# Patient Record
Sex: Male | Born: 1945
Health system: Southern US, Community
[De-identification: ages and names within clinical notes are randomized; demographics above are authoritative.]

## PROBLEM LIST (undated history)

## (undated) DIAGNOSIS — H35039 Hypertensive retinopathy, unspecified eye: Secondary | ICD-10-CM

## (undated) DIAGNOSIS — I739 Peripheral vascular disease, unspecified: Secondary | ICD-10-CM

## (undated) DIAGNOSIS — K449 Diaphragmatic hernia without obstruction or gangrene: Secondary | ICD-10-CM

## (undated) DIAGNOSIS — N183 Chronic kidney disease, stage 3 unspecified: Secondary | ICD-10-CM

## (undated) DIAGNOSIS — E782 Mixed hyperlipidemia: Secondary | ICD-10-CM

## (undated) DIAGNOSIS — K08109 Complete loss of teeth, unspecified cause, unspecified class: Secondary | ICD-10-CM

## (undated) DIAGNOSIS — H269 Unspecified cataract: Secondary | ICD-10-CM

## (undated) DIAGNOSIS — C801 Malignant (primary) neoplasm, unspecified: Secondary | ICD-10-CM

## (undated) DIAGNOSIS — R351 Nocturia: Secondary | ICD-10-CM

## (undated) DIAGNOSIS — I10 Essential (primary) hypertension: Secondary | ICD-10-CM

## (undated) DIAGNOSIS — C61 Malignant neoplasm of prostate: Secondary | ICD-10-CM

## (undated) DIAGNOSIS — I2510 Atherosclerotic heart disease of native coronary artery without angina pectoris: Secondary | ICD-10-CM

## (undated) DIAGNOSIS — E119 Type 2 diabetes mellitus without complications: Secondary | ICD-10-CM

## (undated) DIAGNOSIS — E785 Hyperlipidemia, unspecified: Secondary | ICD-10-CM

## (undated) HISTORY — DX: Hypertensive retinopathy, unspecified eye: H35.039

## (undated) HISTORY — DX: Atherosclerotic heart disease of native coronary artery without angina pectoris: I25.10

## (undated) HISTORY — PX: EYE SURGERY: SHX253

## (undated) HISTORY — DX: Essential (primary) hypertension: I10

## (undated) HISTORY — PX: CATARACT EXTRACTION: SUR2

## (undated) HISTORY — PX: CORONARY ARTERY BYPASS GRAFT: SHX141

## (undated) HISTORY — DX: Unspecified cataract: H26.9

## (undated) HISTORY — PX: APPENDECTOMY: SHX54

## (undated) HISTORY — PX: CARDIAC CATHETERIZATION: SHX172

## (undated) HISTORY — PX: VENTRAL HERNIA REPAIR: SHX424

## (undated) HISTORY — PX: CORONARY ANGIOPLASTY WITH STENT PLACEMENT: SHX49

## (undated) HISTORY — DX: Hyperlipidemia, unspecified: E78.5

---
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
## 1972-01-14 HISTORY — PX: INGUINAL HERNIA REPAIR: SUR1180

## 1972-01-14 HISTORY — PX: HERNIA REPAIR: SHX51

## 1997-08-13 DIAGNOSIS — Z955 Presence of coronary angioplasty implant and graft: Secondary | ICD-10-CM

## 1997-08-13 HISTORY — DX: Presence of coronary angioplasty implant and graft: Z95.5

## 1997-09-11 ENCOUNTER — Inpatient Hospital Stay (HOSPITAL_COMMUNITY): Admission: AD | Admit: 1997-09-11 | Discharge: 1997-09-13 | Payer: Self-pay | Admitting: Cardiology

## 1998-01-13 HISTORY — PX: CORONARY ARTERY BYPASS GRAFT: SHX141

## 1998-02-05 ENCOUNTER — Encounter: Payer: Self-pay | Admitting: Emergency Medicine

## 1998-02-05 ENCOUNTER — Inpatient Hospital Stay (HOSPITAL_COMMUNITY): Admission: EM | Admit: 1998-02-05 | Discharge: 1998-02-07 | Payer: Self-pay | Admitting: Emergency Medicine

## 1998-02-06 ENCOUNTER — Encounter: Payer: Self-pay | Admitting: Cardiology

## 1998-05-14 DIAGNOSIS — I252 Old myocardial infarction: Secondary | ICD-10-CM

## 1998-05-14 HISTORY — DX: Old myocardial infarction: I25.2

## 1998-06-02 ENCOUNTER — Encounter: Payer: Self-pay | Admitting: Emergency Medicine

## 1998-06-02 ENCOUNTER — Inpatient Hospital Stay (HOSPITAL_COMMUNITY): Admission: EM | Admit: 1998-06-02 | Discharge: 1998-06-10 | Payer: Self-pay | Admitting: Emergency Medicine

## 1998-06-05 ENCOUNTER — Encounter: Payer: Self-pay | Admitting: *Deleted

## 1998-06-06 ENCOUNTER — Encounter: Payer: Self-pay | Admitting: *Deleted

## 1998-06-06 DIAGNOSIS — Z951 Presence of aortocoronary bypass graft: Secondary | ICD-10-CM

## 1998-06-06 HISTORY — DX: Presence of aortocoronary bypass graft: Z95.1

## 1998-06-07 ENCOUNTER — Encounter: Payer: Self-pay | Admitting: Cardiothoracic Surgery

## 1998-06-08 ENCOUNTER — Encounter: Payer: Self-pay | Admitting: Cardiothoracic Surgery

## 1998-09-18 ENCOUNTER — Encounter: Payer: Self-pay | Admitting: Cardiothoracic Surgery

## 1998-09-18 ENCOUNTER — Inpatient Hospital Stay (HOSPITAL_COMMUNITY): Admission: RE | Admit: 1998-09-18 | Discharge: 1998-09-19 | Payer: Self-pay | Admitting: Cardiothoracic Surgery

## 1998-10-23 ENCOUNTER — Encounter: Payer: Self-pay | Admitting: Emergency Medicine

## 1998-10-23 ENCOUNTER — Inpatient Hospital Stay (HOSPITAL_COMMUNITY): Admission: EM | Admit: 1998-10-23 | Discharge: 1998-10-26 | Payer: Self-pay | Admitting: Emergency Medicine

## 1998-10-25 ENCOUNTER — Encounter: Payer: Self-pay | Admitting: *Deleted

## 1999-05-31 ENCOUNTER — Emergency Department (HOSPITAL_COMMUNITY): Admission: EM | Admit: 1999-05-31 | Discharge: 1999-05-31 | Payer: Self-pay

## 1999-05-31 ENCOUNTER — Encounter: Payer: Self-pay | Admitting: Emergency Medicine

## 1999-07-02 ENCOUNTER — Ambulatory Visit (HOSPITAL_COMMUNITY): Admission: RE | Admit: 1999-07-02 | Discharge: 1999-07-03 | Payer: Self-pay | Admitting: Cardiothoracic Surgery

## 1999-09-20 ENCOUNTER — Emergency Department (HOSPITAL_COMMUNITY): Admission: EM | Admit: 1999-09-20 | Discharge: 1999-09-20 | Payer: Self-pay | Admitting: Emergency Medicine

## 2000-08-30 ENCOUNTER — Inpatient Hospital Stay (HOSPITAL_COMMUNITY): Admission: EM | Admit: 2000-08-30 | Discharge: 2000-09-02 | Payer: Self-pay

## 2003-01-14 HISTORY — PX: CARDIAC CATHETERIZATION: SHX172

## 2003-04-15 ENCOUNTER — Inpatient Hospital Stay (HOSPITAL_COMMUNITY): Admission: EM | Admit: 2003-04-15 | Discharge: 2003-04-16 | Payer: Self-pay | Admitting: Emergency Medicine

## 2003-04-21 ENCOUNTER — Ambulatory Visit (HOSPITAL_COMMUNITY): Admission: RE | Admit: 2003-04-21 | Discharge: 2003-04-21 | Payer: Self-pay | Admitting: *Deleted

## 2003-11-16 ENCOUNTER — Ambulatory Visit: Payer: Self-pay | Admitting: Internal Medicine

## 2004-05-16 ENCOUNTER — Emergency Department (HOSPITAL_COMMUNITY): Admission: EM | Admit: 2004-05-16 | Discharge: 2004-05-16 | Payer: Self-pay | Admitting: Emergency Medicine

## 2004-10-02 ENCOUNTER — Ambulatory Visit: Payer: Self-pay | Admitting: Cardiology

## 2005-09-26 ENCOUNTER — Ambulatory Visit: Payer: Self-pay | Admitting: Cardiology

## 2006-05-19 ENCOUNTER — Observation Stay (HOSPITAL_COMMUNITY): Admission: EM | Admit: 2006-05-19 | Discharge: 2006-05-20 | Payer: Self-pay | Admitting: Emergency Medicine

## 2006-10-08 ENCOUNTER — Ambulatory Visit: Payer: Self-pay | Admitting: Cardiology

## 2007-09-13 ENCOUNTER — Ambulatory Visit: Payer: Self-pay | Admitting: Cardiology

## 2007-09-13 ENCOUNTER — Ambulatory Visit: Payer: Self-pay

## 2008-10-26 ENCOUNTER — Ambulatory Visit: Payer: Self-pay | Admitting: Cardiology

## 2008-10-26 DIAGNOSIS — I10 Essential (primary) hypertension: Secondary | ICD-10-CM | POA: Insufficient documentation

## 2008-10-26 DIAGNOSIS — I25810 Atherosclerosis of coronary artery bypass graft(s) without angina pectoris: Secondary | ICD-10-CM | POA: Insufficient documentation

## 2008-10-30 ENCOUNTER — Telehealth: Payer: Self-pay | Admitting: Cardiology

## 2008-10-31 ENCOUNTER — Telehealth: Payer: Self-pay | Admitting: Cardiology

## 2009-01-18 ENCOUNTER — Emergency Department (HOSPITAL_COMMUNITY): Admission: EM | Admit: 2009-01-18 | Discharge: 2009-01-18 | Payer: Self-pay | Admitting: Emergency Medicine

## 2009-01-18 IMAGING — CR DG CHEST 2V
2 series · 2 of 2 positions shown · non-contrast
Comparison: [DATE]

CLINICAL DATA: Chest pain.  Arrhythmia.

CHEST - 2 VIEW

[w chest pa]
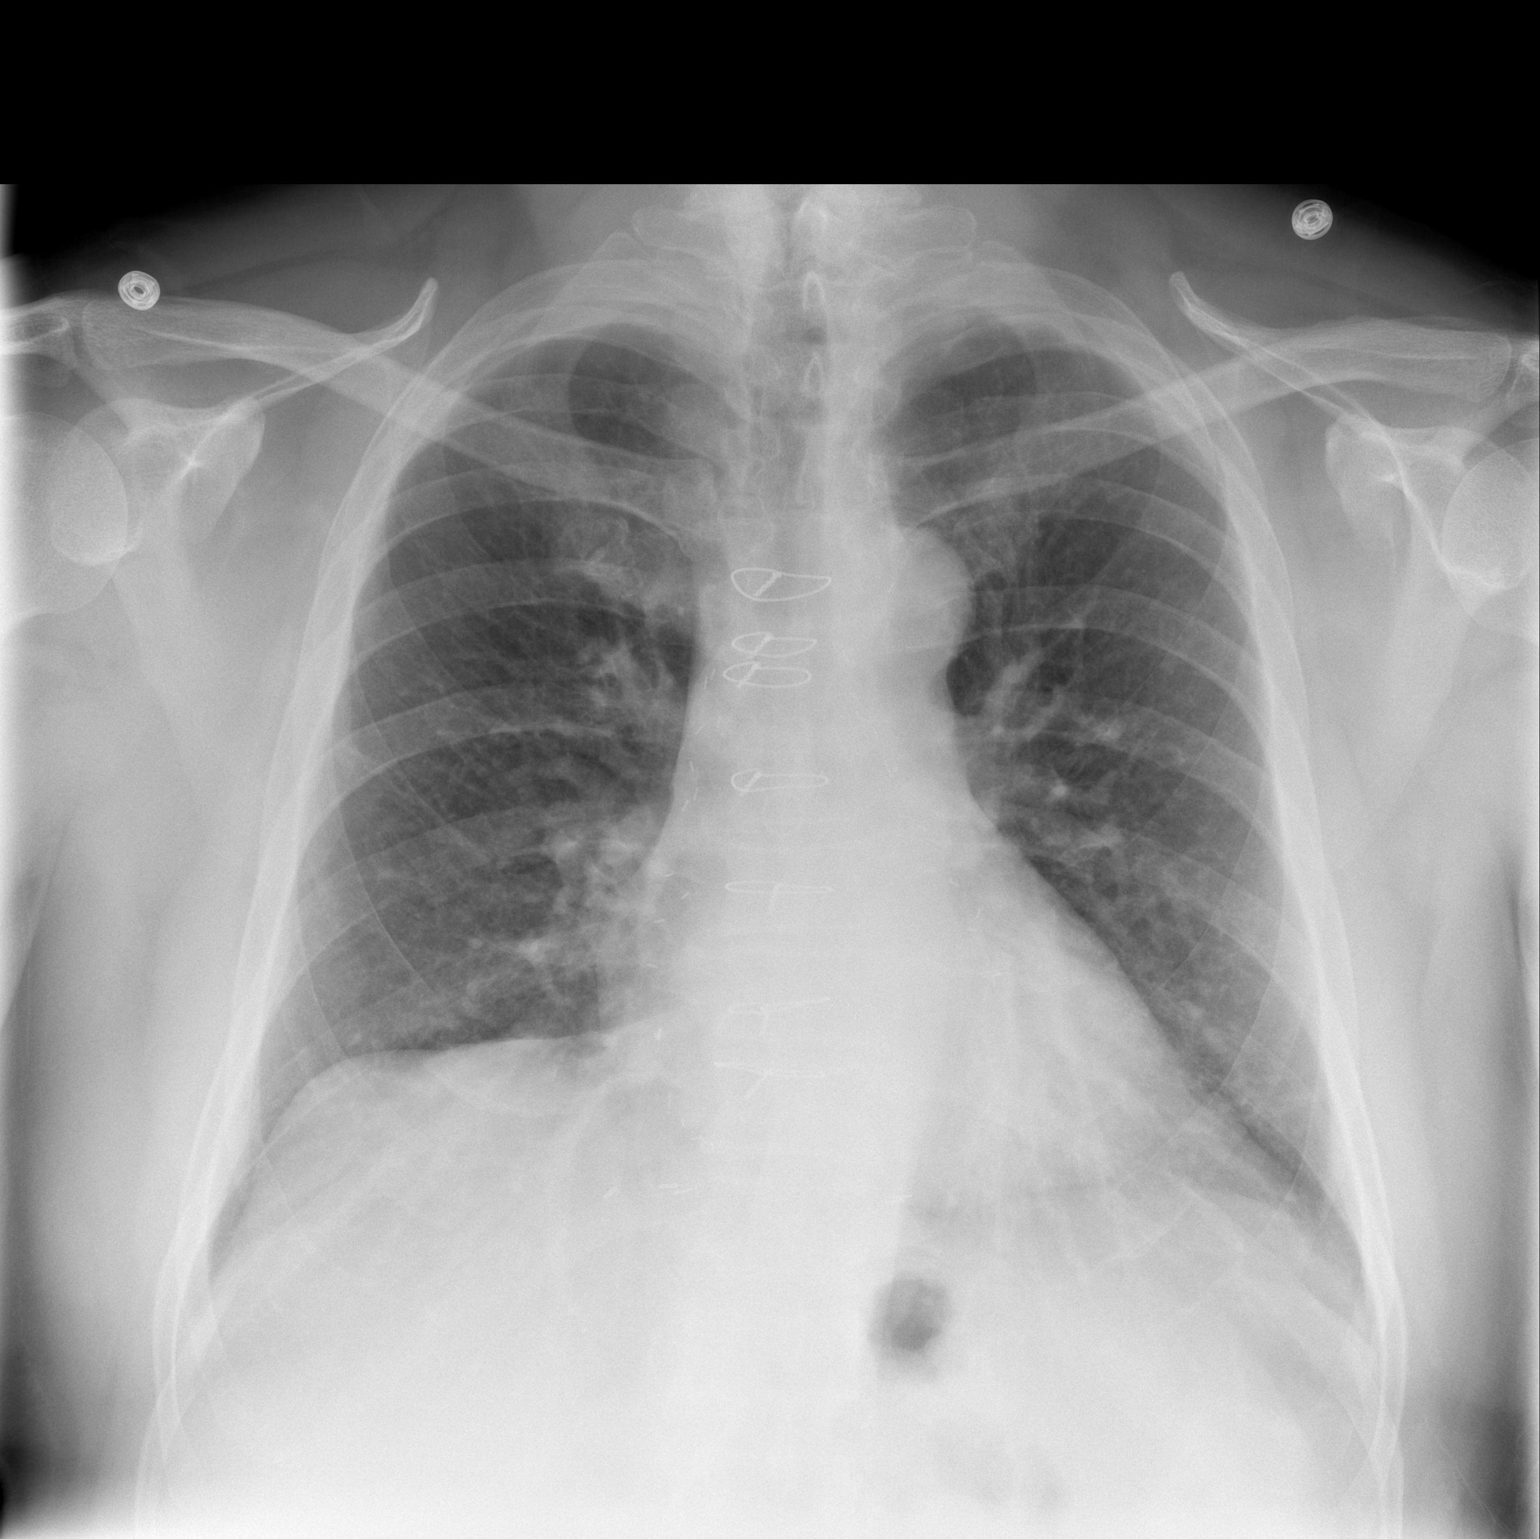

[w chest lat]
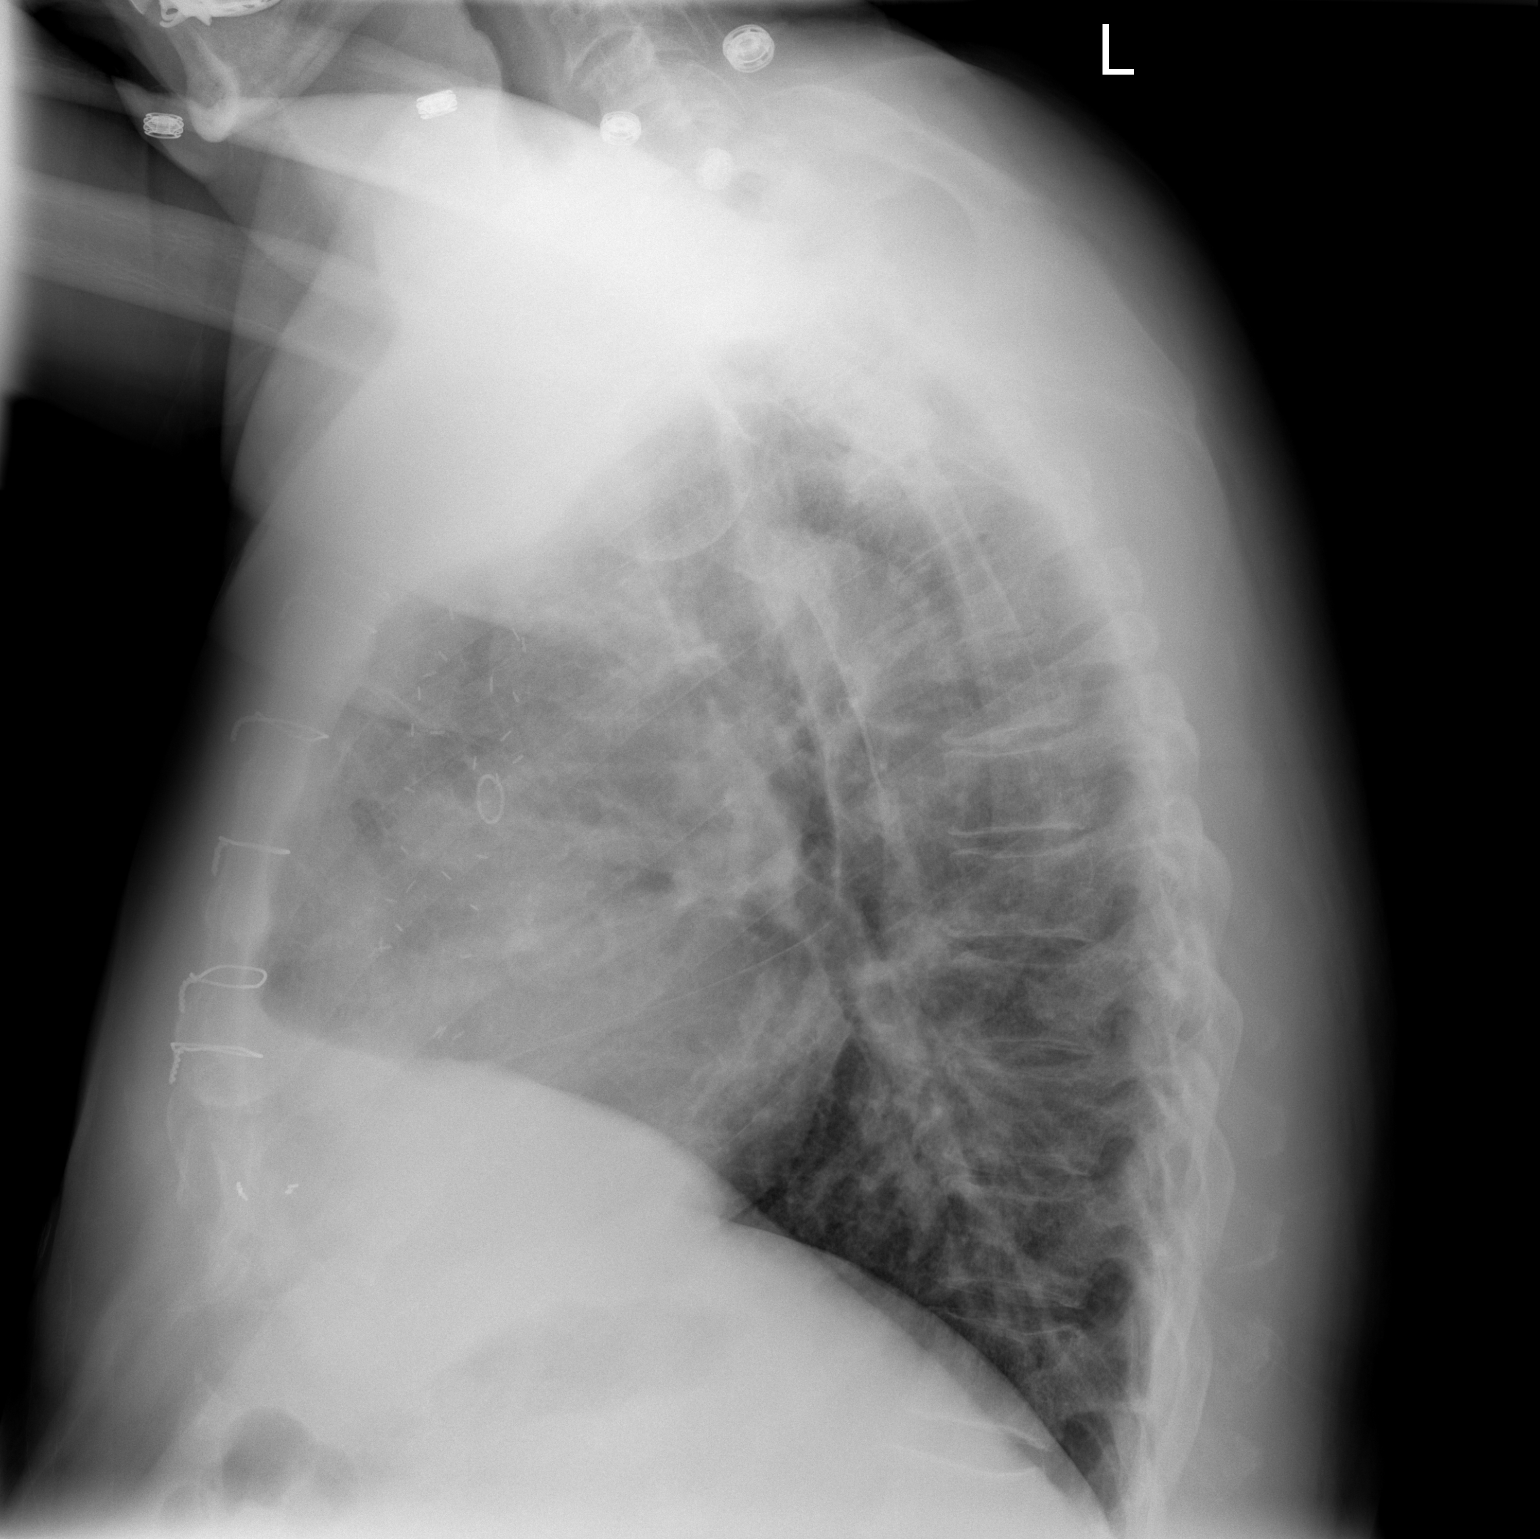

[2 of 2 positions shown; findings below may reference images not displayed]

FINDINGS: There is been previous median sternotomy and CABG.  Heart
size is at the upper limits of normal.  No sign of heart failure.
No infiltrate, mass, effusion or collapse.  No significant bony
finding.
IMPRESSION: Previous CABG.  No active disease.

## 2009-01-19 ENCOUNTER — Telehealth: Payer: Self-pay | Admitting: Cardiology

## 2009-01-22 ENCOUNTER — Encounter (INDEPENDENT_AMBULATORY_CARE_PROVIDER_SITE_OTHER): Payer: Self-pay | Admitting: *Deleted

## 2009-04-23 ENCOUNTER — Encounter: Payer: Self-pay | Admitting: Cardiology

## 2009-04-27 ENCOUNTER — Telehealth: Payer: Self-pay | Admitting: Cardiology

## 2009-11-05 ENCOUNTER — Ambulatory Visit: Payer: Self-pay | Admitting: Cardiology

## 2009-11-19 ENCOUNTER — Telehealth: Payer: Self-pay | Admitting: Cardiology

## 2010-02-12 NOTE — Progress Notes (Signed)
Summary: pt wants a sooner appt at gastro   Phone Note Call from Patient Call back at Home Phone [phone]   Caller: Patient Reason for Call: Talk to Nurse, Talk to Doctor Summary of Call: pt was seen yesterday at ER for chest pain but they told him he didn't have a heart attack but he needs to see a Laurette Schimke doc but the appt is not until Feb and he wants something sooner is there any way our office can help Initial call taken by: Omer Jack,  January 19, 2009 12:04 PM  Follow-up for Phone Call        Line busy.  Minerva Areola, RN, BSN  January 19, 2009 2:48 PM  Follow-up by: Minerva Areola, RN, BSN,  January 19, 2009 2:48 PM  Additional Follow-up for Phone Call Additional follow up Details #1::        Tried this number twice.  Both times a message came on saying this number [phone]) [phone] was no longer in service.   Minerva Areola, RN, BSN  January 19, 2009 2:59 PM  Additional Follow-up by: Minerva Areola, RN, BSN,  January 19, 2009 2:59 PM

## 2010-02-12 NOTE — Progress Notes (Signed)
Summary: Questions about labs and medications    Phone Note Call from Patient Call back at Home Phone [phone]   Caller: Patient Summary of Call: Pt calling regarding his  labs and medication from Dr.Moore office Initial call taken by: Judie Grieve,  April 27, 2009 4:49 PM  Follow-up for Phone Call        [facility]. Sherri Rad, RN, BSN  April 30, 2009 9:51 AM  I spoke with the pt. He states Dr. Kathi Der office had contacted him about his lipid panel. He was told his HDL was low and that he needed to start on something for this. I explained to the pt that we had received labs from 4/11 from Dr. Kathi Der office, but we did not have a lipid panel. I explained I would call [facility] for these results. I have called their office and left a message for medical records to call me and let me know when they are sending these results.  Follow-up by: Sherri Rad, RN, BSN,  April 30, 2009 4:06 PM  Additional Follow-up for Phone Call Additional follow up Details #1::        I received the pt's lipids from Dr. Kathi Der office. His HDL is 29. I have discussed this with Dr. Juanda Chance. He states that he supports the pt starting on something to help treat his HDL. We will leave that to Dr. Christell Constant. The pt is aware.  Additional Follow-up by: Sherri Rad, RN, BSN,  April 30, 2009 6:44 PM

## 2010-02-12 NOTE — Letter (Signed)
Summary: New Patient letter  [facility]  [address] [place], [place] [address]   Phone: [phone]  Fax: [phone]       01/22/2009 MRN: [id number]  Jacob Rios [address] [place], [place]  [address]  Dear Jacob Rios,  Welcome to the Gastroenterology Division at [facility].    You are scheduled to see Dr. Christella Hartigan on 02-06-09 at 9:00a.m. on the 3rd floor at [facility], [address]  We ask that you try to arrive at our office 15 minutes prior to your appointment time to allow for check-in.  We would like you to complete the enclosed self-administered evaluation form prior to your visit and bring it with you on the day of your appointment.  We will review it with you.  Also, please bring a complete list of all your medications or, if you prefer, bring the medication bottles and we will list them.  Please bring your insurance card so that we may make a copy of it.  If your insurance requires a referral to see a specialist, please bring your referral form from your primary care physician.  Co-payments are due at the time of your visit and may be paid by cash, check or credit card.     Your office visit will consist of a consult with your physician (includes a physical exam), any laboratory testing he/she may order, scheduling of any necessary diagnostic testing (e.g. x-ray, ultrasound, CT-scan), and scheduling of a procedure (e.g. Endoscopy, Colonoscopy) if required.  Please allow enough time on your schedule to allow for any/all of these possibilities.    If you cannot keep your appointment, please call [phone] to cancel or reschedule prior to your appointment date.  This allows [place] the opportunity to schedule an appointment for another patient in need of care.  If you do not cancel or reschedule by 5 p.m. the business day prior to your appointment date, you will be charged a $50.00 late cancellation/no-show fee.    Thank you for choosing [facility] for your medical needs.  We appreciate the opportunity to care for you.  Please visit [place] at our website  to learn more about our practice.                     Sincerely,                                                             The Gastroenterology Division

## 2010-02-12 NOTE — Assessment & Plan Note (Signed)
Summary: f1y  Medications Added ASPIRIN EC 325 MG TBEC (ASPIRIN) Take one tablet by mouth daily METOPROLOL SUCCINATE 100 MG XR24H-TAB (METOPROLOL SUCCINATE) Take one half tablet by mouth daily JANUVIA 50 MG TABS (SITAGLIPTIN PHOSPHATE) please take one tablet by mouth three times a week. VITAMIN C 500 MG TABS (ASCORBIC ACID) please take one tablet by mouth daily        Primary Provider:  Dr Christell Constant   History of Present Illness: Jacob Rios is 65 years old and return for management of CAD.  in 2000 had a microinfarction with subsequent bypass surgery. He's done quite well since that time has had no recent chest pain shortness of breath or palpitations.  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 He discontinued cigarettes in 2000.      Current Medications (verified): 1)  Lipitor 40 Mg Tabs (Atorvastatin Calcium) .... Take One Tablet By Mouth Daily. 2)  Fish Oil 1,000mg  .... 1 Tab Once Daily 3)  Aspirin Ec 325 Mg Tbec (Aspirin) .... Take One Tablet By Mouth Daily 4)  Metoprolol Succinate 100 Mg Xr24h-Tab (Metoprolol Succinate) .... Take One Half Tablet By Mouth Daily 5)  Glipizide 10 Mg Tabs (Glipizide) .Marland Kitchen.. 1 Tab Once Daily 6)  Januvia 50 Mg Tabs (Sitagliptin Phosphate) .... Please Take One Tablet By Mouth Three Times A Week. 7)  Vitamin C 500 Mg Tabs (Ascorbic Acid) .... Please Take One Tablet By Mouth Daily  Allergies: 1)  ! Morphine  Past History:  Past Medical History: Reviewed history from 10/26/2008 and no changes required.  PAST MEDICAL HISTORY:  1. Hypertension.  2. Hyperlipidemia  3. Diabetes. 4.  S/P CABG 2000  Review of Systems       ROS is negative except as outlined in HPI.   Vital Signs:  Patient profile:   65 year old male Height:      71 inches Weight:      260.50 pounds BP sitting:   132 / 72  Physical Exam  Additional Exam:  Gen.  Well-nourished, in no distress   Neck: No JVD, thyroid not enlarged, no carotid bruits Lungs: No tachypnea, clear without rales, rhonchi or wheezes Cardiovascular: Rhythm regular, PMI not displaced,  heart sounds  normal, no murmurs or gallops, no peripheral edema, pulses normal in all 4 extremities. Abdomen: BS normal, abdomen soft and non-tender without masses or organomegaly, no hepatosplenomegaly. MS: No deformities, no cyanosis or clubbing   Neuro:  No focal sns   Skin:  no lesions    Impression & Recommendations:  Problem # 1:  CAD, AUTOLOGOUS BYPASS GRAFT (ICD-414.02) He had bypass surgery in 2000. He's had no recent chest pain and this problem appears stable. His updated medication list for this problem includes:    Aspirin Ec 325 Mg Tbec (Aspirin) .Marland Kitchen... Take one tablet by mouth daily    Metoprolol Succinate 100 Mg Xr24h-tab (Metoprolol succinate) .Marland Kitchen... Take one half tablet by mouth daily  Orders: EKG w/ Interpretation (93000)  Problem # 2:  HYPERTENSION, BENIGN (ICD-401.1) This is well controlled on current medications. His updated medication list for this problem includes:    Aspirin Ec 325 Mg Tbec (Aspirin) .Marland Kitchen... Take one tablet by mouth daily    Metoprolol Succinate 100 Mg Xr24h-tab (Metoprolol succinate) .Marland Kitchen... Take one half tablet by mouth daily  Problem # 3:  HYPERLIPIDEMIA-MIXED (ICD-272.4) He had a recent lipid profile in Dr. Kathi Der office. His  total cholesterol and LDL were good but his HDL was 29. His weight is 260 pounds which is down somewhat from last year but still high. He is fairly active. We encouraged continued weight reduction. His updated medication list for this problem includes:    Lipitor 40 Mg Tabs (Atorvastatin calcium) .Marland Kitchen... Take one tablet by mouth daily.  Patient Instructions: 1)  Your physician recommends that you continue on your current medications as directed. Please refer to the Current Medication list given to you today. 2)  Your physician  wants you to follow-up in: 1 year with Dr. Clifton James.  You will receive a reminder letter in the mail two months in advance. If you don't receive a letter, please call our office to schedule the follow-up appointment. Prescriptions: JANUVIA 50 MG TABS (SITAGLIPTIN PHOSPHATE) please take one tablet by mouth three times a week.  #30 x 3   Entered by:   Sherri Rad, RN, BSN   Authorized by:   Lenoria Farrier, MD, [facility]   Signed by:   Sherri Rad, RN, BSN on 11/05/2009   Method used:   [facility] to        [facility] [phone]* (retail)       [address] [place].       [place], [place]  [address]       Ph: [phone] or [phone]       Fax: [phone]   RxID:   [id number] GLIPIZIDE 10 MG TABS (GLIPIZIDE) 1 tab once daily  #30 x 3   Entered by:   Sherri Rad, RN, BSN   Authorized by:   Lenoria Farrier, MD, [facility]   Signed by:   Sherri Rad, RN, BSN on 11/05/2009   Method used:   Electronically to        [facility] [phone]* (retail)       [address] [place].       [place], [place]  [address]       Ph: [phone] or [phone]       Fax: [phone]   RxID:   [id number] METOPROLOL SUCCINATE 100 MG XR24H-TAB (METOPROLOL SUCCINATE) Take one half tablet by mouth daily  #30 x 11   Entered by:   Sherri Rad, RN, BSN   Authorized by:   Lenoria Farrier, MD, [facility]   Signed by:   Sherri Rad, RN, BSN on 11/05/2009   Method used:   Electronically to        [facility]. [phone]* (retail)       [address] [place].       [place], [place]  [address]       Ph: [phone] or [phone]       Fax: [phone]   RxID:   [id number] LIPITOR 40 MG TABS (ATORVASTATIN CALCIUM) Take one tablet by mouth daily.  #30 x 11   Entered by:   Sherri Rad, RN, BSN   Authorized by:   Lenoria Farrier, MD, [facility]   Signed by:   Sherri Rad, RN, BSN on 11/05/2009   Method used:   Electronically to        [facility]. [phone]* (retail)       [address] [place].       [place],  [place]  [address]       Ph: [phone] or [phone]       Fax: [phone]   RxID:   [id number]

## 2010-02-12 NOTE — Progress Notes (Signed)
Summary: QUESTION ABOUT MEDICATION  Medications Added GLIPIZIDE XL 10 MG XR24H-TAB (GLIPIZIDE) take one tablet by mouth once daily       Phone Note From Pharmacy Call back at [phone]   [facility]: [facility] [phone]* Summary of Call: PHARMACY HAVE QUESTIONS ABOUT MEDICATION GLIPIZIDE Initial call taken by: Judie Grieve,  November 19, 2009 2:09 PM  Follow-up for Phone Call        I called and spoke with the pharmacy. They stated he had been getting the Glipizide XL and we sent in the short acting. I explained that we normally don't fill this for him. I gave the ok to fill what he normally has been getting. I will correct this in his chart. Follow-up by: Sherri Rad, RN, BSN,  November 19, 2009 2:25 PM    New/Updated Medications: GLIPIZIDE XL 10 MG XR24H-TAB (GLIPIZIDE) take one tablet by mouth once daily

## 2010-03-30 LAB — POCT I-STAT, CHEM 8
BUN: 19 mg/dL (ref 6–23)
Creatinine, Ser: 1.1 mg/dL (ref 0.4–1.5)
Hemoglobin: 15.6 g/dL (ref 13.0–17.0)
Potassium: 4.8 mEq/L (ref 3.5–5.1)
Sodium: 134 mEq/L — ABNORMAL LOW (ref 135–145)
TCO2: 28 mmol/L (ref 0–100)

## 2010-03-30 LAB — POCT CARDIAC MARKERS: Troponin i, poc: <0.05 ng/mL (ref 0.00–0.09)

## 2010-03-30 LAB — GLUCOSE, CAPILLARY: Glucose-Capillary: 225 mg/dL — ABNORMAL HIGH (ref 70–99)

## 2010-05-28 NOTE — Assessment & Plan Note (Signed)
[facility]                            CARDIOLOGY OFFICE NOTE   Jacob Rios, Jacob Rios                       MRN:          [id number]  DATE:09/13/2007                            DOB:          1945/07/17    PRIMARY CARE PHYSICIAN:  Jacob Penna, MD in [place].   CLINICAL HISTORY:  [name] is a 65 years old and returned for followup  management of coronary heart disease.  He had prior percutaneous  coronary interventions and had bypass surgery in 2000.  He has done very  well since that time and said he has had no recent chest pain, shortness  breath or palpitations.  He is still working part time as a Ecologist.   His past medical history is significant for diabetes, hypertension,  hyperlipidemia, and excess weight, although he has lost weight over the  last couple of years from 274 down to 264.   His current medications include Lipitor 40 mg, Nexium, aspirin,  glipizide, and Toprol 100 one half tablet daily.   PHYSICAL EXAMINATION:  VITAL SIGNS:  Blood pressure 139/81, the pulse 71  and regular.  NECK:  There was no venous distention.  The carotid pulses were full  without bruits.  CHEST:  Clear.  CARDIAC:  Rhythm was regular.  No murmurs or gallops.  ABDOMEN:  Soft.  No organomegaly.  EXTREMITIES:  Peripheral pulses were full.  There is no peripheral  edema.   Jacob Rios had an adenosine Myoview scan today and his scan now showed no  evidence of ischemia.   IMPRESSION:  1. Coronary artery disease, status post coronary bypass graft surgery      in 2000, stable.  2. Good left ventricular function.  3. Hypertension, treated.  4. Hyperlipidemia with low HDL.  5. Non-insulin dependant diabetes.  6. Excess weight.   RECOMMENDATIONS:  I think, Jacob Rios is doing well from a cardiac  standpoint.  He is making some progress on his weight.  His HDL was low  on his last reading and I talked to him about low carbs and weight  reduction to  help with this as well as exercise.  His LDLs target on  Lipitor and we will continue that.  I will plan to see him back in  followup in a year.     Bruce Elvera Lennox Juanda Chance, MD, [facility]  Electronically Signed   BRB/MedQ  DD: 09/13/2007  DT: 09/14/2007  Job #: [id number]

## 2010-05-28 NOTE — Assessment & Plan Note (Signed)
[facility]                            CARDIOLOGY OFFICE NOTE   [name], Rios                         MRN:          [id number]  DATE:10/08/2006                            DOB:          12/06/1945    PRIMARY CARE PHYSICIAN:  Jacob Rios, M.D.   CLINICAL HISTORY:  [name] is 65 years old and has had prior  percutaneous coronary interventions and had a bypass surgery in 2000.  He has done quite well since that time.  He has had no recent chest  pain, shortness of breath, or palpitations.  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  He has had no palpitations and no  shortness of breath.   PAST MEDICAL HISTORY:  1. Diabetes.  2. Hypertension.  3. Hyperlipidemia.  4. Excess weight gain, most of which occurred right after his surgery      when he stopped smoking.   PHYSICAL EXAMINATION:  NECK:  There was no vein distension.  The carotid  pulses were full without bruits.  CHEST:  Clear.  CARDIAC:  Rhythm was regular.  I could hear no murmurs or gallops.  ABDOMEN:  Soft with normal bowel sounds.  There was no  hepatosplenomegaly.  EXTREMITIES:  Peripheral pulses were full with no peripheral edema.   Electrocardiogram showed small Q wave in III and was otherwise normal,  except for 1 PVC.   IMPRESSION:  1. Coronary artery disease status post prior percutaneous coronary      intervention and status post coronary artery bypass graft surgery      in 2000, now stable.  2. Hyperlipidemia with low high-density lipoprotein.  3. Hypertension, treated.  4. Non-insulin-dependent diabetes.  5. Excess weight.   RECOMMENDATIONS:  I think [name] is doing well from a cardiac  standpoint.  His biggest issue is his excess weight up to 270 pounds.  I  talked to him about a low carb diet and  encouraged him strongly to lose  weight.  I told him to set an initial goal to get down to 250 over the  next 3-6 months and then he can work from there.  It has been 8 years  since bypass  surgery, and we will plan to have an adenosine rest/stress Myoview scan  prior to his next visit.  I will plan to see him back in a year.     Bruce Elvera Lennox Juanda Chance, MD, [facility]  Electronically Signed    BRB/MedQ  DD: 10/08/2006  DT: 10/09/2006  Job #: [id number]

## 2010-05-31 NOTE — Discharge Summary (Signed)
NAME:  Jacob Rios, Jacob Rios                          ACCOUNT NO.:  [id number]   MEDICAL RECORD NO.:  [id number]                   PATIENT TYPE:  INP   LOCATION:  2036                                 FACILITY:  [facility]   PHYSICIAN:  Theodore Demark, P.A. [facility]            DATE OF BIRTH:  [birth year]   DATE OF ADMISSION:  DATE OF DISCHARGE:  04/16/2003                                 DISCHARGE SUMMARY   PROCEDURES:  None.   HOSPITAL COURSE:  Jacob Rios is a 65 year old male with a history of  aortocoronary bypass surgery in 2000.  His last catheterization was in 2002.  At that time, the LIMA was atretic with 50% stenosis in the LAD, and the  RAMI had a 40% stenosis in the distal portion, but was patent to the RCA,  and the SVG to diagonal was patent as well.  Medical therapy was  recommended.  Jacob Rios began having chest pain described as an indigestion  and burning similar to his reflux but also similar to his anginal symptoms.  He took sublingual nitroglycerin and Nexium and got relief of his symptoms.  He was admitted to rule out MI and for further evaluation.   His enzymes were negative for myocardial infarction, and he was scheduled  for an in-hospital Cardiolite on April 16, 2003.  However, he was mistakenly  injected with a mislabeled technetium MAA instead of technetium sestamibi.  Therefore, we were unable to perform the Cardiolite in the hospital.   Dr. Myrtis Ser evaluated Jacob Rios and felt that with resolution of his symptoms,  he could be discharged and have a followup Cardiolite as an outpatient.  Additionally, he had bradycardia with a heart rate that dropped into the  40's occasionally, but this was not sustained.  His heart rate was generally  in the 50's when he was awake, and he was asymptomatic with this, and no  medicine changes were made.  Jacob Rios was considered stable for discharge  on April 16, 2003.   LABORATORY DATA:  Hemoglobin 15.1, hematocrit 44.3, WBC's 5.3, platelets  182, Sodium 138, potassium 4.0, chloride 106.  CO2 25.  BUN 15, creatinine  1.4, glucose 116.   DISCHARGE CONDITION:  Stable.   DISCHARGE DIAGNOSES:  1. Chest pain, enzymes negative for myocardial infarction and Cardiolite as     an outpatient to evaluate for ischemia.  2. Status post myocardial infarction in 1999 with percutaneous intervention.  3. Status post bypass surgery in 2000 with LIMA to LAD and RAMI to RCA and     SVG to diagonal.  4. Status post catheterization in 2002 with RAMI and SVG patent, and the     LIMA was open but atretic.  5. Hyperlipidemia.  6. Gastroesophageal reflux disease.  7. Status post incisional hernia and appendectomy.  8. History of allergies to contrast and morphine.   DISCHARGE INSTRUCTIONS:  1. His activity level is to be  as tolerated.  2. He is to stick to a low-fat diet.  3. He is to get a stress test.  4. Follow up with Dr. Gerri Spore.  The office will call.  5. He is to follow up with Dr. Christell Constant as needed or as scheduled.   DISCHARGE MEDICATIONS:  1. Toprol XL 100 mg daily.  2. Aspirin 325 mg daily.  3. Fish oil daily.  4. Lipitor 40 mg daily.  5. Nitroglycerin p.r.n.                                                Theodore Demark, P.A. [facility]    RB/MEDQ  D:  04/16/2003  T:  04/17/2003  Job:  [id number]   cc:   Ernestina Penna, M.D.  [address] [place]  [place] [address]  Fax: [phone]   Carole Binning, M.D. [facility]

## 2010-05-31 NOTE — Op Note (Signed)
[place]  Patient:    Jacob Rios, Jacob Rios                       MRN: [id number] Proc. Date: 07/02/99 Adm. Date:  [id number] Disc. Date: [id number] Attending:  Waldo Laine CC:         Gwenith Daily. Tyrone Sage, M.D.                           Operative Report  PREOPERATIVE DIAGNOSIS:  Recurrent ventral hernia repair, status post coronary artery bypass grafting.  POSTOPERATIVE DIAGNOSIS:  Recurrent ventral hernia repair, status post coronary artery bypass grafting.  SURGICAL REPAIR:  Repair of ventral hernia with polypropylene mesh.  SURGEON:  Gwenith Daily. Tyrone Sage, M.D.  BRIEF HISTORY:  The patient is a 65 year old male who approximately one year ago underwent coronary artery bypass grafting with bilateral mammaries.  He has done well from a cardiac standpoint recently without recurrent angina.  In the fall of 2000, he developed a ventral hernia at the base of his sternal incision.  A repair at that time was done primarily.  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  There had been a question of slight recurrence of the hernia prior to this, but after the lawn mower accident, this became a much more prominent bulge at the lower end of his sternal incision with increasing discomfort and tenderness.  On physical examination, an easily palpable defect was appreciated, and repair was recommended.  DESCRIPTION OF PROCEDURE:  The patient was underwent general endotracheal anesthesia without incident.  The skin of the abdomen and lower chest was prepped with Betadine and draped in the usual sterile manner.  The previous incision over the lower sternum and upper abdomen was opened, and dissection was carried down to the fascial layers.  There was an easily identifiable inch in size defect just slightly to the right of the midline and below the xiphoid.  This area was freed up to identify fascial layers.  A  polypropylene mesh was selected in a double layer, was secured to the fascia to close the defect with interrupted Prolene sutures.  Vicryl 0 sutures were then used to reapproximate the subcutaneous tissue and a 4-0 subcuticular stitch in the skin, and dry dressings were applied.  The patient was extubated in the operating room and transferred to the recovery room, having tolerated the procedure without obvious complications.  Blood loss was minimal. DD:  07/02/99 TD:  07/04/99 Job: [id number]

## 2010-05-31 NOTE — H&P (Signed)
[facility]  Patient:    Jacob Rios, Jacob Rios                       MRN: [id number] Adm. Date:  [id number] Disc. Date: [id number] Attending:  Trauma, Md Dictator:   Marlowe Kays, P.A.                         History and Physical  CHIEF COMPLAINT:  Femoral/popliteal occlusive disease with claudication symptoms.  HISTORY OF PRESENT ILLNESS:  This 65 year old white male referred by Dr. Titus Dubin. Hopper for evaluation of bilateral lower extremity claudication.  The symptoms appeared three months ago.  These symptoms are now increasingly worse, more pronounced on the left than on the right.  Accompanying symptoms are night pain, numbness, and tingling on the feet, more pronounced on the left than on the right. Severe claudication symptoms appear after he walks about 100 yards.  No rest pain or nonhealing ulcers.  Dopplers at the office revealed severe occlusive disease in both lower extremities, with calcified vessels.  Also, he has evidence of left superficial femoral, versus popliteal occlusive disease, as well as bilateral tibial disease.  The patient will be admitted on June 29, 1999, to [facility] for heparinization, for the patient is off of Coumadin since  June 27, 1999, and he has a history of valve replacement.  He will undergo an arteriogram on July 01, 1999, and possibly a left femoral/popliteal bypass graft on July 02, 1999.  PAST MEDICAL HISTORY: 1. Coronary artery disease, status post CABG, valve replacement. 2. PVOD, claudication. 3. Chronic Coumadin therapy, secondary to valve replacement. 4. Hypertension.  PAST SURGICAL HISTORY: 1. Status post CABG x 3 and aortic valve replacement in 1987, Dr. Barton Fanny,    no records. 2. Status post laminectomy on March 19, 1993. 3. Status post cataract surgery on April 07, 1993.  CURRENT MEDICATIONS: 1. Coumadin 5 mg p.o. q.d. (off since June 27, 1999). 2. Cardizem 240 mg p.o.  q.d. 3. Zestoretic 20/12.5 mg p.o. q.d. 4. Mentax 1% cream. 5. Zinc, B6 two p.o. q.d. 6. Extra strength Tylenol p.r.n. pain.  ALLERGIES:  ASPIRIN AND CODEINE.  REVIEW OF SYSTEMS:  See the HPI and past medical history for significant positives. The patient denies any diabetes mellitus, kidney disease, TIA, syncope, or presyncope.  He did have one episode about two to three weeks ago of blurred vision for about 15-20 seconds, without recurrence.  No angina, no myocardial infarction. No congestive heart failure.  FAMILY HISTORY:  Noncontributory.  SOCIAL HISTORY:  He is married for 47 years, with two children.  Retired.  He quit in 1986, at a daily job.  He leads an active life and walks daily.  He denies any alcohol intake.  PHYSICAL EXAMINATION:  GENERAL:  A well-developed, well-nourished 65 year old white male, in no acute distress, alert and oriented x 3.  VITAL SIGNS:  Blood pressure 150/80, pulse 64, respirations 18.  HEENT:  Head normocephalic, atraumatic.  PERRLA.  EOMI.  Normal funduscopic examination.  NECK:  Supple, no jugular venous distention, bruits, thyromegaly, or lymphadenopathy.  CHEST:  Symmetrical on inspiration.  LUNGS:  Clear to auscultation.  There is a trace of rhonchi bilaterally.  No rales.  NODES:  No axillary or supraclavicular lymphadenopathy.  LUNGS:  Respirations unlabored.  CARDIOVASCULAR:  A regular rate and rhythm, with a 1/6 systolic murmur, and increased valve sounds.  No rubs or  gallops.  ABDOMEN:  Soft, nontender.  Bowel sounds x 4.  No hepatosplenomegaly, masses palpable, or abdominal bruits.  GENITOURINARY:  Deferred.  RECTAL:  Deferred.  EXTREMITIES:  No clubbing.  There is left dependent rubor and no edema.  SKIN:  No ulcerations.  Normal hair pattern and warm temperature.  PERIPHERAL PULSES:  Carotids 2+ bilaterally without bruits.  Femoral 2+ bilaterally without bruits.  Popliteal, dorsalis pedis, and posterior  tibialis absent on the left, 2+ on the right.  NEUROLOGIC:  Grossly normal.  The patient leans to the left.  Deep tendon reflexes 2+ bilaterally.  Muscle strength 5/5.  ASSESSMENT/PLAN:  A 65 year old white male with a significant history, who will  undergo an arteriogram in the cardiac catheterization laboratory on July 01, 1999, and the day of surgery will take place on July 02, 1999, where a left femoral/popliteal bypass graft will take place.  Dr. Di Kindle. Edilia Bo has seen and evaluated this patient prior to the admission, and has explained the risks and benefits involving the procedure, and the patient has agreed to continue.DD:  06/28/99 TD:  06/28/99 Job: [id number]

## 2010-05-31 NOTE — H&P (Signed)
NAME:  JANET, DECESARE                          ACCOUNT NO.:  [id number]   MEDICAL RECORD NO.:  [id number]                   PATIENT TYPE:  INP   LOCATION:  2036                                 FACILITY:  [facility]   PHYSICIAN:  Marrian Salvage. Freida Busman, M.D. [facility]            DATE OF BIRTH:  1945-12-24   DATE OF ADMISSION:  04/15/2003  DATE OF DISCHARGE:  04/16/2003                                HISTORY & PHYSICAL   ADMITTING CARDIOLOGIST:  Dr. Gerri Spore   CHIEF COMPLAINT:  Chest pain.   HISTORY OF PRESENT ILLNESS:  The patient is a 65 year old man with a history  of coronary artery disease, status post myocardial infarction in 1999 with  percutaneous coronary intervention then.  He subsequently underwent a  coronary artery bypass grafting in 2000 with a LIMA to LAD, RIMA to RCA, and  SVG to diag for 2-vessel disease.  He has also had some severe GERD in the  past which has been relatively well controlled with a proton pump inhibitor.   The patient was in his usual state of health until today.  He ate a big  breakfast of greasy tenderloin, which he does not typically do.  A few hours  later at 10 a.m., he had the onset of indigestion and burning in his  epigastrium, similar to his GERD but more severe than usual, and also  similar to the anginal symptoms he had in 1999 with his myocardial  infarction.  He had no associated shortness of breath, no radiation, no  nausea or vomiting.  The severity was 6 out of 10.  He took nitroglycerin  sublingually x 2 as well as an extra Nexium.  His chest pain improved and  had totally resolved by about 2 p.m.  He was concerned given its severity  and consequently presented to [facility] Emergency Department.  In the  emergency department, vital signs were stable, cardiac markers were  negative, EKG was negative, and the patient was pain-free.  He was admitted  for further evaluation and rule-out of cardiac ischemia.   PAST MEDICAL HISTORY:  1. Coronary artery  disease.  Status post MI in September of 1999 with     percutaneous coronary intervention to unknown vessel at that time.     Followed in May of 2000 with coronary artery bypass grafting x 3 with     LIMA to LAD, RIMA to RCA, SVG to diag.  His last catheterization was on     September 01, 2000, showing 50% LAD lesion, 100% D-1 lesion, relatively     normal left circ, and 100% occluded mid occlusion in the RCA.  His LIMA     to LAD was open but atretic.  RIMA to RCA was patent with a 40% distal in-     graft stenosis.  His SVG to the diagonal was patent.  2. Hyperlipidemia.  3. GERD.  4. Incisional hernia.  5. Status post appendectomy.  6. Status post inguinal hernia repair.   ALLERGIES:  1. Possible IV contrast allergy.  2. Morphine causes swelling.   MEDICATIONS:  1. Toprol XL 100 mg daily.  2. Aspirin 325 mg daily.  3. Nexium 40 mg daily.  4. Fish oil 1 capful daily.  5. Lipitor 40 mg daily.  6. Nitroglycerin sublingually p.r.n.   SOCIAL HISTORY:  Patient lives in [place] with his wife.  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  He smoked 4  packs a day for 45 years but quit in 1999.  He has no significant alcohol or  drug use history.  He remains quite active.   FAMILY HISTORY:  His mother died of lung cancer at age 19, and father died  of lymphoma at age 22.  No early CAD in his family.   REVIEW OF SYSTEMS:  Revealed in detail.  Positive for chest pain, GERD  symptoms, and abdominal pain.  Otherwise negative in detail, including  shortness of breath, palpitations, edema, syncope, claudication, bright red  blood per rectum.   ADVANCE DIRECTIVE:  Patient is full code.   PHYSICAL EXAMINATION:  VITAL SIGNS:  Temperature 98.0, pulse 65,  respirations 20, blood pressure 146/77.  Saturation 97% on room air.  GENERAL:  Patient was in no apparent distress, conversant and pleasant.  HEENT:  His dentition was normal with normal oral mucosa.   NECK:  No thyromegaly.  No carotid bruits.  No jugular venous distention.  No lymphadenopathy.  HEART:  Regular rate and rhythm with normal S1 and S2, and no appreciable  murmur or gallop.  LUNGS:  Clear to auscultation bilaterally.  SKIN:  Without rash.  ABDOMEN:  Soft and nontender.  EXTREMITIES:  Showed 2+ femoral pulse with 2+ PT pulses.  No significant  edema.  NEUROLOGIC:  He was alert and oriented with grossly intact neurologic exam.   Chest x-ray normal per report.  EKG:  Sinus brady at rate of 58 with normal  axis, normal intervals, no Q waves, and isolated T wave inversion in V1,  which was all unchanged from prior EKG, August 30, 2000.  Hematocrit 46,  sodium 139, potassium 4.0, BUN 21, creatinine 1.7, glucose 109, CK 161, MB  2.2, troponin I is less than 0.05.   A 65 year old man with history of coronary artery bypass graft, negative  catheterization three years with patent stents, 2-vessel disease, who also  has known gastroesophageal reflux disease.  He presents with his anginal  equivalent but given the history, this would most likely be his  gastroesophageal reflux disease.  However, with his coronary anatomy and  three years since his last test, we should definitely consider ischemia and  rule it out.   1. Chest pain.  Admit to a telemetry bed.  Serial cardiac markers and EKG.     Continue aspirin, beta blocker and statin.  If his blood pressure is     persistently greater than 130/75, we will add an ACE inhibitor.  Given     the relatively low suspicion for ACS, we will not given heparin at this     time.  We will plan for exercise Cardiolite in the a.m.  2. Hypertension.  Elevated on single measurement.  Beta blocker is limited     by heart rate.  If persistently up, we will add hydrochlorothiazide or an     ACE inhibitor.   DISPOSITION:  Discharge  to home if Cardiolite is negative and remaining workup and evaluation remain benign.                                                 Marrian Salvage Freida Busman, M.D. [facility]    LAA/MEDQ  D:  04/16/2003  T:  04/17/2003  Job:  [id number]

## 2010-05-31 NOTE — H&P (Signed)
Jacob Rios, LAURENT NO.:  [id number]   MEDICAL RECORD NO.:  [id number]          PATIENT TYPE:  INP   LOCATION:  4729                         FACILITY:  [facility]   PHYSICIAN:  Altha Harm, MDDATE OF BIRTH:  May 09, 1945   DATE OF ADMISSION:  05/19/2006  DATE OF DISCHARGE:                              HISTORY & PHYSICAL   CHIEF COMPLAINT:  Bright red blood per rectum.   HISTORY OF PRESENT ILLNESS:  This is a 65 year old Caucasian gentleman  who states that while going to the bathroom today he noticed that he had  some blood mixed in with his stool.  The patient describes the blood as  bright red, coloring the toilet water pink, and he noticed some drops of  blood splattered against the back of the toilet bowl.  The patient  states that his stool was a normal color, and he denies any blackish-  colored or melanotic-colored stool.  The patient denies any dizziness.  He denies any loss of consciousness,  seizure disorder.  He denies any  chest pain, palpitations, nausea or vomiting, or abdominal pain.  The  patient does admit to having hemorrhoids.  He states that he usually has  a lot of pain with them.  The patient states that he has never had  rectal bleeding before.   PAST MEDICAL HISTORY:  Significant for coronary artery disease.  The  patient has had a CABG x3 in the past.  He has had diabetes type 2,  hyperlipidemia, and gastroesophageal reflux disease.   FAMILY HISTORY:  Significant for hypertension and diabetes type 2.   SOCIAL HISTORY:  The patient works as a Radio producer.  He resides  with his wife.  He denies any alcohol, tobacco or drug use.   ALLERGIES:  MORPHINE SULFATE.   CURRENT MEDICATIONS:  Include:  1. Toprol-XL 50 mg p.o. daily.  2. Nexium 40 mg p.o. daily.  3. Aspirin 325 mg p.o. daily.  4. Glipizide 10 mg p.o. daily.  5. Lipitor 40 mg p.o. nightly.   PRIMARY CARE PHYSICIAN:  Dr. Rudi Heap in [facility] [place].   REVIEW OF SYSTEMS:  A 14-systems review shows all systems were negative,  except as noted in the HPI.   EMERGENCY ROOM COURSE:  Patient had several studies done.  However, on  his presentation he had a blood pressure of 125/71.  Heart rate of 61.  Respiratory rate of 18.  Sats 93% on room air.  White blood cell count  5.9, hemoglobin 15.1, hematocrit 44, platelet count 197.  Sodium 137,  potassium 4.5, chloride of 106, bicarb 24, BUN 17, creatinine 1.28.   PHYSICAL EXAMINATION:  GENERAL:  On my examination the patient is  actually up, walking around the room.  He had just gone to the bathroom.  He said he has had no further blood in his stool.  VITAL SIGNS:  Blood pressure is 130/62.  Heart rate 58.  Respiratory  rate 18.  Sats are 94% on room air.  HEENT EXAMINATION:  Patient is normocephalic and  atraumatic.  Pupils are  equal, round, and reactive to light and accommodation.  Extraocular  movements are intact.  Tympanic membranes are translucent, with good  landmarks.  There is no conjunctival pallor noted.  There is no icterus  noted.  No nasal polyps noted.  Oral mucosa is moist.  No exudate,  erythema, or lesions are noted.  NECK EXAMINATION:  Trachea is midline.  No masses.  No thyromegaly.  No  JVD.  No carotid bruit.  RESPIRATORY EXAMINATION:  Patient has normal respiratory effort.  Good  excursion bilaterally.  Clear to auscultation.  CARDIOVASCULAR EXAMINATION:  Patient has a healed midline scar status  post CABG.  He has a normal S1 and S2.  No murmurs, rubs or gallops are  noted.  PMI is nondisplaced.  No heaves or thrills on palpation.  ABDOMINAL EXAMINATION:  Abdomen is soft, nontender, nondistended.  No  masses.  No hepatosplenomegaly.  Bowel sounds are normoactive.  LYMPH NODE:  There is no cervical, axillary, or inguinal lymphadenopathy  noted.  MUSCULOSKELETAL:  Patient has no warmth, swelling, or erythema around  the joints.   NEUROLOGICAL:  Cranial nerves II-XII are grossly intact, with no focal  neurological deficits.  The DTRs are bilaterally, upper and lower  extremities.  Sensation is intact to light touch, pinprick, and  proprioception.  Strength is 5/5 bilaterally, upper and lower  extremities.  PSYCHIATRIC:  Patient is alert and oriented x3.  He has normal cognition  and insight, and he has good recent and remote recall.   ASSESSMENT AND PLAN:  This is a patient who presents with rectal  bleeding.  It is unclear as to whether or not this is a significant GI  bleed, or if this is just from hemorrhoids.  However, at this point the  patient's hemoglobin is stable.  We will observe the patient over the  next 24 hours.  If the hemoglobin remains stable, the patient can  certainly be worked up as an outpatient.  However, he has a decrease in  his hemoglobin, given his cardiac risk profile, I would recommend that  he have colonoscopic evaluation.  The patient gives no history of dark,  black, or tarry stool which would suggest an upper GI bleed.  The  patient has no associated symptoms.  Thus I believe that his risks  before the GI bleed remains very low at this time.  The patient, if he  is discharged tomorrow, can follow up with his primary care physician's  office for followup with outpatient GI examination.  It should be noted  that the patient has not had a colonoscopy, even for screening, and thus  will need to be referred for one, in any event.  I have cautioned the  patient against any use of Goody's Headache Powder, and that he should  use Tylenol instead of aspirin, in addition to the aspirin he is already  using for treatment of pain.  For his other medical problems, we will  restart the patient's medications except for his aspirin, which I will  place on hold at this time, in light of the fact that the patient may be  experiencing a GI bleed.     Altha Harm, MD  Electronically  Signed     MAM/MEDQ  D:  05/19/2006  T:  05/20/2006  Job:  [id number]   cc:   Ernestina Penna, M.D.

## 2010-05-31 NOTE — Discharge Summary (Signed)
[place]  Patient:    Jacob Rios, Jacob Rios Visit Number: [id number] MRN: [id number]          Service Type: MED Location: [facility]UB [id number] Attending Physician:  Jonelle Sidle Dictated by:   Hillery Aldo, M.D. Adm. Date:  08/29/2000 Disc. Date: 09/02/2000   CC:         [facility], Attn. Dr. Christell Constant   Discharge Summary  ATTENDING NOT GIVEN  DISCHARGE DIAGNOSES: 1. Noncardiogenic chest pain. 2. Diabetes mellitus. 3. Allergy.  DISCHARGE MEDICATIONS: 1. Aspirin 325 mg p.o. q.d. 2. Toprol XL 100 mg p.o. q.d. 3. Nexium 40 mg p.o. q.d. 4. Zocor 40 mg p.o. q.d. 5. Benadryl 50 mg p.o. q.6h. p.r.n. itching.  FOLLOW-UP:  The patient is to follow up with [facility] for further treatment of noncardiogenic chest pain; additionally, the patient was instructed to call his primary care physician to get authorized to use the [facility] lipid clinic, given his decreased HDL and increased triglyceride levels.  PROCEDURES AND DIAGNOSTIC STUDIES: 1. Chest x-ray done on August 30, 2000.  Findings included pulmonary venous    hypertension without overt edema. 2. Cardiac catheterization done on September 01, 2000.  Findings were as follows:    a. Normal left ventricular ejection fraction.    b. Native two-vessel coronary artery disease.    c. Patent grafts, RCA, and diagonals.  Atretic LIMA but nonobstructive       disease in the LAD, stable.  Questionable noncardiac origin of chest       pain.  HISTORY OF PRESENT ILLNESS:  The patient is a 65 year old white male with a past medical history of hypertension, dyslipidemia, coronary artery disease status post MI and CABG in May 2000 (LIMA to LAD, RIMA to RCA, SVG to diagonal) who presented on August 30, 2000 with onset of left-sided chest pain/indigestion that began following activity (loading a truck) earlier in the afternoon of August 29, 2000 with a recurrence of symptoms that evening, prompting him to come to the emergency department.  Reportedly, the chest pain was nitrate responsive.  The patient was concerned because these symptoms were similar to the symptoms he had during a prior MI.  The patient was admitted to rule out MI.  His initial cardiac enzymes revealed a CK of 439 and a CK-MB of 4.3 with a relative index of 1.0.  His troponins were negative at less than 0.01.  These enzymes trended down over the course of his hospitalization and the last time they were checked his CK was 236, his CK-MB was 1.8, and his relative index was 0.7.  The troponins remained negative.  The patient also received a fasting lipid profile the next morning, which showed a cholesterol of 140, triglycerides of 265, HDL of 28, cholesterol/HDL ratio of 5.0, and LDL of 59.  His initial CBC showed a WBC of 6.6, hemoglobin of 14.8, hematocrit of 42.4, and a platelet count of 186.  His initial CMP showed a sodium of 137, potassium 4.0, chloride 105, carbon dioxide 25, glucose 143, BUN 14, creatinine 2.0, calcium 9.3, total protein 6.3, albumin 3.5, SGOT 25, SGPT 24, alkaline phosphatase 82, and total bilirubin of 0.7.  His PT was 12.9 seconds. His INR was 1.0.  His PTT was 28 seconds.  The patient was admitted and scheduled to receive a cardiac catheterization.  Cardiac catheterization was delayed secondary to a history of DYE allergy.  The patient was also  pretreated with Mucomyst, given his creatinine of 2.0.  The patient received premedication prior to this study with prednisone, Benadryl, and Pepcid.  He tolerated the procedure well.  Findings were as previously discussed.  His EKG initially showed no major changes from his prior EKG and there were no abnormalities identified.  The patient underwent his catheterization without incident and his catheter groin insertion site was stable without any evidence of hematomas, bleeding, or bruits.  The  patient is therefore being discharged home today.  DISCHARGE INSTRUCTIONS: 1. The patient will gradually increase his activity as tolerated.  The patient    was advised not to do any heavy lifting for at least one week. 2. Diet:  Low-cholesterol, diabetic diet. 3. Wound care:  The patient was instructed to call M.D. for bleeding,    extensive bruising or swelling, or signs of infection at the catheter site    in his right groin. 4. Follow up with [facility] for noncardiogenic chest    pain, and to be referred to the [facility] lipid    clinic. Dictated by:   Hillery Aldo, M.D. Attending Physician:  Jonelle Sidle DD:  09/02/00 TD:  09/02/00 Job: [id number]

## 2010-05-31 NOTE — Discharge Summary (Signed)
[place]  Patient:    Jacob Rios, Jacob Rios                       MRN: [id number] Adm. Date:  [id number] Disc. Date: 07/03/99 Attending:  Waldo Laine Dictator:   Loura Pardon, P.A. CC:         Gwenith Daily. Tyrone Sage, M.D.             Daisey Must, M.D. [facility]             Monica Becton, M.D.                           Discharge Summary  DATE OF BIRTH:  12-10-1945  ATTENDING SURGEON:  Dr. Sheliah Plane.  CARDIOLOGIST:  Dr. Loraine Leriche Pulsipher.  PRIMARY CAREGIVER:  Dr. Rudi Heap with [facility].  FINAL DIAGNOSES:  A 3.0 x 2.5 cm ventral incisional hernia (xiphoid area).  SECONDARY DIAGNOSES: 1. History of atherosclerotic coronary artery disease. 2. Status post coronary artery bypass graft surgery x 3, Jun 06, 1998. 3. Status post repair of ventral hernia at the chest tube site September 18, 1998. 4. History of myocardial infarction. 5. Hypertension. 6. Hypercholesterolemia. 7. Gastroesophageal reflux disease/hiatal hernia. 8. Chronic obstructive pulmonary disease. 9. History of extensive tobacco habituation, with a history of    four packs per day x 45 years, having quit in 1999.  PROCEDURES:  July 02, 1999, ventral herniorrhaphy with mesh, Dr. Sheliah Plane, surgeon.  The patient tolerated the procedure well and was transferred stable to the recovery room.  He did have some exquisite postoperative pain which was controlled with IV Dilaudid as well as oral analgesia with Tylox.  DISPOSITION:  [name] is judged a suitable candidate for discharge on postoperative day #1.  He has been afebrile in the postoperative period.  His incision is healing nicely.  There is no evidence of swelling, erythema, or drainage.  His pain is now controlled with oral analgesia, although at first after surgery he did require some IV narcotic relief.  His postoperative laboratories laboratories are within normal  limits.  He is ambulating independently.  His appetite is good.  He is taking oral nourishment well.  He has complete GI tract function.  He has experienced no cardiac dysrhythmias, no respiratory compromise in this postoperative period.  His mental status has been clear postoperatively.  DISCHARGE MEDICATIONS: 1. Tylox 1-2 p.o. q.4-6h. p.r.n. pain. 2. Prevacid 30 mg p.o. b.i.d. 3. Toprol XL 100 mg in the evening. 4. Altace 5 mg every morning. 5. Zocor 40 mg at bedtime. 6. Vitamin E 400 IU daily. 7. Multivitamin daily. 8. Enteric-coated aspirin 325 mg daily.  ACTIVITY:  Ambulation as tolerated.  He is asked not to drive while taking Tylox.  He is asked not to lift any heavy weights for six weeks.  WOUND CARE:  He may bathe daily beginning Friday, July 05, 1999.  He is to keep the incisions clean and dry.  He is to sponge bathe prior to that.  FOLLOW-UP:  He has an office visit scheduled with Dr. Tyrone Sage Thursday, July 18, 1999, at 12 noon.  HISTORY OF PRESENT ILLNESS:  [name] is a 65 year old who is status post coronary artery bypass graft surgery in May 2000.  The patient did well postoperatively from a cardiac standpoint.  In September 2000, he presented to  the office of [facility] with a small epigastric hernia at the chest tube site.  At that time, he underwent repair of this ventral hernia on September 18, 1999, without complications.  However, in April 2001, the patient was once again seen in follow-up.  He had a small ventral hernia below the xiphoid area.  Dr. Tyrone Sage recommended the patient should return for evaluation and for a ventral herniorrhaphy if the hernia become bothersome.  The patient returns on July 02, 1999, with increased pain on movement at the hernia site.  The hernia now measures 3.0 x 2.5 cm.  It is retractable when the patient is lying supine.  It is tender to palpation.  He is scheduled for hernia repair on July 02, 1999.  HOSPITAL COURSE:  Is as dictated in the discharge disposition.  [name] presented to [facility] July 02, 1999.  He underwent ventral herniorrhaphy with placement of mesh reinforcement by Dr. Tyrone Sage. He did require IV narcotic agents as well as Tylox in the postoperative period.  He was afebrile.  He did not require oxygen support.  His cardiac rhythm was stable.  He was ambulating.  He was taking oral nourishment.  He is a candidate for discharge on postoperative day #1.  He will have follow up with Dr. Tyrone Sage July 18, 1999, at noon.DD:  07/02/99 TD:  07/03/99 Job: [id number]

## 2010-05-31 NOTE — Cardiovascular Report (Signed)
[place]. [facility]  Patient:    ED, MANDICH Visit Number: [id number] MRN: [id number]          Service Type: MED Location: [facility] [id number] Attending Physician:  Jonelle Sidle Proc. Date: 09/01/00 Adm. Date:  08/29/2000 Disc. Date: 09/02/2000   CC:         Monica Becton, M.D.  Cardiac Catheterization Laboratory   Cardiac Catheterization  PROCEDURES PERFORMED:  Left heart catheterization with coronary angiography, left ventriculography, and bypass graft angiography.  INDICATIONS:  The patient is a 65 year old male, who is status post three-vessel coronary artery bypass surgery.  He was admitted to the hospital with two episodes of prolonged chest pain and ruled out for myocardial infarction.  He states his pain was similar to that that he had experienced with his previous myocardial infarction.  It was therefore opted to proceed with cardiac catheterization.  Of note, on admission to the hospital his creatinine was elevated at 2.0.  With administration of intravenous fluids and pretreatment with Mucomyst, his creatinine came down to 1.5.  He was also premedication for a possible CONTRAST allergy.  DESCRIPTION OF PROCEDURE:  A 6 French sheath was placed in the right femoral artery.  Catheters utilized included a 6 [place] JL4, JR4, internal mammary catheter, and angled pigtail.  Contrast was Omnipaque.  There were no complications.  RESULTS:  HEMODYNAMICS:  Left ventricular pressure 124/16, aortic pressure 124/82. There was no aortic valve gradient.  LEFT VENTRICULOGRAM:  Wall motion is normal.  Ejection fraction is calculated at 66%.  There is no mitral regurgitation.  CORONARY ARTERIOGRAPHY:  (Right dominant).  Left main:  Normal.  Left anterior descending:  The left anterior descending artery has a 50% stenosis in the proximal vessel.  There is a normal sized first diagonal which is 100% occluded at its origin.  There is a  small second diagonal.  The first diagonal fills via saphenous vein graft.  Left circumflex:  The left circumflex has a 20% stenosis in the mid vessel. It gives rise to a small OM-1, large branching OM-2 and a small OM-3.  OM-2 has a 20% stenosis in the lateral branch.  Right coronary artery:  The right coronary artery has a diffuse 80% stenosis in the proximal to mid vessel in a previously stented segment of vessel. There is 100% occlusion of the mid vessel.  The distal right coronary artery fills via right internal mammary artery graft.  Left internal mammary artery to the distal LAD is patent but very slender and atretic distally.  There is significant competitive flow through the native LAD with only moderate disease in the proximal vessel.  Thus, the LIMA is likely atretic secondary to competitive flow.  Saphenous vein graft to the first diagonal branch is patent throughout its course.  This reveals a normal sized first diagonal branch.  Right internal mammary artery to the distal right coronary artery is patent throughout its course with the exception of a 40% stenosis at the distal anastomosis.  This fills the right coronary artery including a normal sized posterior descending artery and a normal sized posterolateral branch.  IMPRESSIONS: 1. Normal left ventricular systolic function. 2. Native two-vessel coronary artery disease. 3. Status post coronary artery bypass surgery.  There are patent grafts to the distal right coronary artery and to the diagonal branch.  The left internal mammary artery is atretic, however, there is moderate and likely nonobstructive disease in the proximal LAD.  In summary, the patients  anatomy is stable compared to previous catheterizations.  He appears to be well revascularized.  Suspect his chest pain may have been noncardiac. Attending Physician:  Jonelle Sidle DD:  09/01/00 TD:  09/02/00 Job: [id number]

## 2010-05-31 NOTE — Cardiovascular Report (Signed)
NAME:  Jacob Rios, Jacob Rios                          ACCOUNT NO.:  [id number]   MEDICAL RECORD NO.:  [id number]                   PATIENT TYPE:  OIB   LOCATION:  2899                                 FACILITY:  [facility]   PHYSICIAN:  Carole Binning, M.D. [facility]         DATE OF BIRTH:  [birth year]   DATE OF PROCEDURE:  04/21/2003  DATE OF DISCHARGE:  04/21/2003                              CARDIAC CATHETERIZATION   PROCEDURES PERFORMED:  1. Left heart catheterization with,  2. Coronary angiography,  3. Bypass graft angiography; and,  4. Left ventriculography.   CARDIOLOGIST:  Carole Binning, M.D.   INDICATIONS:  Jacob Rios is a 65 year old male with history of coronary  artery disease and prior coronary artery bypass surgery.  The presented with  symptoms of recurrent chest pain.  A stress Cardiolite scan done in office  showed what appeared to be progression of inferior ischemia.  We therefore  opted to proceed with cardiac catheterization.   PROCEDURAL NOTE:  A 6 French sheath was placed in the right femoral artery.  Native coronary arteries were imaged with 6 [place] JL-4 and JR-4 catheters.  The saphenous vein graft to the diagonal branch was imaged with a JR-4  catheter.  The internal mammary arteries were imaged with an internal  mammary catheter.   CONTRAST MATERIAL:  Omnipaque.   COMPLICATIONS:  There were no complications.   RESULTS:   HEMODYNAMIC DATA:  Left ventricular pressure 150/22.  Aortic pressure  150/88.  There is no aortic valve gradient.   VENTRICULOGRAPHIC DATA:  Left Ventriculogram:  Wall motion is normal.  Ejection fraction estimated at 55%.  There is no mitral regurgitation.   ANGIOGRAPHIC DATA:  Coronary Angiography (Right Dominant)  Left Main:  The left main is normal.   Left Anterior Descending Artery:  The left anterior descending artery has a  40% stenosis in the midvessel and diffuse luminal irregularities in the  distal vessel.  The LAD gives rise  to a small first diagonal branch, normal-  sized second diagonal branch and a small diagonal branch.  The second  diagonal branch is 100% occluded and fills via saphenous vein graft.   Left Circumflex Artery:  The left circumflex has minor luminal  irregularities in the midvessel.  The circumflex gives rise to a small first  obtuse marginal, a very large branching second obtuse marginal and a third  obtuse marginal branch.   Right Coronary Artery:  The right coronary artery has a diffuse 80% stenosis  in the proximal and midvessel within previously placed stents.  The right  coronary artery is then 100% in the midvessel just beyond the right  ventricular branch.  The distal right coronary artery fills via a right  internal mammary graft.   Grafts:  Saphenous vein graft to the second diagonal branch is patent  throughout its course.  There was a normal size second diagonal branch.   Left internal mammary  artery to the distal LAD is 100% occluded proximally;  however, there is nonobstructive disease in the native left anterior  descending artery.   Right internal mammary artery to the distal right coronary artery is patent.  There is a 40% stenosis at the distal anastomosis, but the graft is  otherwise patent, filling a normal-sized distal right coronary artery  including a posterior descending artery and a posterolateral branch.  This  is unchanged from previous catheterization.   IMPRESSION:  1. Normal left ventricular systolic function.  2. Native two-vessel coronary artery disease.  3. Status post coronary artery bypass surgery.  The left internal mammary     artery to the left anterior descending is 100% occluded; however, there     is nonobstructive disease in the native left anterior descending.  There     is a patent saphenous vein graft to the second diagonal branch.  The     right internal mammary artery to the distal right coronary artery is     patent with, but  nonobstructive disease of the distal anastomosis.   SUMMARY:  In conclusion, there does not appear to be any significant changes  compared to previous catheterization.   PLAN:  The patient will be managed medically.                                               Carole Binning, M.D. [facility]    MWP/MEDQ  D:  04/21/2003  T:  04/23/2003  Job:  [id number]   cc:   Ernestina Penna, M.D.  [address] [place]  [place] [address]  Fax: [phone]   Cardiac Catheterization Laboratory

## 2010-05-31 NOTE — Assessment & Plan Note (Signed)
[facility]                              CARDIOLOGY OFFICE NOTE   Jacob, Rios                         MRN:          [id number]  DATE:09/26/2005                            DOB:          08/26/1945    PRIMARY CARE PHYSICIAN:  Dr. Vernon Prey.   CLINICAL HISTORY:  Jacob Rios returned for followup management of his  coronary heart disease.  He is 65 years old and had an inferior wall  infarction in 2000 followed by coronary bypass surgery.  His last  catheterization in 2005 showed occlusion of the intramammary artery to LAD  but the native LAD had only nonobstructive disease.  He had a patent vein to  the diagonal branch of LAD, a patent RIMA to the right coronary artery and  good LV function.   We have been doing well from the standpoint of his heart.  He has had no  recent chest pain, shortness of breath or palpitations.  His sugars have  been elevated and Dr. Christell Constant had increased his glipizide.   PAST MEDICAL HISTORY:  1. Hypertension.  2. Hyperlipidemia  3. Diabetes.   CURRENT MEDICATIONS:  Aspirin, Lipitor, Nexium, Toprol, fish oil and  glipizide.   EXAMINATION:  VITAL SIGNS:  Blood pressure 129/78, pulse 55 and regular.  NECK:  There was no venous distension.  The carotid pulses were full without  bruits.  CHEST:  Clear.  HEART:  Rhythm was regular.  Had no murmurs or gallops.  ABDOMEN:  Soft with normal bowel sounds.  There was no hepatosplenomegaly.  EXTREMITIES:  The peripheral pulses were full with no peripheral edema.   Electrocardiogram was otherwise normal.   IMPRESSION:  1. Coronary artery disease status post documented wall infarction in 2000      followed by coronary bypass surgery with coronary anatomy as described      above.  2. Good left ventricular function.  3. Hypertension.  4. Hyperlipidemia.  5. Type 2 diabetes.   RECOMMENDATIONS:  I think Jacob Rios is doing well.  I encouraged him to  continue his  exercise and lose weight to get better control of his diabetes  and hypertension.  We will not make any change in medication.  We plan to  see him back in followup in 1 year.                               Jacob Elvera Lennox Juanda Chance, MD, [facility]    BRB/MedQ  DD:  09/26/2005  DT:  09/27/2005  Job #:  [id number]

## 2010-05-31 NOTE — H&P (Signed)
[place]. [facility]  Patient:    Jacob, Rios                       MRN: [id number] Adm. Date:  [id number] Disc. Date: [id number] Attending:  Trauma, Md Dictator:   Marlowe Kays, P.A.                         History and Physical  DATE OF BIRTH:  11-23-1945  CHIEF COMPLAINT:  Ventral hernia.  HISTORY OF PRESENT ILLNESS:  Jacob Rios is a pleasant 65 year old white male, status post CABG x 3 on Jun 06, 1998.  The patient did well postoperatively, from the cardiac standpoint.  In September 2000 the patient presented to the office with small epigastric hernia at the chest tube site, undergoing a repair on September 18, 1998, without complications.  However, on April 18, 1999, the patient was seen again for followup.  He had a very small ventral hernia below the xiphoid area.  Dr. Tyrone Sage recommended that the patient should return for evaluation, if the ventral hernia became bothersome to him. He now returns today complaining of pain with movement at the hernia site, and stating that this area has increased in size.  It now measures 3 x 2.5 cm, is retractable, especially when the patient is lying supine.  This area is tender to palpation.  He denies any other type of abdominal pain.  No nausea, vomiting or diarrhea.  No constipation.  No melena, hematochezia, or hemoptysis.  The patient denies any back pain.  He is scheduled for the procedure on July 02, 1999.  PAST MEDICAL HISTORY: 1. CAD, status post CABG. 2. Ventral hernia. 3. History of MI. 4. Hypertension. 5. Hypercholesterolemia. 6. GERD--hiatal hernia. 7. COPD.  PAST SURGICAL HISTORY: 1. Status post ventral hernia repair after the patients bypass surgery    on September 18, 1998. 2. Status post CABG x 3 on Jun 06, 1998, by Dr. Tyrone Sage with LIMA to the    LAD, RIMA to the RCA and SVG to the diagonal. 3. Status post cardiac catheterization with PTCA and stent after MI on    April 1999, Dr.  Gerri Spore. 4. Status post cardiac catheterization, October 26, 1998, Dr. Gerri Spore. 5. Status post hernia repair, 1974. 6. Status post appendectomy, remote.  MEDICATIONS: 1. Prevacid 30 mg p.o. b.i.d. 2. Toprol XL 100 mg p.o. q.d. 3. Altace 5 mg one p.o. q.d. 4. Zocor 40 mg one p.o. q.d. 5. Vitamin E 400 units q.d. 6. Multivitamins q.d.  ALLERGIES:  MORPHINE.  REVIEW OF SYSTEMS:  See HPI and past medical history for significant positives.  The patient denies any diabetes, kidney disease, TIA, CVA, a amaurosis fugax, syncope, presyncope, angina, no new episodes of MI, PE, DVT. No dysuria or hematuria.  He occasionally experiences some palpitations which quickly are resolved.  FAMILY HISTORY:  Mother died of lung cancer at 7, father died at 35 of lymphoma.  The rest of the family history is noncontributory.  SOCIAL HISTORY:  The patient is married for 35 years.  He has three children. He is a Chartered loss adjuster, retired.  He quit in 1999.  A four pack a day of cigarettes for about 45 years.  He denies any alcohol intake.  PHYSICAL EXAMINATION:  GENERAL:  Well-developed, well-nourished, 65 year old white man in no acute distress.  Alert and oriented x 3.  VITAL SIGNS:  Blood pressure 140/80, pulse 56,  respirations 16.  HEENT:  Normocephalic, atraumatic.  PERRLA.  EOMI.  Funduscopic examination within normal limits.  NECK:  Supple.  No JVD, bruits, thyromegaly, or lymphadenopathy.  CHEST:  Symmetrical on inspiration.  LUNGS:  Clear to auscultation bilaterally.  CARDIOVASCULAR:  Regular rate and rhythm, 1/6 holosystolic murmur, occasional ectopic beat.  No rubs or gallops.  ABDOMEN:  Soft.  There is a tender area at the hernia, as mentioned in HPI. Bowel sounds x 4.  No hepatosplenomegaly.  GU/RECTAL:  Deferred, not clinically indicated.  EXTREMITIES:  No clubbing or cyanosis.  There is trace of edema bilaterally.  SKIN:  Reveals no ulcerations.  Normal hair pattern.   Warm temperature.  PULSES:  Carotid 2+ bilaterally without bruits.  Femoral 2+ bilaterally without bruits.  Popliteal, dorsalis pedis and posterior tibialis 2+ bilaterally.  NEUROLOGICAL:  Grossly normal, normal gait.  DTRs 2+ bilaterally.  Muscle strength 5/5.  ASSESSMENT AND PLAN:  A 65 year old white male, status post coronary artery bypass graft, as well as status post ventral hernia repair at the chest tube site, who will undergo repair of a new ventral hernia below the xiphoid area, on July 02, 1999, by Dr. Tyrone Sage.  Dr. Tyrone Sage had seen and evaluated this patient on the admission, and has explained the risks and benefits involving the procedure and the patient has agreed to continue.  DD:  06/28/99 TD:  06/28/99 Job: [id number]

## 2010-05-31 NOTE — Discharge Summary (Signed)
NAME:  Jacob Rios, Jacob Rios                          ACCOUNT NO.:  [id number]   MEDICAL RECORD NO.:  [id number]                   PATIENT TYPE:  OIB   LOCATION:  2899                                 FACILITY:  [facility]   PHYSICIAN:  Carole Binning, M.D. [facility]         DATE OF BIRTH:  11-Jun-1945   DATE OF ADMISSION:  04/21/2003  DATE OF DISCHARGE:  04/21/2003                                 DISCHARGE SUMMARY   DISCHARGE DIAGNOSES:  1. Admitted to [facility] April 15, 2003 with prolonged chest pain     episodes x 3, ruled out for myocardial infarction.  Outpatient adenosine     Cardiolite study done April 18, 2003 showed no stress-induced EKG     abnormalities.  Left ventricular dilatation. The scintigraphic imaging     showed predominant infarction inferiorly and inferolateral with some     small degree of periinfarction ischemia compared to his previous study     performed October 1999.  Perfusion deficit inferior wall more prominent.     There is also a minor degree of reversibility where none was previously     noted.  The patient will be admitted April 21, 2003 for a left heart     catheterization.  2. Left heart catheterization April 21, 2003.  Study showed normal left     ventricular ejection fraction, native two-vessel coronary artery disease,     patent stents to the diagonal with a saphenous vein graft, patent right     internal mammary artery to the distal right coronary artery, occluded     left internal mammary artery but nonobstructive disease in the left     anterior descending, mid point 40% stenosis.  There is a 40% in the     distal right coronary artery just distal to the anastomosis from the     right internal mammary artery.   PLAN:  Medical treatment.   SECONDARY DIAGNOSES:  1. Myocardial infarction in 1999 with percutaneous intervention, right     coronary artery.  2. Status post coronary artery bypass graft surgery in 2000 with left     internal mammary artery  to the left anterior descending, right internal     mammary artery to the right coronary artery and reverse saphenous vein     graft to the diagonal.  3. Status post catheterization in 2002 with right internal mammary artery     and saphenous vein graft patent with left internal mammary artery open     but atretic.  4. Hyperlipidemia.  5. Gastroesophageal reflux disease.  6. Status post incisional hernia and appendectomy.  7. History of allergies to contrast and morphine.   DISCHARGE DISPOSITION:  Jacob Rios is awaiting discharge after undergoing  left heart catheterization April 8.  The right lower extremity has palpable  pedal pulses.  The right groin is benign without swelling, drainage,  erythema or bruit.   He goes home  with his preoperative medications which include:  1. Toprol XL 100 mg daily.  2. Aspirin 325 mg daily.  3. Fish oil daily.  4. Lipitor 40 mg daily.  5. Nitroglycerin as needed.   DISCHARGE DIET:  Low sodium, low cholesterol diet.   ACTIVITY:  He is to avoid heavy lifting for the next two weeks.  He is not  to drive for the next two days.  He has a followup office visit with Dr.  Loraine Leriche Pulsipher May 08, 2003 at 10:30 in the morning.   BRIEF HISTORY:  Jacob Rios is a 65 year old male with a history of  aortocoronary bypass surgery in 2000.  His last catheterization was in 2002.  At that time, the LIMA was atretic with a 50% stenosis in the mid LAD.  The  RIMA had a 40% stenosis at the distal portion, but was patent to the RCA.  The SVG to the diagonal was patent as well.  Medical therapy recommended.  Jacob Rios began having chest pain described as indigestion and burning  similar to his reflux on April 2 but also similar to his anginal symptoms.  He took sublingual nitroglycerin and Nexium and got relief.  He was admitted  to [facility] to rule out myocardial infarction and subsequent  enzymes were negative.  He was scheduled for an in hospital  Cardiolite April  3.  However, he was mistakenly injected with technetium MAA instead of  technetium sestamibi.  Cardiolite was unable to be performed in the  hospital.  The patient had resolution of symptoms and after evaluation by  Dr. __________, was cleared for discharge with follow up of Cardiolite at  [facility].  That study has been dictated above, and  slightly abnormal, causing the patient to undergo repeat left heart  catheterization on April 21, 2003.  The plan is for the patient to reappear  April 21, 2003 for left heart catheterization.      Maple Mirza, P.A.                    Carole Binning, M.D. [facility]    GM/MEDQ  D:  04/21/2003  T:  04/23/2003  Job:  [id number]   cc:   Carole Binning, M.D. [facility]   Ernestina Penna, M.D.  [address] [place]  [place] [address]  Fax: [phone]

## 2010-07-10 ENCOUNTER — Telehealth: Payer: Self-pay | Admitting: Cardiovascular Disease

## 2010-07-10 NOTE — Telephone Encounter (Signed)
Glucotrol xl 10 mg [facility] [phone]

## 2010-07-15 ENCOUNTER — Telehealth: Payer: Self-pay | Admitting: Cardiovascular Disease

## 2010-07-15 NOTE — Telephone Encounter (Signed)
PT AWARE TO HAVE PMD  FILL  SCRIPT./CY

## 2010-07-15 NOTE — Telephone Encounter (Signed)
PT AWARE TO HAVE PMD  TO FILL  GLUCOTROL .Zack Seal

## 2010-07-15 NOTE — Telephone Encounter (Signed)
Pt need refill on blood sugar medicine glipizide 10 mg call into [facility]. Pt RX is currently prescribed by Dr. Juanda Chance, pt needs RX under pt new physician Dr. Clifton James

## 2010-07-15 NOTE — Telephone Encounter (Signed)
Due to Dr. Juanda Chance retirement there are some medications that he was filling that were not cardac was seeing if Dr.  Clifton James  was ok filling Glipizide for this pt will route to  [facility] nurse to verify if he will start filling this med for him

## 2010-07-15 NOTE — Telephone Encounter (Signed)
Pt returning call, pt was unsure as to what call was regarding.

## 2010-07-16 ENCOUNTER — Telehealth: Payer: Self-pay | Admitting: Cardiovascular Disease

## 2010-07-16 NOTE — Telephone Encounter (Signed)
Refill glucatrol xl 10 uses [facility] [phone]

## 2010-07-17 ENCOUNTER — Other Ambulatory Visit: Payer: Self-pay | Admitting: Cardiology

## 2010-07-18 ENCOUNTER — Telehealth: Payer: Self-pay | Admitting: Cardiovascular Disease

## 2010-07-18 NOTE — Telephone Encounter (Signed)
OK 

## 2010-07-18 NOTE — Telephone Encounter (Signed)
Will review with Dr Hochrein and call pt back 

## 2010-07-18 NOTE — Telephone Encounter (Signed)
Per Dr Antoine Poche - OK to follow with him in the [place] office.  Will forward to schedulers to schedule the appt.

## 2010-07-18 NOTE — Telephone Encounter (Signed)
Pt former pt of [facility] and was referred to [facility], pt's wife decided she wants him to see dr hochrein in [place], will he agree to take this pt?

## 2010-10-14 ENCOUNTER — Ambulatory Visit: Payer: Self-pay | Admitting: Cardiovascular Disease

## 2010-10-18 ENCOUNTER — Encounter: Payer: Self-pay | Admitting: Cardiovascular Disease

## 2010-10-21 ENCOUNTER — Encounter: Payer: Self-pay | Admitting: Cardiovascular Disease

## 2010-10-21 ENCOUNTER — Ambulatory Visit (INDEPENDENT_AMBULATORY_CARE_PROVIDER_SITE_OTHER): Payer: BC Managed Care – PPO | Admitting: Cardiovascular Disease

## 2010-10-21 VITALS — BP 152/80 | HR 66 | Resp 18 | Ht 71.0 in | Wt 255.8 lb

## 2010-10-21 DIAGNOSIS — E119 Type 2 diabetes mellitus without complications: Secondary | ICD-10-CM

## 2010-10-21 DIAGNOSIS — I2510 Atherosclerotic heart disease of native coronary artery without angina pectoris: Secondary | ICD-10-CM

## 2010-10-21 MED ORDER — METFORMIN HCL 500 MG PO TABS: 500.0000 mg | ORAL_TABLET | Freq: Every day | ORAL | Status: DC

## 2010-10-21 MED ORDER — GLIPIZIDE ER 10 MG PO TB24: 10.0000 mg | ORAL_TABLET | Freq: Every day | ORAL | Status: DC

## 2010-10-21 MED ORDER — ATORVASTATIN CALCIUM 40 MG PO TABS: 40.0000 mg | ORAL_TABLET | Freq: Every day | ORAL | Status: DC

## 2010-10-21 MED ORDER — METOPROLOL SUCCINATE ER 100 MG PO TB24: 50.0000 mg | ORAL_TABLET | Freq: Every day | ORAL | Status: DC

## 2010-10-21 NOTE — Assessment & Plan Note (Signed)
Stable. Continue ASA, statin, beta blocker. His lipids are followed in primary care.

## 2010-10-21 NOTE — Telephone Encounter (Signed)
Pt saw Dr. Clifton James in [place] office today and would like to follow up with him

## 2010-10-21 NOTE — Patient Instructions (Signed)
Your physician wants you to follow-up in:  12 months.  You will receive a reminder letter in the mail two months in advance. If you don't receive a letter, please call our office to schedule the follow-up appointment.   

## 2010-10-21 NOTE — Progress Notes (Signed)
History of Present Illness:65 yo male with history of CAD s/p CABG 2000, HTN, HLD, DM and former tobacco abuse here today for cardiac follow up. He has been followed in the past by Dr. Juanda Chance. In 2000 he had an MI  with subsequent bypass surgery (last cath 2005 with occluded LIMA to LAD-native LAD open, patent SVG to diagonal and patent RIMA to RCA). He quit smoking in 1999. 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    He has had no recent chest pain,  shortness of breath or palpitations. His BP has been well controlled. He has been active around the house and the yard.   His primary care is Dr. Vernon Prey, [facility]). His lipids are followed in primary care.   Past Medical History  Diagnosis Date  . Hypertension   . Hyperlipidemia   . Diabetes mellitus   . Coronary artery disease     3V CABG 2000. Last heart cath 2005, with 2 patent grafts    Past Surgical History  Procedure Date  . Coronary artery bypass graft     3V in 2000  . Hernia repair   . Appendectomy     Current Outpatient Prescriptions  Medication Sig Dispense Refill  . aspirin 325 MG tablet Take 325 mg by mouth daily.        Marland Kitchen atorvastatin (LIPITOR) 40 MG tablet Take 40 mg by mouth daily.        Marland Kitchen glipiZIDE (GLUCOTROL) 10 MG 24 hr tablet TAKE 1 TABLET BY MOUTH DAILY  30 tablet  3  . metFORMIN (GLUCOPHAGE) 500 MG tablet Take 500 mg by mouth daily with breakfast.        . metoprolol (TOPROL-XL) 100 MG 24 hr tablet Take 50 mg by mouth daily.        . Omega-3 Fatty Acids (FISH OIL) 1000 MG CAPS Take by mouth.        Marland Kitchen omeprazole (PRILOSEC) 20 MG capsule Take 20 mg by mouth daily.        . vitamin C (ASCORBIC ACID) 500 MG tablet Take 500 mg by mouth daily.          Allergies  Allergen Reactions  . Morphine     History   Social History  . Marital Status: Married    Spouse Name: N/A    Number of  Children: N/A  . Years of Education: N/A   Occupational History  . part-time trucker    Social History Main Topics  . Smoking status: Unknown If Ever Smoked  . Smokeless tobacco: Not on file  . Alcohol Use: No  . Drug Use: No  . Sexually Active: Not on file   Other Topics Concern  . Not on file   Social History Narrative  . No narrative on file    Family History  Problem Relation Age of Onset  . Lung cancer Mother   . Dementia Father     Review of Systems:  As stated in the HPI and otherwise negative.   BP 152/80  Pulse 66  Resp 18  Ht 5\' 11"  (1.803 m)  Wt 255 lb 12.8 oz (116.03 kg)  BMI 35.68 kg/m2  Physical Examination: General: Well developed, well nourished, NAD HEENT: OP clear, mucus membranes moist SKIN: warm, dry. No rashes. Neuro: No focal deficits Musculoskeletal: Muscle strength 5/5 all ext Psychiatric: Mood and affect  normal Neck: No JVD, no carotid bruits, no thyromegaly, no lymphadenopathy. Lungs:Clear bilaterally, no wheezes, rhonci, crackles Cardiovascular: Regular rate and rhythm. No murmurs, gallops or rubs. Abdomen:Soft. Bowel sounds present. Non-tender.  Extremities: No lower extremity edema. Pulses are 2 + in the bilateral DP/PT.  JXB:JYNWGN sinus rhythm, rate 66 bpm.

## 2011-08-15 ENCOUNTER — Encounter: Payer: Self-pay | Admitting: Cardiovascular Disease

## 2011-10-22 ENCOUNTER — Other Ambulatory Visit: Payer: Self-pay | Admitting: *Deleted

## 2011-10-22 ENCOUNTER — Other Ambulatory Visit: Payer: Self-pay | Admitting: Cardiovascular Disease

## 2011-10-22 MED ORDER — GLIPIZIDE ER 10 MG PO TB24: 10.0000 mg | ORAL_TABLET | Freq: Every day | ORAL | Status: DC

## 2011-10-22 NOTE — Telephone Encounter (Signed)
Refilled Glipizide.

## 2011-10-23 ENCOUNTER — Ambulatory Visit (INDEPENDENT_AMBULATORY_CARE_PROVIDER_SITE_OTHER): Payer: Medicare Other | Admitting: Cardiovascular Disease

## 2011-10-23 ENCOUNTER — Encounter: Payer: Self-pay | Admitting: Cardiovascular Disease

## 2011-10-23 VITALS — BP 148/84 | HR 57 | Ht 71.0 in | Wt 251.4 lb

## 2011-10-23 DIAGNOSIS — I2510 Atherosclerotic heart disease of native coronary artery without angina pectoris: Secondary | ICD-10-CM

## 2011-10-23 MED ORDER — METOPROLOL SUCCINATE ER 100 MG PO TB24: 50.0000 mg | ORAL_TABLET | Freq: Every day | ORAL | Status: DC

## 2011-10-23 MED ORDER — ATORVASTATIN CALCIUM 40 MG PO TABS: 40.0000 mg | ORAL_TABLET | Freq: Every day | ORAL | Status: DC

## 2011-10-23 NOTE — Progress Notes (Signed)
History of Present Illness: 66 yo male with history of CAD s/p CABG 2000, HTN, HLD, DM and former tobacco abuse here today for cardiac follow up. He has been followed in the past by Dr. Juanda Chance. In 2000 he had an MI with subsequent bypass surgery (last cath 2005 with occluded LIMA to LAD-native LAD open, patent SVG to diagonal and patent RIMA to RCA). He quit smoking in 1999. He has now fully retired. He was a Chartered loss adjuster for 28 years.   He has had no recent chest pain, shortness of breath or palpitations. His BP has been well controlled at home. He has been active around the house and the yard.   Primary Care Physician: Dr. Christell Constant  Last Lipid Profile: Followed in primary care. Total chol: 154  LDL: 84 HDL 13 September 2011.   Past Medical History  Diagnosis Date  . Hypertension   . Hyperlipidemia   . Diabetes mellitus   . Coronary artery disease     3V CABG 2000. Last heart cath 2005, with 2 patent grafts    Past Surgical History  Procedure Date  . Coronary artery bypass graft     3V in 2000  . Hernia repair   . Appendectomy     Current Outpatient Prescriptions  Medication Sig Dispense Refill  . aspirin 325 MG tablet Take 325 mg by mouth daily.        Marland Kitchen atorvastatin (LIPITOR) 40 MG tablet Take 1 tablet (40 mg total) by mouth daily.  30 tablet  11  . glipiZIDE (GLUCOTROL XL) 10 MG 24 hr tablet Take 1 tablet (10 mg total) by mouth daily.  30 tablet  5  . metFORMIN (GLUCOPHAGE) 500 MG tablet Take 1 tablet (500 mg total) by mouth daily with breakfast.  30 tablet  11  . metoprolol (TOPROL-XL) 100 MG 24 hr tablet Take 0.5 tablets (50 mg total) by mouth daily.  15 tablet  11  . Omega-3 Fatty Acids (FISH OIL) 1000 MG CAPS Take by mouth.        Marland Kitchen omeprazole (PRILOSEC) 20 MG capsule Take 20 mg by mouth daily.        . vitamin C (ASCORBIC ACID) 500 MG tablet Take 500 mg by mouth daily.          Allergies  Allergen Reactions  . Morphine     History   Social History  . Marital  Status: Married    Spouse Name: N/A    Number of Children: N/A  . Years of Education: N/A   Occupational History  . part-time trucker    Social History Main Topics  . Smoking status: Former Smoker    Quit date: 01/13/1997  . Smokeless tobacco: Not on file  . Alcohol Use: No  . Drug Use: No  . Sexually Active: Not on file   Other Topics Concern  . Not on file   Social History Narrative  . No narrative on file    Family History  Problem Relation Age of Onset  . Lung cancer Mother   . Dementia Father     Review of Systems:  As stated in the HPI and otherwise negative.   BP 148/84  Pulse 57  Ht 5\' 11"  (1.803 m)  Wt 251 lb 6.4 oz (114.034 kg)  BMI 35.06 kg/m2  Physical Examination: General: Well developed, well nourished, NAD HEENT: OP clear, mucus membranes moist SKIN: warm, dry. No rashes. Neuro: No focal deficits Musculoskeletal: Muscle strength 5/5  all ext Psychiatric: Mood and affect normal Neck: No JVD, no carotid bruits, no thyromegaly, no lymphadenopathy. Lungs:Clear bilaterally, no wheezes, rhonci, crackles Cardiovascular: Regular rate and rhythm. No murmurs, gallops or rubs. Abdomen:Soft. Bowel sounds present. Non-tender.  Extremities: No lower extremity edema. Pulses are 2 + in the bilateral DP/PT.  EKG: Sinus brady, rate 57 bpm.   Assessment and Plan:   1. CAD: Stable. Continue ASA, statin, beta blocker.  BP well controlled at home. His lipids are followed in primary care.

## 2011-10-23 NOTE — Patient Instructions (Addendum)
Your physician wants you to follow-up in:  12 months.  You will receive a reminder letter in the mail two months in advance. If you don't receive a letter, please call our office to schedule the follow-up appointment.   

## 2012-04-23 ENCOUNTER — Other Ambulatory Visit: Payer: Self-pay | Admitting: Cardiovascular Disease

## 2012-06-25 ENCOUNTER — Ambulatory Visit: Payer: Self-pay | Admitting: Nurse Practitioner

## 2012-07-30 ENCOUNTER — Encounter: Payer: Self-pay | Admitting: Nurse Practitioner

## 2012-07-30 ENCOUNTER — Other Ambulatory Visit: Payer: Self-pay

## 2012-07-30 ENCOUNTER — Ambulatory Visit (INDEPENDENT_AMBULATORY_CARE_PROVIDER_SITE_OTHER): Payer: Medicare Other | Admitting: Nurse Practitioner

## 2012-07-30 VITALS — BP 145/85 | HR 69 | Temp 97.4°F | Ht 71.0 in | Wt 247.0 lb

## 2012-07-30 DIAGNOSIS — I10 Essential (primary) hypertension: Secondary | ICD-10-CM

## 2012-07-30 DIAGNOSIS — K219 Gastro-esophageal reflux disease without esophagitis: Secondary | ICD-10-CM

## 2012-07-30 DIAGNOSIS — E785 Hyperlipidemia, unspecified: Secondary | ICD-10-CM

## 2012-07-30 DIAGNOSIS — I2510 Atherosclerotic heart disease of native coronary artery without angina pectoris: Secondary | ICD-10-CM

## 2012-07-30 DIAGNOSIS — Z23 Encounter for immunization: Secondary | ICD-10-CM

## 2012-07-30 DIAGNOSIS — E119 Type 2 diabetes mellitus without complications: Secondary | ICD-10-CM

## 2012-07-30 LAB — COMPLETE METABOLIC PANEL WITH GFR
Alkaline Phosphatase: 80 U/L (ref 39–117)
CO2: 28 mEq/L (ref 19–32)
Chloride: 104 mEq/L (ref 96–112)
Creat: 1.34 mg/dL (ref 0.50–1.35)
GFR, Est Non African American: 55 mL/min — ABNORMAL LOW
Total Bilirubin: 0.6 mg/dL (ref 0.3–1.2)
Total Protein: 6.9 g/dL (ref 6.0–8.3)

## 2012-07-30 LAB — POCT GLYCOSYLATED HEMOGLOBIN (HGB A1C): Hemoglobin A1C: 7.7

## 2012-07-30 MED ORDER — OMEPRAZOLE 20 MG PO CPDR: 20.0000 mg | DELAYED_RELEASE_CAPSULE | Freq: Every day | ORAL | Status: DC

## 2012-07-30 MED ORDER — METFORMIN HCL 1000 MG PO TABS: 1000.0000 mg | ORAL_TABLET | Freq: Two times a day (BID) | ORAL | Status: DC

## 2012-07-30 MED ORDER — GLIPIZIDE ER 10 MG PO TB24
10.0000 mg | ORAL_TABLET | Freq: Every day | ORAL | Status: DC
Start: 2012-07-30 — End: 2013-08-01

## 2012-07-30 MED ORDER — METOPROLOL SUCCINATE ER 100 MG PO TB24: 50.0000 mg | ORAL_TABLET | Freq: Every day | ORAL | Status: DC

## 2012-07-30 MED ORDER — ATORVASTATIN CALCIUM 40 MG PO TABS: 40.0000 mg | ORAL_TABLET | Freq: Every day | ORAL | Status: DC

## 2012-07-30 NOTE — Progress Notes (Signed)
Subjective:    Patient ID: Jacob Rios, male    DOB: 06-28-1945, 67 y.o.   MRN: [id number]  Hypertension This is a chronic problem. The current episode started more than 1 year ago. The problem has been waxing and waning (home bp 123/72 this AM) since onset. The problem is controlled. Pertinent negatives include no anxiety, blurred vision, chest pain, palpitations or shortness of breath. Risk factors for coronary artery disease include diabetes mellitus, dyslipidemia, obesity and family history. Past treatments include beta blockers. The current treatment provides mild improvement. There is no history of a thyroid problem.  Hyperlipidemia This is a chronic problem. The current episode started more than 1 year ago. The problem is controlled. Recent lipid tests were reviewed and are normal. Exacerbating diseases include diabetes. Pertinent negatives include no chest pain, leg pain, myalgias or shortness of breath. Current antihyperlipidemic treatment includes statins. The current treatment provides significant improvement of lipids. Risk factors for coronary artery disease include dyslipidemia, diabetes mellitus, family history, hypertension and male sex.  Diabetes He presents for his follow-up diabetic visit. He has type 2 diabetes mellitus. His disease course has been stable. There are no hypoglycemic associated symptoms. Pertinent negatives for diabetes include no blurred vision, no chest pain, no foot ulcerations and no visual change. There are no hypoglycemic complications. Symptoms are stable. There are no diabetic complications. Risk factors for coronary artery disease include diabetes mellitus, dyslipidemia, family history, hypertension and male sex. Current diabetic treatment includes oral agent (dual therapy). He is compliant with treatment all of the time. His weight is stable. He is following a diabetic diet. He has not had a previous visit with a dietician. He participates in exercise every  other day. His breakfast blood glucose range is generally 110-130 mg/dl. His lunch blood glucose is taken between 10-11 am. His lunch blood glucose range is generally 90-110 mg/dl. His highest blood glucose is 130-140 mg/dl. An ACE inhibitor/angiotensin II receptor blocker is not being taken. He does not see a podiatrist.Eye exam is current (Oct 2013).      Review of Systems  Eyes: Negative for blurred vision.  Respiratory: Negative for shortness of breath.   Cardiovascular: Negative for chest pain and palpitations.  Musculoskeletal: Negative for myalgias.  All other systems reviewed and are negative.       Objective:   Physical Exam  Constitutional: He is oriented to person, place, and time. He appears well-developed and well-nourished.  HENT:  Head: Normocephalic.  Right Ear: External ear normal.  Left Ear: External ear normal.  Mouth/Throat: Oropharynx is clear and moist.  Eyes: Pupils are equal, round, and reactive to light.  Neck: Normal range of motion. Neck supple. No thyromegaly present.  Cardiovascular: Normal rate, regular rhythm, normal heart sounds and intact distal pulses.   Pulmonary/Chest: Effort normal and breath sounds normal.  Abdominal: Soft. Bowel sounds are normal. He exhibits no distension. There is no tenderness.  Musculoskeletal: Normal range of motion. He exhibits no edema.  Neurological: He is alert and oriented to person, place, and time.  Skin: Skin is warm and dry.  Psychiatric: He has a normal mood and affect. His behavior is normal. Judgment and thought content normal.   See Diabetic foot note  BP 145/85  Pulse 69  Temp(Src) 97.4 F (36.3 C) (Oral)  Ht 5\' 11"  (1.803 m)  Wt 247 lb (112.038 kg)  BMI 34.46 kg/m2 Results for orders placed in visit on 07/30/12  POCT GLYCOSYLATED HEMOGLOBIN (HGB A1C)  Result Value Range   Hemoglobin A1C 7.7%          Assessment & Plan:  1. HYPERLIPIDEMIA-MIXED Low fat diet and exercise - NMR  Lipoprofile with Lipids - atorvastatin (LIPITOR) 40 MG tablet; Take 1 tablet (40 mg total) by mouth daily.  Dispense: 30 tablet; Refill: 11  2. CAD (coronary artery disease)   3. Diabetes mellitus Carb counting Increased metformin to 1000mg  BID - POCT glycosylated hemoglobin (Hb A1C) - glipiZIDE (GLUCOTROL XL) 10 MG 24 hr tablet; Take 1 tablet (10 mg total) by mouth daily.  Dispense: 30 tablet; Refill: 11 - metFORMIN (GLUCOPHAGE) 1000 MG tablet; Take 1 tablet (1,000 mg total) by mouth 2 (two) times daily with a meal.  Dispense: 30 tablet; Refill: 11  4. HYPERTENSION, BENIGN Low NA+ diet - COMPLETE METABOLIC PANEL WITH GFR  5. Coronary atherosclerosis of native coronary artery  - metoprolol succinate (TOPROL-XL) 100 MG 24 hr tablet; Take 1 tablet (100 mg total) by mouth daily.  Dispense: 15 tablet; Refill: 11  6. GERD (gastroesophageal reflux disease) Watch spicy foods Do not eat 2 hours prior to bedtime - omeprazole (PRILOSEC) 20 MG capsule; Take 1 capsule (20 mg total) by mouth daily.  Dispense: 30 capsule; Refill: 11  Tdap and pneumonia vaccine given today  [name] Daphine Deutscher, FNP

## 2012-07-30 NOTE — Patient Instructions (Addendum)
Hypertension As your heart beats, it forces blood through your arteries. This force is your blood pressure. If the pressure is too high, it is called hypertension (HTN) or high blood pressure. HTN is dangerous because you may have it and not know it. High blood pressure may mean that your heart has to work harder to pump blood. Your arteries may be narrow or stiff. The extra work puts you at risk for heart disease, stroke, and other problems.  Blood pressure consists of two numbers, a higher number over a lower, 110/72, for example. It is stated as "110 over 72." The ideal is below 120 for the top number (systolic) and under 80 for the bottom (diastolic). Write down your blood pressure today. You should pay close attention to your blood pressure if you have certain conditions such as:  Heart failure.  Prior heart attack.  Diabetes  Chronic kidney disease.  Prior stroke.  Multiple risk factors for heart disease. To see if you have HTN, your blood pressure should be measured while you are seated with your arm held at the level of the heart. It should be measured at least twice. A one-time elevated blood pressure reading (especially in the Emergency Department) does not mean that you need treatment. There may be conditions in which the blood pressure is different between your right and left arms. It is important to see your caregiver soon for a recheck. Most people have essential hypertension which means that there is not a specific cause. This type of high blood pressure may be lowered by changing lifestyle factors such as:  Stress.  Smoking.  Lack of exercise.  Excessive weight.  Drug/tobacco/alcohol use.  Eating less salt. Most people do not have symptoms from high blood pressure until it has caused damage to the body. Effective treatment can often prevent, delay or reduce that damage. TREATMENT  When a cause has been identified, treatment for high blood pressure is directed at the  cause. There are a large number of medications to treat HTN. These fall into several categories, and your caregiver will help you select the medicines that are best for you. Medications may have side effects. You should review side effects with your caregiver. If your blood pressure stays high after you have made lifestyle changes or started on medicines,   Your medication(s) may need to be changed.  Other problems may need to be addressed.  Be certain you understand your prescriptions, and know how and when to take your medicine.  Be sure to follow up with your caregiver within the time frame advised (usually within two weeks) to have your blood pressure rechecked and to review your medications.  If you are taking more than one medicine to lower your blood pressure, make sure you know how and at what times they should be taken. Taking two medicines at the same time can result in blood pressure that is too low. SEEK IMMEDIATE MEDICAL CARE IF:  You develop a severe headache, blurred or changing vision, or confusion.  You have unusual weakness or numbness, or a faint feeling.  You have severe chest or abdominal pain, vomiting, or breathing problems. MAKE SURE YOU:   Understand these instructions.  Will watch your condition.  Will get help right away if you are not doing well or get worse. Document Released: 12/30/2004 Document Revised: 03/24/2011 Document Reviewed: 08/20/2007 [facility] Patient Information 2014 [place], [place]. Tetanus, Diphtheria, Pertussis (Tdap) Vaccine What You Need to Know WHY GET VACCINATED? Tetanus, diphtheria and  pertussis can be very serious diseases, even for adolescents and adults. Tdap vaccine can protect [place] from these diseases. TETANUS (Lockjaw) causes painful muscle tightening and stiffness, usually all over the body.  It can lead to tightening of muscles in the head and neck so you can't open your mouth, swallow, or sometimes even breathe. Tetanus kills  about 1 out of 5 people who are infected. DIPHTHERIA can cause a thick coating to form in the back of the throat.  It can lead to breathing problems, paralysis, heart failure, and death. PERTUSSIS (Whooping Cough) causes severe coughing spells, which can cause difficulty breathing, vomiting and disturbed sleep.  It can also lead to weight loss, incontinence, and rib fractures. Up to 2 in 100 adolescents and 5 in 100 adults with pertussis are hospitalized or have complications, which could include pneumonia and death. These diseases are caused by bacteria. Diphtheria and pertussis are spread from person to person through coughing or sneezing. Tetanus enters the body through cuts, scratches, or wounds. Before vaccines, the [place] saw as many as 200,000 cases a year of diphtheria and pertussis, and hundreds of cases of tetanus. Since vaccination began, tetanus and diphtheria have dropped by about 99% and pertussis by about 80%. TDAP VACCINE Tdap vaccine can protect adolescents and adults from tetanus, diphtheria, and pertussis. One dose of Tdap is routinely given at age 87 or 64. People who did not get Tdap at that age should get it as soon as possible. Tdap is especially important for health care professionals and anyone having close contact with a baby younger than 12 months. Pregnant women should get a dose of Tdap during every pregnancy, to protect the newborn from pertussis. Infants are most at risk for severe, life-threatening complications from pertussis. A similar vaccine, called Td, protects from tetanus and diphtheria, but not pertussis. A Td booster should be given every 10 years. Tdap may be given as one of these boosters if you have not already gotten a dose. Tdap may also be given after a severe cut or burn to prevent tetanus infection. Your doctor can give you more information. Tdap may safely be given at the same time as other vaccines. SOME PEOPLE SHOULD NOT GET THIS  VACCINE  If you ever had a life-threatening allergic reaction after a dose of any tetanus, diphtheria, or pertussis containing vaccine, OR if you have a severe allergy to any part of this vaccine, you should not get Tdap. Tell your doctor if you have any severe allergies.  If you had a coma, or long or multiple seizures within 7 days after a childhood dose of DTP or DTaP, you should not get Tdap, unless a cause other than the vaccine was found. You can still get Td.  Talk to your doctor if you:  have epilepsy or another nervous system problem,  had severe pain or swelling after any vaccine containing diphtheria, tetanus or pertussis,  ever had Guillain-Barr Syndrome (GBS),  aren't feeling well on the day the shot is scheduled. RISKS OF A VACCINE REACTION With any medicine, including vaccines, there is a chance of side effects. These are usually mild and go away on their own, but serious reactions are also possible. Brief fainting spells can follow a vaccination, leading to injuries from falling. Sitting or lying down for about 15 minutes can help prevent these. Tell your doctor if you feel dizzy or light-headed, or have vision changes or ringing in the ears. Mild problems following Tdap (Did not interfere  with activities)  Pain where the shot was given (about 3 in 4 adolescents or 2 in 3 adults)  Redness or swelling where the shot was given (about 1 person in 5)  Mild fever of at least 100.34F (up to about 1 in 25 adolescents or 1 in 100 adults)  Headache (about 3 or 4 people in 10)  Tiredness (about 1 person in 3 or 4)  Nausea, vomiting, diarrhea, stomach ache (up to 1 in 4 adolescents or 1 in 10 adults)  Chills, body aches, sore joints, rash, swollen glands (uncommon) Moderate problems following Tdap (Interfered with activities, but did not require medical attention)  Pain where the shot was given (about 1 in 5 adolescents or 1 in 100 adults)  Redness or swelling where the  shot was given (up to about 1 in 16 adolescents or 1 in 25 adults)  Fever over 102F (about 1 in 100 adolescents or 1 in 250 adults)  Headache (about 3 in 20 adolescents or 1 in 10 adults)  Nausea, vomiting, diarrhea, stomach ache (up to 1 or 3 people in 100)  Swelling of the entire arm where the shot was given (up to about 3 in 100). Severe problems following Tdap (Unable to perform usual activities, required medical attention)  Swelling, severe pain, bleeding and redness in the arm where the shot was given (rare). A severe allergic reaction could occur after any vaccine (estimated less than 1 in a million doses). WHAT IF THERE IS A SERIOUS REACTION? What should I look for?  Look for anything that concerns you, such as signs of a severe allergic reaction, very high fever, or behavior changes. Signs of a severe allergic reaction can include hives, swelling of the face and throat, difficulty breathing, a fast heartbeat, dizziness, and weakness. These would start a few minutes to a few hours after the vaccination. What should I do?  If you think it is a severe allergic reaction or other emergency that can't wait, call 9-1-1 or get the person to the nearest hospital. Otherwise, call your doctor.  Afterward, the reaction should be reported to the "Vaccine Adverse Event Reporting System" (VAERS). Your doctor might file this report, or you can do it yourself through the VAERS web site at www.vaers.LAgents.no, or by calling 1-(804)796-3628. VAERS is only for reporting reactions. They do not give medical advice.  THE NATIONAL VACCINE INJURY COMPENSATION PROGRAM The National Vaccine Injury Compensation Program (VICP) is a federal program that was created to compensate people who may have been injured by certain vaccines. Persons who believe they may have been injured by a vaccine can learn about the program and about filing a claim by calling 1-272 101 3435 or visiting the VICP website at  SpiritualWord.at. HOW CAN I LEARN MORE?  Ask your doctor.  Call your local or state health department.  Contact the Centers for Disease Control and Prevention (CDC):  Call 818-023-1634 or visit CDC's website at PicCapture.uy. CDC Tdap Vaccine VIS (05/22/11) Document Released: 07/01/2011 Document Revised: 09/24/2011 Document Reviewed: 07/01/2011 [employer] Patient Information 2014 [employer], [place]. Pneumococcal Polysaccharide Vaccine What You Need to Know PNEUMOCOCCAL DISEASE Pneumococcal disease is caused by Streptococcus pneumoniae bacteria. It is a leading cause of vaccine-preventable illness and death in the [place]. Anyone can get pneumococcal disease, but some people are at greater risk than others:  People 17 and older.  The very young.  People with certain health problems.  People with a weakened immune system.  Smokers. Pneumococcal disease can lead to serious  infections of the:  Lungs (pneumonia).  Blood (bacteremia).  Covering of the brain (meningitis). Pneumococcal pneumonia kills about 1 out of 20 people who get it. Bacteremia kills about 1 person in 5, and meningitis about 3 people in 10.  PNEUMOCOCCAL POLYSACCHARIDE VACCINE (PPSV) Treatment of pneumococcal infections with penicillin and other drugs used to be more effective. However, some strains of the disease have become resistant to these drugs. This makes prevention of the disease, through vaccination, even more important. Pneumococcal polysaccharide vaccine (PPSV) protects against 23 types of pneumococcal bacteria, including those most likely to cause serious disease. Most healthy adults who get the vaccine develop protection to most or all of these types within 2 to 3 weeks of getting the shot. Very old people, children under 20 years of age, and people with some long-term illnesses might not respond as well, or at all. Another type of pneumococcal vaccine (pneumococcal conjugate  vaccine, or PCV) is routinely recommended for children younger than 64 years of age.  WHO SHOULD GET PPSV?  All adults 46 years of age or older.  Anyone 2 through 66 years of age who has a long-term health problem, such as:  Heart disease.  Lung disease.  Sickle cell disease.  Diabetes.  Alcoholism.  Cirrhosis.  Leaks of cerebrospinal fluid.  Cochlear implant.  Anyone 2 through 66 years of age who has a disease or condition that lowers the body's resistance to infection, such as:  Hodgkin's disease.  Lymphoma or leukemia.  Kidney failure.  Multiple myeloma.  Nephrotic syndrome.  HIV infection or AIDS.  Damaged spleen or no spleen.  Organ transplant.  Anyone 2 through 66 years of age who is taking a drug or treatment that lowers the body's resistance to infection, such as:  Long-term steroids.  Certain cancer drugs.  Radiation therapy.  Any adult 71 through 66 years of age who:  Is a smoker.  Has asthma. PPSV may be less effective for some people, especially those with lower resistance to infection. However, these people should still be vaccinated because they are more likely to have serious complications if they get pneumococcal disease. Children who often get ear infections, sinus infections, or other upper respiratory diseases, but who are otherwise healthy, do not need to get PPSV because it is not effective against those conditions. HOW MANY DOSES OF PPSV ARE NEEDED, AND WHEN? Usually only 1 dose of PPSV is needed, but under some circumstances a second dose may be given.  A second dose is recommended for people age 57 and older who got their first dose when they were younger than 32 if 5 or more years have passed since the first dose.  A second dose is recommended for people 2 through 66 years of age who:  Have a damaged spleen or no spleen.  Have sickle cell disease.  Have HIV infection or AIDS.  Have cancer, leukemia, lymphoma, or multiple  myeloma.  Have nephrotic syndrome.  Have had an organ or bone marrow transplant.  Are taking medicine that lowers immunity (such as chemotherapy or long-term steroids). When a second dose is given, it should be given 5 years after the first dose. SOME PEOPLE SHOULD NOT GET PPSV OR SHOULD WAIT  Anyone who has had a life-threatening allergic reaction to PPSV should not get another dose.  Anyone who has a severe allergy to any component of a vaccine should not get that vaccine. Tell your caregiver if you have any severe allergies.  Anyone who is  moderately or severely ill when the shot is scheduled may be asked to wait until they recover before getting the vaccine. Someone with a mild illness can usually be vaccinated.  While there is no evidence that PPSV is harmful to either a pregnant woman or to her fetus, as a precaution, women with conditions that put them at risk for pneumococcal disease should be vaccinated before becoming pregnant, if possible. WHAT ARE THE RISKS FROM PPSV?  About half of the people who get PPSV have mild side effects, such as redness or pain where the shot is given.  Less than 1% develop a fever, muscle aches, or more severe local reactions.  A vaccine, like any medicine, can cause a serious reaction. However, the risk of a vaccine causing serious harm, or death, is extremely small. WHAT IF THERE IS A SEVERE REACTION? What should I look for? Any unusual condition, such as a high fever or behavior changes. Signs of a severe allergic reaction can include difficulty breathing, hoarseness or wheezing, hives, paleness, weakness, a fast heartbeat, or dizziness. What should I do?  Call a caregiver, or get the person to a caregiver right away.  Tell your caregiver what happened, the date and time it happened, and when the vaccination was given.  Ask your caregiver to report the reaction by filing a Vaccine Adverse Event Reporting System (VAERS) form. Or, you can file  this report through the VAERS website at www.vaers.LAgents.no or by calling 1-(954)738-4643. VAERS does not provide medical advice. HOW CAN I LEARN MORE?  Ask your caregiver. He or she can give you the vaccine package insert or suggest other sources of information.  Call your local or state health department.  Contact the Centers for Disease Control and Prevention (CDC):  Call 269-804-5735 (1-800-CDC-INFO).  Visit the CDC website at PicCapture.uy CDC Pneumococcal Polysaccharide Vaccine VIS (10/19/07) Document Released: 10/27/2005 Document Revised: 03/24/2011 Document Reviewed: 10/19/2007 [employer] Patient Information 2014 [place], [place].

## 2012-08-03 LAB — NMR LIPOPROFILE WITH LIPIDS
Cholesterol, Total: 107 mg/dL (ref <200)
HDL Size: 8.1 nm — ABNORMAL LOW (ref >=9.2)
LDL (calc): 64 mg/dL (ref <100)
LDL Particle Number: 1024 nmol/L — ABNORMAL HIGH (ref <1000)
LP-IR Score: 62 — ABNORMAL HIGH (ref <=45)
Small LDL Particle Number: 687 nmol/L — ABNORMAL HIGH (ref <=527)
VLDL Size: 48 nm — ABNORMAL HIGH (ref <=46.6)

## 2012-08-11 ENCOUNTER — Telehealth: Payer: Self-pay | Admitting: Nurse Practitioner

## 2012-08-11 NOTE — Telephone Encounter (Signed)
Pt aware of labs  

## 2012-08-16 ENCOUNTER — Telehealth: Payer: Self-pay | Admitting: Nurse Practitioner

## 2012-08-16 NOTE — Telephone Encounter (Signed)
Cant find in epic- do i just need to hand write rx

## 2012-08-17 ENCOUNTER — Telehealth: Payer: Self-pay | Admitting: Nurse Practitioner

## 2012-08-17 NOTE — Telephone Encounter (Signed)
rx sent to pharmacy

## 2012-08-17 NOTE — Telephone Encounter (Signed)
Patient aware rx sent to pharmacy.  

## 2012-08-17 NOTE — Telephone Encounter (Signed)
Yes

## 2012-08-17 NOTE — Telephone Encounter (Signed)
Hand written rx ready for pick up

## 2012-08-18 ENCOUNTER — Other Ambulatory Visit: Payer: Self-pay | Admitting: Nurse Practitioner

## 2012-08-18 ENCOUNTER — Telehealth: Payer: Self-pay | Admitting: Nurse Practitioner

## 2012-08-18 DIAGNOSIS — K219 Gastro-esophageal reflux disease without esophagitis: Secondary | ICD-10-CM

## 2012-08-18 MED ORDER — OMEPRAZOLE 20 MG PO CPDR: 20.0000 mg | DELAYED_RELEASE_CAPSULE | Freq: Every day | ORAL | Status: DC

## 2012-08-18 NOTE — Telephone Encounter (Signed)
done

## 2012-08-18 NOTE — Telephone Encounter (Signed)
Refill request

## 2012-08-19 ENCOUNTER — Telehealth: Payer: Self-pay | Admitting: Nurse Practitioner

## 2012-08-19 MED ORDER — OMEPRAZOLE 40 MG PO CPDR: 40.0000 mg | DELAYED_RELEASE_CAPSULE | Freq: Every day | ORAL | Status: DC

## 2012-08-19 NOTE — Telephone Encounter (Signed)
rx increased to 40 mg

## 2012-09-01 ENCOUNTER — Other Ambulatory Visit: Payer: Self-pay | Admitting: Nurse Practitioner

## 2012-09-01 MED ORDER — ONETOUCH ULTRASOFT LANCETS MISC: Status: AC

## 2012-09-01 MED ORDER — OMEPRAZOLE 40 MG PO CPDR: 40.0000 mg | DELAYED_RELEASE_CAPSULE | Freq: Every day | ORAL | Status: DC

## 2012-09-01 MED ORDER — GLUCOSE BLOOD VI STRP: ORAL_STRIP | Status: DC

## 2012-11-03 ENCOUNTER — Ambulatory Visit (INDEPENDENT_AMBULATORY_CARE_PROVIDER_SITE_OTHER): Payer: Medicare Other

## 2012-11-03 DIAGNOSIS — Z23 Encounter for immunization: Secondary | ICD-10-CM

## 2012-11-12 ENCOUNTER — Ambulatory Visit (INDEPENDENT_AMBULATORY_CARE_PROVIDER_SITE_OTHER): Payer: Medicare Other | Admitting: Cardiovascular Disease

## 2012-11-12 ENCOUNTER — Encounter: Payer: Self-pay | Admitting: Cardiovascular Disease

## 2012-11-12 VITALS — BP 148/82 | HR 61 | Ht 71.0 in | Wt 246.0 lb

## 2012-11-12 DIAGNOSIS — I2510 Atherosclerotic heart disease of native coronary artery without angina pectoris: Secondary | ICD-10-CM

## 2012-11-12 DIAGNOSIS — E785 Hyperlipidemia, unspecified: Secondary | ICD-10-CM

## 2012-11-12 MED ORDER — METOPROLOL SUCCINATE ER 50 MG PO TB24: 50.0000 mg | ORAL_TABLET | Freq: Every day | ORAL | Status: DC

## 2012-11-12 MED ORDER — ATORVASTATIN CALCIUM 40 MG PO TABS: 40.0000 mg | ORAL_TABLET | Freq: Every day | ORAL | Status: DC

## 2012-11-12 NOTE — Progress Notes (Signed)
History of Present Illness: 67 yo male with history of CAD s/p CABG 2000, HTN, HLD, DM and former tobacco abuse here today for cardiac follow up. He has been followed in the past by Dr. Juanda Chance. In 2000 he had an MI with subsequent bypass surgery (last cath 2005 with occluded LIMA to LAD-native LAD open, patent SVG to diagonal and patent RIMA to RCA). He quit smoking in 1999. He has now fully retired. He was a Chartered loss adjuster for 28 years.   He has had no recent chest pain, shortness of breath or palpitations. His BP has been well controlled at home. He has been active around the house and the yard. His BP has been well controlled at home.   Primary Care Physician: Dr. Christell Constant  Last Lipid Profile: Followed in primary care. Total chol: 154  LDL: 84 HDL 13 September 2011.   Past Medical History  Diagnosis Date  . Hypertension   . Hyperlipidemia   . Diabetes mellitus   . Coronary artery disease     3V CABG 2000. Last heart cath 2005, with 2 patent grafts    Past Surgical History  Procedure Laterality Date  . Coronary artery bypass graft      3V in 2000  . Hernia repair    . Appendectomy      Current Outpatient Prescriptions  Medication Sig Dispense Refill  . aspirin 325 MG tablet Take 81 mg by mouth daily.       Marland Kitchen atorvastatin (LIPITOR) 40 MG tablet Take 1 tablet (40 mg total) by mouth daily.  30 tablet  11  . glipiZIDE (GLUCOTROL XL) 10 MG 24 hr tablet Take 1 tablet (10 mg total) by mouth daily.  30 tablet  11  . glucose blood (ONE TOUCH ULTRA TEST) test strip Patient test 1 X per day and prn-- Dx 250.02  100 each  12  . Lancets (ONETOUCH ULTRASOFT) lancets Patient test 1X per day and prn  Dx 250.02  100 each  12  . metFORMIN (GLUCOPHAGE) 1000 MG tablet Take 1 tablet (1,000 mg total) by mouth 2 (two) times daily with a meal.  30 tablet  11  . metoprolol succinate (TOPROL-XL) 100 MG 24 hr tablet Take 1 tablet (100 mg total) by mouth daily.  15 tablet  11  . Omega-3 Fatty Acids (FISH  OIL) 1000 MG CAPS Take by mouth.        Marland Kitchen omeprazole (PRILOSEC) 40 MG capsule Take 1 capsule (40 mg total) by mouth daily.  30 capsule  5  . vitamin C (ASCORBIC ACID) 500 MG tablet Take 500 mg by mouth daily.         No current facility-administered medications for this visit.    Allergies  Allergen Reactions  . Morphine     History   Social History  . Marital Status: Married    Spouse Name: N/A    Number of Children: N/A  . Years of Education: N/A   Occupational History  . part-time trucker    Social History Main Topics  . Smoking status: Former Smoker    Quit date: 01/13/1997  . Smokeless tobacco: Not on file  . Alcohol Use: No  . Drug Use: No  . Sexual Activity: Not on file   Other Topics Concern  . Not on file   Social History Narrative  . No narrative on file    Family History  Problem Relation Age of Onset  . Lung cancer Mother   .  Dementia Father     Review of Systems:  As stated in the HPI and otherwise negative.   BP 148/82  Pulse 61  Ht 5\' 11"  (1.803 m)  Wt 246 lb (111.585 kg)  BMI 34.33 kg/m2  Physical Examination: General: Well developed, well nourished, NAD HEENT: OP clear, mucus membranes moist SKIN: warm, dry. No rashes. Neuro: No focal deficits Musculoskeletal: Muscle strength 5/5 all ext Psychiatric: Mood and affect normal Neck: No JVD, no carotid bruits, no thyromegaly, no lymphadenopathy. Lungs:Clear bilaterally, no wheezes, rhonci, crackles Cardiovascular: Regular, brady. No murmurs, gallops or rubs. Abdomen:Soft. Bowel sounds present. Non-tender.  Extremities: No lower extremity edema. Pulses are 2 + in the bilateral DP/PT.  EKG: Sinus brady, rate 61 bpm. Non-specific ST abnormality.   Assessment and Plan:   1. CAD: Stable. Continue ASA, statin, beta blocker.  BP well controlled at home. His lipids are followed in primary care. Will arrange exercise myoview since he has had no recent ischemic testing.

## 2012-11-12 NOTE — Patient Instructions (Signed)
Your physician wants you to follow-up in:  12 months.  You will receive a reminder letter in the mail two months in advance. If you don't receive a letter, please call our office to schedule the follow-up appointment.  Your physician has requested that you have an exercise stress myoview. For further information please visit [email or web address]. Please follow instruction sheet, as given.  

## 2012-11-16 ENCOUNTER — Telehealth: Payer: Self-pay | Admitting: Nurse Practitioner

## 2012-11-19 NOTE — Telephone Encounter (Signed)
done

## 2012-12-01 ENCOUNTER — Encounter: Payer: Self-pay | Admitting: Cardiology

## 2012-12-01 ENCOUNTER — Ambulatory Visit (HOSPITAL_COMMUNITY): Payer: Medicare Other | Attending: Cardiovascular Disease | Admitting: Radiology

## 2012-12-01 ENCOUNTER — Encounter: Payer: Self-pay | Admitting: Cardiovascular Disease

## 2012-12-01 VITALS — BP 154/77 | Ht 71.0 in | Wt 244.0 lb

## 2012-12-01 DIAGNOSIS — E785 Hyperlipidemia, unspecified: Secondary | ICD-10-CM | POA: Insufficient documentation

## 2012-12-01 DIAGNOSIS — Z9861 Coronary angioplasty status: Secondary | ICD-10-CM | POA: Insufficient documentation

## 2012-12-01 DIAGNOSIS — I10 Essential (primary) hypertension: Secondary | ICD-10-CM | POA: Insufficient documentation

## 2012-12-01 DIAGNOSIS — Z951 Presence of aortocoronary bypass graft: Secondary | ICD-10-CM | POA: Insufficient documentation

## 2012-12-01 DIAGNOSIS — I2510 Atherosclerotic heart disease of native coronary artery without angina pectoris: Secondary | ICD-10-CM

## 2012-12-01 DIAGNOSIS — Z87891 Personal history of nicotine dependence: Secondary | ICD-10-CM | POA: Insufficient documentation

## 2012-12-01 DIAGNOSIS — E119 Type 2 diabetes mellitus without complications: Secondary | ICD-10-CM | POA: Insufficient documentation

## 2012-12-01 MED ORDER — TECHNETIUM TC 99M SESTAMIBI GENERIC - CARDIOLITE
10.0000 | Freq: Once | INTRAVENOUS | Status: AC | PRN
  Administered 2012-12-01: 10 via INTRAVENOUS

## 2012-12-01 MED ORDER — TECHNETIUM TC 99M SESTAMIBI GENERIC - CARDIOLITE
30.0000 | Freq: Once | INTRAVENOUS | Status: AC | PRN
  Administered 2012-12-01: 30 via INTRAVENOUS

## 2012-12-01 NOTE — Progress Notes (Signed)
[facility] SITE 3 NUCLEAR MED [address] [place], [place] [address] [phone]    Cardiology Nuclear Med Study  Jacob Rios is a 67 y.o. male     MRN : [id number]     DOB: Jan 01, 1946  Procedure Date: 12/01/2012  Nuclear Med Background Indication for Stress Test:  Evaluation for Ischemia, Graft Patency and Stent Patency History:  MPI,MI-Stent-PTCA-CABG-04/01/12 EF: 35% Cardiac Risk Factors: History of Smoking, Hypertension, Lipids and NIDDM  Symptoms:  n/a   Nuclear Pre-Procedure Caffeine/Decaff Intake:  None > 12 hrs NPO After: 5:30pm   Lungs:  clear O2 Sat: 97% on room air. IV 0.9% NS with Angio Cath:  20g  IV Site: R Antecubital x 1, tolerated well IV Started by:  Irean Hong, RN  Chest Size (in):  46 Cup Size: n/a  Height: 5\' 11"  (1.803 m)  Weight:  244 lb (110.678 kg)  BMI:  Body mass index is 34.05 kg/(m^2). Tech Comments:  Held Toprol x 48 hrs    Nuclear Med Study 1 or 2 day study: 1 day  Stress Test Type:  Stress  Reading MD: Kristeen Miss, MD  Order Authorizing Provider:  Verne Carrow, MD  Resting Radionuclide: Technetium 69m Sestamibi  Resting Radionuclide Dose: 11.0 mCi   Stress Radionuclide:  Technetium 81m Sestamibi  Stress Radionuclide Dose: 33.0 mCi           Stress Protocol Rest HR: 86 Stress HR: 134  Rest BP: 154/77 Stress BP: 173/71  Exercise Time (min): 7:00 METS: 8.50   Predicted Max HR: 154 bpm % Max HR: 87.01 bpm Rate Pressure Product: 21308   Dose of Adenosine (mg):  n/a Dose of Lexiscan: n/a mg  Dose of Atropine (mg): n/a Dose of Dobutamine: n/a mcg/kg/min (at max HR)  Stress Test Technologist: Milana Na, EMT-P  Nuclear Technologist:  Dario Guardian, CNMT     Rest Procedure:  Myocardial perfusion imaging was performed at rest 45 minutes following the intravenous administration of Technetium 75m Sestamibi. Rest ECG: NSR - Normal EKG  Stress Procedure:  The patient exercised on the treadmill utilizing the  Bruce Protocol for 7:00 minutes. The patient stopped due to fatigue and denied any chest pain.  Technetium 29m Sestamibi was injected at peak exercise and myocardial perfusion imaging was performed after a brief delay. Stress ECG: No significant change from baseline ECG  QPS Raw Data Images:  Normal; no motion artifact; normal heart/lung ratio. Stress Images:  Normal homogeneous uptake in all areas of the myocardium. Rest Images:  Normal homogeneous uptake in all areas of the myocardium. Subtraction (SDS):  No evidence of ischemia. Transient Ischemic Dilatation (Normal <1.22):  0.94 Lung/Heart Ratio (Normal <0.45):  0.44  Quantitative Gated Spect Images QGS EDV:  137 ml QGS ESV:  70 ml  Impression Exercise Capacity:  Good exercise capacity. BP Response:  Normal blood pressure response. Clinical Symptoms:  No significant symptoms noted. ECG Impression:  No significant ST segment change suggestive of ischemia. Comparison with Prior Nuclear Study: No images to compare  Overall Impression:  Normal stress nuclear study.  LV Ejection Fraction: 49%.  LV Wall Motion:  NL LV Function; NL Wall Motion.   Vesta Mixer, Montez Hageman., MD, [facility] 12/01/2012, 6:51 PM Office - [phone] Pager [phone]

## 2013-05-20 ENCOUNTER — Encounter: Payer: Self-pay | Admitting: Family Medicine

## 2013-05-20 ENCOUNTER — Ambulatory Visit (INDEPENDENT_AMBULATORY_CARE_PROVIDER_SITE_OTHER): Payer: 59 | Admitting: Family Medicine

## 2013-05-20 ENCOUNTER — Encounter (INDEPENDENT_AMBULATORY_CARE_PROVIDER_SITE_OTHER): Payer: Self-pay

## 2013-05-20 VITALS — BP 134/75 | HR 72 | Temp 97.5°F | Ht 71.0 in | Wt 247.0 lb

## 2013-05-20 DIAGNOSIS — E785 Hyperlipidemia, unspecified: Secondary | ICD-10-CM

## 2013-05-20 DIAGNOSIS — R5381 Other malaise: Secondary | ICD-10-CM

## 2013-05-20 DIAGNOSIS — Z125 Encounter for screening for malignant neoplasm of prostate: Secondary | ICD-10-CM

## 2013-05-20 DIAGNOSIS — R5383 Other fatigue: Secondary | ICD-10-CM

## 2013-05-20 DIAGNOSIS — I10 Essential (primary) hypertension: Secondary | ICD-10-CM

## 2013-05-20 DIAGNOSIS — E119 Type 2 diabetes mellitus without complications: Secondary | ICD-10-CM

## 2013-05-20 LAB — POCT CBC
Granulocyte percent: 72.5 %G (ref 37–80)
HCT, POC: 45.8 % (ref 43.5–53.7)
Hemoglobin: 15 g/dL (ref 14.1–18.1)
Lymph, poc: 1.6 (ref 0.6–3.4)
MCH, POC: 28.2 pg (ref 27–31.2)
MCHC: 32.8 g/dL (ref 31.8–35.4)
MCV: 86 fL (ref 80–97)
MPV: 7.3 fL (ref 0–99.8)
POC Granulocyte: 4.8 (ref 2–6.9)
POC LYMPH PERCENT: 24.2 %L (ref 10–50)
Platelet Count, POC: 166 10*3/uL (ref 142–424)
RBC: 5.3 M/uL (ref 4.69–6.13)
RDW, POC: 14.4 %
WBC: 6.6 10*3/uL (ref 4.6–10.2)

## 2013-05-20 LAB — POCT GLYCOSYLATED HEMOGLOBIN (HGB A1C): Hemoglobin A1C: 7.4

## 2013-05-20 LAB — POCT UA - MICROALBUMIN: Microalbumin Ur, POC: NEGATIVE mg/L

## 2013-05-20 MED ORDER — GLUCOSE BLOOD VI STRP
ORAL_STRIP | Status: DC
Start: 2013-05-20 — End: 2013-07-01

## 2013-05-20 NOTE — Progress Notes (Signed)
   Subjective:    Patient ID: Jacob Rios, male    DOB: 09-22-1945, 68 y.o.   MRN: [id number]  HPI This 68 y.o. male presents for evaluation of diabetes, hyperlipidemia, and hypertension.  He has hx Of DM.  He quit taking [place].  He states he saw TV advertisements against [place] and he stopped. He has hx of CAD and sees cardiology regularly.  He states his fasting glucose runs around 110.  He states his fsbs drops to 80 mid morning.   Review of Systems No chest pain, SOB, HA, dizziness, vision change, N/V, diarrhea, constipation, dysuria, urinary urgency or frequency, myalgias, arthralgias or rash.     Objective:   Physical Exam  Vital signs noted  Well developed well nourished male.  HEENT - Head atraumatic Normocephalic                Eyes - PERRLA, Conjuctiva - clear Sclera- Clear EOMI                Ears - EAC's Wnl TM's Wnl Gross Hearing WNL                Nose - Nares patent                 Throat - oropharanx wnl Respiratory - Lungs CTA bilateral Cardiac - RRR S1 and S2 without murmur GI - Abdomen soft Nontender and bowel sounds active x 4 Extremities - No edema. Neuro - Grossly intact.  Results for orders placed in visit on 05/20/13  POCT GLYCOSYLATED HEMOGLOBIN (HGB A1C)      Result Value Ref Range   Hemoglobin A1C 7.4%    POCT CBC      Result Value Ref Range   WBC 6.6  4.6 - 10.2 K/uL   Lymph, poc 1.6  0.6 - 3.4   POC LYMPH PERCENT 24.2  10 - 50 %L   MID (cbc)    0 - 0.9   POC MID %    0 - 12 %M   POC Granulocyte 4.8  2 - 6.9   Granulocyte percent 72.5  37 - 80 %G   RBC 5.3  4.69 - 6.13 M/uL   Hemoglobin 15.0  14.1 - 18.1 g/dL   HCT, POC 45.8  43.5 - 53.7 %   MCV 86.0  80 - 97 fL   MCH, POC 28.2  27 - 31.2 pg   MCHC 32.8  31.8 - 35.4 g/dL   RDW, POC 14.4     Platelet Count, POC 166.0  142 - 424 K/uL   MPV 7.3  0 - 99.8 fL  POCT UA - MICROALBUMIN      Result Value Ref Range   Microalbumin Ur, POC neg        Assessment & Plan:  Diabetes  mellitus, type 2 - Plan: POCT glycosylated hemoglobin (Hb A1C), CMP14+EGFR, Lipid panel, POCT UA - Microalbumin.  Instructed patient to decrease carbs in diet and exercise.  Hypertension - Plan: CMP14+EGFR  Hyperlipemia - Plan: CMP14+EGFR, Lipid panel  Fatigue - Plan: POCT CBC, CMP14+EGFR, Thyroid Panel With TSH, Vit D  25 hydroxy (rtn osteoporosis monitoring)  Screening for prostate cancer - Plan: PSA, total and free  Follow up in 3 months  Lysbeth Penner FNP

## 2013-05-20 NOTE — Addendum Note (Signed)
Addended by: Marin Olp on: 05/20/2013 02:28 PM   Modules accepted: Orders

## 2013-05-21 LAB — CMP14+EGFR
ALT: 23 IU/L (ref 0–44)
AST: 21 IU/L (ref 0–40)
Albumin/Globulin Ratio: 1.7 (ref 1.1–2.5)
Albumin: 4.5 g/dL (ref 3.6–4.8)
Alkaline Phosphatase: 82 IU/L (ref 39–117)
BUN/Creatinine Ratio: 13 (ref 10–22)
BUN: 18 mg/dL (ref 8–27)
CO2: 23 mmol/L (ref 18–29)
Calcium: 9.3 mg/dL (ref 8.6–10.2)
Chloride: 102 mmol/L (ref 97–108)
Creatinine, Ser: 1.37 mg/dL — ABNORMAL HIGH (ref 0.76–1.27)
GFR calc Af Amer: 62 mL/min/{1.73_m2} (ref >59)
GFR calc non Af Amer: 53 mL/min/{1.73_m2} — ABNORMAL LOW (ref >59)
Globulin, Total: 2.6 g/dL (ref 1.5–4.5)
Glucose: 132 mg/dL — ABNORMAL HIGH (ref 65–99)
Potassium: 5.4 mmol/L — ABNORMAL HIGH (ref 3.5–5.2)
Sodium: 139 mmol/L (ref 134–144)
Total Bilirubin: 0.9 mg/dL (ref 0.0–1.2)
Total Protein: 7.1 g/dL (ref 6.0–8.5)

## 2013-05-21 LAB — LIPID PANEL
Chol/HDL Ratio: 4.3 ratio units (ref 0.0–5.0)
Cholesterol, Total: 111 mg/dL (ref 100–199)
HDL: 26 mg/dL — ABNORMAL LOW (ref >39)
LDL Calculated: 62 mg/dL (ref 0–99)
Triglycerides: 116 mg/dL (ref 0–149)
VLDL Cholesterol Cal: 23 mg/dL (ref 5–40)

## 2013-05-21 LAB — THYROID PANEL WITH TSH
Free Thyroxine Index: 2.4 (ref 1.2–4.9)
T3 Uptake Ratio: 30 % (ref 24–39)
T4, Total: 8 ug/dL (ref 4.5–12.0)
TSH: 1.06 u[IU]/mL (ref 0.450–4.500)

## 2013-05-21 LAB — PSA, TOTAL AND FREE
PSA, Free Pct: 20.8 %
PSA, Free: 1.1 ng/mL
PSA: 5.3 ng/mL — ABNORMAL HIGH (ref 0.0–4.0)

## 2013-05-21 LAB — VITAMIN D 25 HYDROXY (VIT D DEFICIENCY, FRACTURES): Vit D, 25-Hydroxy: 29.3 ng/mL — ABNORMAL LOW (ref 30.0–100.0)

## 2013-05-25 ENCOUNTER — Telehealth: Payer: Self-pay | Admitting: Family Medicine

## 2013-05-25 NOTE — Telephone Encounter (Signed)
Message copied by Waverly Ferrari on Wed May 25, 2013  9:36 AM ------      Message from: Lysbeth Penner      Created: Tue May 24, 2013  4:38 PM       Aic is elevated and need to eat low carb diet and can follow up with Tammy Eckerd, vit d low and take otc, k is elevated and need to repeat k, ------

## 2013-05-31 ENCOUNTER — Other Ambulatory Visit: Payer: Self-pay | Admitting: Family Medicine

## 2013-05-31 ENCOUNTER — Ambulatory Visit: Payer: 59

## 2013-05-31 ENCOUNTER — Ambulatory Visit (INDEPENDENT_AMBULATORY_CARE_PROVIDER_SITE_OTHER): Payer: 59 | Admitting: Pharmacist Clinician (PhC)/ Clinical Pharmacy Specialist

## 2013-05-31 DIAGNOSIS — IMO0001 Reserved for inherently not codable concepts without codable children: Secondary | ICD-10-CM

## 2013-05-31 DIAGNOSIS — E1165 Type 2 diabetes mellitus with hyperglycemia: Principal | ICD-10-CM

## 2013-05-31 DIAGNOSIS — R972 Elevated prostate specific antigen [PSA]: Secondary | ICD-10-CM

## 2013-05-31 NOTE — Progress Notes (Signed)
   Subjective:    Patient ID: Jacob Rios, male    DOB: September 28, 1945, 68 y.o.   MRN: [id number]  HPI    Review of Systems     Objective:   Physical Exam        Assessment & Plan:   Diabetes Follow-Up Visit Chief Complaint:  No chief complaint on file.    Exam Regularity:  RRR Edema:  neg  Polyuria:  neg  Polydipsia:  neg Polyphagia:  neg  BMI:  There is no weight on file to calculate BMI.   Weight changes:  stable General Appearance:  alert, oriented, no acute distress and obese Mood/Affect:  normal  HPI:  Type 2 diabetes with past treatment with oral medications.  Quite taking Januvia based on tv add with warnings.  Low fat/carbohydrate diet?  No Nicotine Abuse?  No Medication Compliance?  Yes Exercise?  No Alcohol Abuse?  No  Home BG Monitoring:  Checking 1 times a day. Average:  140   High: 170  Low:  110   Lab Results  Component Value Date   HGBA1C 7.4% 05/20/2013    No results found for this basename: MICROALBUR, [id number]    Lab Results  Component Value Date   HDL 26* 05/20/2013   LDLCALC 62 05/20/2013   TRIG 116 05/20/2013   CHOLHDL 4.3 05/20/2013      Assessment: 1.  Diabetes.  Good control, would like to achieve A1C less than 7% 2.  Blood Pressure.  Checked at home with goal readings 3.  Lipids.  At goal on statin therapy 4.  Foot Care.  Reviewed home care 5.  Dental Care.  annual 6.  Eye Care/Exam.  annual  Recommendations: 1.  Patient is counseled on appropriate foot care. 2.  BP goal < 130/80. 3.  LDL goal of < 100, HDL > 40 and TG < 150. 4.  Eye Exam yearly and Dental Exam every 6 months. 5.  Dietary recommendations:  Counseled patient extensively on CHO meal planning.  Current diet is too high in CHO and fats. 6.  Physical Activity recommendations:  30 minutes a day 7.  Medication recommendations at this time are as follows:  Continue same for now 8.  Return to clinic in 4-6 wks 9.  Dietrich Pates spoke with patient about seeing a urologist  for his elevated PSA level.   Time spent counseling patient:  40 min  Physician time spent with patient:   5 minutes Referring provider:  Sallyanne Havers   PharmD:  [facility] Pharmacist

## 2013-06-02 ENCOUNTER — Telehealth: Payer: Self-pay | Admitting: Family Medicine

## 2013-06-09 ENCOUNTER — Encounter: Payer: Self-pay | Admitting: *Deleted

## 2013-06-13 ENCOUNTER — Telehealth: Payer: Self-pay | Admitting: Family Medicine

## 2013-06-13 ENCOUNTER — Other Ambulatory Visit: Payer: Self-pay | Admitting: Family Medicine

## 2013-06-13 ENCOUNTER — Telehealth: Payer: Self-pay

## 2013-06-13 DIAGNOSIS — R972 Elevated prostate specific antigen [PSA]: Secondary | ICD-10-CM

## 2013-06-13 NOTE — Telephone Encounter (Signed)
This was regarding referrals. And they spoke with patient

## 2013-06-13 NOTE — Telephone Encounter (Signed)
Patient wants a referral to urologist in [place]

## 2013-06-13 NOTE — Telephone Encounter (Signed)
Referral for urologist in [place] put in

## 2013-06-30 ENCOUNTER — Other Ambulatory Visit: Payer: Self-pay | Admitting: Nurse Practitioner

## 2013-06-30 MED ORDER — GLUCOSE BLOOD VI STRP: ORAL_STRIP | Status: DC

## 2013-07-01 ENCOUNTER — Other Ambulatory Visit: Payer: Self-pay | Admitting: Nurse Practitioner

## 2013-07-01 ENCOUNTER — Telehealth: Payer: Self-pay | Admitting: Nurse Practitioner

## 2013-07-01 MED ORDER — GLUCOSE BLOOD VI STRP: ORAL_STRIP | Status: AC

## 2013-07-08 ENCOUNTER — Other Ambulatory Visit: Payer: Self-pay | Admitting: Urology

## 2013-07-08 ENCOUNTER — Ambulatory Visit (INDEPENDENT_AMBULATORY_CARE_PROVIDER_SITE_OTHER): Payer: Medicare Other | Admitting: Urology

## 2013-07-08 DIAGNOSIS — R972 Elevated prostate specific antigen [PSA]: Secondary | ICD-10-CM

## 2013-07-08 DIAGNOSIS — N402 Nodular prostate without lower urinary tract symptoms: Secondary | ICD-10-CM

## 2013-07-28 ENCOUNTER — Other Ambulatory Visit: Payer: Self-pay | Admitting: Urology

## 2013-07-28 DIAGNOSIS — R972 Elevated prostate specific antigen [PSA]: Secondary | ICD-10-CM

## 2013-07-29 ENCOUNTER — Ambulatory Visit (HOSPITAL_COMMUNITY)
Admission: RE | Admit: 2013-07-29 | Discharge: 2013-07-29 | Disposition: A | Discharge status: Home / Self Care | Payer: Medicare Other | Source: Ambulatory Visit | Attending: Urology | Admitting: Urology

## 2013-07-29 DIAGNOSIS — R972 Elevated prostate specific antigen [PSA]: Secondary | ICD-10-CM

## 2013-07-29 MED ORDER — LIDOCAINE HCL (PF) 2 % IJ SOLN
10.0000 mL | Freq: Once | INTRAMUSCULAR | Status: AC
  Administered 2013-07-29: 10 mL

## 2013-07-29 MED ORDER — CEFTRIAXONE SODIUM 1 G IJ SOLR
INTRAMUSCULAR | Status: AC
  Administered 2013-07-29: 1 g via INTRAMUSCULAR
  Filled 2013-07-29: qty 10

## 2013-07-29 MED ORDER — LIDOCAINE HCL (PF) 1 % IJ SOLN
INTRAMUSCULAR | Status: AC
  Administered 2013-07-29: 5 mL
  Filled 2013-07-29: qty 5

## 2013-07-29 MED ORDER — CEFTRIAXONE SODIUM 1 G IJ SOLR
1.0000 g | Freq: Once | INTRAMUSCULAR | Status: AC
  Administered 2013-07-29: 1 g via INTRAMUSCULAR
  Filled 2013-07-29: qty 10

## 2013-07-29 MED ORDER — LIDOCAINE HCL (PF) 2 % IJ SOLN
INTRAMUSCULAR | Status: AC
  Administered 2013-07-29: 10 mL
  Filled 2013-07-29: qty 10

## 2013-07-29 NOTE — Discharge Instructions (Signed)
Transrectal Ultrasound-Guided Biopsy °A transrectal ultrasound-guided biopsy is a procedure to take samples of tissue from your prostate. Ultrasound images are used to guide the procedure. It is usually done to check the prostate gland for cancer. °BEFORE THE PROCEDURE °· Do not eat or drink after midnight on the night before your procedure. °· Take medicines as your doctor tells you. °· Your doctor may have you stop taking some medicines 5-7 days before the procedure. °· You will be given an enema before your procedure. During an enema, a liquid is put into your butt (rectum) to clear out waste. °· You may have lab tests the day of your procedure. °· Make plans to have someone drive you home. °PROCEDURE °· You will be given medicine to help you relax before the procedure. An IV tube will be put into one of your veins. It will be used to give fluids and medicine. °· You will be given medicine to reduce the risk of infection (antibiotic). °· You will be placed on your side. °· A probe with gel will be put in your butt. This is used to take pictures of your prostate and the area around it. °· A medicine to numb the area is put into your prostate. °· A biopsy needle is then inserted and guided to your prostate. °· Samples of prostate tissue are taken. The needle is removed. °· The samples are sent to a lab to be checked. Results are usually back in 2-3 days. °AFTER THE PROCEDURE °· You will be taken to a room where you will be watched until you are doing okay. °· You may have some pain in the area around your butt. You will be given medicines for this. °· You may be able to go home the same day. Sometimes, an overnight stay in the hospital is needed. °Document Released: 12/18/2008 Document Revised: 01/04/2013 Document Reviewed: 08/18/2012 °ExitCare® Patient Information ©2015 [employer]. This information is not intended to replace advice given to you by your health care provider. Make sure you discuss any questions  you have with your health care provider. ° °

## 2013-08-01 ENCOUNTER — Telehealth: Payer: Self-pay | Admitting: *Deleted

## 2013-08-01 MED ORDER — GLIPIZIDE ER 10 MG PO TB24: 10.0000 mg | ORAL_TABLET | Freq: Every day | ORAL | Status: DC

## 2013-08-01 NOTE — Telephone Encounter (Signed)
Refilled meds to pharmacy

## 2013-08-23 ENCOUNTER — Other Ambulatory Visit: Payer: Self-pay | Admitting: *Deleted

## 2013-08-23 MED ORDER — OMEPRAZOLE 40 MG PO CPDR: 40.0000 mg | DELAYED_RELEASE_CAPSULE | Freq: Every day | ORAL | Status: DC

## 2013-10-24 ENCOUNTER — Ambulatory Visit (INDEPENDENT_AMBULATORY_CARE_PROVIDER_SITE_OTHER): Payer: 59

## 2013-10-24 DIAGNOSIS — Z23 Encounter for immunization: Secondary | ICD-10-CM

## 2013-11-06 ENCOUNTER — Other Ambulatory Visit: Payer: Self-pay | Admitting: Nurse Practitioner

## 2013-11-08 ENCOUNTER — Telehealth: Payer: Self-pay | Admitting: Nurse Practitioner

## 2013-11-08 NOTE — Telephone Encounter (Signed)
done

## 2013-11-18 ENCOUNTER — Other Ambulatory Visit: Payer: Self-pay | Admitting: Nurse Practitioner

## 2013-11-18 ENCOUNTER — Ambulatory Visit (INDEPENDENT_AMBULATORY_CARE_PROVIDER_SITE_OTHER): Payer: 59 | Admitting: Family Medicine

## 2013-11-18 ENCOUNTER — Other Ambulatory Visit: Payer: Self-pay | Admitting: Cardiovascular Disease

## 2013-11-18 ENCOUNTER — Encounter: Payer: Self-pay | Admitting: Family Medicine

## 2013-11-18 VITALS — BP 138/80 | HR 67 | Temp 97.4°F | Ht 71.0 in | Wt 249.2 lb

## 2013-11-18 DIAGNOSIS — R972 Elevated prostate specific antigen [PSA]: Secondary | ICD-10-CM

## 2013-11-18 DIAGNOSIS — I2510 Atherosclerotic heart disease of native coronary artery without angina pectoris: Secondary | ICD-10-CM

## 2013-11-18 DIAGNOSIS — K219 Gastro-esophageal reflux disease without esophagitis: Secondary | ICD-10-CM

## 2013-11-18 DIAGNOSIS — I10 Essential (primary) hypertension: Secondary | ICD-10-CM

## 2013-11-18 DIAGNOSIS — E785 Hyperlipidemia, unspecified: Secondary | ICD-10-CM

## 2013-11-18 DIAGNOSIS — E119 Type 2 diabetes mellitus without complications: Secondary | ICD-10-CM

## 2013-11-18 DIAGNOSIS — Z024 Encounter for examination for driving license: Secondary | ICD-10-CM

## 2013-11-18 LAB — POCT URINALYSIS DIPSTICK
Bilirubin, UA: NEGATIVE
Bilirubin, UA: NEGATIVE
Blood, UA: NEGATIVE
Blood, UA: NEGATIVE
Glucose, UA: NEGATIVE
Glucose, UA: NEGATIVE
Ketones, UA: NEGATIVE
Ketones, UA: NEGATIVE
Leukocytes, UA: NEGATIVE
Leukocytes, UA: NEGATIVE
Nitrite, UA: NEGATIVE
Nitrite, UA: NEGATIVE
Protein, UA: NEGATIVE
Protein, UA: NEGATIVE
Spec Grav, UA: 1.01
Spec Grav, UA: 1.01
Urobilinogen, UA: NEGATIVE
Urobilinogen, UA: NEGATIVE
pH, UA: 6.5
pH, UA: 6.5

## 2013-11-18 LAB — POCT GLYCOSYLATED HEMOGLOBIN (HGB A1C): Hemoglobin A1C: 7.2

## 2013-11-18 LAB — POCT UA - MICROALBUMIN: Microalbumin Ur, POC: 20 mg/L

## 2013-11-18 MED ORDER — METOPROLOL SUCCINATE ER 50 MG PO TB24: 50.0000 mg | ORAL_TABLET | Freq: Every day | ORAL | Status: DC

## 2013-11-18 MED ORDER — ATORVASTATIN CALCIUM 40 MG PO TABS: 40.0000 mg | ORAL_TABLET | Freq: Every day | ORAL | Status: DC

## 2013-11-18 MED ORDER — OMEPRAZOLE 40 MG PO CPDR: 40.0000 mg | DELAYED_RELEASE_CAPSULE | Freq: Every day | ORAL | Status: DC

## 2013-11-18 MED ORDER — GLIPIZIDE ER 10 MG PO TB24: 10.0000 mg | ORAL_TABLET | Freq: Every day | ORAL | Status: DC

## 2013-11-18 MED ORDER — METFORMIN HCL 1000 MG PO TABS: 1000.0000 mg | ORAL_TABLET | Freq: Two times a day (BID) | ORAL | Status: DC

## 2013-11-18 MED ORDER — METFORMIN HCL 500 MG PO TABS: ORAL_TABLET | ORAL | Status: DC

## 2013-11-18 NOTE — Progress Notes (Signed)
Subjective:    Patient ID: Jacob Rios, male    DOB: [birth year], 68 y.o.   MRN: [id number]  HPI Patient is here for 3 month follow up  Review of Systems  Constitutional: Negative for fever.  HENT: Negative for ear pain.   Eyes: Negative for discharge.  Respiratory: Negative for cough.   Cardiovascular: Negative for chest pain.  Gastrointestinal: Negative for abdominal distention.  Endocrine: Negative for polyuria.  Genitourinary: Negative for difficulty urinating.  Musculoskeletal: Negative for gait problem and neck pain.  Skin: Negative for color change and rash.  Neurological: Negative for speech difficulty and headaches.  Psychiatric/Behavioral: Negative for agitation.       Objective:    BP 164/83 mmHg  Pulse 67  Temp(Src) 97.4 F (36.3 C) (Oral)  Ht 5' 11" (1.803 m)  Wt 249 lb 3.2 oz (113.036 kg)  BMI 34.77 kg/m2 Physical Exam  Constitutional: He is oriented to person, place, and time. He appears well-developed and well-nourished.  HENT:  Head: Normocephalic and atraumatic.  Mouth/Throat: Oropharynx is clear and moist.  Eyes: Pupils are equal, round, and reactive to light.  Neck: Normal range of motion. Neck supple.  Cardiovascular: Normal rate and regular rhythm.   No murmur heard. Pulmonary/Chest: Effort normal and breath sounds normal.  Abdominal: Soft. Bowel sounds are normal. There is no tenderness.  Neurological: He is alert and oriented to person, place, and time.  Skin: Skin is warm and dry.  Psychiatric: He has a normal mood and affect.         Assessment & Plan:     ICD-9-CM ICD-10-CM   1. Hyperlipemia 272.4 E78.5 CMP14+EGFR     Lipid panel  2. Diabetes mellitus type 2, controlled, without complications 086.76 P95.0 POCT glycosylated hemoglobin (Hb A1C)     POCT UA - Microalbumin     CMP14+EGFR  3. Essential hypertension, benign 401.1 I10 POCT CBC     CMP14+EGFR  4. Elevated PSA 790.93 R97.2 PSA, total and free     No Follow-up on  file.  Lysbeth Penner FNP

## 2013-11-19 ENCOUNTER — Other Ambulatory Visit: Payer: Self-pay | Admitting: Family Medicine

## 2013-11-19 LAB — CBC WITH DIFFERENTIAL
Basophils Absolute: 0.1 10*3/uL (ref 0.0–0.2)
Basos: 1 %
Eos: 6 %
Eosinophils Absolute: 0.3 10*3/uL (ref 0.0–0.4)
HCT: 45.3 % (ref 37.5–51.0)
Hemoglobin: 15.4 g/dL (ref 12.6–17.7)
Immature Grans (Abs): 0 10*3/uL (ref 0.0–0.1)
Immature Granulocytes: 0 %
Lymphocytes Absolute: 1.4 10*3/uL (ref 0.7–3.1)
Lymphs: 23 %
MCH: 28.6 pg (ref 26.6–33.0)
MCHC: 34 g/dL (ref 31.5–35.7)
MCV: 84 fL (ref 79–97)
Monocytes Absolute: 0.4 10*3/uL (ref 0.1–0.9)
Monocytes: 7 %
Neutrophils Absolute: 3.9 10*3/uL (ref 1.4–7.0)
Neutrophils Relative %: 63 %
Platelets: 155 10*3/uL (ref 150–379)
RBC: 5.39 x10E6/uL (ref 4.14–5.80)
RDW: 14.1 % (ref 12.3–15.4)
WBC: 6.1 10*3/uL (ref 3.4–10.8)

## 2013-11-19 LAB — CMP14+EGFR
ALT: 21 IU/L (ref 0–44)
AST: 14 IU/L (ref 0–40)
Albumin/Globulin Ratio: 1.6 (ref 1.1–2.5)
Albumin: 4.4 g/dL (ref 3.6–4.8)
Alkaline Phosphatase: 76 IU/L (ref 39–117)
BUN/Creatinine Ratio: 10 (ref 10–22)
BUN: 14 mg/dL (ref 8–27)
CO2: 26 mmol/L (ref 18–29)
Calcium: 9.5 mg/dL (ref 8.6–10.2)
Chloride: 98 mmol/L (ref 97–108)
Creatinine, Ser: 1.45 mg/dL — ABNORMAL HIGH (ref 0.76–1.27)
GFR calc Af Amer: 57 mL/min/{1.73_m2} — ABNORMAL LOW (ref >59)
GFR calc non Af Amer: 49 mL/min/{1.73_m2} — ABNORMAL LOW (ref >59)
Globulin, Total: 2.8 g/dL (ref 1.5–4.5)
Glucose: 145 mg/dL — ABNORMAL HIGH (ref 65–99)
Potassium: 5 mmol/L (ref 3.5–5.2)
Sodium: 139 mmol/L (ref 134–144)
Total Bilirubin: 0.7 mg/dL (ref 0.0–1.2)
Total Protein: 7.2 g/dL (ref 6.0–8.5)

## 2013-11-19 LAB — MICROALBUMIN, URINE: Microalbumin, Urine: 42 ug/mL — ABNORMAL HIGH (ref 0.0–17.0)

## 2013-11-19 LAB — LIPID PANEL
Chol/HDL Ratio: 4.5 ratio units (ref 0.0–5.0)
Cholesterol, Total: 134 mg/dL (ref 100–199)
HDL: 30 mg/dL — ABNORMAL LOW (ref >39)
LDL Calculated: 69 mg/dL (ref 0–99)
Triglycerides: 174 mg/dL — ABNORMAL HIGH (ref 0–149)
VLDL Cholesterol Cal: 35 mg/dL (ref 5–40)

## 2013-11-19 LAB — PSA, TOTAL AND FREE
PSA, Free Pct: 20 %
PSA, Free: 1 ng/mL
PSA: 5 ng/mL — ABNORMAL HIGH (ref 0.0–4.0)

## 2013-11-21 ENCOUNTER — Other Ambulatory Visit: Payer: Self-pay | Admitting: Family Medicine

## 2013-11-21 MED ORDER — LISINOPRIL 2.5 MG PO TABS: 2.5000 mg | ORAL_TABLET | Freq: Every day | ORAL | Status: DC

## 2013-12-02 ENCOUNTER — Ambulatory Visit (INDEPENDENT_AMBULATORY_CARE_PROVIDER_SITE_OTHER): Payer: Medicare Other | Admitting: Urology

## 2013-12-02 DIAGNOSIS — C61 Malignant neoplasm of prostate: Secondary | ICD-10-CM

## 2013-12-16 ENCOUNTER — Ambulatory Visit (INDEPENDENT_AMBULATORY_CARE_PROVIDER_SITE_OTHER): Payer: Medicare Other | Admitting: Cardiovascular Disease

## 2013-12-16 ENCOUNTER — Encounter: Payer: Self-pay | Admitting: Cardiovascular Disease

## 2013-12-16 VITALS — BP 141/81 | HR 59 | Ht 71.0 in | Wt 252.0 lb

## 2013-12-16 DIAGNOSIS — I10 Essential (primary) hypertension: Secondary | ICD-10-CM

## 2013-12-16 DIAGNOSIS — I2510 Atherosclerotic heart disease of native coronary artery without angina pectoris: Secondary | ICD-10-CM

## 2013-12-16 DIAGNOSIS — E785 Hyperlipidemia, unspecified: Secondary | ICD-10-CM

## 2013-12-16 MED ORDER — ATORVASTATIN CALCIUM 40 MG PO TABS: 40.0000 mg | ORAL_TABLET | Freq: Every day | ORAL | Status: DC

## 2013-12-16 MED ORDER — METOPROLOL SUCCINATE ER 50 MG PO TB24: 50.0000 mg | ORAL_TABLET | Freq: Every day | ORAL | Status: DC

## 2013-12-16 NOTE — Patient Instructions (Signed)
Your physician wants you to follow-up in:  12 months.  You will receive a reminder letter in the mail two months in advance. If you don't receive a letter, please call our office to schedule the follow-up appointment.   

## 2013-12-16 NOTE — Progress Notes (Addendum)
History of Present Illness: 68 yo male with history of CAD s/p CABG 2000, HTN, HLD, DM and former tobacco abuse here today for cardiac follow up. He has been followed in the past by Dr. Olevia Perches. In 2000 he had an MI with subsequent bypass surgery (last cath 2005 with occluded LIMA to LAD-native LAD open, patent SVG to diagonal and patent RIMA to RCA). He quit smoking in 1999. He has now fully retired. He was a Emergency planning/management officer for 28 years. Stress myoview 12/01/12 with no ischemia.   He has had no recent chest pain, shortness of breath or palpitations.   Primary Care Physician: Dr. Laurance Flatten  Last Lipid Profile:  Lipid Panel     Component Value Date/Time   TRIG 174* 11/18/2013 0834   TRIG 96 07/30/2012 1158   HDL 30* 11/18/2013 0834   CHOLHDL 4.5 11/18/2013 0834   LDLCALC 69 11/18/2013 0834   LDLCALC 64 07/30/2012 1158    Past Medical History  Diagnosis Date  . Hypertension   . Hyperlipidemia   . Diabetes mellitus   . Coronary artery disease     3V CABG 2000. Last heart cath 2005, with 2 patent grafts    Past Surgical History  Procedure Laterality Date  . Coronary artery bypass graft      3V in 2000  . Hernia repair    . Appendectomy      Current Outpatient Prescriptions  Medication Sig Dispense Refill  . aspirin 325 MG tablet Take 81 mg by mouth daily.     Marland Kitchen atorvastatin (LIPITOR) 40 MG tablet Take 1 tablet (40 mg total) by mouth daily. 30 tablet 11  . glipiZIDE (GLUCOTROL XL) 10 MG 24 hr tablet Take 1 tablet (10 mg total) by mouth daily. 30 tablet 11  . glucose blood (ONETOUCH VERIO) test strip Test 1X per day and as needed  Dx 250.02 100 each 12  . glucose blood test strip Test 1X a day and PRN--dx 250.02 100 each 12  . Lancets (ONETOUCH ULTRASOFT) lancets Patient test 1X per day and prn  Dx 250.02 100 each 12  . lisinopril (PRINIVIL,ZESTRIL) 2.5 MG tablet Take 1 tablet (2.5 mg total) by mouth daily. 30 tablet 11  . metFORMIN (GLUCOPHAGE) 1000 MG tablet Take 1 tablet  (1,000 mg total) by mouth 2 (two) times daily with a meal. (Patient taking differently: Take 500 mg by mouth 2 (two) times daily with a meal. ) 30 tablet 11  . metoprolol succinate (TOPROL-XL) 50 MG 24 hr tablet Take 1 tablet (50 mg total) by mouth daily. 30 tablet 11  . Omega-3 Fatty Acids (FISH OIL) 1000 MG CAPS Take 1 capsule by mouth daily.     Marland Kitchen omeprazole (PRILOSEC) 40 MG capsule Take 1 capsule (40 mg total) by mouth daily. 30 capsule 3  . vitamin C (ASCORBIC ACID) 500 MG tablet Take 500 mg by mouth daily.       No current facility-administered medications for this visit.    Allergies  Allergen Reactions  . Morphine     History   Social History  . Marital Status: Married    Spouse Name: N/A    Number of Children: N/A  . Years of Education: N/A   Occupational History  . part-time trucker    Social History Main Topics  . Smoking status: Former Smoker    Quit date: 01/13/1997  . Smokeless tobacco: Not on file  . Alcohol Use: No  . Drug Use: No  .  Sexual Activity: Not on file   Other Topics Concern  . Not on file   Social History Narrative    Family History  Problem Relation Age of Onset  . Lung cancer Mother   . Dementia Father   . Heart attack Neg Hx   . Stroke Neg Hx   . Hyperlipidemia Father   . Cancer Mother     Review of Systems:  As stated in the HPI and otherwise negative.   BP 141/81 mmHg  Pulse 59  Ht 5\' 11"  (1.803 m)  Wt 252 lb (114.306 kg)  BMI 35.16 kg/m2  Physical Examination: General: Well developed, well nourished, NAD HEENT: OP clear, mucus membranes moist SKIN: warm, dry. No rashes. Neuro: No focal deficits Musculoskeletal: Muscle strength 5/5 all ext Psychiatric: Mood and affect normal Neck: No JVD, no carotid bruits, no thyromegaly, no lymphadenopathy. Lungs:Clear bilaterally, no wheezes, rhonci, crackles Cardiovascular: Regular, brady. No murmurs, gallops or rubs. Abdomen:Soft. Bowel sounds present. Non-tender.  Extremities:  No lower extremity edema. Pulses are 2 + in the bilateral DP/PT.  EKG:   Stress myoview 12/01/12: Stress Procedure: The patient exercised on the treadmill utilizing the Bruce Protocol for 7:00 minutes. The patient stopped due to fatigue and denied any chest pain. Technetium 45m Sestamibi was injected at peak exercise and myocardial perfusion imaging was performed after a brief delay. Stress ECG: No significant change from baseline ECG  QPS Raw Data Images: Normal; no motion artifact; normal heart/lung ratio. Stress Images: Normal homogeneous uptake in all areas of the myocardium. Rest Images: Normal homogeneous uptake in all areas of the myocardium. Subtraction (SDS): No evidence of ischemia. Transient Ischemic Dilatation (Normal <1.22): 0.94 Lung/Heart Ratio (Normal <0.45): 0.44  Quantitative Gated Spect Images QGS EDV: 137 ml QGS ESV: 70 ml  Impression Exercise Capacity: Good exercise capacity. BP Response: Normal blood pressure response. Clinical Symptoms: No significant symptoms noted. ECG Impression: No significant ST segment change suggestive of ischemia. Comparison with Prior Nuclear Study: No images to compare  Overall Impression: Normal stress nuclear study.  LV Ejection Fraction: 49%. LV Wall Motion: NL LV Function; NL Wall Motion.  EKG: sinus brady, rate 59 bpm.   Assessment and Plan:   1. CAD: Stable. Continue ASA, statin, beta blocker.  Stress myoview 12/01/13 with no ischemia.  2. HTN: BP controlled. Continue current meds.   3. Hyperlipidemia:  Lipids well controlled. Continue statin

## 2014-03-08 ENCOUNTER — Other Ambulatory Visit: Payer: Self-pay | Admitting: Family Medicine

## 2014-03-09 NOTE — Telephone Encounter (Signed)
Left patient a voicemail to call back and schedule a follow up appointment.

## 2014-03-17 ENCOUNTER — Ambulatory Visit (INDEPENDENT_AMBULATORY_CARE_PROVIDER_SITE_OTHER): Payer: Medicare Other | Admitting: Urology

## 2014-03-17 DIAGNOSIS — N402 Nodular prostate without lower urinary tract symptoms: Secondary | ICD-10-CM

## 2014-03-17 DIAGNOSIS — R972 Elevated prostate specific antigen [PSA]: Secondary | ICD-10-CM | POA: Diagnosis not present

## 2014-03-17 DIAGNOSIS — C61 Malignant neoplasm of prostate: Secondary | ICD-10-CM

## 2014-04-05 ENCOUNTER — Ambulatory Visit (INDEPENDENT_AMBULATORY_CARE_PROVIDER_SITE_OTHER): Payer: Medicare Other | Admitting: Family Medicine

## 2014-04-05 ENCOUNTER — Encounter: Payer: Self-pay | Admitting: Family Medicine

## 2014-04-05 VITALS — BP 149/89 | HR 78 | Temp 97.4°F | Ht 71.0 in | Wt 253.0 lb

## 2014-04-05 DIAGNOSIS — I10 Essential (primary) hypertension: Secondary | ICD-10-CM | POA: Diagnosis not present

## 2014-04-05 DIAGNOSIS — E785 Hyperlipidemia, unspecified: Secondary | ICD-10-CM | POA: Diagnosis not present

## 2014-04-05 DIAGNOSIS — E119 Type 2 diabetes mellitus without complications: Secondary | ICD-10-CM

## 2014-04-05 DIAGNOSIS — I25119 Atherosclerotic heart disease of native coronary artery with unspecified angina pectoris: Secondary | ICD-10-CM | POA: Diagnosis not present

## 2014-04-05 LAB — POCT CBC
Granulocyte percent: 72 %G (ref 37–80)
HEMATOCRIT: 45.2 % (ref 43.5–53.7)
HEMOGLOBIN: 14.2 g/dL (ref 14.1–18.1)
Lymph, poc: 1.4 (ref 0.6–3.4)
MCH: 27.2 pg (ref 27–31.2)
MCHC: 31.5 g/dL — AB (ref 31.8–35.4)
MCV: 86.4 fL (ref 80–97)
MPV: 7.6 fL (ref 0–99.8)
POC GRANULOCYTE: 4.3 (ref 2–6.9)
POC LYMPH PERCENT: 22.5 %L (ref 10–50)
Platelet Count, POC: 157 10*3/uL (ref 142–424)
RBC: 5.23 M/uL (ref 4.69–6.13)
RDW, POC: 14.2 %
WBC: 6 10*3/uL (ref 4.6–10.2)

## 2014-04-05 LAB — POCT GLYCOSYLATED HEMOGLOBIN (HGB A1C): HEMOGLOBIN A1C: 7.3

## 2014-04-05 MED ORDER — SITAGLIP PHOS-METFORMIN HCL ER 100-1000 MG PO TB24: 30.0000 mg | ORAL_TABLET | Freq: Every day | ORAL | Status: DC

## 2014-04-05 MED ORDER — OMEPRAZOLE 40 MG PO CPDR: 40.0000 mg | DELAYED_RELEASE_CAPSULE | Freq: Every day | ORAL | Status: DC

## 2014-04-05 NOTE — Progress Notes (Signed)
Subjective:  Patient ID: Jacob Rios, male    DOB: 1945/01/26  Age: 69 y.o. MRN: [id number]  CC: Diabetes; Hyperlipidemia; and Hypertension   HPI Jacob Rios presents for *Follow-up of diabetes. Patient does not check blood sugar at home Patient denies symptoms such as polyuria, polydipsia, excessive hunger, nausea No significant hypoglycemic spells noted. Medications as noted below. Taking them regularly without complication/adverse reaction being reported today.    follow-up of hypertension. Patient has no history of headache chest pain or shortness of breath or recent cough. Patient also denies symptoms of TIA such as numbness weakness lateralizing. Patient checks  blood pressure at home and has not had any elevated readings recently. Patient denies side effects from his medication. States taking it regularly.   Patient in for follow-up of elevated cholesterol. Doing well without complaints on current medication. Denies side effects of statin including myalgia and arthralgia and nausea. Also in today for liver function testing. Currently no chest pain, shortness of breath or other cardiovascular related symptoms noted.  The patient has a history of coronary artery disease and has had some vague intermittent chest pain followed by cardiology. There has been no radiation diaphoresis or nausea associated. There has been no shortness of breath. And there has been no acceleration of symptoms frequency or duration or intensity. History Jacob Rios has a past medical history of Hypertension; Hyperlipidemia; Diabetes mellitus; and Coronary artery disease.   He has past surgical history that includes Coronary artery bypass graft; Hernia repair; and Appendectomy.   His family history includes Cancer in his mother; Dementia in his father; Hyperlipidemia in his father; Lung cancer in his mother. There is no history of Heart attack or Stroke.He reports that he quit smoking about 17 years ago. He does  not have any smokeless tobacco history on file. He reports that he does not drink alcohol or use illicit drugs.  Current Outpatient Prescriptions on File Prior to Visit  Medication Sig Dispense Refill  . aspirin 325 MG tablet Take 81 mg by mouth daily.     Marland Kitchen atorvastatin (LIPITOR) 40 MG tablet Take 1 tablet (40 mg total) by mouth daily. 30 tablet 11  . glucose blood (ONETOUCH VERIO) test strip Test 1X per day and as needed  Dx 250.02 100 each 12  . glucose blood test strip Test 1X a day and PRN--dx 250.02 100 each 12  . Lancets (ONETOUCH ULTRASOFT) lancets Patient test 1X per day and prn  Dx 250.02 100 each 12  . lisinopril (PRINIVIL,ZESTRIL) 2.5 MG tablet Take 1 tablet (2.5 mg total) by mouth daily. 30 tablet 11  . metoprolol succinate (TOPROL-XL) 50 MG 24 hr tablet Take 1 tablet (50 mg total) by mouth daily. 30 tablet 11  . Omega-3 Fatty Acids (FISH OIL) 1000 MG CAPS Take 1 capsule by mouth daily.     . vitamin C (ASCORBIC ACID) 500 MG tablet Take 500 mg by mouth daily.       No current facility-administered medications on file prior to visit.    ROS Review of Systems  Constitutional: Negative for fever, chills, diaphoresis and unexpected weight change.  HENT: Negative for congestion, hearing loss, rhinorrhea, sore throat and trouble swallowing.   Respiratory: Negative for cough, chest tightness, shortness of breath and wheezing.   Gastrointestinal: Negative for nausea, vomiting, abdominal pain, diarrhea, constipation and abdominal distention.  Endocrine: Negative for cold intolerance and heat intolerance.  Genitourinary: Negative for dysuria, hematuria and flank pain.  Musculoskeletal: Negative for joint  swelling and arthralgias.  Skin: Negative for rash.  Neurological: Negative for dizziness and headaches.  Psychiatric/Behavioral: Negative for dysphoric mood, decreased concentration and agitation. The patient is not nervous/anxious.     Objective:  BP 149/89 mmHg  Pulse 78   Temp(Src) 97.4 F (36.3 C) (Oral)  Ht '5\' 11"'  (1.803 m)  Wt 253 lb (114.76 kg)  BMI 35.30 kg/m2  Physical Exam  Constitutional: He is oriented to person, place, and time. He appears well-developed and well-nourished. No distress.  HENT:  Head: Normocephalic and atraumatic.  Right Ear: External ear normal.  Left Ear: External ear normal.  Nose: Nose normal.  Mouth/Throat: Oropharynx is clear and moist.  Eyes: Conjunctivae and EOM are normal. Pupils are equal, round, and reactive to light.  Neck: Normal range of motion. Neck supple. No thyromegaly present.  Cardiovascular: Normal rate, regular rhythm and normal heart sounds.   No murmur heard. Pulmonary/Chest: Effort normal and breath sounds normal. No respiratory distress. He has no wheezes. He has no rales.  Abdominal: Soft. Bowel sounds are normal. He exhibits no distension. There is no tenderness.  Lymphadenopathy:    He has no cervical adenopathy.  Neurological: He is alert and oriented to person, place, and time. He has normal reflexes.  Skin: Skin is warm and dry.  Psychiatric: He has a normal mood and affect. His behavior is normal. Judgment and thought content normal.    Assessment & Plan:   Jacob Rios was seen today for diabetes, hyperlipidemia and hypertension.  Diagnoses and all orders for this visit:  Diabetes mellitus without complication Orders: -     POCT glycosylated hemoglobin (Hb A1C)  Coronary artery disease with unspecified angina pectoris  Essential hypertension Orders: -     POCT CBC -     CMP14+EGFR  Hyperlipemia Orders: -     Lipid panel  Other orders -     omeprazole (PRILOSEC) 40 MG capsule; Take 1 capsule (40 mg total) by mouth daily. -     Discontinue: SitaGLIPtin-MetFORMIN HCl (JANUMET XR) [phone] MG TB24; Take 30 mg by mouth daily. For diabetes (Patient taking differently: Take 100 mg by mouth daily. For diabetes)   I have changed Jacob Rios omeprazole. I am also having him maintain his  Fish Oil, vitamin C, aspirin, onetouch ultrasoft, glucose blood, glucose blood, lisinopril, metoprolol succinate, and atorvastatin.  Meds ordered this encounter  Medications  . omeprazole (PRILOSEC) 40 MG capsule    Sig: Take 1 capsule (40 mg total) by mouth daily.    Dispense:  90 capsule    Refill:  4  . DISCONTD: SitaGLIPtin-MetFORMIN HCl (JANUMET XR) [phone] MG TB24    Sig: Take 30 mg by mouth daily. For diabetes    Dispense:  30 tablet    Refill:  5     Follow-up: Return in about 3 months (around 07/06/2014).  Claretta Fraise, M.D.

## 2014-04-06 ENCOUNTER — Telehealth: Payer: Self-pay | Admitting: *Deleted

## 2014-04-06 LAB — LIPID PANEL
CHOL/HDL RATIO: 4.4 ratio (ref 0.0–5.0)
Cholesterol, Total: 131 mg/dL (ref 100–199)
HDL: 30 mg/dL — ABNORMAL LOW (ref >39)
LDL CALC: 75 mg/dL (ref 0–99)
TRIGLYCERIDES: 129 mg/dL (ref 0–149)
VLDL Cholesterol Cal: 26 mg/dL (ref 5–40)

## 2014-04-06 LAB — CMP14+EGFR
ALK PHOS: 80 IU/L (ref 39–117)
ALT: 21 IU/L (ref 0–44)
AST: 19 IU/L (ref 0–40)
Albumin/Globulin Ratio: 1.7 (ref 1.1–2.5)
Albumin: 4.2 g/dL (ref 3.6–4.8)
BILIRUBIN TOTAL: 0.5 mg/dL (ref 0.0–1.2)
BUN / CREAT RATIO: 10 (ref 10–22)
BUN: 14 mg/dL (ref 8–27)
CO2: 25 mmol/L (ref 18–29)
CREATININE: 1.37 mg/dL — AB (ref 0.76–1.27)
Calcium: 9.3 mg/dL (ref 8.6–10.2)
Chloride: 99 mmol/L (ref 97–108)
GFR calc Af Amer: 61 mL/min/{1.73_m2} (ref >59)
GFR, EST NON AFRICAN AMERICAN: 53 mL/min/{1.73_m2} — AB (ref >59)
GLOBULIN, TOTAL: 2.5 g/dL (ref 1.5–4.5)
Glucose: 119 mg/dL — ABNORMAL HIGH (ref 65–99)
Potassium: 4.6 mmol/L (ref 3.5–5.2)
Sodium: 139 mmol/L (ref 134–144)
Total Protein: 6.7 g/dL (ref 6.0–8.5)

## 2014-04-06 MED ORDER — SITAGLIP PHOS-METFORMIN HCL ER 100-1000 MG PO TB24: 1.0000 | ORAL_TABLET | Freq: Every day | ORAL | Status: DC

## 2014-04-06 NOTE — Progress Notes (Signed)
Patient aware.

## 2014-04-06 NOTE — Telephone Encounter (Signed)
Received fax from [facility] that Janumet XR [phone] was sent to pharmacy yesterday and they are questioning the dose and directions. Please review and if incorrect please send corrected rx to pharmacy. Thank you

## 2014-04-10 ENCOUNTER — Telehealth: Payer: Self-pay | Admitting: Family Medicine

## 2014-04-10 ENCOUNTER — Other Ambulatory Visit: Payer: Self-pay | Admitting: *Deleted

## 2014-04-10 DIAGNOSIS — E119 Type 2 diabetes mellitus without complications: Secondary | ICD-10-CM

## 2014-04-10 MED ORDER — METFORMIN HCL 1000 MG PO TABS: 1000.0000 mg | ORAL_TABLET | Freq: Two times a day (BID) | ORAL | Status: DC

## 2014-04-10 MED ORDER — GLIPIZIDE ER 10 MG PO TB24
10.0000 mg | ORAL_TABLET | Freq: Every day | ORAL | Status: DC
Start: 2014-04-10 — End: 2015-04-12

## 2014-04-10 NOTE — Telephone Encounter (Signed)
Please make the change he requested

## 2014-05-22 ENCOUNTER — Ambulatory Visit (INDEPENDENT_AMBULATORY_CARE_PROVIDER_SITE_OTHER): Payer: Medicare Other | Admitting: Family

## 2014-05-22 ENCOUNTER — Encounter: Payer: Self-pay | Admitting: Family

## 2014-05-22 ENCOUNTER — Telehealth: Payer: Self-pay | Admitting: Family

## 2014-05-22 VITALS — BP 144/82 | HR 82 | Temp 97.9°F | Ht 71.0 in | Wt 252.0 lb

## 2014-05-22 DIAGNOSIS — L0100 Impetigo, unspecified: Secondary | ICD-10-CM

## 2014-05-22 MED ORDER — MUPIROCIN 2 % EX OINT: 1.0000 "application " | TOPICAL_OINTMENT | Freq: Two times a day (BID) | CUTANEOUS | Status: DC

## 2014-05-22 NOTE — Telephone Encounter (Signed)
Patients wife aware and verbalizes understanding.

## 2014-05-22 NOTE — Progress Notes (Signed)
   Subjective:    Patient ID: Jacob Rios, male    DOB: 1945/11/05, 69 y.o.   MRN: [id number]  HPI Pt presents to the office today for a lesion on his right outer ear. Pt states it has been there for about a month. Pt has cleaned it was peroxide, iodine, and "medicated soap" and it "still has not gone away". Pt states it has not worsen, but it has not gotten better. Pt denies any pain unless it is "pushed on".    Review of Systems  Constitutional: Negative.   HENT: Negative.   Respiratory: Negative.   Cardiovascular: Negative.   Gastrointestinal: Negative.   Endocrine: Negative.   Genitourinary: Negative.   Musculoskeletal: Negative.   Neurological: Negative.   Hematological: Negative.   Psychiatric/Behavioral: Negative.   All other systems reviewed and are negative.      Objective:   Physical Exam  Constitutional: He is oriented to person, place, and time. He appears well-developed and well-nourished. No distress.  HENT:  Head: Normocephalic.  Right Ear: External ear normal.  Left Ear: External ear normal.  Mouth/Throat: Oropharynx is clear and moist.  Eyes: Pupils are equal, round, and reactive to light. Right eye exhibits no discharge. Left eye exhibits no discharge.  Neck: Normal range of motion. Neck supple. No thyromegaly present.  Cardiovascular: Normal rate, regular rhythm, normal heart sounds and intact distal pulses.   No murmur heard. Pulmonary/Chest: Effort normal and breath sounds normal. No respiratory distress. He has no wheezes.  Abdominal: Soft. Bowel sounds are normal. He exhibits no distension. There is no tenderness.  Musculoskeletal: Normal range of motion. He exhibits no edema or tenderness.  Neurological: He is alert and oriented to person, place, and time. He has normal reflexes. No cranial nerve deficit.  Skin: Skin is warm and dry. Lesion (yellow crusted lesion on right ear) noted. No rash noted. No erythema.  Psychiatric: He has a normal mood and  affect. His behavior is normal. Judgment and thought content normal.  Vitals reviewed.   BP 144/82 mmHg  Pulse 82  Temp(Src) 97.9 F (36.6 C) (Oral)  Ht 5\' 11"  (1.803 m)  Wt 252 lb (114.306 kg)  BMI 35.16 kg/m2       Assessment & Plan:  1. Impetigo -Good hand hygiene -Keep clean and dry -RTO prn - mupirocin ointment (BACTROBAN) 2 %; Place 1 application into the nose 2 (two) times daily.  Dispense: 22 g; Refill: 0   Evelina Dun, FNP

## 2014-05-22 NOTE — Telephone Encounter (Signed)
Sent RX with wrong instructions. Pt to put ointment directly on lesion.

## 2014-05-22 NOTE — Patient Instructions (Signed)
Bacitracin skin ointment What is this medicine? BACITRACIN (bass i TRAY sin) is a polypeptide antibiotic. It is used to treat bacterial skin infections or to prevent infection of minor burns, cuts, or scrapes. This medicine may be used for other purposes; ask your health care provider or pharmacist if you have questions. What should I tell my health care provider before I take this medicine? They need to know if you have any of these conditions: -animal bites -large areas of damaged skin -puncture wound -severe burns -an unusual or allergic reaction to bacitracin, neomycin, other antibiotics, other medicines, foods, dyes, or preservatives -pregnant or trying to get pregnant -breast-feeding How should I use this medicine? This medicine is only for external use on the skin. Follow the directions on the prescription label. Wash hands before and after use. Apply a thin layer to cover the affected area. You can cover the treated area with a sterile gauze dressing (bandage). Use this medicine at regular intervals. Do not use more often than directed. Finish the full course prescribed by your doctor or health care professional even if you think you are better. Do not stop using except on your doctor's advice. Talk to your pediatrician regarding the use of this medicine in children. Special care may be needed. Overdosage: If you think you have taken too much of this medicine contact a poison control center or emergency room at once. NOTE: This medicine is only for you. Do not share this medicine with others. What if I miss a dose? If you miss a dose, use it as soon as you can. If it is almost time for your next dose, use only that dose. Do not use double or extra doses. What may interact with this medicine? Interactions are not expected. Do not use other skin care products unless your doctor or health care professional tells you to. This list may not describe all possible interactions. Give your health  care provider a list of all the medicines, herbs, non-prescription drugs, or dietary supplements you use. Also tell them if you smoke, drink alcohol, or use illegal drugs. Some items may interact with your medicine. What should I watch for while using this medicine? Tell your doctor or health care professional if the infection does not get better within 1 week or if they get worse. Do not get this medicine in your eyes. If you do, rinse out with plenty of cool tap water. What side effects may I notice from receiving this medicine? Side effects that you should report to your doctor or health care professional as soon as possible: -allergic reactions like skin rash, itching or hives, swelling of the face, lips, or tongue -breathing problems -chest tightness -lower back pain -pain, difficulty passing urine Side effects that usually do not require medical attention (report to your doctor or health care professional if they continue or are bothersome): -skin irritation This list may not describe all possible side effects. Call your doctor for medical advice about side effects. You may report side effects to FDA at 1-800-FDA-1088. Where should I keep my medicine? Keep out of the reach of children. Store at room temperature between 15 and 30 degrees C (59 and 86 degrees F). Protect from light. Throw away any unused medicine after the expiration date. NOTE: This sheet is a summary. It may not cover all possible information. If you have questions about this medicine, talk to your doctor, pharmacist, or health care provider.  2015, Elsevier/Gold Standard. (2007-03-31 14:59:46)

## 2014-06-16 ENCOUNTER — Other Ambulatory Visit: Payer: Self-pay | Admitting: Urology

## 2014-06-16 ENCOUNTER — Ambulatory Visit (INDEPENDENT_AMBULATORY_CARE_PROVIDER_SITE_OTHER): Payer: Medicare Other | Admitting: Urology

## 2014-06-16 DIAGNOSIS — R972 Elevated prostate specific antigen [PSA]: Secondary | ICD-10-CM | POA: Diagnosis not present

## 2014-06-16 DIAGNOSIS — C61 Malignant neoplasm of prostate: Secondary | ICD-10-CM

## 2014-06-16 DIAGNOSIS — N402 Nodular prostate without lower urinary tract symptoms: Secondary | ICD-10-CM | POA: Diagnosis not present

## 2014-06-19 ENCOUNTER — Encounter: Payer: Self-pay | Admitting: Physician Assistant

## 2014-06-19 ENCOUNTER — Ambulatory Visit (INDEPENDENT_AMBULATORY_CARE_PROVIDER_SITE_OTHER): Payer: Medicare Other | Admitting: Physician Assistant

## 2014-06-19 VITALS — BP 137/78 | HR 70 | Temp 98.1°F | Ht 71.0 in | Wt 254.0 lb

## 2014-06-19 DIAGNOSIS — H6191 Disorder of right external ear, unspecified: Secondary | ICD-10-CM

## 2014-06-19 DIAGNOSIS — D485 Neoplasm of uncertain behavior of skin: Secondary | ICD-10-CM

## 2014-06-19 NOTE — Progress Notes (Signed)
   Subjective:    Patient ID: Jacob Rios, male    DOB: 1946-01-03, 69 y.o.   MRN: [id number]  HPI 69 y/o nmale presents with lesion on right ear. He was seen 3 weeks ago and wasgiven mupirocin , tid for impetigo. He has had no resolution in lesion on right ear.     Review of Systems  Skin: Positive for wound (enlarging lesion on right ear, bleeds when he picks at it. negative for pain ).       Objective:   Physical Exam  Skin:  Hyperkeratotic nodule on external right auricle          Assessment & Plan:  1. Lesion of right earlobe - Shave to r/o BCC/SCC/CNH - Pathology   Continue to apply mupirocin BID TID until follow up/healed.   Miquel Stacks A. Benjamin Stain PA-C

## 2014-06-22 LAB — PATHOLOGY

## 2014-06-23 ENCOUNTER — Telehealth: Payer: Self-pay | Admitting: Physician Assistant

## 2014-06-23 NOTE — Telephone Encounter (Signed)
Tiffany, i see results - but i'm not sure what to tell him

## 2014-06-23 NOTE — Telephone Encounter (Signed)
Stp and he is aware of results and has appt scheduled with Tiffany 6/13 at 11:40.

## 2014-06-23 NOTE — Telephone Encounter (Signed)
Florentina Jenny will call. Shatara Stanek A. Benjamin Stain PA-C

## 2014-06-26 ENCOUNTER — Ambulatory Visit (INDEPENDENT_AMBULATORY_CARE_PROVIDER_SITE_OTHER): Payer: Medicare Other | Admitting: Physician Assistant

## 2014-06-26 ENCOUNTER — Encounter: Payer: Self-pay | Admitting: Physician Assistant

## 2014-06-26 VITALS — BP 143/81 | HR 72 | Temp 96.9°F | Ht 71.0 in | Wt 254.0 lb

## 2014-06-26 DIAGNOSIS — C44222 Squamous cell carcinoma of skin of right ear and external auricular canal: Secondary | ICD-10-CM

## 2014-06-26 DIAGNOSIS — L570 Actinic keratosis: Secondary | ICD-10-CM

## 2014-06-26 MED ORDER — SULFAMETHOXAZOLE-TRIMETHOPRIM 400-80 MG PO TABS: 1.0000 | ORAL_TABLET | Freq: Two times a day (BID) | ORAL | Status: DC

## 2014-06-26 NOTE — Progress Notes (Signed)
69 y/o male presents for treatment of diagnosed via biopsy Squamous cell carcinoma on right earlobe, approximately 1 cm in size.   A/P: SCC was treated via dessication and curettage. Treated size approximately 2cm in diameter on right auricle. Monsels applied post procedure. Dressing applied. Advised patient to apply Mupirocin BID in addition to vaseline. Wash with gentle soap and water ( Dove) Will rx Bactrim SS BID x 10 days. F/U if any redness, pain or edema occurs.   Areas of acitnic changes, precancerous, were also treated with cryosurgery on right side of face and left ear x 5 . Suggest fsc yearly   James Senn A. Benjamin Stain PA-C

## 2014-07-19 ENCOUNTER — Ambulatory Visit: Payer: Medicare Other | Admitting: Family Medicine

## 2014-07-20 ENCOUNTER — Encounter: Payer: Self-pay | Admitting: Nurse Practitioner

## 2014-07-20 ENCOUNTER — Ambulatory Visit (INDEPENDENT_AMBULATORY_CARE_PROVIDER_SITE_OTHER): Payer: Medicare Other | Admitting: Nurse Practitioner

## 2014-07-20 VITALS — BP 146/87 | HR 90 | Temp 97.6°F | Ht 71.0 in | Wt 252.0 lb

## 2014-07-20 DIAGNOSIS — E785 Hyperlipidemia, unspecified: Secondary | ICD-10-CM | POA: Diagnosis not present

## 2014-07-20 DIAGNOSIS — Z6835 Body mass index (BMI) 35.0-35.9, adult: Secondary | ICD-10-CM | POA: Diagnosis not present

## 2014-07-20 DIAGNOSIS — E1169 Type 2 diabetes mellitus with other specified complication: Secondary | ICD-10-CM | POA: Insufficient documentation

## 2014-07-20 DIAGNOSIS — I25810 Atherosclerosis of coronary artery bypass graft(s) without angina pectoris: Secondary | ICD-10-CM

## 2014-07-20 DIAGNOSIS — I10 Essential (primary) hypertension: Secondary | ICD-10-CM

## 2014-07-20 DIAGNOSIS — Z1212 Encounter for screening for malignant neoplasm of rectum: Secondary | ICD-10-CM | POA: Diagnosis not present

## 2014-07-20 DIAGNOSIS — Z23 Encounter for immunization: Secondary | ICD-10-CM | POA: Diagnosis not present

## 2014-07-20 DIAGNOSIS — E119 Type 2 diabetes mellitus without complications: Secondary | ICD-10-CM | POA: Diagnosis not present

## 2014-07-20 LAB — POCT GLYCOSYLATED HEMOGLOBIN (HGB A1C): Hemoglobin A1C: 7.1

## 2014-07-20 NOTE — Progress Notes (Signed)
Subjective:    Patient ID: Jacob Rios, male    DOB: [birth year], 69 y.o.   MRN: [id number]  Hypertension This is a chronic problem. The current episode started more than 1 year ago. The problem is unchanged. The problem is controlled. Pertinent negatives include no headaches, malaise/fatigue, orthopnea or palpitations. Risk factors for coronary artery disease include diabetes mellitus, dyslipidemia, obesity, post-menopausal state and sedentary lifestyle. Past treatments include ACE inhibitors and beta blockers. The current treatment provides moderate improvement. Compliance problems include diet and exercise.  Hypertensive end-organ damage includes CAD/MI.  Hyperlipidemia This is a chronic problem. The current episode started more than 1 year ago. Recent lipid tests were reviewed and are variable. Exacerbating diseases include obesity. He has no history of diabetes or hypothyroidism. Current antihyperlipidemic treatment includes diet change and statins. The current treatment provides moderate improvement of lipids. Compliance problems include adherence to diet and adherence to exercise.  Risk factors for coronary artery disease include dyslipidemia, diabetes mellitus, hypertension, male sex and obesity.  Diabetes He presents for his follow-up diabetic visit. He has type 2 diabetes mellitus. Pertinent negatives for hypoglycemia include no headaches. There are no diabetic associated symptoms. Symptoms are stable. Risk factors for coronary artery disease include diabetes mellitus, dyslipidemia, hypertension, male sex and obesity. Current diabetic treatment includes oral agent (dual therapy). He is compliant with treatment all of the time. His weight is stable. When asked about meal planning, he reported none. He has not had a previous visit with a dietitian. He rarely participates in exercise. His breakfast blood glucose is taken between 8-9 am. His breakfast blood glucose range is generally 110-130  mg/dl. His overall blood glucose range is 110-130 mg/dl. An ACE inhibitor/angiotensin II receptor blocker is being taken. He does not see a podiatrist.Eye exam is not current.  GERD Omeprazole 40 mg daily- keeps symptoms under control   Review of Systems  Constitutional: Negative.  Negative for malaise/fatigue.  HENT: Negative.   Respiratory: Negative.   Cardiovascular: Negative for palpitations and orthopnea.  Genitourinary: Negative.   Neurological: Negative.  Negative for headaches.  Psychiatric/Behavioral: Negative.        Objective:   Physical Exam  Constitutional: He is oriented to person, place, and time. He appears well-developed and well-nourished.  HENT:  Head: Normocephalic.  Right Ear: External ear normal.  Left Ear: External ear normal.  Nose: Nose normal.  Mouth/Throat: Oropharynx is clear and moist.  Eyes: EOM are normal. Pupils are equal, round, and reactive to light.  Neck: Normal range of motion. Neck supple. No JVD present. No thyromegaly present.  Cardiovascular: Normal rate, regular rhythm, normal heart sounds and intact distal pulses.  Exam reveals no gallop and no friction rub.   No murmur heard. Pulmonary/Chest: Effort normal and breath sounds normal. No respiratory distress. He has no wheezes. He has no rales. He exhibits no tenderness.  Abdominal: Soft. Bowel sounds are normal. He exhibits no mass. There is no tenderness.  Genitourinary: Prostate normal and penis normal.  Musculoskeletal: Normal range of motion. He exhibits no edema.  Lymphadenopathy:    He has no cervical adenopathy.  Neurological: He is alert and oriented to person, place, and time. No cranial nerve deficit.  Skin: Skin is warm and dry.  Psychiatric: He has a normal mood and affect. His behavior is normal. Judgment and thought content normal.    BP 146/87 mmHg  Pulse 90  Temp(Src) 97.6 F (36.4 C) (Oral)  Ht _0  (1.803 m)  Wt  252 lb (114.306 kg)  BMI 35.16 kg/m2  Results  for orders placed or performed in visit on 07/20/14  POCT glycosylated hemoglobin (Hb A1C)  Result Value Ref Range   Hemoglobin A1C 7.1         Assessment & Plan:  1. Hyperlipemia Low fat diet - Lipid panel  2. Essential hypertension Do not add salt to diet - CMP14+EGFR  3. Coronary atherosclerosis of autologous vein bypass graft without angina Keep follow up with cardiologits  4. Type 2 diabetes mellitus without complication Watch carbs in diet - POCT glycosylated hemoglobin (Hb A1C)  5. BMI 35.0-35.9,adult Discussed diet and exercise for person with BMI >25 Will recheck weight in 3-6 months   6. Screening for malignant neoplasm of the rectum hemoccult cards given to patient with directions - Fecal occult blood, imunochemical; Future    Labs pending Health maintenance reviewed Diet and exercise encouraged Continue all meds Follow up  In 3 months   [place], FNP

## 2014-07-20 NOTE — Addendum Note (Signed)
Addended by: Rolena Infante on: 07/20/2014 11:57 AM   Modules accepted: Orders

## 2014-07-20 NOTE — Patient Instructions (Signed)
Exercise to Stay Healthy Exercise helps you become and stay healthy. EXERCISE IDEAS AND TIPS Choose exercises that:  You enjoy.  Fit into your day. You do not need to exercise really hard to be healthy. You can do exercises at a slow or medium level and stay healthy. You can:  Stretch before and after working out.  Try yoga, Pilates, or tai chi.  Lift weights.  Walk fast, swim, jog, run, climb stairs, bicycle, dance, or rollerskate.  Take aerobic classes. Exercises that burn about 150 calories:  Running 1  miles in 15 minutes.  Playing volleyball for 45 to 60 minutes.  Washing and waxing a car for 45 to 60 minutes.  Playing touch football for 45 minutes.  Walking 1  miles in 35 minutes.  Pushing a stroller 1  miles in 30 minutes.  Playing basketball for 30 minutes.  Raking leaves for 30 minutes.  Bicycling 5 miles in 30 minutes.  Walking 2 miles in 30 minutes.  Dancing for 30 minutes.  Shoveling snow for 15 minutes.  Swimming laps for 20 minutes.  Walking up stairs for 15 minutes.  Bicycling 4 miles in 15 minutes.  Gardening for 30 to 45 minutes.  Jumping rope for 15 minutes.  Washing windows or floors for 45 to 60 minutes. Document Released: 02/01/2010 Document Revised: 03/24/2011 Document Reviewed: 02/01/2010 Exit[employer] Patient Information 2015 [employer]. This information is not intended to replace advice given to you by your health care provider. Make sure you discuss any questions you have with your health care provider.  

## 2014-07-21 LAB — CMP14+EGFR
ALBUMIN: 4.4 g/dL (ref 3.6–4.8)
ALT: 20 IU/L (ref 0–44)
AST: 17 IU/L (ref 0–40)
Albumin/Globulin Ratio: 1.6 (ref 1.1–2.5)
Alkaline Phosphatase: 71 IU/L (ref 39–117)
BILIRUBIN TOTAL: 0.4 mg/dL (ref 0.0–1.2)
BUN/Creatinine Ratio: 12 (ref 10–22)
BUN: 18 mg/dL (ref 8–27)
CALCIUM: 9.5 mg/dL (ref 8.6–10.2)
CHLORIDE: 99 mmol/L (ref 97–108)
CO2: 24 mmol/L (ref 18–29)
Creatinine, Ser: 1.49 mg/dL — ABNORMAL HIGH (ref 0.76–1.27)
GFR calc non Af Amer: 48 mL/min/{1.73_m2} — ABNORMAL LOW (ref >59)
GFR, EST AFRICAN AMERICAN: 55 mL/min/{1.73_m2} — AB (ref >59)
GLUCOSE: 109 mg/dL — AB (ref 65–99)
Globulin, Total: 2.7 g/dL (ref 1.5–4.5)
Potassium: 5 mmol/L (ref 3.5–5.2)
Sodium: 139 mmol/L (ref 134–144)
TOTAL PROTEIN: 7.1 g/dL (ref 6.0–8.5)

## 2014-07-21 LAB — LIPID PANEL
CHOLESTEROL TOTAL: 157 mg/dL (ref 100–199)
Chol/HDL Ratio: 4.6 ratio units (ref 0.0–5.0)
HDL: 34 mg/dL — ABNORMAL LOW (ref >39)
LDL Calculated: 84 mg/dL (ref 0–99)
TRIGLYCERIDES: 197 mg/dL — AB (ref 0–149)
VLDL Cholesterol Cal: 39 mg/dL (ref 5–40)

## 2014-08-14 ENCOUNTER — Other Ambulatory Visit: Payer: Self-pay | Admitting: Urology

## 2014-08-22 ENCOUNTER — Other Ambulatory Visit: Payer: Self-pay | Admitting: Urology

## 2014-08-22 DIAGNOSIS — C61 Malignant neoplasm of prostate: Secondary | ICD-10-CM

## 2014-08-25 ENCOUNTER — Encounter (HOSPITAL_COMMUNITY): Payer: Self-pay

## 2014-08-25 ENCOUNTER — Ambulatory Visit (HOSPITAL_COMMUNITY)
Admission: RE | Admit: 2014-08-25 | Discharge: 2014-08-25 | Disposition: A | Discharge status: Home / Self Care | Payer: Medicare Other | Source: Ambulatory Visit | Attending: Urology | Admitting: Urology

## 2014-08-25 DIAGNOSIS — C61 Malignant neoplasm of prostate: Secondary | ICD-10-CM | POA: Diagnosis present

## 2014-08-25 MED ORDER — LIDOCAINE HCL (PF) 2 % IJ SOLN
INTRAMUSCULAR | Status: AC
  Filled 2014-08-25: qty 10

## 2014-08-25 MED ORDER — CEFTRIAXONE SODIUM 1 G IJ SOLR
1.0000 g | INTRAMUSCULAR | Status: DC
  Administered 2014-08-25: 1 g via INTRAMUSCULAR

## 2014-08-25 MED ORDER — CEFTRIAXONE SODIUM 1 G IJ SOLR: 1.0000 g | INTRAMUSCULAR | Status: DC

## 2014-08-25 NOTE — Sedation Documentation (Signed)
Patient denies pain and is resting comfortably.  

## 2014-08-25 NOTE — Sedation Documentation (Signed)
Pt tolerating biopsy without any difficulty.

## 2014-10-16 ENCOUNTER — Ambulatory Visit: Payer: Medicare Other | Admitting: Family Medicine

## 2014-10-16 ENCOUNTER — Ambulatory Visit (INDEPENDENT_AMBULATORY_CARE_PROVIDER_SITE_OTHER): Payer: Medicare Other | Admitting: *Deleted

## 2014-10-16 DIAGNOSIS — Z23 Encounter for immunization: Secondary | ICD-10-CM

## 2014-10-31 ENCOUNTER — Ambulatory Visit: Payer: Medicare Other | Admitting: Nurse Practitioner

## 2014-11-07 ENCOUNTER — Encounter: Payer: Self-pay | Admitting: Nurse Practitioner

## 2014-11-07 ENCOUNTER — Ambulatory Visit (INDEPENDENT_AMBULATORY_CARE_PROVIDER_SITE_OTHER): Payer: Medicare Other | Admitting: Nurse Practitioner

## 2014-11-07 VITALS — BP 142/82 | HR 73 | Temp 97.0°F | Ht 71.0 in | Wt 252.0 lb

## 2014-11-07 DIAGNOSIS — I25810 Atherosclerosis of coronary artery bypass graft(s) without angina pectoris: Secondary | ICD-10-CM

## 2014-11-07 DIAGNOSIS — Z6835 Body mass index (BMI) 35.0-35.9, adult: Secondary | ICD-10-CM | POA: Diagnosis not present

## 2014-11-07 DIAGNOSIS — E119 Type 2 diabetes mellitus without complications: Secondary | ICD-10-CM | POA: Diagnosis not present

## 2014-11-07 DIAGNOSIS — Z1212 Encounter for screening for malignant neoplasm of rectum: Secondary | ICD-10-CM | POA: Diagnosis not present

## 2014-11-07 DIAGNOSIS — I10 Essential (primary) hypertension: Secondary | ICD-10-CM

## 2014-11-07 DIAGNOSIS — E785 Hyperlipidemia, unspecified: Secondary | ICD-10-CM

## 2014-11-07 LAB — POCT GLYCOSYLATED HEMOGLOBIN (HGB A1C): Hemoglobin A1C: 6.8

## 2014-11-07 MED ORDER — ATORVASTATIN CALCIUM 40 MG PO TABS: 40.0000 mg | ORAL_TABLET | Freq: Every day | ORAL | Status: DC

## 2014-11-07 NOTE — Addendum Note (Signed)
Addended by: [name] on: 11/07/2014 12:12 PM   Modules accepted: Orders

## 2014-11-07 NOTE — Progress Notes (Addendum)
Subjective:    Patient ID: Jacob Rios, male    DOB: 1945/03/11, 69 y.o.   MRN: [id number]  HPI: Pt here for management of chronic medical problems. No complaints today   Hypertension This is a chronic problem. The current episode started more than 1 year ago. The problem is unchanged. The problem is controlled. Pertinent negatives include no headaches, malaise/fatigue, orthopnea or palpitations. Risk factors for coronary artery disease include diabetes mellitus, dyslipidemia, obesity, post-menopausal state and sedentary lifestyle. Past treatments include ACE inhibitors and beta blockers. The current treatment provides moderate improvement. Compliance problems include diet and exercise.  Hypertensive end-organ damage includes CAD/MI.  Hyperlipidemia This is a chronic problem. The current episode started more than 1 year ago. Recent lipid tests were reviewed and are variable. Exacerbating diseases include obesity. He has no history of diabetes or hypothyroidism. Current antihyperlipidemic treatment includes diet change and statins. The current treatment provides moderate improvement of lipids. Compliance problems include adherence to diet and adherence to exercise.  Risk factors for coronary artery disease include dyslipidemia, diabetes mellitus, hypertension, male sex and obesity.  Diabetes He presents for his follow-up diabetic visit. He has type 2 diabetes mellitus. Pertinent negatives for hypoglycemia include no headaches. There are no diabetic associated symptoms. Symptoms are stable. Risk factors for coronary artery disease include diabetes mellitus, dyslipidemia, hypertension, male sex and obesity. Current diabetic treatment includes oral agent (dual therapy). He is compliant with treatment all of the time. His weight is stable. When asked about meal planning, he reported none. He has not had a previous visit with a dietitian. He rarely participates in exercise. His breakfast blood glucose is  taken between 8-9 am. His breakfast blood glucose range is generally 110-130 mg/dl. His overall blood glucose range is 110-130 mg/dl. An ACE inhibitor/angiotensin II receptor blocker is being taken. He does not see a podiatrist.Eye exam is not current.  GERD Omeprazole 40 mg daily- keeps symptoms under control CAD Sees cardiologist every 6 months.   Review of Systems  Constitutional: Negative.  Negative for malaise/fatigue.  HENT: Negative.   Respiratory: Negative.   Cardiovascular: Negative for palpitations and orthopnea.  Genitourinary: Negative.   Neurological: Negative.  Negative for headaches.  Psychiatric/Behavioral: Negative.        Objective:   Physical Exam  Constitutional: He is oriented to person, place, and time. He appears well-developed and well-nourished.  HENT:  Head: Normocephalic.  Right Ear: External ear normal.  Left Ear: External ear normal.  Nose: Nose normal.  Mouth/Throat: Oropharynx is clear and moist.  Eyes: EOM are normal. Pupils are equal, round, and reactive to light.  Neck: Normal range of motion. Neck supple. No JVD present. No thyromegaly present.  Cardiovascular: Normal rate, regular rhythm, normal heart sounds and intact distal pulses.  Exam reveals no gallop and no friction rub.   No murmur heard. Pulmonary/Chest: Effort normal and breath sounds normal. No respiratory distress. He has no wheezes. He has no rales. He exhibits no tenderness.  Abdominal: Soft. Bowel sounds are normal. He exhibits no mass. There is no tenderness.  Genitourinary: Prostate normal and penis normal.  Musculoskeletal: Normal range of motion. He exhibits no edema.  Lymphadenopathy:    He has no cervical adenopathy.  Neurological: He is alert and oriented to person, place, and time. No cranial nerve deficit.  Skin: Skin is warm and dry.  Psychiatric: He has a normal mood and affect. His behavior is normal. Judgment and thought content normal.    BP  142/82 mmHg  Pulse  73  Temp(Src) 97 F (36.1 C) (Oral)  Ht $R'5\' 11"'es$  (1.803 m)  Wt 252 lb (114.306 kg)  BMI 35.16 kg/m2  Results for orders placed or performed in visit on 11/07/14  POCT glycosylated hemoglobin (Hb A1C)  Result Value Ref Range   Hemoglobin A1C 6.8         Assessment & Plan:  1. Hyperlipemia Low fat diet and excerise - Lipid panel - atorvastatin (LIPITOR) 40 MG tablet; Take 1 tablet (40 mg total) by mouth daily.  Dispense: 30 tablet; Refill: 6  2. Essential hypertension No added salt to diet - CMP14+EGFR  3. Type 2 diabetes mellitus without complication, without long-term current use of insulin (HCC) Continue to check CBGs  Low carb diet - POCT glycosylated hemoglobin (Hb A1C)  4. BMI 35.0-35.9,adult Exercise at least 3 times a week for 30 minutes  5. CAD Keep follow up appointments with cardiologist  6. Colon cancer screening Given hemoccult cards  Continue all meds Labs pending Health Maintenance reviewed Diet and exercise encouraged RTO 3 months  [name], FNP

## 2014-11-07 NOTE — Patient Instructions (Signed)

## 2014-11-08 LAB — CMP14+EGFR
A/G RATIO: 1.6 (ref 1.1–2.5)
ALBUMIN: 4.2 g/dL (ref 3.6–4.8)
ALT: 20 IU/L (ref 0–44)
AST: 16 IU/L (ref 0–40)
Alkaline Phosphatase: 78 IU/L (ref 39–117)
BUN/Creatinine Ratio: 13 (ref 10–22)
BUN: 16 mg/dL (ref 8–27)
Bilirubin Total: 0.4 mg/dL (ref 0.0–1.2)
CO2: 28 mmol/L (ref 18–29)
Calcium: 9.6 mg/dL (ref 8.6–10.2)
Chloride: 101 mmol/L (ref 97–106)
Creatinine, Ser: 1.23 mg/dL (ref 0.76–1.27)
GFR calc non Af Amer: 60 mL/min/{1.73_m2} (ref >59)
GFR, EST AFRICAN AMERICAN: 69 mL/min/{1.73_m2} (ref >59)
GLOBULIN, TOTAL: 2.7 g/dL (ref 1.5–4.5)
Glucose: 117 mg/dL — ABNORMAL HIGH (ref 65–99)
POTASSIUM: 4.8 mmol/L (ref 3.5–5.2)
Sodium: 143 mmol/L (ref 136–144)
Total Protein: 6.9 g/dL (ref 6.0–8.5)

## 2014-11-08 LAB — LIPID PANEL
Chol/HDL Ratio: 4 ratio units (ref 0.0–5.0)
Cholesterol, Total: 108 mg/dL (ref 100–199)
HDL: 27 mg/dL — AB (ref >39)
LDL Calculated: 55 mg/dL (ref 0–99)
Triglycerides: 132 mg/dL (ref 0–149)
VLDL CHOLESTEROL CAL: 26 mg/dL (ref 5–40)

## 2014-11-19 ENCOUNTER — Other Ambulatory Visit: Payer: Self-pay | Admitting: Family Medicine

## 2014-12-21 ENCOUNTER — Other Ambulatory Visit: Payer: Self-pay | Admitting: *Deleted

## 2014-12-21 DIAGNOSIS — Z1211 Encounter for screening for malignant neoplasm of colon: Secondary | ICD-10-CM

## 2014-12-25 ENCOUNTER — Encounter: Payer: Self-pay | Admitting: *Deleted

## 2014-12-28 ENCOUNTER — Ambulatory Visit (INDEPENDENT_AMBULATORY_CARE_PROVIDER_SITE_OTHER): Payer: Medicare Other | Admitting: Cardiovascular Disease

## 2014-12-28 ENCOUNTER — Encounter: Payer: Self-pay | Admitting: Cardiovascular Disease

## 2014-12-28 VITALS — BP 138/84 | HR 68 | Ht 71.0 in | Wt 252.0 lb

## 2014-12-28 DIAGNOSIS — E785 Hyperlipidemia, unspecified: Secondary | ICD-10-CM

## 2014-12-28 DIAGNOSIS — I10 Essential (primary) hypertension: Secondary | ICD-10-CM | POA: Diagnosis not present

## 2014-12-28 DIAGNOSIS — I2510 Atherosclerotic heart disease of native coronary artery without angina pectoris: Secondary | ICD-10-CM | POA: Diagnosis not present

## 2014-12-28 MED ORDER — LISINOPRIL 2.5 MG PO TABS: 2.5000 mg | ORAL_TABLET | Freq: Every day | ORAL | Status: DC

## 2014-12-28 MED ORDER — METOPROLOL SUCCINATE ER 50 MG PO TB24
50.0000 mg | ORAL_TABLET | Freq: Every day | ORAL | Status: DC
Start: 2014-12-28 — End: 2015-12-20

## 2014-12-28 MED ORDER — ATORVASTATIN CALCIUM 40 MG PO TABS: 40.0000 mg | ORAL_TABLET | Freq: Every day | ORAL | Status: DC

## 2014-12-28 NOTE — Progress Notes (Signed)
Chief Complaint  Patient presents with  . Follow-up    CAD     History of Present Illness: 69 yo male with history of CAD s/p CABG 2000, HTN, HLD, DM and former tobacco abuse here today for cardiac follow up. He has been followed in the past by Dr. Olevia Perches. In 2000 he had an MI with subsequent bypass surgery (last cath 2005 with occluded LIMA to LAD-native LAD open, patent SVG to diagonal and patent RIMA to RCA). He quit smoking in 1999. He has now fully retired. He was a Emergency planning/management officer for 28 years. Stress myoview 12/01/12 with no ischemia.   He has had no recent chest pain, shortness of breath or palpitations. He feels well. He is very active.   Primary Care Physician: Dr. Laurance Flatten   Past Medical History  Diagnosis Date  . Hypertension   . Hyperlipidemia   . Diabetes mellitus   . Coronary artery disease     3V CABG 2000. Last heart cath 2005, with 2 patent grafts    Past Surgical History  Procedure Laterality Date  . Coronary artery bypass graft  2000    3 vessel  . Hernia repair    . Appendectomy    . Cardiac catheterization  2005    Current Outpatient Prescriptions  Medication Sig Dispense Refill  . aspirin 81 MG tablet Take 81 mg by mouth daily.    Marland Kitchen atorvastatin (LIPITOR) 40 MG tablet Take 1 tablet (40 mg total) by mouth daily. 90 tablet 3  . glipiZIDE (GLUCOTROL XL) 10 MG 24 hr tablet Take 1 tablet (10 mg total) by mouth daily. 30 tablet 11  . glucose blood (ONETOUCH VERIO) test strip Test 1X per day and as needed  Dx 250.02 100 each 12  . Lancets (ONETOUCH ULTRASOFT) lancets Patient test 1X per day and prn  Dx 250.02 100 each 12  . lisinopril (PRINIVIL,ZESTRIL) 2.5 MG tablet Take 1 tablet (2.5 mg total) by mouth daily. 90 tablet 3  . metFORMIN (GLUCOPHAGE) 1000 MG tablet Take 1 tablet (1,000 mg total) by mouth 2 (two) times daily with a meal. 60 tablet 11  . metoprolol succinate (TOPROL-XL) 50 MG 24 hr tablet Take 1 tablet (50 mg total) by mouth daily. 90 tablet 3  .  Omega-3 Fatty Acids (FISH OIL) 1000 MG CAPS Take 1 capsule by mouth daily.     Marland Kitchen omeprazole (PRILOSEC) 40 MG capsule Take 40 mg by mouth daily.     . vitamin C (ASCORBIC ACID) 500 MG tablet Take 500 mg by mouth daily.       No current facility-administered medications for this visit.    Allergies  Allergen Reactions  . Morphine Swelling and Other (See Comments)    PT DOESNT REMEMBER /     Social History   Social History  . Marital Status: Married    Spouse Name: N/A  . Number of Children: N/A  . Years of Education: N/A   Occupational History  . part-time trucker    Social History Main Topics  . Smoking status: Former Smoker    Quit date: 01/13/1997  . Smokeless tobacco: Not on file  . Alcohol Use: No  . Drug Use: No  . Sexual Activity: Not on file   Other Topics Concern  . Not on file   Social History Narrative    Family History  Problem Relation Age of Onset  . Lung cancer Mother   . Dementia Father   . Heart attack  Neg Hx   . Stroke Neg Hx   . Hyperlipidemia Father   . Cancer Mother     Review of Systems:  As stated in the HPI and otherwise negative.   BP 138/84 mmHg  Pulse 68  Ht 5\' 11"  (1.803 m)  Wt 252 lb (114.306 kg)  BMI 35.16 kg/m2  SpO2 97%  Physical Examination: General: Well developed, well nourished, NAD HEENT: OP clear, mucus membranes moist SKIN: warm, dry. No rashes. Neuro: No focal deficits Musculoskeletal: Muscle strength 5/5 all ext Psychiatric: Mood and affect normal Neck: No JVD, no carotid bruits, no thyromegaly, no lymphadenopathy. Lungs:Clear bilaterally, no wheezes, rhonci, crackles Cardiovascular: Regular, brady. No murmurs, gallops or rubs. Abdomen:Soft. Bowel sounds present. Non-tender.  Extremities: No lower extremity edema. Pulses are 2 + in the bilateral DP/PT.  Stress myoview 12/01/12: Stress Procedure: The patient exercised on the treadmill utilizing the Bruce Protocol for 7:00 minutes. The patient stopped due to  fatigue and denied any chest pain. Technetium 21m Sestamibi was injected at peak exercise and myocardial perfusion imaging was performed after a brief delay. Stress ECG: No significant change from baseline ECG  QPS Raw Data Images: Normal; no motion artifact; normal heart/lung ratio. Stress Images: Normal homogeneous uptake in all areas of the myocardium. Rest Images: Normal homogeneous uptake in all areas of the myocardium. Subtraction (SDS): No evidence of ischemia. Transient Ischemic Dilatation (Normal <1.22): 0.94 Lung/Heart Ratio (Normal <0.45): 0.44  Quantitative Gated Spect Images QGS EDV: 137 ml QGS ESV: 70 ml  Impression Exercise Capacity: Good exercise capacity. BP Response: Normal blood pressure response. Clinical Symptoms: No significant symptoms noted. ECG Impression: No significant ST segment change suggestive of ischemia. Comparison with Prior Nuclear Study: No images to compare  Overall Impression: Normal stress nuclear study.  LV Ejection Fraction: 49%. LV Wall Motion: NL LV Function; NL Wall Motion.  EKG:  EKG is ordered today. The ekg ordered today demonstrates NSR, rate 68 bpm. Normal EKG.   Recent Labs: 04/05/2014: Hemoglobin 14.2 11/07/2014: ALT 20; BUN 16; Creatinine, Ser 1.23; Potassium 4.8; Sodium 143   Lipid Panel    Component Value Date/Time   CHOL 108 11/07/2014 0815   CHOL 107 07/30/2012 1158   TRIG 132 11/07/2014 0815   TRIG 96 07/30/2012 1158   HDL 27* 11/07/2014 0815   HDL 24* 07/30/2012 1158   CHOLHDL 4.0 11/07/2014 0815   LDLCALC 55 11/07/2014 0815   LDLCALC 64 07/30/2012 1158     Wt Readings from Last 3 Encounters:  12/28/14 252 lb (114.306 kg)  11/07/14 252 lb (114.306 kg)  07/20/14 252 lb (114.306 kg)     Other studies Reviewed: Additional studies/ records that were reviewed today include: . Review of the above records demonstrates:    Assessment and Plan:   1. CAD: Stable. Continue ASA, statin, beta  blocker.  Stress myoview 12/01/13 with no ischemia.  2. HTN: BP controlled. Continue current meds.   3. Hyperlipidemia:  Lipids well controlled. Continue statin  Current medicines are reviewed at length with the patient today.  The patient does not have concerns regarding medicines.  The following changes have been made:  no change  Labs/ tests ordered today include:   Orders Placed This Encounter  Procedures  . EKG 12-Lead     Disposition:   FU with me in 12  months   Signed, Lauree Chandler, MD 12/28/2014 1:06 PM    [facility] [address], [place], [place]  [address] Phone: [phone]; Fax: [phone]

## 2014-12-28 NOTE — Patient Instructions (Signed)
Medication Instructions:  The current medical regimen is effective;  continue present plan and medications.  Follow-Up: Follow up in 1 year with Dr. Angelena Form.  You will receive a letter in the mail 2 months before you are due.  Please call us when you receive this letter to schedule your follow up appointment.  If you need a refill on your cardiac medications before your next appointment, please call your pharmacy.  Thank you for choosing [facility]!!

## 2015-02-13 ENCOUNTER — Ambulatory Visit (INDEPENDENT_AMBULATORY_CARE_PROVIDER_SITE_OTHER): Payer: Medicare Other | Admitting: Nurse Practitioner

## 2015-02-13 ENCOUNTER — Encounter: Payer: Self-pay | Admitting: Nurse Practitioner

## 2015-02-13 VITALS — BP 157/82 | HR 72 | Temp 97.6°F | Ht 71.0 in | Wt 244.0 lb

## 2015-02-13 DIAGNOSIS — H6121 Impacted cerumen, right ear: Secondary | ICD-10-CM

## 2015-02-13 NOTE — Patient Instructions (Signed)
Cerumen Impaction The structures of the external ear canal secrete a waxy substance known as cerumen. Excess cerumen can build up in the ear canal, causing a condition known as cerumen impaction. Cerumen impaction can cause ear pain and disrupt the function of the ear. The rate of cerumen production differs for each individual. In certain individuals, the configuration of the ear canal may decrease his or her ability to naturally remove cerumen. CAUSES Cerumen impaction is caused by excessive cerumen production or buildup. RISK FACTORS  Frequent use of swabs to clean ears.  Having narrow ear canals.  Having eczema.  Being dehydrated. SIGNS AND SYMPTOMS  Diminished hearing.  Ear drainage.  Ear pain.  Ear itch. TREATMENT Treatment may involve:  Over-the-counter or prescription ear drops to soften the cerumen.  Removal of cerumen by a health care provider. This may be done with:  Irrigation with warm water. This is the most common method of removal.  Ear curettes and other instruments.  Surgery. This may be done in severe cases. HOME CARE INSTRUCTIONS  Take medicines only as directed by your health care provider.  Do not insert objects into the ear with the intent of cleaning the ear. PREVENTION  Do not insert objects into the ear, even with the intent of cleaning the ear. Removing cerumen as a part of normal hygiene is not necessary, and the use of swabs in the ear canal is not recommended.  Drink enough water to keep your urine clear or pale yellow.  Control your eczema if you have it. SEEK MEDICAL CARE IF:  You develop ear pain.  You develop bleeding from the ear.  The cerumen does not clear after you use ear drops as directed.   This information is not intended to replace advice given to you by your health care provider. Make sure you discuss any questions you have with your health care provider.   Document Released: 02/07/2004 Document Revised: 01/20/2014  Document Reviewed: 08/16/2014 Elsevier Interactive Patient Education 2016 [employer]  

## 2015-02-13 NOTE — Progress Notes (Signed)
   Subjective:    Patient ID: Jacob Rios, male    DOB: 10-Oct-1945, 70 y.o.   MRN: [id number]  HPI  Patient said that Jacob Rios he felt like he had ear wax in his ear- he went to [employer] and got some OTC ear clog solution- he used it and now it feels like superglue in his ear. Hearing is muffled.  Review of Systems  Constitutional: Negative.   HENT: Negative.   Respiratory: Negative.   Cardiovascular: Negative.   Genitourinary: Negative.   Neurological: Negative.   Psychiatric/Behavioral: Negative.   All other systems reviewed and are negative.      Objective:   Physical Exam  Constitutional: He appears well-developed and well-nourished.  HENT:  Right cerumen impaction  S/P irrigation- TM clear  Cardiovascular: Normal rate and normal heart sounds.   Pulmonary/Chest: Effort normal.  Neurological: He is alert.  Skin: Skin is warm.  Psychiatric: He has a normal mood and affect. His behavior is normal. Judgment and thought content normal.    BP 157/82 mmHg  Pulse 72  Temp(Src) 97.6 F (36.4 C) (Oral)  Ht 5\' 11"  (1.803 m)  Wt 244 lb (110.678 kg)  BMI 34.05 kg/m2       Assessment & Plan:   1. Cerumen impaction, right    DO NOT stick qtps in your ear Debrox 2x a week  RTO prn  [name], FNP

## 2015-02-19 ENCOUNTER — Other Ambulatory Visit: Payer: Self-pay | Admitting: Nurse Practitioner

## 2015-02-19 ENCOUNTER — Encounter: Payer: Self-pay | Admitting: Nurse Practitioner

## 2015-02-19 ENCOUNTER — Ambulatory Visit (INDEPENDENT_AMBULATORY_CARE_PROVIDER_SITE_OTHER): Payer: Medicare Other | Admitting: Nurse Practitioner

## 2015-02-19 VITALS — BP 148/88 | HR 64 | Temp 97.4°F | Ht 71.0 in | Wt 244.0 lb

## 2015-02-19 DIAGNOSIS — E785 Hyperlipidemia, unspecified: Secondary | ICD-10-CM

## 2015-02-19 DIAGNOSIS — Z87898 Personal history of other specified conditions: Secondary | ICD-10-CM | POA: Diagnosis not present

## 2015-02-19 DIAGNOSIS — E119 Type 2 diabetes mellitus without complications: Secondary | ICD-10-CM

## 2015-02-19 DIAGNOSIS — I25810 Atherosclerosis of coronary artery bypass graft(s) without angina pectoris: Secondary | ICD-10-CM | POA: Diagnosis not present

## 2015-02-19 DIAGNOSIS — K219 Gastro-esophageal reflux disease without esophagitis: Secondary | ICD-10-CM | POA: Insufficient documentation

## 2015-02-19 DIAGNOSIS — Z139 Encounter for screening, unspecified: Secondary | ICD-10-CM

## 2015-02-19 DIAGNOSIS — I10 Essential (primary) hypertension: Secondary | ICD-10-CM

## 2015-02-19 DIAGNOSIS — Z13820 Encounter for screening for osteoporosis: Secondary | ICD-10-CM

## 2015-02-19 DIAGNOSIS — Z1212 Encounter for screening for malignant neoplasm of rectum: Secondary | ICD-10-CM | POA: Diagnosis not present

## 2015-02-19 DIAGNOSIS — Z1159 Encounter for screening for other viral diseases: Secondary | ICD-10-CM

## 2015-02-19 LAB — POCT GLYCOSYLATED HEMOGLOBIN (HGB A1C): HEMOGLOBIN A1C: 7.2

## 2015-02-19 MED ORDER — METFORMIN HCL 1000 MG PO TABS: 1000.0000 mg | ORAL_TABLET | Freq: Two times a day (BID) | ORAL | Status: DC

## 2015-02-19 MED ORDER — GLIPIZIDE ER 10 MG PO TB24: 10.0000 mg | ORAL_TABLET | Freq: Every day | ORAL | Status: DC

## 2015-02-19 MED ORDER — OMEPRAZOLE 40 MG PO CPDR: 40.0000 mg | DELAYED_RELEASE_CAPSULE | Freq: Every day | ORAL | Status: DC

## 2015-02-19 NOTE — Patient Instructions (Signed)

## 2015-02-19 NOTE — Addendum Note (Signed)
Addended by: [name] on: 02/19/2015 08:54 AM   Modules accepted: Orders

## 2015-02-19 NOTE — Progress Notes (Signed)
Subjective:    Patient ID: Jacob Rios, male    DOB: 1945/03/11, 70 y.o.   MRN: [id number]  HPI: Pt here for management of chronic medical problems. No complaints today   Hypertension This is a chronic problem. The current episode started more than 1 year ago. The problem is unchanged. The problem is controlled. Pertinent negatives include no headaches, malaise/fatigue, orthopnea or palpitations. Risk factors for coronary artery disease include diabetes mellitus, dyslipidemia, obesity, post-menopausal state and sedentary lifestyle. Past treatments include ACE inhibitors and beta blockers. The current treatment provides moderate improvement. Compliance problems include diet and exercise.  Hypertensive end-organ damage includes CAD/MI.  Hyperlipidemia This is a chronic problem. The current episode started more than 1 year ago. Recent lipid tests were reviewed and are variable. Exacerbating diseases include obesity. He has no history of diabetes or hypothyroidism. Current antihyperlipidemic treatment includes diet change and statins. The current treatment provides moderate improvement of lipids. Compliance problems include adherence to diet and adherence to exercise.  Risk factors for coronary artery disease include dyslipidemia, diabetes mellitus, hypertension, male sex and obesity.  Diabetes He presents for his follow-up diabetic visit. He has type 2 diabetes mellitus. Pertinent negatives for hypoglycemia include no headaches. There are no diabetic associated symptoms. Symptoms are stable. Risk factors for coronary artery disease include diabetes mellitus, dyslipidemia, hypertension, male sex and obesity. Current diabetic treatment includes oral agent (dual therapy). He is compliant with treatment all of the time. His weight is stable. When asked about meal planning, he reported none. He has not had a previous visit with a dietitian. He rarely participates in exercise. His breakfast blood glucose is  taken between 8-9 am. His breakfast blood glucose range is generally 110-130 mg/dl. His overall blood glucose range is 110-130 mg/dl. An ACE inhibitor/angiotensin II receptor blocker is being taken. He does not see a podiatrist.Eye exam is not current.  GERD Omeprazole 40 mg daily- keeps symptoms under control CAD Sees cardiologist every 6 months.   Review of Systems  Constitutional: Negative.  Negative for malaise/fatigue.  HENT: Negative.   Respiratory: Negative.   Cardiovascular: Negative for palpitations and orthopnea.  Genitourinary: Negative.   Neurological: Negative.  Negative for headaches.  Psychiatric/Behavioral: Negative.        Objective:   Physical Exam  Constitutional: He is oriented to person, place, and time. He appears well-developed and well-nourished.  HENT:  Head: Normocephalic.  Right Ear: External ear normal.  Left Ear: External ear normal.  Nose: Nose normal.  Mouth/Throat: Oropharynx is clear and moist.  Eyes: EOM are normal. Pupils are equal, round, and reactive to light.  Neck: Normal range of motion. Neck supple. No JVD present. No thyromegaly present.  Cardiovascular: Normal rate, regular rhythm, normal heart sounds and intact distal pulses.  Exam reveals no gallop and no friction rub.   No murmur heard. Pulmonary/Chest: Effort normal and breath sounds normal. No respiratory distress. He has no wheezes. He has no rales. He exhibits no tenderness.  Abdominal: Soft. Bowel sounds are normal. He exhibits no mass. There is no tenderness.  Genitourinary: Prostate normal and penis normal.  Musculoskeletal: Normal range of motion. He exhibits no edema.  Lymphadenopathy:    He has no cervical adenopathy.  Neurological: He is alert and oriented to person, place, and time. No cranial nerve deficit.  Skin: Skin is warm and dry.  Psychiatric: He has a normal mood and affect. His behavior is normal. Judgment and thought content normal.    BP  148/88 mmHg  Pulse  64  Temp(Src) 97.4 F (36.3 C) (Oral)  Ht _0  (1.803 m)  Wt 244 lb (110.678 kg)  BMI 34.05 kg/m2   Results for orders placed or performed in visit on 02/19/15  POCT glycosylated hemoglobin (Hb A1C)  Result Value Ref Range   Hemoglobin A1C 7.2            Assessment & Plan:  1. Hyperlipemia Low fat diet - Lipid panel  2. Essential hypertension Do not add salt to diet - CMP14+EGFR  3. Type 2 diabetes mellitus without complication, without long-term current use of insulin (HCC) conyinue carb counting - POCT glycosylated hemoglobin (Hb A1C) - Microalbumin / creatinine urine ratio - glipiZIDE (GLUCOTROL XL) 10 MG 24 hr tablet; Take 1 tablet (10 mg total) by mouth daily.  Dispense: 30 tablet; Refill: 11 - metFORMIN (GLUCOPHAGE) 1000 MG tablet; Take 1 tablet (1,000 mg total) by mouth 2 (two) times daily with a meal.  Dispense: 60 tablet; Refill: 11  4. Coronary atherosclerosis of autologous vein bypass graft without angina  5. Gastroesophageal reflux disease without esophagitis Avoid spicy foods Do not eat 2 hours prior to bedtime - omeprazole (PRILOSEC) 40 MG capsule; Take 1 capsule (40 mg total) by mouth daily.  Dispense: 30 capsule; Refill: 5    Labs pending Health maintenance reviewed Diet and exercise encouraged Continue all meds Follow up  In 3 month   [place], FNP

## 2015-02-19 NOTE — Addendum Note (Signed)
Addended by: [name] on: 02/19/2015 09:09 AM   Modules accepted: Orders

## 2015-02-20 LAB — CMP14+EGFR
ALK PHOS: 76 IU/L (ref 39–117)
ALT: 25 IU/L (ref 0–44)
AST: 18 IU/L (ref 0–40)
Albumin/Globulin Ratio: 1.6 (ref 1.1–2.5)
Albumin: 4.2 g/dL (ref 3.6–4.8)
BILIRUBIN TOTAL: 0.5 mg/dL (ref 0.0–1.2)
BUN/Creatinine Ratio: 10 (ref 10–22)
BUN: 12 mg/dL (ref 8–27)
CHLORIDE: 99 mmol/L (ref 96–106)
CO2: 26 mmol/L (ref 18–29)
CREATININE: 1.26 mg/dL (ref 0.76–1.27)
Calcium: 9.4 mg/dL (ref 8.6–10.2)
GFR calc Af Amer: 67 mL/min/{1.73_m2} (ref >59)
GFR calc non Af Amer: 58 mL/min/{1.73_m2} — ABNORMAL LOW (ref >59)
Globulin, Total: 2.6 g/dL (ref 1.5–4.5)
Glucose: 140 mg/dL — ABNORMAL HIGH (ref 65–99)
Potassium: 4.5 mmol/L (ref 3.5–5.2)
Sodium: 140 mmol/L (ref 134–144)
TOTAL PROTEIN: 6.8 g/dL (ref 6.0–8.5)

## 2015-02-20 LAB — PSA, TOTAL AND FREE
PSA FREE PCT: 20.4 %
PSA FREE: 1.43 ng/mL
Prostate Specific Ag, Serum: 7 ng/mL — ABNORMAL HIGH (ref 0.0–4.0)

## 2015-02-20 LAB — MICROALBUMIN / CREATININE URINE RATIO
Creatinine, Urine: 145.4 mg/dL
MICROALB/CREAT RATIO: 94.5 mg/g creat — ABNORMAL HIGH (ref 0.0–30.0)
Microalbumin, Urine: 137.4 ug/mL

## 2015-02-20 LAB — LIPID PANEL
CHOLESTEROL TOTAL: 107 mg/dL (ref 100–199)
Chol/HDL Ratio: 3.7 ratio units (ref 0.0–5.0)
HDL: 29 mg/dL — AB (ref >39)
LDL CALC: 46 mg/dL (ref 0–99)
TRIGLYCERIDES: 162 mg/dL — AB (ref 0–149)
VLDL CHOLESTEROL CAL: 32 mg/dL (ref 5–40)

## 2015-02-20 LAB — HEPATITIS C ANTIBODY

## 2015-03-09 ENCOUNTER — Ambulatory Visit (INDEPENDENT_AMBULATORY_CARE_PROVIDER_SITE_OTHER): Payer: Medicare Other | Admitting: Urology

## 2015-03-09 DIAGNOSIS — R972 Elevated prostate specific antigen [PSA]: Secondary | ICD-10-CM | POA: Diagnosis not present

## 2015-03-09 DIAGNOSIS — N403 Nodular prostate with lower urinary tract symptoms: Secondary | ICD-10-CM

## 2015-03-09 DIAGNOSIS — R351 Nocturia: Secondary | ICD-10-CM | POA: Diagnosis not present

## 2015-03-09 DIAGNOSIS — C61 Malignant neoplasm of prostate: Secondary | ICD-10-CM

## 2015-03-19 ENCOUNTER — Encounter: Payer: Self-pay | Admitting: Nurse Practitioner

## 2015-03-19 ENCOUNTER — Ambulatory Visit: Payer: Self-pay | Admitting: Nurse Practitioner

## 2015-03-19 VITALS — HR 78 | Temp 96.8°F | Ht 71.0 in | Wt 243.0 lb

## 2015-03-19 DIAGNOSIS — Z024 Encounter for examination for driving license: Secondary | ICD-10-CM

## 2015-03-19 LAB — URINALYSIS
BILIRUBIN UA: NEGATIVE
Glucose, UA: NEGATIVE
Ketones, UA: NEGATIVE
Nitrite, UA: NEGATIVE
Protein, UA: NEGATIVE
RBC, UA: NEGATIVE
Specific Gravity, UA: 1.01 (ref 1.005–1.030)
Urobilinogen, Ur: 0.2 mg/dL (ref 0.2–1.0)
pH, UA: 6 (ref 5.0–7.5)

## 2015-03-19 NOTE — Progress Notes (Signed)
Patient ID: Jacob Rios, male   DOB: Mar 06, 1945, 70 y.o.   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

## 2015-03-26 ENCOUNTER — Telehealth: Payer: Self-pay | Admitting: Nurse Practitioner

## 2015-03-26 NOTE — Telephone Encounter (Signed)
Contacted patient, he states he found what he was looking for and no longer needs to speak with [place].

## 2015-04-12 ENCOUNTER — Other Ambulatory Visit: Payer: Self-pay | Admitting: Family Medicine

## 2015-06-18 LAB — HM DIABETES EYE EXAM

## 2015-06-19 ENCOUNTER — Encounter: Payer: Self-pay | Admitting: Nurse Practitioner

## 2015-06-19 ENCOUNTER — Ambulatory Visit (INDEPENDENT_AMBULATORY_CARE_PROVIDER_SITE_OTHER): Payer: Medicare Other | Admitting: Nurse Practitioner

## 2015-06-19 ENCOUNTER — Ambulatory Visit: Payer: Medicare Other | Admitting: Nurse Practitioner

## 2015-06-19 VITALS — BP 126/77 | HR 79 | Temp 98.2°F | Ht 71.0 in | Wt 246.0 lb

## 2015-06-19 DIAGNOSIS — I10 Essential (primary) hypertension: Secondary | ICD-10-CM

## 2015-06-19 DIAGNOSIS — E119 Type 2 diabetes mellitus without complications: Secondary | ICD-10-CM | POA: Diagnosis not present

## 2015-06-19 DIAGNOSIS — Z6834 Body mass index (BMI) 34.0-34.9, adult: Secondary | ICD-10-CM | POA: Diagnosis not present

## 2015-06-19 DIAGNOSIS — Z1212 Encounter for screening for malignant neoplasm of rectum: Secondary | ICD-10-CM | POA: Diagnosis not present

## 2015-06-19 DIAGNOSIS — E785 Hyperlipidemia, unspecified: Secondary | ICD-10-CM | POA: Diagnosis not present

## 2015-06-19 DIAGNOSIS — K219 Gastro-esophageal reflux disease without esophagitis: Secondary | ICD-10-CM

## 2015-06-19 LAB — BAYER DCA HB A1C WAIVED: HB A1C: 7.5 % — AB (ref <7.0)

## 2015-06-19 MED ORDER — OMEPRAZOLE 40 MG PO CPDR: 40.0000 mg | DELAYED_RELEASE_CAPSULE | Freq: Every day | ORAL | Status: DC

## 2015-06-19 MED ORDER — GLIPIZIDE ER 10 MG PO TB24: 10.0000 mg | ORAL_TABLET | Freq: Every day | ORAL | Status: DC

## 2015-06-19 MED ORDER — METFORMIN HCL 1000 MG PO TABS: 1000.0000 mg | ORAL_TABLET | Freq: Two times a day (BID) | ORAL | Status: DC

## 2015-06-19 NOTE — Progress Notes (Signed)
Subjective:    Patient ID: Jacob Rios, male    DOB: [birth year], 70 y.o.   MRN: [id number]  Patient here today for follow up of chronic medical problems.  Outpatient Encounter Prescriptions as of 06/19/2015  Medication Sig  . aspirin 81 MG tablet Take 81 mg by mouth daily.  Marland Kitchen atorvastatin (LIPITOR) 40 MG tablet Take 1 tablet (40 mg total) by mouth daily.  Marland Kitchen glipiZIDE (GLUCOTROL XL) 10 MG 24 hr tablet Take 1 tablet (10 mg total) by mouth daily.  Marland Kitchen glucose blood (ONETOUCH VERIO) test strip Test 1X per day and as needed  Dx 250.02  . Lancets (ONETOUCH ULTRASOFT) lancets Patient test 1X per day and prn  Dx 250.02  . lisinopril (PRINIVIL,ZESTRIL) 2.5 MG tablet Take 1 tablet (2.5 mg total) by mouth daily.  . metFORMIN (GLUCOPHAGE) 1000 MG tablet Take 1 tablet (1,000 mg total) by mouth 2 (two) times daily with a meal.  . metoprolol succinate (TOPROL-XL) 50 MG 24 hr tablet Take 1 tablet (50 mg total) by mouth daily.  . Omega-3 Fatty Acids (FISH OIL) 1000 MG CAPS Take 1 capsule by mouth daily.   Marland Kitchen omeprazole (PRILOSEC) 40 MG capsule Take 1 capsule (40 mg total) by mouth daily.  . vitamin C (ASCORBIC ACID) 500 MG tablet Take 500 mg by mouth daily.             Hypertension This is a chronic problem. The current episode started more than 1 year ago. The problem is unchanged. The problem is controlled. Pertinent negatives include no headaches, malaise/fatigue, orthopnea or palpitations. Risk factors for coronary artery disease include diabetes mellitus, dyslipidemia, obesity, post-menopausal state and sedentary lifestyle. Past treatments include ACE inhibitors and beta blockers. The current treatment provides moderate improvement. Compliance problems include diet and exercise.  Hypertensive end-organ damage includes CAD/MI.  Hyperlipidemia This is a chronic problem. The current episode started more than 1 year ago. Recent lipid tests were reviewed and are variable. Exacerbating diseases include  obesity. He has no history of diabetes or hypothyroidism. Current antihyperlipidemic treatment includes diet change and statins. The current treatment provides moderate improvement of lipids. Compliance problems include adherence to diet and adherence to exercise.  Risk factors for coronary artery disease include dyslipidemia, diabetes mellitus, hypertension, male sex and obesity.  Diabetes He presents for his follow-up diabetic visit. He has type 2 diabetes mellitus. Pertinent negatives for hypoglycemia include no headaches. There are no diabetic associated symptoms. Symptoms are stable. Risk factors for coronary artery disease include diabetes mellitus, dyslipidemia, hypertension, male sex and obesity. Current diabetic treatment includes oral agent (dual therapy). He is compliant with treatment all of the time. His weight is stable. When asked about meal planning, he reported none. He has not had a previous visit with a dietitian. He rarely participates in exercise. His breakfast blood glucose is taken between 8-9 am. His breakfast blood glucose range is generally 110-130 mg/dl. His overall blood glucose range is 110-130 mg/dl. An ACE inhibitor/angiotensin II receptor blocker is being taken. He does not see a podiatrist.Eye exam is not current.  GERD Omeprazole 40 mg daily- keeps symptoms under control CAD Sees cardiologist every 6 months.   Review of Systems  Constitutional: Negative.  Negative for malaise/fatigue.  HENT: Negative.   Respiratory: Negative.   Cardiovascular: Negative for palpitations and orthopnea.  Genitourinary: Negative.   Neurological: Negative.  Negative for headaches.  Psychiatric/Behavioral: Negative.        Objective:   Physical Exam  Constitutional:  He is oriented to person, place, and time. He appears well-developed and well-nourished.  HENT:  Head: Normocephalic.  Right Ear: External ear normal.  Left Ear: External ear normal.  Nose: Nose normal.    Mouth/Throat: Oropharynx is clear and moist.  Eyes: EOM are normal. Pupils are equal, round, and reactive to light.  Neck: Normal range of motion. Neck supple. No JVD present. No thyromegaly present.  Cardiovascular: Normal rate, regular rhythm, normal heart sounds and intact distal pulses.  Exam reveals no gallop and no friction rub.   No murmur heard. Pulmonary/Chest: Effort normal and breath sounds normal. No respiratory distress. He has no wheezes. He has no rales. He exhibits no tenderness.  Abdominal: Soft. Bowel sounds are normal. He exhibits no mass. There is no tenderness.  Genitourinary: Prostate normal and penis normal.  Musculoskeletal: Normal range of motion. He exhibits no edema.  Lymphadenopathy:    He has no cervical adenopathy.  Neurological: He is alert and oriented to person, place, and time. No cranial nerve deficit.  Skin: Skin is warm and dry.  Psychiatric: He has a normal mood and affect. His behavior is normal. Judgment and thought content normal.    BP 126/77 mmHg  Pulse 79  Temp(Src) 98.2 F (36.8 C) (Oral)  Ht _0  (1.803 m)  Wt 246 lb (111.585 kg)  BMI 34.33 kg/m2  hgba1c- 7.6%- up from 7.2 %     Assessment & Plan:  1. Hyperlipemia Low fat diet - Lipid panel  2. Essential hypertension Do not add salt o diet - CMP14+EGFR  3. Type 2 diabetes mellitus without complication, without long-term current use of insulin (HCC) Stricter carb counting - Bayer DCA Hb A1c Waived  4. Gastroesophageal reflux disease without esophagitis Avoid spicy foods Do not eat 2 hours prior to bedtime - omeprazole (PRILOSEC) 40 MG capsule; Take 1 capsule (40 mg total) by mouth daily.  Dispense: 30 capsule; Refill: 5  5. BMI 34.0-34.9,adult Discussed diet and exercise for person with BMI >25 Will recheck weight in 3-6 months  6. Screening for malignant neoplasm of the rectum - Fecal occult blood, imunochemical; Future    Labs pending Health maintenance  reviewed Diet and exercise encouraged Continue all meds Follow up  In 6 month   [place], FNP

## 2015-06-19 NOTE — Patient Instructions (Signed)

## 2015-06-19 NOTE — Addendum Note (Signed)
Addended by: [name] on: 06/19/2015 12:42 PM   Modules accepted: Orders

## 2015-06-20 LAB — CMP14+EGFR
ALT: 18 IU/L (ref 0–44)
AST: 16 IU/L (ref 0–40)
Albumin/Globulin Ratio: 1.6 (ref 1.2–2.2)
Albumin: 4.4 g/dL (ref 3.6–4.8)
Alkaline Phosphatase: 84 IU/L (ref 39–117)
BILIRUBIN TOTAL: 0.4 mg/dL (ref 0.0–1.2)
BUN/Creatinine Ratio: 12 (ref 10–24)
BUN: 17 mg/dL (ref 8–27)
CALCIUM: 9.7 mg/dL (ref 8.6–10.2)
CHLORIDE: 98 mmol/L (ref 96–106)
CO2: 24 mmol/L (ref 18–29)
Creatinine, Ser: 1.44 mg/dL — ABNORMAL HIGH (ref 0.76–1.27)
GFR calc non Af Amer: 50 mL/min/{1.73_m2} — ABNORMAL LOW (ref >59)
GFR, EST AFRICAN AMERICAN: 57 mL/min/{1.73_m2} — AB (ref >59)
GLOBULIN, TOTAL: 2.8 g/dL (ref 1.5–4.5)
Glucose: 116 mg/dL — ABNORMAL HIGH (ref 65–99)
POTASSIUM: 4.7 mmol/L (ref 3.5–5.2)
Sodium: 138 mmol/L (ref 134–144)
TOTAL PROTEIN: 7.2 g/dL (ref 6.0–8.5)

## 2015-06-20 LAB — LIPID PANEL
CHOL/HDL RATIO: 3.9 ratio (ref 0.0–5.0)
Cholesterol, Total: 116 mg/dL (ref 100–199)
HDL: 30 mg/dL — AB (ref >39)
LDL Calculated: 52 mg/dL (ref 0–99)
Triglycerides: 170 mg/dL — ABNORMAL HIGH (ref 0–149)
VLDL Cholesterol Cal: 34 mg/dL (ref 5–40)

## 2015-06-21 ENCOUNTER — Telehealth: Payer: Self-pay | Admitting: Nurse Practitioner

## 2015-06-21 NOTE — Telephone Encounter (Signed)
Spoke with patient and he is aware that PSA was not drawn at this visit. Patient aware of all other labs.

## 2015-07-02 ENCOUNTER — Other Ambulatory Visit: Payer: Medicare Other

## 2015-07-02 DIAGNOSIS — Z1212 Encounter for screening for malignant neoplasm of rectum: Secondary | ICD-10-CM

## 2015-07-04 LAB — FECAL OCCULT BLOOD, IMMUNOCHEMICAL: Fecal Occult Bld: NEGATIVE

## 2015-07-23 ENCOUNTER — Telehealth: Payer: Self-pay | Admitting: Nurse Practitioner

## 2015-07-23 ENCOUNTER — Other Ambulatory Visit: Payer: Self-pay | Admitting: Nurse Practitioner

## 2015-07-23 MED ORDER — SULFAMETHOXAZOLE-TRIMETHOPRIM 800-160 MG PO TABS: 1.0000 | ORAL_TABLET | Freq: Two times a day (BID) | ORAL | Status: DC

## 2015-09-07 ENCOUNTER — Ambulatory Visit (INDEPENDENT_AMBULATORY_CARE_PROVIDER_SITE_OTHER): Payer: Medicare Other | Admitting: Urology

## 2015-09-07 DIAGNOSIS — R351 Nocturia: Secondary | ICD-10-CM

## 2015-09-07 DIAGNOSIS — N403 Nodular prostate with lower urinary tract symptoms: Secondary | ICD-10-CM

## 2015-09-07 DIAGNOSIS — C61 Malignant neoplasm of prostate: Secondary | ICD-10-CM

## 2015-10-16 ENCOUNTER — Ambulatory Visit (INDEPENDENT_AMBULATORY_CARE_PROVIDER_SITE_OTHER): Payer: Medicare Other | Admitting: Family Medicine

## 2015-10-16 ENCOUNTER — Ambulatory Visit (INDEPENDENT_AMBULATORY_CARE_PROVIDER_SITE_OTHER): Payer: Medicare Other

## 2015-10-16 ENCOUNTER — Encounter: Payer: Self-pay | Admitting: Family Medicine

## 2015-10-16 VITALS — BP 139/74 | HR 74 | Temp 97.9°F | Ht 71.0 in | Wt 251.4 lb

## 2015-10-16 DIAGNOSIS — R079 Chest pain, unspecified: Secondary | ICD-10-CM

## 2015-10-16 DIAGNOSIS — Z23 Encounter for immunization: Secondary | ICD-10-CM

## 2015-10-16 MED ORDER — HYDROCODONE-ACETAMINOPHEN 5-325 MG PO TABS: 1.0000 | ORAL_TABLET | Freq: Four times a day (QID) | ORAL | 0 refills | Status: DC | PRN

## 2015-10-16 MED ORDER — OXYCODONE HCL 5 MG PO TABS: 5.0000 mg | ORAL_TABLET | Freq: Four times a day (QID) | ORAL | 0 refills | Status: DC | PRN

## 2015-10-16 NOTE — Patient Instructions (Signed)
Great to meet you!  It looks like you have some rib fractures to me, the radiologist will weigh in later this evening.    Use ice 10-15 minutes 4-5 times daily, take 2 tylenol 3 times daily as needed, use hydrocodone for pain as needed ( do not drive afterwards)   Rib Fracture A rib fracture is a break or crack in one of the bones of the ribs. The ribs are a group of long, curved bones that wrap around your chest and attach to your spine. They protect your lungs and other organs in the chest cavity. A broken or cracked rib is often painful, but most do not cause other problems. Most rib fractures heal on their own over time. However, rib fractures can be more serious if multiple ribs are broken or if broken ribs move out of place and push against other structures. CAUSES   A direct blow to the chest. For example, this could happen during contact sports, a car accident, or a fall against a hard object.  Repetitive movements with high force, such as pitching a baseball or having severe coughing spells. SYMPTOMS   Pain when you breathe in or cough.  Pain when someone presses on the injured area. DIAGNOSIS  Your caregiver will perform a physical exam. Various imaging tests may be ordered to confirm the diagnosis and to look for related injuries. These tests may include a chest X-ray, computed tomography (CT), magnetic resonance imaging (MRI), or a bone scan. TREATMENT  Rib fractures usually heal on their own in 1-3 months. The longer healing period is often associated with a continued cough or other aggravating activities. During the healing period, pain control is very important. Medication is usually given to control pain. Hospitalization or surgery may be needed for more severe injuries, such as those in which multiple ribs are broken or the ribs have moved out of place.  HOME CARE INSTRUCTIONS   Avoid strenuous activity and any activities or movements that cause pain. Be careful during  activities and avoid bumping the injured rib.  Gradually increase activity as directed by your caregiver.  Only take over-the-counter or prescription medications as directed by your caregiver. Do not take other medications without asking your caregiver first.  Apply ice to the injured area for the first 1-2 days after you have been treated or as directed by your caregiver. Applying ice helps to reduce inflammation and pain.  Put ice in a plastic bag.  Place a towel between your skin and the bag.   Leave the ice on for 15-20 minutes at a time, every 2 hours while you are awake.  Perform deep breathing as directed by your caregiver. This will help prevent pneumonia, which is a common complication of a broken rib. Your caregiver may instruct you to:  Take deep breaths several times a day.  Try to cough several times a day, holding a pillow against the injured area.  Use a device called an incentive spirometer to practice deep breathing several times a day.  Drink enough fluids to keep your urine clear or pale yellow. This will help you avoid constipation.   Do not wear a rib belt or binder. These restrict breathing, which can lead to pneumonia.  SEEK IMMEDIATE MEDICAL CARE IF:   You have a fever.   You have difficulty breathing or shortness of breath.   You develop a continual cough, or you cough up thick or bloody sputum.  You feel sick to your stomach (  nausea), throw up (vomit), or have abdominal pain.   You have worsening pain not controlled with medications.  MAKE SURE YOU:  Understand these instructions.  Will watch your condition.  Will get help right away if you are not doing well or get worse.   This information is not intended to replace advice given to you by your health care provider. Make sure you discuss any questions you have with your health care provider.   Document Released: 12/30/2004 Document Revised: 09/01/2012 Document Reviewed:  03/03/2012 Elsevier Interactive Patient Education [employer].

## 2015-10-16 NOTE — Progress Notes (Signed)
   HPI  Patient presents today here with rib pain.  He explains that one day ago he was going up hill with his riding lawnmower when it tipped backwards landing on his chest area because severe pain in his right sided chest that he did not lose consciousness.  He states that starting earlier today he had severe right-sided chest pain with bending over to pick up things. He has mild to moderate pain above his left eye, he also has mild pain on his right hip.  He's walking normally he denies any headaches. He did not have any cuts or bleeding.  He did not lose consciousness.  PMH: Smoking status noted ROS: Per HPI  Objective: BP 139/74   Pulse 74   Temp 97.9 F (36.6 C) (Oral)   Ht 5\' 11"  (1.803 m)   Wt 251 lb 6.4 oz (114 kg)   BMI 35.06 kg/m  Gen: NAD, alert, cooperative with exam HEENT: NCAT CV: RRR, good S1/S2, no murmur Chest wall: Severe tenderness to palpation on the right sided parasternal area around the fifth and sixth rib. Resp: CTABL, no wheezes, non-labored Ext: No edema, warm Neuro: Alert and oriented, No gross deficits  Plain film today shows rib fractures of the fifth and sixth rib on the right side  Assessment and plan:  # Musculoskeletal chest pain, rib injury Awaiting official read bu tthere appears to be 2 involved ribs on my read Discussed Ice, no NSAIDs with CAD Oxycodone has been tolerated previously, given Rx for 30 pills and discussed usual course of illness Discussed risk of developing CAP- low threshold for eval of cough RTC with any concerns   Orders Placed This Encounter  Procedures  . DG Ribs Unilateral W/Chest Right    Standing Status:   Future    Number of Occurrences:   1    Standing Expiration Date:   12/15/2016    Order Specific Question:   Reason for Exam (SYMPTOM  OR DIAGNOSIS REQUIRED)    Answer:   chest pain    Order Specific Question:   Preferred imaging location?    Answer:   Internal    Meds ordered this encounter    Medications  . DISCONTD: HYDROcodone-acetaminophen (NORCO) 5-325 MG tablet    Sig: Take 1 tablet by mouth every 6 (six) hours as needed for moderate pain.    Dispense:  30 tablet    Refill:  0  . oxyCODONE (OXY IR/ROXICODONE) 5 MG immediate release tablet    Sig: Take 1 tablet (5 mg total) by mouth every 6 (six) hours as needed for severe pain.    Dispense:  30 tablet    Refill:  0    Laroy Apple, MD [facility] 10/16/2015, 4:31 PM

## 2015-10-23 ENCOUNTER — Ambulatory Visit (HOSPITAL_COMMUNITY)
Admit: 2015-10-23 | Discharge: 2015-10-23 | Disposition: A | Discharge status: Home / Self Care | Payer: Medicare Other | Source: Ambulatory Visit | Attending: Emergency Medicine | Admitting: Emergency Medicine

## 2015-10-23 ENCOUNTER — Emergency Department (HOSPITAL_COMMUNITY)
Admission: EM | Admit: 2015-10-23 | Discharge: 2015-10-23 | Disposition: A | Discharge status: Home / Self Care | Payer: Medicare Other | Attending: Emergency Medicine | Admitting: Emergency Medicine

## 2015-10-23 ENCOUNTER — Encounter (HOSPITAL_COMMUNITY): Payer: Self-pay | Admitting: *Deleted

## 2015-10-23 DIAGNOSIS — Z87891 Personal history of nicotine dependence: Secondary | ICD-10-CM | POA: Diagnosis not present

## 2015-10-23 DIAGNOSIS — Z7982 Long term (current) use of aspirin: Secondary | ICD-10-CM | POA: Diagnosis not present

## 2015-10-23 DIAGNOSIS — I10 Essential (primary) hypertension: Secondary | ICD-10-CM | POA: Insufficient documentation

## 2015-10-23 DIAGNOSIS — I2510 Atherosclerotic heart disease of native coronary artery without angina pectoris: Secondary | ICD-10-CM | POA: Insufficient documentation

## 2015-10-23 DIAGNOSIS — E119 Type 2 diabetes mellitus without complications: Secondary | ICD-10-CM | POA: Diagnosis not present

## 2015-10-23 DIAGNOSIS — M79604 Pain in right leg: Secondary | ICD-10-CM

## 2015-10-23 DIAGNOSIS — Y929 Unspecified place or not applicable: Secondary | ICD-10-CM | POA: Diagnosis not present

## 2015-10-23 DIAGNOSIS — M7989 Other specified soft tissue disorders: Secondary | ICD-10-CM | POA: Insufficient documentation

## 2015-10-23 DIAGNOSIS — Z7984 Long term (current) use of oral hypoglycemic drugs: Secondary | ICD-10-CM | POA: Diagnosis not present

## 2015-10-23 DIAGNOSIS — S8991XA Unspecified injury of right lower leg, initial encounter: Secondary | ICD-10-CM | POA: Diagnosis present

## 2015-10-23 DIAGNOSIS — Z79899 Other long term (current) drug therapy: Secondary | ICD-10-CM | POA: Diagnosis not present

## 2015-10-23 DIAGNOSIS — S8011XA Contusion of right lower leg, initial encounter: Secondary | ICD-10-CM | POA: Diagnosis not present

## 2015-10-23 DIAGNOSIS — Y999 Unspecified external cause status: Secondary | ICD-10-CM | POA: Diagnosis not present

## 2015-10-23 DIAGNOSIS — Y939 Activity, unspecified: Secondary | ICD-10-CM | POA: Insufficient documentation

## 2015-10-23 DIAGNOSIS — W28XXXA Contact with powered lawn mower, initial encounter: Secondary | ICD-10-CM | POA: Insufficient documentation

## 2015-10-23 LAB — CBC WITH DIFFERENTIAL/PLATELET
BASOS ABS: 0 10*3/uL (ref 0.0–0.1)
Basophils Relative: 1 %
EOS PCT: 8 %
Eosinophils Absolute: 0.4 10*3/uL (ref 0.0–0.7)
HCT: 38.6 % — ABNORMAL LOW (ref 39.0–52.0)
Hemoglobin: 13.1 g/dL (ref 13.0–17.0)
LYMPHS PCT: 25 %
Lymphs Abs: 1.2 10*3/uL (ref 0.7–4.0)
MCH: 29.2 pg (ref 26.0–34.0)
MCHC: 33.9 g/dL (ref 30.0–36.0)
MCV: 86.2 fL (ref 78.0–100.0)
MONO ABS: 0.4 10*3/uL (ref 0.1–1.0)
MONOS PCT: 9 %
Neutro Abs: 2.9 10*3/uL (ref 1.7–7.7)
Neutrophils Relative %: 57 %
PLATELETS: 143 10*3/uL — AB (ref 150–400)
RBC: 4.48 MIL/uL (ref 4.22–5.81)
RDW: 13.5 % (ref 11.5–15.5)
WBC: 5.1 10*3/uL (ref 4.0–10.5)

## 2015-10-23 MED ORDER — HYDROCODONE-ACETAMINOPHEN 5-325 MG PO TABS
1.0000 | ORAL_TABLET | Freq: Once | ORAL | Status: AC
  Administered 2015-10-23: 1 via ORAL
  Filled 2015-10-23: qty 1

## 2015-10-23 MED ORDER — HYDROCODONE-ACETAMINOPHEN 5-325 MG PO TABS: 1.0000 | ORAL_TABLET | Freq: Four times a day (QID) | ORAL | 0 refills | Status: DC | PRN

## 2015-10-23 NOTE — ED Notes (Signed)
Prepack of 6 Oxycodone given to patient with Otila Kluver, RN as witness. Pt aware of his ultrasound appt this morning at 11

## 2015-10-23 NOTE — ED Provider Notes (Signed)
Ultrasound of right lower extremity negative for DVT. Discussed with the patient.   Nat Christen, MD 10/23/15 [phone]

## 2015-10-23 NOTE — Discharge Instructions (Signed)
Return today for ultrasound. Continue compression, ice, and pain medication.

## 2015-10-23 NOTE — ED Provider Notes (Signed)
[facility] DEPT Provider Note   CSN: [id number] Arrival date & time: 10/23/15  0137     History   Chief Complaint Chief Complaint  Patient presents with  . Leg Pain    HPI Jacob Rios is a 70 y.o. male.  HPI  This is a 69 year old male with history of coronary artery disease, diabetes, hyperlipidemia who presents with right leg pain. Patient reports that he flipped his lawnmower one week ago. He was seen in an outside hospital and diagnosed with rib fractures. He also noted a contusion to his right leg. He states that since that time he has had pain that comes and goes in that leg. However tonight the pain got worse and he noted worsening hematoma. He is not on any anticoagulants but is on aspirin. Denies any new injury. Has been ambulatory. Denies weakness, numbness, tingling of the lower extremities.  Past Medical History:  Diagnosis Date  . Coronary artery disease    3V CABG 2000. Last heart cath 2005, with 2 patent grafts  . Diabetes mellitus   . Hyperlipidemia   . Hypertension     Patient Active Problem List   Diagnosis Date Noted  . Gastroesophageal reflux disease without esophagitis 02/19/2015  . Hyperlipemia 07/20/2014  . BMI 35.0-35.9,adult 07/20/2014  . Diabetes mellitus ([place]) 10/21/2010  . Essential hypertension 10/26/2008  . CAD, AUTOLOGOUS BYPASS GRAFT 10/26/2008    Past Surgical History:  Procedure Laterality Date  . APPENDECTOMY    . CARDIAC CATHETERIZATION  2005  . CORONARY ARTERY BYPASS GRAFT  2000   3 vessel  . HERNIA REPAIR         Home Medications    Prior to Admission medications   Medication Sig Start Date End Date Taking? Authorizing Provider  aspirin 81 MG tablet Take 81 mg by mouth daily.    Historical Provider, MD  atorvastatin (LIPITOR) 40 MG tablet Take 1 tablet (40 mg total) by mouth daily. 12/28/14   Burnell Blanks, MD  glipiZIDE (GLUCOTROL XL) 10 MG 24 hr tablet Take 1 tablet (10 mg total) by mouth daily. 06/19/15    [name], FNP  glucose blood (ONETOUCH VERIO) test strip Test 1X per day and as needed  Dx 250.02 07/01/13   [name], FNP  HYDROcodone-acetaminophen (NORCO/VICODIN) 5-325 MG tablet Take 1 tablet by mouth every 6 (six) hours as needed. 10/23/15   Merryl Hacker, MD  Lancets Glory Rosebush ULTRASOFT) lancets Patient test 1X per day and prn  Dx 250.02 09/01/12   [name], FNP  lisinopril (PRINIVIL,ZESTRIL) 2.5 MG tablet Take 1 tablet (2.5 mg total) by mouth daily. 12/28/14   Burnell Blanks, MD  metFORMIN (GLUCOPHAGE) 1000 MG tablet Take 1 tablet (1,000 mg total) by mouth 2 (two) times daily with a meal. 06/19/15   [name], FNP  metoprolol succinate (TOPROL-XL) 50 MG 24 hr tablet Take 1 tablet (50 mg total) by mouth daily. 12/28/14   Burnell Blanks, MD  Omega-3 Fatty Acids (FISH OIL) 1000 MG CAPS Take 1 capsule by mouth daily.     Historical Provider, MD  omeprazole (PRILOSEC) 40 MG capsule Take 1 capsule (40 mg total) by mouth daily. 06/19/15   [name], FNP  oxyCODONE (OXY IR/ROXICODONE) 5 MG immediate release tablet Take 1 tablet (5 mg total) by mouth every 6 (six) hours as needed for severe pain. 10/16/15   Timmothy Euler, MD  vitamin C (ASCORBIC ACID) 500 MG tablet Take 500 mg by mouth daily.  Historical Provider, MD    Family History Family History  Problem Relation Age of Onset  . Lung cancer Mother   . Cancer Mother   . Dementia Father   . Hyperlipidemia Father   . Heart attack Neg Hx   . Stroke Neg Hx     Social History Social History  Substance Use Topics  . Smoking status: Former Smoker    Quit date: 01/13/1997  . Smokeless tobacco: Never Used  . Alcohol use No     Allergies   Morphine   Review of Systems Review of Systems  Respiratory: Negative for shortness of breath.   Cardiovascular: Negative for chest pain.  Skin: Positive for color change and wound.  Neurological: Negative for weakness  and numbness.  All other systems reviewed and are negative.    Physical Exam Updated Vital Signs BP 144/77 (BP Location: Right Arm)   Pulse 81   Temp 97.7 F (36.5 C) (Oral)   Resp 20   Ht 5\' 11"  (1.803 m)   Wt 245 lb (111.1 kg)   SpO2 96%   BMI 34.17 kg/m   Physical Exam  Constitutional: He is oriented to person, place, and time. He appears well-developed and well-nourished. No distress.  HENT:  Head: Normocephalic and atraumatic.  Cardiovascular: Normal rate and regular rhythm.   Pulmonary/Chest: Effort normal. No respiratory distress.  Musculoskeletal: He exhibits no edema.  Normal range of motion of the right hip and right knee contusion noted involving the posterior and medial aspect of the right thigh with tenderness to palpation, 2+ DP pulse  Neurological: He is alert and oriented to person, place, and time.  Skin: Skin is warm and dry.  Psychiatric: He has a normal mood and affect.  Nursing note and vitals reviewed.    ED Treatments / Results  Labs (all labs ordered are listed, but only abnormal results are displayed) Labs Reviewed  CBC WITH DIFFERENTIAL/PLATELET - Abnormal; Notable for the following:       Result Value   HCT 38.6 (*)    Platelets 143 (*)    All other components within normal limits    EKG  EKG Interpretation None       Radiology No results found.  Procedures Procedures (including critical care time)  Medications Ordered in ED Medications  HYDROcodone-acetaminophen (NORCO/VICODIN) 5-325 MG per tablet 1 tablet (1 tablet Oral Given 10/23/15 0233)     Initial Impression / Assessment and Plan / ED Course  I have reviewed the triage vital signs and the nursing notes.  Pertinent labs & imaging results that were available during my care of the patient were reviewed by me and considered in my medical decision making (see chart for details).  Clinical Course    Patient presents with worsening hematoma of the right leg. Remote  injury but acute worsening tonight. He has evidence of hematoma to the right leg. No new obvious deformities. Only on aspirin. Platelet count is reassuring. Have advised compression, ice, and pain medication. Will also have patient return later today to evaluate for DVT given acute worsening of pain and remote injury that predisposes him to DVT.  After history, exam, and medical workup I feel the patient has been appropriately medically screened and is safe for discharge home. Pertinent diagnoses were discussed with the patient. Patient was given return precautions.   Final Clinical Impressions(s) / ED Diagnoses   Final diagnoses:  Leg hematoma, right, initial encounter    New Prescriptions Discharge Medication List as  of 10/23/2015  3:52 AM    START taking these medications   Details  HYDROcodone-acetaminophen (NORCO/VICODIN) 5-325 MG tablet Take 1 tablet by mouth every 6 (six) hours as needed., Starting Tue 10/23/2015, Print         Merryl Hacker, MD 10/23/15 [phone]

## 2015-10-23 NOTE — ED Triage Notes (Addendum)
Pt states he had a lawnmower accident x 1 week ago and was diagnosed with 2 broken ribs; pt states the back of his right leg is red and very painful; pt has large bruising to back of leg

## 2015-10-30 MED FILL — Hydrocodone-Acetaminophen Tab 5-325 MG: ORAL | Qty: 6 | Status: AC

## 2015-12-20 ENCOUNTER — Encounter: Payer: Self-pay | Admitting: Nurse Practitioner

## 2015-12-20 ENCOUNTER — Ambulatory Visit (INDEPENDENT_AMBULATORY_CARE_PROVIDER_SITE_OTHER): Payer: Medicare Other | Admitting: Nurse Practitioner

## 2015-12-20 VITALS — BP 148/81 | HR 61 | Temp 96.7°F

## 2015-12-20 DIAGNOSIS — K219 Gastro-esophageal reflux disease without esophagitis: Secondary | ICD-10-CM

## 2015-12-20 DIAGNOSIS — I2510 Atherosclerotic heart disease of native coronary artery without angina pectoris: Secondary | ICD-10-CM

## 2015-12-20 DIAGNOSIS — E119 Type 2 diabetes mellitus without complications: Secondary | ICD-10-CM | POA: Diagnosis not present

## 2015-12-20 DIAGNOSIS — E785 Hyperlipidemia, unspecified: Secondary | ICD-10-CM | POA: Diagnosis not present

## 2015-12-20 DIAGNOSIS — E782 Mixed hyperlipidemia: Secondary | ICD-10-CM

## 2015-12-20 DIAGNOSIS — I10 Essential (primary) hypertension: Secondary | ICD-10-CM | POA: Diagnosis not present

## 2015-12-20 DIAGNOSIS — I25810 Atherosclerosis of coronary artery bypass graft(s) without angina pectoris: Secondary | ICD-10-CM | POA: Diagnosis not present

## 2015-12-20 DIAGNOSIS — Z6835 Body mass index (BMI) 35.0-35.9, adult: Secondary | ICD-10-CM

## 2015-12-20 LAB — BAYER DCA HB A1C WAIVED: HB A1C: 7.5 % — AB (ref <7.0)

## 2015-12-20 MED ORDER — GLIPIZIDE ER 10 MG PO TB24: 10.0000 mg | ORAL_TABLET | Freq: Every day | ORAL | 1 refills | Status: DC

## 2015-12-20 MED ORDER — LISINOPRIL 2.5 MG PO TABS: 2.5000 mg | ORAL_TABLET | Freq: Every day | ORAL | 1 refills | Status: DC

## 2015-12-20 MED ORDER — OMEPRAZOLE 40 MG PO CPDR: 40.0000 mg | DELAYED_RELEASE_CAPSULE | Freq: Every day | ORAL | 1 refills | Status: DC

## 2015-12-20 MED ORDER — ATORVASTATIN CALCIUM 40 MG PO TABS: 40.0000 mg | ORAL_TABLET | Freq: Every day | ORAL | 1 refills | Status: DC

## 2015-12-20 MED ORDER — METFORMIN HCL 1000 MG PO TABS: 1000.0000 mg | ORAL_TABLET | Freq: Two times a day (BID) | ORAL | 1 refills | Status: DC

## 2015-12-20 MED ORDER — METOPROLOL SUCCINATE ER 50 MG PO TB24: 50.0000 mg | ORAL_TABLET | Freq: Every day | ORAL | 1 refills | Status: DC

## 2015-12-20 NOTE — Patient Instructions (Signed)

## 2015-12-20 NOTE — Progress Notes (Signed)
Subjective:    Patient ID: Jacob Rios, male    DOB: 1945/01/20, 70 y.o.   MRN: [id number]  Patient here today for follow up of chronic medical problems. No chnages since last visit- no complaints  Outpatient Encounter Prescriptions as of 06/19/2015  Medication Sig  . aspirin 81 MG tablet Take 81 mg by mouth daily.  Marland Kitchen atorvastatin (LIPITOR) 40 MG tablet Take 1 tablet (40 mg total) by mouth daily.  Marland Kitchen glipiZIDE (GLUCOTROL XL) 10 MG 24 hr tablet Take 1 tablet (10 mg total) by mouth daily.  Marland Kitchen glucose blood (ONETOUCH VERIO) test strip Test 1X per day and as needed  Dx 250.02  . Lancets (ONETOUCH ULTRASOFT) lancets Patient test 1X per day and prn  Dx 250.02  . lisinopril (PRINIVIL,ZESTRIL) 2.5 MG tablet Take 1 tablet (2.5 mg total) by mouth daily.  . metFORMIN (GLUCOPHAGE) 1000 MG tablet Take 1 tablet (1,000 mg total) by mouth 2 (two) times daily with a meal.  . metoprolol succinate (TOPROL-XL) 50 MG 24 hr tablet Take 1 tablet (50 mg total) by mouth daily.  . Omega-3 Fatty Acids (FISH OIL) 1000 MG CAPS Take 1 capsule by mouth daily.   Marland Kitchen omeprazole (PRILOSEC) 40 MG capsule Take 1 capsule (40 mg total) by mouth daily.  . vitamin C (ASCORBIC ACID) 500 MG tablet Take 500 mg by mouth daily.          Hypertension  This is a chronic problem. The current episode started more than 1 year ago. The problem is unchanged. The problem is controlled. Pertinent negatives include no headaches, malaise/fatigue, orthopnea or palpitations. Risk factors for coronary artery disease include diabetes mellitus, dyslipidemia, obesity, post-menopausal state and sedentary lifestyle. Past treatments include ACE inhibitors and beta blockers. The current treatment provides moderate improvement. Compliance problems include diet and exercise.  Hypertensive end-organ damage includes CAD/MI.  Hyperlipidemia  This is a chronic problem. The current episode started more than 1 year ago. Recent lipid tests were reviewed and are  variable. Exacerbating diseases include obesity. He has no history of diabetes or hypothyroidism. Current antihyperlipidemic treatment includes diet change and statins. The current treatment provides moderate improvement of lipids. Compliance problems include adherence to diet and adherence to exercise.  Risk factors for coronary artery disease include dyslipidemia, diabetes mellitus, hypertension, male sex and obesity.  Diabetes  He presents for his follow-up diabetic visit. He has type 2 diabetes mellitus. Disease course: last hgba1c was up from 7.2% to 7.6% Pertinent negatives for hypoglycemia include no headaches. There are no diabetic associated symptoms. Symptoms are stable. Risk factors for coronary artery disease include diabetes mellitus, dyslipidemia, hypertension, male sex and obesity. Current diabetic treatment includes oral agent (dual therapy). He is compliant with treatment all of the time. His weight is stable. When asked about meal planning, he reported none. He has not had a previous visit with a dietitian. He rarely participates in exercise. His breakfast blood glucose is taken between 8-9 am. His breakfast blood glucose range is generally 110-130 mg/dl. His overall blood glucose range is 110-130 mg/dl. An ACE inhibitor/angiotensin II receptor blocker is being taken. He does not see a podiatrist.Eye exam is not current.  GERD Omeprazole 40 mg daily- keeps symptoms under control CAD Sees cardiologist every 6 months.   Review of Systems  Constitutional: Negative.  Negative for malaise/fatigue.  HENT: Negative.   Respiratory: Negative.   Cardiovascular: Negative for palpitations and orthopnea.  Genitourinary: Negative.   Neurological: Negative.  Negative for headaches.  Psychiatric/Behavioral: Negative.        Objective:   Physical Exam  Constitutional: He is oriented to person, place, and time. He appears well-developed and well-nourished.  HENT:  Head: Normocephalic.  Right  Ear: External ear normal.  Left Ear: External ear normal.  Nose: Nose normal.  Mouth/Throat: Oropharynx is clear and moist.  Eyes: EOM are normal. Pupils are equal, round, and reactive to light.  Neck: Normal range of motion. Neck supple. No JVD present. No thyromegaly present.  Cardiovascular: Normal rate, regular rhythm, normal heart sounds and intact distal pulses.  Exam reveals no gallop and no friction rub.   No murmur heard. Pulmonary/Chest: Effort normal and breath sounds normal. No respiratory distress. He has no wheezes. He has no rales. He exhibits no tenderness.  Abdominal: Soft. Bowel sounds are normal. He exhibits no mass. There is no tenderness.  Genitourinary: Prostate normal and penis normal.  Musculoskeletal: Normal range of motion. He exhibits no edema.  Lymphadenopathy:    He has no cervical adenopathy.  Neurological: He is alert and oriented to person, place, and time. No cranial nerve deficit.  Skin: Skin is warm and dry.  Psychiatric: He has a normal mood and affect. His behavior is normal. Judgment and thought content normal.   BP (!) 148/81   Pulse 61   Temp (!) 96.7 F (35.9 C) (Oral)   hgba1c- 7.4%- down from 7.6 %     Assessment & Plan:  1. Hyperlipidemia, unspecified hyperlipidemia type Low fat diet - Lipid panel  2. Essential hypertension Low sodium diet - CMP14+EGFR - lisinopril (PRINIVIL,ZESTRIL) 2.5 MG tablet; Take 1 tablet (2.5 mg total) by mouth daily.  Dispense: 90 tablet; Refill: 1  3. Type 2 diabetes mellitus without complication, without long-term current use of insulin (HCC) Continue to wash carbs in diet - Bayer DCA Hb A1c Waived - glipiZIDE (GLUCOTROL XL) 10 MG 24 hr tablet; Take 1 tablet (10 mg total) by mouth daily.  Dispense: 90 tablet; Refill: 1 - metFORMIN (GLUCOPHAGE) 1000 MG tablet; Take 1 tablet (1,000 mg total) by mouth 2 (two) times daily with a meal.  Dispense: 180 tablet; Refill: 1  4. Coronary atherosclerosis of  autologous vein bypass graft without angina  5. Gastroesophageal reflux disease without esophagitis Avoid spicy foods Do not eat 2 hours prior to bedtime - omeprazole (PRILOSEC) 40 MG capsule; Take 1 capsule (40 mg total) by mouth daily.  Dispense: 90 capsule; Refill: 1  6. BMI 35.0-35.9,adult Discussed diet and exercise for person with BMI >25 Will recheck weight in 3-6 months  7. Atherosclerosis of native coronary artery of native heart without angina pectoris - metoprolol succinate (TOPROL-XL) 50 MG 24 hr tablet; Take 1 tablet (50 mg total) by mouth daily.  Dispense: 90 tablet; Refill: 1  8. Mixed hyperlipidemia Low fat diet - atorvastatin (LIPITOR) 40 MG tablet; Take 1 tablet (40 mg total) by mouth daily.  Dispense: 90 tablet; Refill: 1    Labs pending Health maintenance reviewed Diet and exercise encouraged Continue all meds Follow up  In 3 month   [place], FNP

## 2015-12-21 LAB — CMP14+EGFR
ALK PHOS: 87 IU/L (ref 39–117)
ALT: 21 IU/L (ref 0–44)
AST: 13 IU/L (ref 0–40)
Albumin/Globulin Ratio: 1.6 (ref 1.2–2.2)
Albumin: 4 g/dL (ref 3.6–4.8)
BUN/Creatinine Ratio: 14 (ref 10–24)
BUN: 18 mg/dL (ref 8–27)
Bilirubin Total: 0.3 mg/dL (ref 0.0–1.2)
CALCIUM: 9.1 mg/dL (ref 8.6–10.2)
CO2: 26 mmol/L (ref 18–29)
CREATININE: 1.3 mg/dL — AB (ref 0.76–1.27)
Chloride: 102 mmol/L (ref 96–106)
GFR calc Af Amer: 64 mL/min/{1.73_m2} (ref >59)
GFR, EST NON AFRICAN AMERICAN: 56 mL/min/{1.73_m2} — AB (ref >59)
GLUCOSE: 133 mg/dL — AB (ref 65–99)
Globulin, Total: 2.5 g/dL (ref 1.5–4.5)
Potassium: 5 mmol/L (ref 3.5–5.2)
Sodium: 141 mmol/L (ref 134–144)
Total Protein: 6.5 g/dL (ref 6.0–8.5)

## 2015-12-21 LAB — LIPID PANEL
CHOL/HDL RATIO: 3.7 ratio (ref 0.0–5.0)
CHOLESTEROL TOTAL: 103 mg/dL (ref 100–199)
HDL: 28 mg/dL — AB (ref >39)
LDL CALC: 48 mg/dL (ref 0–99)
TRIGLYCERIDES: 137 mg/dL (ref 0–149)
VLDL Cholesterol Cal: 27 mg/dL (ref 5–40)

## 2015-12-26 ENCOUNTER — Telehealth: Payer: Self-pay | Admitting: Nurse Practitioner

## 2015-12-26 NOTE — Telephone Encounter (Signed)
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

## 2015-12-27 ENCOUNTER — Telehealth: Payer: Self-pay | Admitting: Nurse Practitioner

## 2015-12-27 NOTE — Telephone Encounter (Signed)
Discussed further DOT requirements with patient. Per DOT protocol patient must have a stress test yearly due to history of bypass surgery. 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

## 2015-12-28 ENCOUNTER — Ambulatory Visit (INDEPENDENT_AMBULATORY_CARE_PROVIDER_SITE_OTHER): Payer: Medicare Other | Admitting: Cardiology

## 2015-12-28 ENCOUNTER — Encounter: Payer: Self-pay | Admitting: Cardiology

## 2015-12-28 DIAGNOSIS — I10 Essential (primary) hypertension: Secondary | ICD-10-CM | POA: Diagnosis not present

## 2015-12-28 DIAGNOSIS — I2510 Atherosclerotic heart disease of native coronary artery without angina pectoris: Secondary | ICD-10-CM | POA: Diagnosis not present

## 2015-12-28 DIAGNOSIS — E782 Mixed hyperlipidemia: Secondary | ICD-10-CM | POA: Diagnosis not present

## 2015-12-28 MED ORDER — LISINOPRIL 2.5 MG PO TABS: 2.5000 mg | ORAL_TABLET | Freq: Every day | ORAL | 11 refills | Status: DC

## 2015-12-28 MED ORDER — ATORVASTATIN CALCIUM 40 MG PO TABS: 40.0000 mg | ORAL_TABLET | Freq: Every day | ORAL | 11 refills | Status: DC

## 2015-12-28 MED ORDER — METOPROLOL SUCCINATE ER 50 MG PO TB24: 50.0000 mg | ORAL_TABLET | Freq: Every day | ORAL | 11 refills | Status: DC

## 2015-12-28 NOTE — Patient Instructions (Signed)
Medication Instructions:  Your physician recommends that you continue on your current medications as directed. Please refer to the Current Medication list given to you today. 1.  All Cardiac medications were filled at office visit for a 30 day supply to patient's requested pharmacy.    Labwork: -None  Testing/Procedures: -None  Follow-Up: Your physician wants you to follow-up in: September with Dr. Angelena Form.  You will receive a reminder letter in the mail two months in advance. If you don't receive a letter, please call our office to schedule the follow-up appointment @ [phone].   Any Other Special Instructions Will Be Listed Below (If Applicable).     If you need a refill on your cardiac medications before your next appointment, please call your pharmacy.

## 2015-12-28 NOTE — Progress Notes (Signed)
12/28/2015 Jacob Rios   [birth year]  [id number]  Primary Physician [place] [place] Primary Cardiologist: Dr. Angelena Form    Reason for Visit/CC: 1 year F/u for CAD  HPI:  70 yo male with history of CAD s/p CABG 2000, HTN, HLD, DM and former tobacco abuse here today for cardiac follow up. He has been followed in the past by Dr. Olevia Perches, now followed by Dr. Angelena Form.  In 2000 he had an MI with subsequent bypass surgery (last cath 2005 with occluded LIMA to LAD-native LAD open, patent SVG to diagonal and patent RIMA to RCA). He quit smoking in 1999. He has now fully retired. He was a Emergency planning/management officer for 28 years. Stress myoview 12/01/12 with no ischemia.  His PCP follows his lipid profile.   He presents for 1 year f/u. He has done well since his last OV. No new issues. He is active. Performs yard work of moderate activity often w/o exertional CP or dyspnea. No dizziness, syncope/ near syncope, palpitations, orthopnea or PND. He reports full medication compliance. EKG shows NSR w/o ischemia. HR and BP are both well controlled.    Current Meds  Medication Sig  . aspirin 81 MG tablet Take 81 mg by mouth daily.  Marland Kitchen atorvastatin (LIPITOR) 40 MG tablet Take 1 tablet (40 mg total) by mouth daily.  Marland Kitchen glipiZIDE (GLUCOTROL XL) 10 MG 24 hr tablet Take 1 tablet (10 mg total) by mouth daily.  Marland Kitchen glucose blood (ONETOUCH VERIO) test strip Test 1X per day and as needed  Dx 250.02  . Lancets (ONETOUCH ULTRASOFT) lancets Patient test 1X per day and prn  Dx 250.02  . lisinopril (PRINIVIL,ZESTRIL) 2.5 MG tablet Take 1 tablet (2.5 mg total) by mouth daily.  . metFORMIN (GLUCOPHAGE) 1000 MG tablet Take 1 tablet (1,000 mg total) by mouth 2 (two) times daily with a meal.  . metoprolol succinate (TOPROL-XL) 50 MG 24 hr tablet Take 1 tablet (50 mg total) by mouth daily.  . Omega-3 Fatty Acids (FISH OIL) 1000 MG CAPS Take 1 capsule by mouth daily.   Marland Kitchen omeprazole (PRILOSEC) 40 MG capsule Take 1 capsule (40 mg  total) by mouth daily.  . vitamin C (ASCORBIC ACID) 500 MG tablet Take 500 mg by mouth daily.     Allergies  Allergen Reactions  . Morphine Swelling and Other (See Comments)    PT DOESNT REMEMBER /    Past Medical History:  Diagnosis Date  . Coronary artery disease    3V CABG 2000. Last heart cath 2005, with 2 patent grafts  . Diabetes mellitus   . Hyperlipidemia   . Hypertension    Family History  Problem Relation Age of Onset  . Lung cancer Mother   . Cancer Mother   . Dementia Father   . Hyperlipidemia Father   . Heart attack Neg Hx   . Stroke Neg Hx    Past Surgical History:  Procedure Laterality Date  . APPENDECTOMY    . CARDIAC CATHETERIZATION  2005  . CORONARY ARTERY BYPASS GRAFT  2000   3 vessel  . HERNIA REPAIR     Social History   Social History  . Marital status: Married    Spouse name: N/A  . Number of children: N/A  . Years of education: N/A   Occupational History  . part-time trucker    Social History Main Topics  . Smoking status: Former Smoker    Quit date: 01/13/1997  . Smokeless tobacco: Never Used  .  Alcohol use No  . Drug use: No  . Sexual activity: Not on file   Other Topics Concern  . Not on file   Social History Narrative  . No narrative on file     Review of Systems: General: negative for chills, fever, night sweats or weight changes.  Cardiovascular: negative for chest pain, dyspnea on exertion, edema, orthopnea, palpitations, paroxysmal nocturnal dyspnea or shortness of breath Dermatological: negative for rash Respiratory: negative for cough or wheezing Urologic: negative for hematuria Abdominal: negative for nausea, vomiting, diarrhea, bright red blood per rectum, melena, or hematemesis Neurologic: negative for visual changes, syncope, or dizziness All other systems reviewed and are otherwise negative except as noted above.   Physical Exam:  Blood pressure 138/78, pulse 76, height 5' 11.5" (1.816 m), weight 254 lb 4 oz  (115.3 kg).  General appearance: alert, cooperative and no distress Neck: no carotid bruit and no JVD Lungs: clear to auscultation bilaterally Heart: regular rate and rhythm, S1, S2 normal, no murmur, click, rub or gallop Extremities: extremities normal, atraumatic, no cyanosis or edema Pulses: 2+ and symmetric Skin: Skin color, texture, turgor normal. No rashes or lesions Neurologic: Grossly normal  EKG NSR. No ischemia. 74 bpm.   ASSESSMENT AND PLAN:   1. CAD: s/p CABG in 2000 with LHC in 2005 showing occluded LIMA to LAD-native LAD open, patent SVG to diagonal and patent RIMA to RCA. Stress myoview 12/01/12 with no ischemia. He is stable w/o angina. Continue medical therapy for secondary prevention: ASA, BB, ACE-I and statin.   2. HTN: BP is well controlled on current regimen.  3. HLD: Lipid profile followed by PCP. On statin therapy with Lipitor.   4. DM: followed by PCP. Well controlled, per patient. He notes recent Hgb A1c was 7.   PLAN  F/u in 1 year with Dr. Claudie Revering PA-C 12/28/2015 9:09 AM

## 2015-12-31 ENCOUNTER — Telehealth: Payer: Self-pay | Admitting: *Deleted

## 2015-12-31 NOTE — Telephone Encounter (Signed)
Informed pt that due to his bypass surgery he has to have a stress test annually. His last one was 3 years ago

## 2016-02-06 ENCOUNTER — Encounter (INDEPENDENT_AMBULATORY_CARE_PROVIDER_SITE_OTHER): Payer: Self-pay

## 2016-02-06 ENCOUNTER — Encounter: Payer: Self-pay | Admitting: Family Medicine

## 2016-02-06 ENCOUNTER — Ambulatory Visit (INDEPENDENT_AMBULATORY_CARE_PROVIDER_SITE_OTHER): Payer: Medicare Other | Admitting: Family Medicine

## 2016-02-06 ENCOUNTER — Telehealth: Payer: Self-pay | Admitting: Nurse Practitioner

## 2016-02-06 VITALS — BP 129/75 | HR 81 | Temp 97.9°F | Ht 71.5 in | Wt 247.8 lb

## 2016-02-06 DIAGNOSIS — J209 Acute bronchitis, unspecified: Secondary | ICD-10-CM

## 2016-02-06 MED ORDER — HYDROCODONE-HOMATROPINE 5-1.5 MG/5ML PO SYRP: 5.0000 mL | ORAL_SOLUTION | Freq: Three times a day (TID) | ORAL | 0 refills | Status: DC | PRN

## 2016-02-06 MED ORDER — AZITHROMYCIN 250 MG PO TABS: ORAL_TABLET | ORAL | 0 refills | Status: DC

## 2016-02-06 NOTE — Telephone Encounter (Signed)
Wife states that patient has been sick in the bed since Sunday, with nasal congestion, cough, fever- 100 today and body aches. Wife would like rx called in. Patient was last seen 12/20/15. Covering PCP, please advise

## 2016-02-06 NOTE — Progress Notes (Signed)
   Subjective:    Patient ID: Jacob Rios, male    DOB: Jun 09, 1945, 71 y.o.   MRN: [id number]  HPI 71 year old gentleman with sinus congestion that has gone to his chest. He has had cough now for 3 or 4 days it's mostly nonproductive. He has not had any fever or myalgias sore throat or headache.  Patient Active Problem List   Diagnosis Date Noted  . Gastroesophageal reflux disease without esophagitis 02/19/2015  . Hyperlipemia 07/20/2014  . BMI 35.0-35.9,adult 07/20/2014  . Diabetes mellitus ([place]) 10/21/2010  . Essential hypertension 10/26/2008  . CAD, AUTOLOGOUS BYPASS GRAFT 10/26/2008   Outpatient Encounter Prescriptions as of 02/06/2016  Medication Sig  . aspirin 81 MG tablet Take 81 mg by mouth daily.  Marland Kitchen atorvastatin (LIPITOR) 40 MG tablet Take 1 tablet (40 mg total) by mouth daily.  Marland Kitchen glipiZIDE (GLUCOTROL XL) 10 MG 24 hr tablet Take 1 tablet (10 mg total) by mouth daily.  Marland Kitchen glucose blood (ONETOUCH VERIO) test strip Test 1X per day and as needed  Dx 250.02  . Lancets (ONETOUCH ULTRASOFT) lancets Patient test 1X per day and prn  Dx 250.02  . lisinopril (PRINIVIL,ZESTRIL) 2.5 MG tablet Take 1 tablet (2.5 mg total) by mouth daily.  . metFORMIN (GLUCOPHAGE) 1000 MG tablet Take 1 tablet (1,000 mg total) by mouth 2 (two) times daily with a meal.  . metoprolol succinate (TOPROL-XL) 50 MG 24 hr tablet Take 1 tablet (50 mg total) by mouth daily.  . Omega-3 Fatty Acids (FISH OIL) 1000 MG CAPS Take 1 capsule by mouth daily.   Marland Kitchen omeprazole (PRILOSEC) 40 MG capsule Take 1 capsule (40 mg total) by mouth daily.  . vitamin C (ASCORBIC ACID) 500 MG tablet Take 500 mg by mouth daily.     No facility-administered encounter medications on file as of 02/06/2016.       Review of Systems  Constitutional: Negative.   HENT: Positive for congestion.   Respiratory: Positive for cough.        Objective:   Physical Exam  Constitutional: He is oriented to person, place, and time. He appears  well-developed and well-nourished. No distress.  Cardiovascular: Normal rate and regular rhythm.   Pulmonary/Chest: Effort normal. He has wheezes.  Neurological: He is alert and oriented to person, place, and time.  Psychiatric: He has a normal mood and affect. His behavior is normal.   BP 129/75   Pulse 81   Temp 97.9 F (36.6 C) (Oral)   Ht 5' 11.5" (1.816 m)   Wt 247 lb 12.8 oz (112.4 kg)   BMI 34.08 kg/m         Assessment & Plan:  1. Acute bronchitis, unspecified organism Continue Mucinex in the daytime and take Hycodan at bedtime for suppression. Rx for Z-Pak. Drink plenty of fluids.  Wardell Honour MD

## 2016-02-06 NOTE — Telephone Encounter (Signed)
Wife aware

## 2016-02-06 NOTE — Telephone Encounter (Signed)
Needs to be seen

## 2016-03-12 ENCOUNTER — Encounter: Payer: Self-pay | Admitting: Family Medicine

## 2016-03-12 ENCOUNTER — Ambulatory Visit (INDEPENDENT_AMBULATORY_CARE_PROVIDER_SITE_OTHER): Payer: Medicare Other | Admitting: Family Medicine

## 2016-03-12 VITALS — BP 136/76 | HR 69 | Temp 97.3°F | Ht 71.5 in | Wt 252.6 lb

## 2016-03-12 DIAGNOSIS — S39012A Strain of muscle, fascia and tendon of lower back, initial encounter: Secondary | ICD-10-CM | POA: Diagnosis not present

## 2016-03-12 MED ORDER — CYCLOBENZAPRINE HCL 5 MG PO TABS: 5.0000 mg | ORAL_TABLET | Freq: Three times a day (TID) | ORAL | 0 refills | Status: DC | PRN

## 2016-03-12 NOTE — Patient Instructions (Signed)
Great to meet you!  Try heat, massage and use cyclobenzaprine as needed.   At night you can take 2 tabs at once if needed.

## 2016-03-12 NOTE — Progress Notes (Signed)
   HPI  Patient presents today here with Back pain.  Patient explains that he used lower and using a weed eater for about 3 hours yesterday, since that time he's had right-sided paraspinal muscle back pain in the thoracic and upper lumbar area.  No leg symptoms. Some radiation around to his right flank.  Patient has tried Motrin, Aleve, heat, ice with no improvement.  He has a history of diabetes, last A1c was 7.5. CAD status post CABG.  PMH: Smoking status noted ROS: Per HPI  Objective: BP 136/76   Pulse 69   Temp 97.3 F (36.3 C) (Oral)   Ht 5' 11.5" (1.816 m)   Wt 252 lb 9.6 oz (114.6 kg)   BMI 34.74 kg/m  Gen: NAD, alert, cooperative with exam HEENT: NCAT CV: RRR, good S1/S2, no murmur Resp: CTABL, no wheezes, non-labored Ext: No edema, warm Neuro: Alert and oriented, No gross deficits  MSK:  Tenderness to palpation of the right-sided paraspinal muscles in the thoracic area, no midline tenderness.   Assessment and plan:  # Back Strain Treat with a small amount of muscle relaxers, and discussed with patient that other viable options which are high dose NSAIDs as well as short course of prednisone will have risks with him considering CAD and diabetes respectively. No indication for x-ray at this time. Discussed risk of sedation with Flexeril, try 5 mg first, 10 mg at night if needed   Meds ordered this encounter  Medications  . cyclobenzaprine (FLEXERIL) 5 MG tablet    Sig: Take 1 tablet (5 mg total) by mouth 3 (three) times daily as needed for muscle spasms.    Dispense:  30 tablet    Refill:  0    Laroy Apple, MD [facility] 03/12/2016, 3:09 PM

## 2016-03-18 ENCOUNTER — Encounter: Payer: Self-pay | Admitting: Family Medicine

## 2016-03-18 ENCOUNTER — Ambulatory Visit (INDEPENDENT_AMBULATORY_CARE_PROVIDER_SITE_OTHER): Payer: Medicare Other | Admitting: Family Medicine

## 2016-03-18 VITALS — BP 153/80 | HR 75 | Temp 97.4°F | Ht 71.5 in | Wt 251.2 lb

## 2016-03-18 DIAGNOSIS — M546 Pain in thoracic spine: Secondary | ICD-10-CM | POA: Diagnosis not present

## 2016-03-18 MED ORDER — PREDNISONE 20 MG PO TABS: 40.0000 mg | ORAL_TABLET | Freq: Every day | ORAL | 0 refills | Status: DC

## 2016-03-18 NOTE — Progress Notes (Signed)
   HPI  Patient presents today here with right-sided thoracic back pain.  Patient was seen 7 days ago for similar pain, however he had good improvement after starting Flexeril. He states that about 1-1/2 days ago he was completely better and then spent an entire day walking and working. He had recurrence of right-sided thoracic back pain.  Flexeril is not helping. He is taking NSAIDs, however he has a history of CABG.  Patient states that he previously did very well with an anti-inflammatory. He can arm or the name of the medication.  He takes glipizide 1 pill once daily and metformin one half tablet twice daily. He tolerates one whole tablet of metformin easily.  No injury, no leg symps.   PMH: Smoking status noted ROS: Per HPI  Objective: BP (!) 153/80   Pulse 75   Temp 97.4 F (36.3 C) (Oral)   Ht 5' 11.5" (1.816 m)   Wt 251 lb 3.2 oz (113.9 kg)   BMI 34.55 kg/m  Gen: NAD, alert, cooperative with exam HEENT: NCAT CV: RRR, good S1/S2, no murmur Resp: CTABL, no wheezes, non-labored Ext: No edema, warm Neuro: Alert and oriented, No gross deficits  Assessment and plan:  # Acute right-sided thoracic back pain Flexeril helped temporarily, recommended DC NSAIDs with known CAD Prednisone, discussed hyperglycemia.  Increase metformin to full dose for th e5 days.  RTC with any concerns  GFR >45 so ok with increased metformin.   Meds ordered this encounter  Medications  . predniSONE (DELTASONE) 20 MG tablet    Sig: Take 2 tablets (40 mg total) by mouth daily with breakfast.    Dispense:  10 tablet    Refill:  0    Laroy Apple, MD [facility] 03/18/2016, 1:57 PM    h

## 2016-03-18 NOTE — Patient Instructions (Signed)
Great to see you!  I have prescribed prednisone, this will elevate your blood sugar tests. I recommend continuing glipizide and increasing your metformin to one whole pill twice daily.

## 2016-03-19 ENCOUNTER — Telehealth: Payer: Self-pay | Admitting: Nurse Practitioner

## 2016-03-19 NOTE — Telephone Encounter (Signed)
Aware can take one am and one pm

## 2016-05-13 ENCOUNTER — Ambulatory Visit: Payer: Medicare Other | Admitting: Family Medicine

## 2016-05-14 ENCOUNTER — Ambulatory Visit (INDEPENDENT_AMBULATORY_CARE_PROVIDER_SITE_OTHER): Payer: Medicare Other | Admitting: Family Medicine

## 2016-05-14 ENCOUNTER — Encounter: Payer: Self-pay | Admitting: Family Medicine

## 2016-05-14 VITALS — BP 136/86 | HR 69 | Temp 97.5°F | Ht 71.5 in | Wt 247.0 lb

## 2016-05-14 DIAGNOSIS — E119 Type 2 diabetes mellitus without complications: Secondary | ICD-10-CM | POA: Diagnosis not present

## 2016-05-14 DIAGNOSIS — Z6835 Body mass index (BMI) 35.0-35.9, adult: Secondary | ICD-10-CM

## 2016-05-14 DIAGNOSIS — E782 Mixed hyperlipidemia: Secondary | ICD-10-CM | POA: Diagnosis not present

## 2016-05-14 DIAGNOSIS — I10 Essential (primary) hypertension: Secondary | ICD-10-CM | POA: Diagnosis not present

## 2016-05-14 LAB — BAYER DCA HB A1C WAIVED: HB A1C (BAYER DCA - WAIVED): 7.4 % — ABNORMAL HIGH (ref <7.0)

## 2016-05-14 MED ORDER — LISINOPRIL 2.5 MG PO TABS: 2.5000 mg | ORAL_TABLET | Freq: Every day | ORAL | 11 refills | Status: DC

## 2016-05-14 MED ORDER — METOPROLOL SUCCINATE ER 50 MG PO TB24: 50.0000 mg | ORAL_TABLET | Freq: Every day | ORAL | 11 refills | Status: DC

## 2016-05-14 MED ORDER — ATORVASTATIN CALCIUM 40 MG PO TABS: 40.0000 mg | ORAL_TABLET | Freq: Every day | ORAL | 11 refills | Status: DC

## 2016-05-14 MED ORDER — METFORMIN HCL 1000 MG PO TABS: 1000.0000 mg | ORAL_TABLET | Freq: Two times a day (BID) | ORAL | 1 refills | Status: DC

## 2016-05-14 MED ORDER — GLIPIZIDE ER 10 MG PO TB24: 10.0000 mg | ORAL_TABLET | Freq: Every day | ORAL | 1 refills | Status: DC

## 2016-05-14 MED ORDER — OMEPRAZOLE 40 MG PO CPDR: 40.0000 mg | DELAYED_RELEASE_CAPSULE | Freq: Every day | ORAL | 1 refills | Status: DC

## 2016-05-14 NOTE — Progress Notes (Signed)
BP 136/86   Pulse 69   Temp 97.5 F (36.4 C) (Oral)   Ht 5' 11.5" (1.816 m)   Wt 247 lb (112 kg)   BMI 33.97 kg/m    Subjective:    Patient ID: Jacob Rios, male    DOB: 17-Feb-1945, 71 y.o.   MRN: [id number]  HPI: DEWEL LOTTER is a 71 y.o. male presenting on 05/14/2016 for Hyperlipidemia (followup, labwork patient is fasting, dr. Wendi Snipes patient); Hypertension; and Diabetes   HPI Type 2 diabetes mellitus Patient comes in today for recheck of his diabetes. Patient has been currently taking Glipizide and metformin. Patient is currently on an ACE inhibitor. Patient has seen an ophthalmologist this year. Patient denies any issues with his feet.   Hypertension recheck Patient's blood pressure today is 136/86 and he is currently on lisinopril and metoprolol. Patient denies headaches, blurred vision, chest pains, shortness of breath, or weakness. Denies any side effects from medication and is content with current medication.   Hyperlipidemia Patient is coming in for recheck of his hyperlipidemia. He is currently taking atorvastatin. He denies any issues with myalgias or history of liver damage from it. He denies any focal numbness or weakness or chest pain. Patient does have obesity and discuss working towards weight loss if possible  Relevant past medical, surgical, family and social history reviewed and updated as indicated. Interim medical history since our last visit reviewed. Allergies and medications reviewed and updated.  Review of Systems  Constitutional: Negative for chills and fever.  Respiratory: Negative for shortness of breath and wheezing.   Cardiovascular: Negative for chest pain and leg swelling.  Musculoskeletal: Negative for back pain and gait problem.  Skin: Negative for rash.  Neurological: Negative for dizziness, weakness, light-headedness and numbness.  All other systems reviewed and are negative.  Per HPI unless specifically indicated above       Objective:    BP 136/86   Pulse 69   Temp 97.5 F (36.4 C) (Oral)   Ht 5' 11.5" (1.816 m)   Wt 247 lb (112 kg)   BMI 33.97 kg/m   Wt Readings from Last 3 Encounters:  05/14/16 247 lb (112 kg)  03/18/16 251 lb 3.2 oz (113.9 kg)  03/12/16 252 lb 9.6 oz (114.6 kg)    Physical Exam  Constitutional: He is oriented to person, place, and time. He appears well-developed and well-nourished. No distress.  Eyes: Conjunctivae are normal. No scleral icterus.  Neck: Neck supple. No thyromegaly present.  Cardiovascular: Normal rate, regular rhythm, normal heart sounds and intact distal pulses.   No murmur heard. Pulmonary/Chest: Effort normal and breath sounds normal. No respiratory distress. He has no wheezes. He has no rales.  Musculoskeletal: Normal range of motion. He exhibits no edema.  Lymphadenopathy:    He has no cervical adenopathy.  Neurological: He is alert and oriented to person, place, and time. Coordination normal.  Skin: Skin is warm and dry. No rash noted. He is not diaphoretic.  Psychiatric: He has a normal mood and affect. His behavior is normal.  Nursing note and vitals reviewed.       Assessment & Plan:   Problem List Items Addressed This Visit      Cardiovascular and Mediastinum   Essential hypertension   Relevant Medications   lisinopril (PRINIVIL,ZESTRIL) 2.5 MG tablet   atorvastatin (LIPITOR) 40 MG tablet   metoprolol succinate (TOPROL-XL) 50 MG 24 hr tablet   Other Relevant Orders   CMP14+EGFR   Lipid  panel     Endocrine   Diabetes mellitus (HCC)   Relevant Medications   lisinopril (PRINIVIL,ZESTRIL) 2.5 MG tablet   atorvastatin (LIPITOR) 40 MG tablet   metFORMIN (GLUCOPHAGE) 1000 MG tablet   glipiZIDE (GLUCOTROL XL) 10 MG 24 hr tablet   Other Relevant Orders   Bayer DCA Hb A1c Waived   Lipid panel     Other   Hyperlipemia - Primary   Relevant Medications   lisinopril (PRINIVIL,ZESTRIL) 2.5 MG tablet   atorvastatin (LIPITOR) 40 MG tablet    metoprolol succinate (TOPROL-XL) 50 MG 24 hr tablet   Other Relevant Orders   Lipid panel   BMI 35.0-35.9,adult       Follow up plan: Return in about 3 months (around 08/14/2016), or if symptoms worsen or fail to improve, for Recheck diabetes and hypertension.  Counseling provided for all of the vaccine components Orders Placed This Encounter  Procedures  . CMP14+EGFR  . Bayer DCA Hb A1c Waived  . Lipid panel    Caryl Pina, MD [facility] 05/14/2016, 8:19 AM

## 2016-05-15 LAB — CMP14+EGFR
ALT: 15 IU/L (ref 0–44)
AST: 14 IU/L (ref 0–40)
Albumin/Globulin Ratio: 1.5 (ref 1.2–2.2)
Albumin: 4.2 g/dL (ref 3.6–4.8)
Alkaline Phosphatase: 76 IU/L (ref 39–117)
BILIRUBIN TOTAL: 0.4 mg/dL (ref 0.0–1.2)
BUN/Creatinine Ratio: 14 (ref 10–24)
BUN: 21 mg/dL (ref 8–27)
CO2: 23 mmol/L (ref 18–29)
CREATININE: 1.54 mg/dL — AB (ref 0.76–1.27)
Calcium: 9 mg/dL (ref 8.6–10.2)
Chloride: 102 mmol/L (ref 96–106)
GFR, EST AFRICAN AMERICAN: 52 mL/min/{1.73_m2} — AB (ref >59)
GFR, EST NON AFRICAN AMERICAN: 45 mL/min/{1.73_m2} — AB (ref >59)
GLUCOSE: 125 mg/dL — AB (ref 65–99)
Globulin, Total: 2.8 g/dL (ref 1.5–4.5)
POTASSIUM: 5 mmol/L (ref 3.5–5.2)
Sodium: 140 mmol/L (ref 134–144)
Total Protein: 7 g/dL (ref 6.0–8.5)

## 2016-05-15 LAB — LIPID PANEL
CHOL/HDL RATIO: 4.4 ratio (ref 0.0–5.0)
Cholesterol, Total: 123 mg/dL (ref 100–199)
HDL: 28 mg/dL — AB (ref >39)
LDL CALC: 68 mg/dL (ref 0–99)
Triglycerides: 135 mg/dL (ref 0–149)
VLDL CHOLESTEROL CAL: 27 mg/dL (ref 5–40)

## 2016-05-15 MED ORDER — SITAGLIPTIN PHOSPHATE 50 MG PO TABS: 50.0000 mg | ORAL_TABLET | Freq: Every day | ORAL | 0 refills | Status: DC

## 2016-05-15 NOTE — Addendum Note (Signed)
Addended by: Nigel Berthold C on: 05/15/2016 09:57 AM   Modules accepted: Orders

## 2016-05-16 ENCOUNTER — Telehealth: Payer: Self-pay | Admitting: Nurse Practitioner

## 2016-05-19 NOTE — Telephone Encounter (Signed)
Dr. Warrick Parisian- you started him on this because you saw him for follow up last.

## 2016-05-19 NOTE — Telephone Encounter (Signed)
Patient needs to call his insurance and asked them why it cost so much, it may just be a one time deductible charge and that will be cheaper after this month. If not he needs to ask if there are any others in the class that will be cheaper.

## 2016-05-20 NOTE — Telephone Encounter (Signed)
Pt left a detailed message about calling the insurance - he will let us know what they tell him

## 2016-05-26 ENCOUNTER — Telehealth: Payer: Self-pay | Admitting: Nurse Practitioner

## 2016-05-26 NOTE — Telephone Encounter (Signed)
Yes then go ahead and increase the metformin to 1000 mg twice a day and we will see if we need anything further from there.

## 2016-05-26 NOTE — Telephone Encounter (Signed)
Pt notified of recommendation Verbalizes understanding 

## 2016-05-26 NOTE — Telephone Encounter (Signed)
Per pt, he is breaking the Metformin1000mg  in half and taking BID Pt wants to go back to 1000mg  (whole tablet) BID Please advise He does not want to try Januvia due to TV commercials

## 2016-05-26 NOTE — Telephone Encounter (Signed)
Please review and advise.

## 2016-05-26 NOTE — Telephone Encounter (Signed)
She is artery on the max of metformin which is 1000 mg twice a day. We cannot go any higher than that. Ask him what kind of issues he has with Januvia and if he wants to discuss with me we can have him call and talk to me if he needs to.

## 2016-06-04 ENCOUNTER — Other Ambulatory Visit: Payer: Medicare Other

## 2016-06-13 ENCOUNTER — Ambulatory Visit (INDEPENDENT_AMBULATORY_CARE_PROVIDER_SITE_OTHER): Payer: Medicare Other | Admitting: Urology

## 2016-06-13 DIAGNOSIS — R351 Nocturia: Secondary | ICD-10-CM | POA: Diagnosis not present

## 2016-06-13 DIAGNOSIS — N403 Nodular prostate with lower urinary tract symptoms: Secondary | ICD-10-CM | POA: Diagnosis not present

## 2016-06-13 DIAGNOSIS — C61 Malignant neoplasm of prostate: Secondary | ICD-10-CM

## 2016-06-16 LAB — HM DIABETES EYE EXAM

## 2016-07-08 ENCOUNTER — Ambulatory Visit (INDEPENDENT_AMBULATORY_CARE_PROVIDER_SITE_OTHER): Payer: Medicare Other

## 2016-07-08 ENCOUNTER — Encounter: Payer: Self-pay | Admitting: Family Medicine

## 2016-07-08 ENCOUNTER — Ambulatory Visit (INDEPENDENT_AMBULATORY_CARE_PROVIDER_SITE_OTHER): Payer: Medicare Other | Admitting: Family Medicine

## 2016-07-08 VITALS — BP 128/70 | HR 76 | Temp 97.4°F | Ht 71.5 in | Wt 250.0 lb

## 2016-07-08 DIAGNOSIS — T1490XA Injury, unspecified, initial encounter: Secondary | ICD-10-CM

## 2016-07-08 DIAGNOSIS — S83412A Sprain of medial collateral ligament of left knee, initial encounter: Secondary | ICD-10-CM

## 2016-07-08 DIAGNOSIS — M25562 Pain in left knee: Secondary | ICD-10-CM | POA: Diagnosis not present

## 2016-07-08 DIAGNOSIS — M1712 Unilateral primary osteoarthritis, left knee: Secondary | ICD-10-CM | POA: Diagnosis not present

## 2016-07-08 MED ORDER — TRAMADOL HCL 50 MG PO TABS: ORAL_TABLET | ORAL | 0 refills | Status: DC

## 2016-07-08 NOTE — Progress Notes (Signed)
Chief Complaint  Patient presents with  . Knee Pain    pt here today c/o left knee pain x 1 month and then today he was stepping across a ditch and re-injured the knee.    HPI  Patient presents today for Stepped across a ditch today and injured his knee. He had some discomfort from when he twisted it about a month ago and had weakened a bit. Then today as he was weeding he stepped across about a 1 foot wide ditch and felt severe sudden pain throughout the knee but focused at the level of the medial collateral. He has been hobbling, having difficulty walking.  PMH: Smoking status noted ROS: Per HPI  Objective: BP 128/70   Pulse 76   Temp 97.4 F (36.3 C) (Oral)   Ht 5' 11.5" (1.816 m)   Wt 250 lb (113.4 kg)   BMI 34.38 kg/m  Gen: NAD, alert, cooperative with exam HEENT: NCAT, EOMI, PERRL CV: RRR, good S1/S2, no murmur Resp: CTABL, no wheezes, non-labored Ext: No edema, warm and tender to palpation throughout the anterior joint line somewhat more at the medial aspect. Jump off the table tenderness with valgus stress at the medial collateral. Also positive McMurray's sign Neuro: Alert and oriented, No gross deficits Knee x-ray shows no acute injury but marked arthritis including multiple spurs. Assessment and plan:  1. Injury   2. Sprain of medial collateral ligament of left knee, initial encounter   3. Arthritis of left knee     Meds ordered this encounter  Medications  . traMADol (ULTRAM) 50 MG tablet    Sig: One or two four times a day as needed for pain    Dispense:  24 tablet    Refill:  0    Orders Placed This Encounter  Procedures  . DG Knee 1-2 Views Left    Standing Status:   Future    Number of Occurrences:   1    Standing Expiration Date:   09/07/2017    Order Specific Question:   Reason for Exam (SYMPTOM  OR DIAGNOSIS REQUIRED)    Answer:   pain    Order Specific Question:   Preferred imaging location?    Answer:   Internal  . Ambulatory referral to  Orthopedics    Referral Priority:   Routine    Referral Type:   Consultation    Number of Visits Requested:   1    Follow up as needed.  Claretta Fraise, MD

## 2016-08-14 ENCOUNTER — Ambulatory Visit: Payer: Medicare Other | Admitting: Family Medicine

## 2016-08-18 ENCOUNTER — Encounter: Payer: Self-pay | Admitting: Family Medicine

## 2016-08-18 ENCOUNTER — Ambulatory Visit (INDEPENDENT_AMBULATORY_CARE_PROVIDER_SITE_OTHER): Payer: Medicare Other | Admitting: Family Medicine

## 2016-08-18 VITALS — BP 155/84 | HR 65 | Temp 97.1°F | Ht 71.5 in | Wt 242.2 lb

## 2016-08-18 DIAGNOSIS — E1122 Type 2 diabetes mellitus with diabetic chronic kidney disease: Secondary | ICD-10-CM

## 2016-08-18 DIAGNOSIS — N183 Chronic kidney disease, stage 3 unspecified: Secondary | ICD-10-CM | POA: Insufficient documentation

## 2016-08-18 DIAGNOSIS — I10 Essential (primary) hypertension: Secondary | ICD-10-CM | POA: Diagnosis not present

## 2016-08-18 DIAGNOSIS — E119 Type 2 diabetes mellitus without complications: Secondary | ICD-10-CM | POA: Diagnosis not present

## 2016-08-18 LAB — BAYER DCA HB A1C WAIVED: HB A1C: 6.4 % (ref <7.0)

## 2016-08-18 NOTE — Progress Notes (Signed)
   HPI  Patient presents today here for follow up chronic medical problems  Hypertension Good medication compliance No headache or chest pain.  Type 2 diabetes Good medication compliance, no hypoglycemia Average fasting blood sugar 120-130  Chronic kidney disease Discussed, understands every 3-4 month labs.  PMH: Smoking status noted ROS: Per HPI  Objective: BP (!) 155/84   Pulse 65   Temp (!) 97.1 F (36.2 C) (Oral)   Ht 5' 11.5" (1.816 m)   Wt 242 lb 3.2 oz (109.9 kg)   BMI 33.31 kg/m  Gen: NAD, alert, cooperative with exam HEENT: NCAT CV: RRR, good S1/S2, no murmur Resp: CTABL, no wheezes, non-labored Ext: No edema, warm Neuro: Alert and oriented, No gross deficits  Assessment and plan:  # Type 2 diabetes A1c pending, likely well controlled, continue glipizide plus metformin Monitor GFR closely with metformin, may need to decrease by 50%  # Hypertension Reasonably well-controlled No change in medication regimen, lowest measurement today was 471 systolic.  # Chronic kidney disease stage III Likely due to diabetes, hypertension Monitor closely, avoid NSAIDs   Orders Placed This Encounter  Procedures  . Bayer DCA Hb A1c Waived  . [place], MD [place] 08/18/2016, 8:11 AM

## 2016-08-19 LAB — CMP14+EGFR
A/G RATIO: 1.7 (ref 1.2–2.2)
ALBUMIN: 4.4 g/dL (ref 3.6–4.8)
ALT: 15 IU/L (ref 0–44)
AST: 13 IU/L (ref 0–40)
Alkaline Phosphatase: 82 IU/L (ref 39–117)
BUN / CREAT RATIO: 10 (ref 10–24)
BUN: 16 mg/dL (ref 8–27)
Bilirubin Total: 0.4 mg/dL (ref 0.0–1.2)
CO2: 22 mmol/L (ref 20–29)
Calcium: 9.5 mg/dL (ref 8.6–10.2)
Chloride: 100 mmol/L (ref 96–106)
Creatinine, Ser: 1.55 mg/dL — ABNORMAL HIGH (ref 0.76–1.27)
GFR calc non Af Amer: 45 mL/min/{1.73_m2} — ABNORMAL LOW (ref >59)
GFR, EST AFRICAN AMERICAN: 52 mL/min/{1.73_m2} — AB (ref >59)
GLOBULIN, TOTAL: 2.6 g/dL (ref 1.5–4.5)
Glucose: 102 mg/dL — ABNORMAL HIGH (ref 65–99)
POTASSIUM: 4.9 mmol/L (ref 3.5–5.2)
SODIUM: 140 mmol/L (ref 134–144)
TOTAL PROTEIN: 7 g/dL (ref 6.0–8.5)

## 2016-08-27 ENCOUNTER — Other Ambulatory Visit: Payer: Self-pay | Admitting: *Deleted

## 2016-08-27 DIAGNOSIS — E119 Type 2 diabetes mellitus without complications: Secondary | ICD-10-CM

## 2016-10-20 ENCOUNTER — Ambulatory Visit (INDEPENDENT_AMBULATORY_CARE_PROVIDER_SITE_OTHER): Payer: Medicare Other

## 2016-10-20 DIAGNOSIS — Z23 Encounter for immunization: Secondary | ICD-10-CM | POA: Diagnosis not present

## 2016-11-04 ENCOUNTER — Encounter: Payer: Self-pay | Admitting: Family Medicine

## 2016-11-04 ENCOUNTER — Ambulatory Visit (INDEPENDENT_AMBULATORY_CARE_PROVIDER_SITE_OTHER): Payer: Medicare Other | Admitting: Family Medicine

## 2016-11-04 VITALS — BP 135/73 | HR 66 | Temp 98.6°F | Ht 71.5 in | Wt 244.0 lb

## 2016-11-04 DIAGNOSIS — M545 Low back pain, unspecified: Secondary | ICD-10-CM

## 2016-11-04 MED ORDER — CYCLOBENZAPRINE HCL 10 MG PO TABS: 10.0000 mg | ORAL_TABLET | Freq: Every day | ORAL | 0 refills | Status: DC

## 2016-11-04 MED ORDER — PREDNISONE 10 MG (48) PO TBPK: ORAL_TABLET | ORAL | 0 refills | Status: DC

## 2016-11-04 NOTE — Progress Notes (Signed)
**Note Jacob Rios** Subjective:  Patient ID: Jacob Rios, male    DOB: 10/16/1945  Age: 71 y.o. MRN: [id number]  CC: Back Pain (pt here today c/o pulled muscle in his back after pulling heavy trash can last week and his back has been getting worse everyday)    HPI Jacob Rios presents for Left lower back pain moderately severe in spite of rest and use a muscle relaxer. This has been present for 5 days. Onset was after pulling a trashcan for low of heavy refuse from his home to the street for pickup. It has been getting worse in spite of home treatments. The pain is 6-8/10 dull ache without a cramp or sharp feeling. There is some radiation to the left CVA. He denies dysuria and frequency.  Depression screen [facility] 2/9 11/04/2016 08/18/2016 07/08/2016  Decreased Interest 0 0 0  Down, Depressed, Hopeless 0 0 0  PHQ - 2 Score 0 0 0  Altered sleeping - - -  Tired, decreased energy - - -  Change in appetite - - -  Feeling bad or failure about yourself  - - -  Trouble concentrating - - -  Moving slowly or fidgety/restless - - -  Suicidal thoughts - - -  PHQ-9 Score - - -    History Jacob Rios has a past medical history of Coronary artery disease; Diabetes mellitus; Hyperlipidemia; and Hypertension.   He has a past surgical history that includes Coronary artery bypass graft (2000); Hernia repair; Appendectomy; and Cardiac catheterization (2005).   His family history includes Cancer in his mother; Dementia in his father; Hyperlipidemia in his father; Lung cancer in his mother.He reports that he quit smoking about 19 years ago. He has never used smokeless tobacco. He reports that he does not drink alcohol or use drugs.    ROS Review of Systems  Constitutional: Negative for chills, diaphoresis and fever.  HENT: Negative for sore throat.   Cardiovascular: Negative for chest pain.  Gastrointestinal: Negative for abdominal pain.  Musculoskeletal: Positive for arthralgias, back pain, gait problem and myalgias.  Negative for neck pain.  Skin: Negative for rash.  Neurological: Positive for weakness. Negative for numbness.    Objective:  BP 135/73   Pulse 66   Temp 98.6 F (37 C) (Oral)   Ht 5' 11.5" (1.816 m)   Wt 244 lb (110.7 kg)   BMI 33.56 kg/m   BP Readings from Last 3 Encounters:  11/04/16 135/73  08/18/16 (!) 155/84  07/08/16 128/70    Wt Readings from Last 3 Encounters:  11/04/16 244 lb (110.7 kg)  08/18/16 242 lb 3.2 oz (109.9 kg)  07/08/16 250 lb (113.4 kg)     Physical Exam  Constitutional: He is oriented to person, place, and time. He appears well-developed and well-nourished. He appears distressed.  HENT:  Head: Normocephalic.  Eyes: Pupils are equal, round, and reactive to light.  Neck: Normal range of motion.  Cardiovascular: Normal rate, regular rhythm and normal heart sounds.   No murmur heard. Pulmonary/Chest: Effort normal and breath sounds normal.  Abdominal: There is no tenderness.  Musculoskeletal: He exhibits tenderness.  Neurological: He is alert and oriented to person, place, and time. He has normal reflexes.  Skin: Skin is warm and dry.  Psychiatric: His behavior is normal. Thought content normal.  Vitals reviewed.     Assessment & Plan:   Jacob Rios was seen today for back pain.  Diagnoses and all orders for this visit:  Lumbar back pain  Other  orders -     Cancel: CBC with Differential/Platelet -     Cancel: CMP14+EGFR -     Cancel: Lipid panel -     Cancel: TSH -     Cancel: glipiZIDE (GLUCOTROL XL) 10 MG 24 hr tablet; Take 1 tablet (10 mg total) by mouth daily. -     Cancel: metFORMIN (GLUCOPHAGE) 1000 MG tablet; Take 1 tablet (1,000 mg total) by mouth 2 (two) times daily with a meal. -     predniSONE (STERAPRED UNI-PAK 48 TAB) 10 MG (48) TBPK tablet; Take as directed -     cyclobenzaprine (FLEXERIL) 10 MG tablet; Take 1 tablet (10 mg total) by mouth at bedtime.       I am having Jacob Rios start on predniSONE and cyclobenzaprine.  I am also having him maintain his Fish Oil, vitamin C, onetouch ultrasoft, glucose blood, aspirin, lisinopril, atorvastatin, metoprolol succinate, metFORMIN, glipiZIDE, and omeprazole.  Allergies as of 11/04/2016      Reactions   Morphine Swelling, Other (See Comments)   PT DOESNT REMEMBER /       Medication List       Accurate as of 11/04/16  5:48 PM. Always use your most recent med list.          aspirin 81 MG tablet Take 81 mg by mouth daily.   atorvastatin 40 MG tablet Commonly known as:  LIPITOR Take 1 tablet (40 mg total) by mouth daily.   cyclobenzaprine 10 MG tablet Commonly known as:  FLEXERIL Take 1 tablet (10 mg total) by mouth at bedtime.   Fish Oil 1000 MG Caps Take 1 capsule by mouth daily.   glipiZIDE 10 MG 24 hr tablet Commonly known as:  GLUCOTROL XL Take 1 tablet (10 mg total) by mouth daily.   glucose blood test strip Commonly known as:  ONETOUCH VERIO Test 1X per day and as needed  Dx 250.02   lisinopril 2.5 MG tablet Commonly known as:  PRINIVIL,ZESTRIL Take 1 tablet (2.5 mg total) by mouth daily.   metFORMIN 1000 MG tablet Commonly known as:  GLUCOPHAGE Take 1 tablet (1,000 mg total) by mouth 2 (two) times daily with a meal.   metoprolol succinate 50 MG 24 hr tablet Commonly known as:  TOPROL-XL Take 1 tablet (50 mg total) by mouth daily.   omeprazole 40 MG capsule Commonly known as:  PRILOSEC Take 1 capsule (40 mg total) by mouth daily.   onetouch ultrasoft lancets Patient test 1X per day and prn  Dx 250.02   predniSONE 10 MG (48) Tbpk tablet Commonly known as:  STERAPRED UNI-PAK 48 TAB Take as directed   vitamin C 500 MG tablet Commonly known as:  ASCORBIC ACID Take 500 mg by mouth daily.        Follow-up: Return in about 2 weeks (around 11/18/2016) for With his primary, Dr. Wendi Snipes.  Claretta Fraise, M.D.

## 2016-11-04 NOTE — Patient Instructions (Signed)

## 2016-11-10 ENCOUNTER — Telehealth: Payer: Self-pay | Admitting: Family Medicine

## 2016-11-10 NOTE — Telephone Encounter (Signed)
Patient states that the prednisone is making him sick on his stomach each time he takes it. States that his back pain is gone. Has been taking it as directed and been taking with food and it is not helping. Please advise and send back to the pools.

## 2016-11-10 NOTE — Telephone Encounter (Signed)
Please have the pt. DC the prednisone. WS

## 2016-11-11 NOTE — Telephone Encounter (Signed)
Informed to discontinue Prednisone per Dr. Livia Snellen

## 2016-11-20 ENCOUNTER — Ambulatory Visit: Payer: Medicare Other | Admitting: Family Medicine

## 2016-12-18 ENCOUNTER — Ambulatory Visit: Payer: Medicare Other | Admitting: Family Medicine

## 2016-12-18 ENCOUNTER — Encounter: Payer: Self-pay | Admitting: Family Medicine

## 2016-12-18 VITALS — BP 140/77 | HR 71 | Temp 97.5°F | Ht 71.5 in | Wt 247.4 lb

## 2016-12-18 DIAGNOSIS — C61 Malignant neoplasm of prostate: Secondary | ICD-10-CM

## 2016-12-18 DIAGNOSIS — N183 Chronic kidney disease, stage 3 (moderate): Secondary | ICD-10-CM

## 2016-12-18 DIAGNOSIS — E119 Type 2 diabetes mellitus without complications: Secondary | ICD-10-CM | POA: Diagnosis not present

## 2016-12-18 DIAGNOSIS — E1122 Type 2 diabetes mellitus with diabetic chronic kidney disease: Secondary | ICD-10-CM | POA: Diagnosis not present

## 2016-12-18 LAB — BAYER DCA HB A1C WAIVED: HB A1C: 9.1 % — AB (ref <7.0)

## 2016-12-18 MED ORDER — OMEPRAZOLE 40 MG PO CPDR: 40.0000 mg | DELAYED_RELEASE_CAPSULE | Freq: Every day | ORAL | 3 refills | Status: DC

## 2016-12-18 MED ORDER — METFORMIN HCL 1000 MG PO TABS: 1000.0000 mg | ORAL_TABLET | Freq: Two times a day (BID) | ORAL | 3 refills | Status: DC

## 2016-12-18 MED ORDER — GLIPIZIDE ER 10 MG PO TB24: 10.0000 mg | ORAL_TABLET | Freq: Every day | ORAL | 3 refills | Status: DC

## 2016-12-18 NOTE — Progress Notes (Signed)
   HPI  Patient presents today here for follow-up chronic medical conditions.  Type 2 diabetes. Good medication compliance, no hypoglycemia, needs refills. Patient took a steroid Dosepak during the interim since he saw me last.  He had good resolution of left-sided low back pain.  Prostate cancer Needs PSA faxed to his urologist, he is receiving treatment from them.  Chronic kidney disease Reviewed with patient  PMH: Smoking status noted ROS: Per HPI  Objective: BP 140/77   Pulse 71   Temp (!) 97.5 F (36.4 C) (Oral)   Ht 5' 11.5" (1.816 m)   Wt 247 lb 6.4 oz (112.2 kg)   BMI 34.02 kg/m  Gen: NAD, alert, cooperative with exam HEENT: NCAT CV: RRR, good S1/S2, no murmur Resp: CTABL, no wheezes, non-labored Ext: No edema, warm Neuro: Alert and oriented, No gross deficits  Assessment and plan:  #Type 2 diabetes Expect good control, A1c pending. CMP, monitoring renal function closely given the chronic kidney disease and metformin use  #CKD Repeat labs, reviewed with patient  #Prostate cancer Managed primarily by urology, PSA drawn today which will be faxed to them    Orders Placed This Encounter  Procedures  . Bayer DCA Hb A1c Waived  . CMP14+EGFR  . PSA    Meds ordered this encounter  Medications  . glipiZIDE (GLUCOTROL XL) 10 MG 24 hr tablet    Sig: Take 1 tablet (10 mg total) by mouth daily.    Dispense:  90 tablet    Refill:  3  . metFORMIN (GLUCOPHAGE) 1000 MG tablet    Sig: Take 1 tablet (1,000 mg total) by mouth 2 (two) times daily with a meal.    Dispense:  180 tablet    Refill:  3  . omeprazole (PRILOSEC) 40 MG capsule    Sig: Take 1 capsule (40 mg total) by mouth daily.    Dispense:  90 capsule    Refill:  [place], MD [facility] 12/18/2016, 8:12 AM

## 2016-12-18 NOTE — Patient Instructions (Signed)
Great to see you!   

## 2016-12-19 LAB — CMP14+EGFR
A/G RATIO: 1.7 (ref 1.2–2.2)
ALBUMIN: 4.1 g/dL (ref 3.5–4.8)
ALT: 18 IU/L (ref 0–44)
AST: 17 IU/L (ref 0–40)
Alkaline Phosphatase: 80 IU/L (ref 39–117)
BILIRUBIN TOTAL: 0.4 mg/dL (ref 0.0–1.2)
BUN / CREAT RATIO: 9 — AB (ref 10–24)
BUN: 13 mg/dL (ref 8–27)
CALCIUM: 9.5 mg/dL (ref 8.6–10.2)
CHLORIDE: 101 mmol/L (ref 96–106)
CO2: 24 mmol/L (ref 20–29)
Creatinine, Ser: 1.37 mg/dL — ABNORMAL HIGH (ref 0.76–1.27)
GFR calc Af Amer: 60 mL/min/{1.73_m2} (ref >59)
GFR, EST NON AFRICAN AMERICAN: 52 mL/min/{1.73_m2} — AB (ref >59)
Globulin, Total: 2.4 g/dL (ref 1.5–4.5)
Glucose: 116 mg/dL — ABNORMAL HIGH (ref 65–99)
POTASSIUM: 4.5 mmol/L (ref 3.5–5.2)
Sodium: 139 mmol/L (ref 134–144)
Total Protein: 6.5 g/dL (ref 6.0–8.5)

## 2016-12-19 LAB — PSA: Prostate Specific Ag, Serum: 8.4 ng/mL — ABNORMAL HIGH (ref 0.0–4.0)

## 2016-12-24 ENCOUNTER — Telehealth: Payer: Self-pay | Admitting: Family Medicine

## 2016-12-24 NOTE — Telephone Encounter (Signed)
lmtcb

## 2016-12-26 ENCOUNTER — Ambulatory Visit: Payer: Medicare Other | Admitting: Urology

## 2016-12-26 DIAGNOSIS — C61 Malignant neoplasm of prostate: Secondary | ICD-10-CM | POA: Diagnosis not present

## 2016-12-26 DIAGNOSIS — R351 Nocturia: Secondary | ICD-10-CM | POA: Diagnosis not present

## 2016-12-26 DIAGNOSIS — N403 Nodular prostate with lower urinary tract symptoms: Secondary | ICD-10-CM | POA: Diagnosis not present

## 2016-12-31 ENCOUNTER — Ambulatory Visit: Payer: Medicare Other | Admitting: Nurse Practitioner

## 2016-12-31 ENCOUNTER — Telehealth: Payer: Self-pay | Admitting: Cardiovascular Disease

## 2016-12-31 DIAGNOSIS — I10 Essential (primary) hypertension: Secondary | ICD-10-CM

## 2016-12-31 DIAGNOSIS — E782 Mixed hyperlipidemia: Secondary | ICD-10-CM

## 2016-12-31 MED ORDER — LISINOPRIL 2.5 MG PO TABS: 2.5000 mg | ORAL_TABLET | Freq: Every day | ORAL | 0 refills | Status: DC

## 2016-12-31 MED ORDER — METOPROLOL SUCCINATE ER 50 MG PO TB24: 50.0000 mg | ORAL_TABLET | Freq: Every day | ORAL | 0 refills | Status: DC

## 2016-12-31 MED ORDER — ATORVASTATIN CALCIUM 40 MG PO TABS: 40.0000 mg | ORAL_TABLET | Freq: Every day | ORAL | 0 refills | Status: DC

## 2016-12-31 NOTE — Telephone Encounter (Signed)
Pt's medication was sent to pt's pharmacy as requested. Confirmation received.  °

## 2016-12-31 NOTE — Telephone Encounter (Signed)
New message       *STAT* If patient is at the pharmacy, call can be transferred to refill team.   1. Which medications need to be refilled? (please list name of each medication and dose if known) atorvastatin, lisinopril, metoprolol 2. Which pharmacy/location (including street and city if local pharmacy) is medication to be sent to? [facility] at [place] 3. Do they need a 30 day or 90 day supply?enough to last until next appt

## 2017-01-08 NOTE — Telephone Encounter (Signed)
Aware of lab results  

## 2017-01-22 ENCOUNTER — Ambulatory Visit: Payer: Medicare Other | Admitting: Cardiovascular Disease

## 2017-01-22 ENCOUNTER — Encounter: Payer: Self-pay | Admitting: Cardiovascular Disease

## 2017-01-22 VITALS — BP 136/70 | HR 62 | Ht 71.5 in | Wt 247.0 lb

## 2017-01-22 DIAGNOSIS — I10 Essential (primary) hypertension: Secondary | ICD-10-CM

## 2017-01-22 DIAGNOSIS — E782 Mixed hyperlipidemia: Secondary | ICD-10-CM | POA: Diagnosis not present

## 2017-01-22 DIAGNOSIS — I2510 Atherosclerotic heart disease of native coronary artery without angina pectoris: Secondary | ICD-10-CM | POA: Diagnosis not present

## 2017-01-22 MED ORDER — METOPROLOL SUCCINATE ER 50 MG PO TB24: 50.0000 mg | ORAL_TABLET | Freq: Every day | ORAL | 3 refills | Status: DC

## 2017-01-22 MED ORDER — LISINOPRIL 2.5 MG PO TABS: 2.5000 mg | ORAL_TABLET | Freq: Every day | ORAL | 3 refills | Status: DC

## 2017-01-22 MED ORDER — ATORVASTATIN CALCIUM 40 MG PO TABS: 40.0000 mg | ORAL_TABLET | Freq: Every day | ORAL | 3 refills | Status: DC

## 2017-01-22 NOTE — Progress Notes (Signed)
Chief Complaint  Patient presents with  . Coronary Artery Disease     History of Present Illness: 72 yo male with history of CAD s/p CABG 2000, HTN, HLD, DM and former tobacco abuse here today for cardiac follow up. In 2000 he had an MI with subsequent bypass surgery (last cath 2005 with occluded LIMA to LAD-native LAD open, patent SVG to diagonal and patent RIMA to RCA). He quit smoking in 1999. He has now fully retired. He was a Emergency planning/management officer for 28 years. Stress myoview 12/01/12 with no ischemia.   He is here today for follow up. The patient denies any chest pain, dyspnea, palpitations, lower extremity edema, orthopnea, PND, dizziness, near syncope or syncope.   Primary Care Physician: Timmothy Euler, MD  Past Medical History:  Diagnosis Date  . Coronary artery disease    3V CABG 2000. Last heart cath 2005, with 2 patent grafts  . Diabetes mellitus   . Hyperlipidemia   . Hypertension     Past Surgical History:  Procedure Laterality Date  . APPENDECTOMY    . CARDIAC CATHETERIZATION  2005  . CORONARY ARTERY BYPASS GRAFT  2000   3 vessel  . HERNIA REPAIR      Current Outpatient Medications  Medication Sig Dispense Refill  . aspirin 81 MG tablet Take 81 mg by mouth daily.    Marland Kitchen atorvastatin (LIPITOR) 40 MG tablet Take 1 tablet (40 mg total) by mouth daily. Please keep upcoming appt for future refills. Thank you 30 tablet 0  . glipiZIDE (GLUCOTROL XL) 10 MG 24 hr tablet Take 1 tablet (10 mg total) by mouth daily. 90 tablet 3  . glucose blood (ONETOUCH VERIO) test strip Test 1X per day and as needed  Dx 250.02 100 each 12  . Lancets (ONETOUCH ULTRASOFT) lancets Patient test 1X per day and prn  Dx 250.02 100 each 12  . lisinopril (PRINIVIL,ZESTRIL) 2.5 MG tablet Take 1 tablet (2.5 mg total) by mouth daily. Please keep upcoming appt for future refills. Thank you 30 tablet 0  . metFORMIN (GLUCOPHAGE) 1000 MG tablet Take 1 tablet (1,000 mg total) by mouth 2 (two) times daily with  a meal. 180 tablet 3  . metoprolol succinate (TOPROL-XL) 50 MG 24 hr tablet Take 1 tablet (50 mg total) by mouth daily. Please keep upcoming appt for future refills. Thank you 30 tablet 0  . Omega-3 Fatty Acids (FISH OIL) 1000 MG CAPS Take 1 capsule by mouth daily.     Marland Kitchen omeprazole (PRILOSEC) 40 MG capsule Take 1 capsule (40 mg total) by mouth daily. 90 capsule 3  . vitamin C (ASCORBIC ACID) 500 MG tablet Take 500 mg by mouth daily.       No current facility-administered medications for this visit.     Allergies  Allergen Reactions  . Morphine Swelling and Other (See Comments)    PT DOESNT REMEMBER /     Social History   Socioeconomic History  . Marital status: Married    Spouse name: Not on file  . Number of children: Not on file  . Years of education: Not on file  . Highest education level: Not on file  Social Needs  . Financial resource strain: Not on file  . Food insecurity - worry: Not on file  . Food insecurity - inability: Not on file  . Transportation needs - medical: Not on file  . Transportation needs - non-medical: Not on file  Occupational History  . Occupation: part-time  trucker  Tobacco Use  . Smoking status: Former Smoker    Last attempt to quit: 01/13/1997    Years since quitting: 20.0  . Smokeless tobacco: Never Used  Substance and Sexual Activity  . Alcohol use: No  . Drug use: No  . Sexual activity: Not on file  Other Topics Concern  . Not on file  Social History Narrative  . Not on file    Family History  Problem Relation Age of Onset  . Lung cancer Mother   . Cancer Mother   . Dementia Father   . Hyperlipidemia Father   . Heart attack Neg Hx   . Stroke Neg Hx     Review of Systems:  As stated in the HPI and otherwise negative.   BP 136/70   Pulse 62   Ht 5' 11.5" (1.816 m)   Wt 247 lb (112 kg)   SpO2 95%   BMI 33.97 kg/m   Physical Examination:  General: Well developed, well nourished, NAD  HEENT: OP clear, mucus membranes moist    SKIN: warm, dry. No rashes. Neuro: No focal deficits  Musculoskeletal: Muscle strength 5/5 all ext  Psychiatric: Mood and affect normal  Neck: No JVD, no carotid bruits, no thyromegaly, no lymphadenopathy.  Lungs:Clear bilaterally, no wheezes, rhonci, crackles Cardiovascular: Regular rate and rhythm. No murmurs, gallops or rubs. Abdomen:Soft. Bowel sounds present. Non-tender.  Extremities: No lower extremity edema. Pulses are 2 + in the bilateral DP/PT.  EKG:  EKG is ordered today. The ekg ordered today demonstrates NSR, rate 62 bpm.   Recent Labs: 12/18/2016: ALT 18; BUN 13; Creatinine, Ser 1.37; Potassium 4.5; Sodium 139   Lipid Panel    Component Value Date/Time   CHOL 123 05/14/2016 0825   CHOL 107 07/30/2012 1158   TRIG 135 05/14/2016 0825   TRIG 96 07/30/2012 1158   HDL 28 (L) 05/14/2016 0825   HDL 24 (L) 07/30/2012 1158   CHOLHDL 4.4 05/14/2016 0825   LDLCALC 68 05/14/2016 0825   LDLCALC 64 07/30/2012 1158     Wt Readings from Last 3 Encounters:  01/22/17 247 lb (112 kg)  12/18/16 247 lb 6.4 oz (112.2 kg)  11/04/16 244 lb (110.7 kg)     Other studies Reviewed: Additional studies/ records that were reviewed today include: . Review of the above records demonstrates:    Assessment and Plan:   1. CAD without angina: He is having no chest pain suggestive of angina. Will continue ASA, statin and beta blocker.   2. HTN: BP is controlled. Continue current medications.   3. Hyperlipidemia:  Lipids well controlled May 2018. Will continue statin. Repeat lipids may 2019 in primary care.   Current medicines are reviewed at length with the patient today.  The patient does not have concerns regarding medicines.  The following changes have been made:  no change  Labs/ tests ordered today include:   No orders of the defined types were placed in this encounter.    Disposition:   FU with me in 12  months   Signed, Lauree Chandler, MD 01/22/2017 2:01 PM     [facility] [address], [place],   [address] Phone: [phone]; Fax: [phone]

## 2017-01-22 NOTE — Patient Instructions (Signed)

## 2017-01-22 NOTE — Addendum Note (Signed)
Addended by: Thompson Grayer on: 01/22/2017 02:10 PM   Modules accepted: Orders

## 2017-02-03 ENCOUNTER — Other Ambulatory Visit: Payer: Self-pay | Admitting: Urology

## 2017-02-03 DIAGNOSIS — R972 Elevated prostate specific antigen [PSA]: Secondary | ICD-10-CM

## 2017-02-03 DIAGNOSIS — C61 Malignant neoplasm of prostate: Secondary | ICD-10-CM

## 2017-02-12 ENCOUNTER — Ambulatory Visit
Admission: RE | Admit: 2017-02-12 | Discharge: 2017-02-12 | Disposition: A | Discharge status: Home / Self Care | Payer: Medicare Other | Source: Ambulatory Visit | Attending: Urology | Admitting: Urology

## 2017-02-12 ENCOUNTER — Other Ambulatory Visit: Payer: Self-pay | Admitting: Urology

## 2017-02-12 DIAGNOSIS — R972 Elevated prostate specific antigen [PSA]: Secondary | ICD-10-CM

## 2017-02-12 DIAGNOSIS — Z77018 Contact with and (suspected) exposure to other hazardous metals: Secondary | ICD-10-CM

## 2017-02-12 DIAGNOSIS — C61 Malignant neoplasm of prostate: Secondary | ICD-10-CM

## 2017-02-12 MED ORDER — GADOBENATE DIMEGLUMINE 529 MG/ML IV SOLN
20.0000 mL | Freq: Once | INTRAVENOUS | Status: AC | PRN
  Administered 2017-02-12: 20 mL via INTRAVENOUS

## 2017-04-08 ENCOUNTER — Ambulatory Visit: Payer: Medicare Other | Admitting: Nurse Practitioner

## 2017-04-08 ENCOUNTER — Encounter: Payer: Self-pay | Admitting: Nurse Practitioner

## 2017-04-08 VITALS — BP 124/72 | HR 69 | Temp 97.3°F | Ht 71.0 in | Wt 244.0 lb

## 2017-04-08 DIAGNOSIS — R05 Cough: Secondary | ICD-10-CM

## 2017-04-08 DIAGNOSIS — R059 Cough, unspecified: Secondary | ICD-10-CM

## 2017-04-08 MED ORDER — AZITHROMYCIN 250 MG PO TABS: ORAL_TABLET | ORAL | 0 refills | Status: DC

## 2017-04-08 MED ORDER — HYDROCODONE-HOMATROPINE 5-1.5 MG/5ML PO SYRP: 5.0000 mL | ORAL_SOLUTION | Freq: Four times a day (QID) | ORAL | 0 refills | Status: DC | PRN

## 2017-04-08 NOTE — Patient Instructions (Signed)

## 2017-04-08 NOTE — Progress Notes (Signed)
Subjective:    Patient ID: Jacob Rios, male    DOB: 01/02/1946, 72 y.o.   MRN: [id number]  HPI Patient comes in today accompanied by wife. He is c/o cough and chest congestion. Has been coughing up a lot of phlegm. OTC cough meds not working well.    Review of Systems  Constitutional: Positive for chills. Negative for fever.  HENT: Positive for congestion, postnasal drip and rhinorrhea. Negative for ear pain, sinus pressure, sinus pain, sore throat and trouble swallowing.   Respiratory: Positive for cough.   Cardiovascular: Negative for chest pain, palpitations and leg swelling.  Gastrointestinal: Negative.   Genitourinary: Negative.   Neurological: Negative.   Psychiatric/Behavioral: Negative.   All other systems reviewed and are negative.      Objective:   Physical Exam  Constitutional: He is oriented to person, place, and time. He appears well-developed and well-nourished.  HENT:  Head: Normocephalic.  Right Ear: Hearing, tympanic membrane, external ear and ear canal normal.  Left Ear: Hearing, tympanic membrane, external ear and ear canal normal.  Nose: Mucosal edema and rhinorrhea present. Right sinus exhibits no maxillary sinus tenderness and no frontal sinus tenderness. Left sinus exhibits no maxillary sinus tenderness and no frontal sinus tenderness.  Mouth/Throat: Uvula is midline and oropharynx is clear and moist.  Eyes: Pupils are equal, round, and reactive to light. EOM are normal.  Neck: Normal range of motion. Neck supple. No JVD present. No thyromegaly present.  Cardiovascular: Normal rate, regular rhythm, normal heart sounds and intact distal pulses. Exam reveals no gallop and no friction rub.  No murmur heard. Pulmonary/Chest: Effort normal and breath sounds normal. No respiratory distress. He has no wheezes. He has no rales. He exhibits no tenderness.  Deep dry cough  Abdominal: Soft. Bowel sounds are normal.  Genitourinary: Prostate normal and penis  normal.  Musculoskeletal: Normal range of motion. He exhibits no edema.  Lymphadenopathy:    He has no cervical adenopathy.  Neurological: He is alert and oriented to person, place, and time. No cranial nerve deficit.  Skin: Skin is warm and dry.  Psychiatric: He has a normal mood and affect. His behavior is normal. Judgment and thought content normal.   BP 124/72   Pulse 69   Temp (!) 97.3 F (36.3 C) (Oral)   Ht 5\' 11"  (1.803 m)   Wt 244 lb (110.7 kg)   BMI 34.03 kg/m       Assessment & Plan:   1. Cough    Meds ordered this encounter  Medications  . azithromycin (ZITHROMAX Z-PAK) 250 MG tablet    Sig: As directed    Dispense:  6 tablet    Refill:  0    Order Specific Question:   Supervising Provider    Answer:   VINCENT, CAROL L [[id number]]  . HYDROcodone-homatropine (HYCODAN) 5-1.5 MG/5ML syrup    Sig: Take 5 mLs by mouth every 6 (six) hours as needed for cough.    Dispense:  120 mL    Refill:  0    Order Specific Question:   Supervising Provider    Answer:   VINCENT, CAROL L [[id number]]   1. Take meds as prescribed 2. Use a cool mist humidifier especially during the winter months and when heat has been humid. 3. Use saline nose sprays frequently 4. Saline irrigations of the nose can be very helpful if done frequently.  * 4X daily for 1 week*  * Use of a nettie pot can be  helpful with this. Follow directions with this* 5. Drink plenty of fluids 6. Keep thermostat turn down low 7.For any cough or congestion  Use plain Mucinex- regular strength or max strength is fine   * Children- consult with Pharmacist for dosing 8. For fever or aces or pains- take tylenol or ibuprofen appropriate for age and weight.  * for fevers greater than 101 orally you may alternate ibuprofen and tylenol every  3 hours.   [name], FNP

## 2017-04-09 ENCOUNTER — Ambulatory Visit: Payer: Medicare Other | Admitting: Nurse Practitioner

## 2017-04-20 ENCOUNTER — Encounter: Payer: Self-pay | Admitting: Family Medicine

## 2017-04-20 ENCOUNTER — Ambulatory Visit: Payer: Medicare Other | Admitting: Family Medicine

## 2017-04-20 VITALS — BP 125/75 | HR 84 | Temp 98.4°F | Ht 71.0 in | Wt 242.8 lb

## 2017-04-20 DIAGNOSIS — Z1211 Encounter for screening for malignant neoplasm of colon: Secondary | ICD-10-CM

## 2017-04-20 DIAGNOSIS — E119 Type 2 diabetes mellitus without complications: Secondary | ICD-10-CM | POA: Diagnosis not present

## 2017-04-20 DIAGNOSIS — K219 Gastro-esophageal reflux disease without esophagitis: Secondary | ICD-10-CM | POA: Diagnosis not present

## 2017-04-20 DIAGNOSIS — E782 Mixed hyperlipidemia: Secondary | ICD-10-CM | POA: Diagnosis not present

## 2017-04-20 DIAGNOSIS — Z1212 Encounter for screening for malignant neoplasm of rectum: Secondary | ICD-10-CM

## 2017-04-20 LAB — BAYER DCA HB A1C WAIVED: HB A1C (BAYER DCA - WAIVED): 6.6 % (ref <7.0)

## 2017-04-20 NOTE — Patient Instructions (Signed)
Great to see you!   

## 2017-04-20 NOTE — Progress Notes (Signed)
   HPI  Patient presents today follow-up chronic medical conditions.  Type 2 diabetes Good medication compliance, average fasting blood sugar 110-120. No hypoglycemia. Diet has improved since last visit.  Hyperlipidemia Good medication compliance, watching diet moderately.  Patient would like to do the cologuard  Biopsy of his prostate, prostate cancer overall has not advanced much according to urology.  Prilosec is working well for GERD  PMH: Smoking status noted ROS: Per HPI  Objective: BP 125/75   Pulse 84   Temp 98.4 F (36.9 C) (Oral)   Ht 5\' 11"  (1.803 m)   Wt 242 lb 12.8 oz (110.1 kg)   BMI 33.86 kg/m  Gen: NAD, alert, cooperative with exam HEENT: NCAT CV: RRR, good S1/S2, no murmur Resp: CTABL, no wheezes, non-labored Ext: No edema, warm Neuro: Alert and oriented, No gross deficits  Assessment and plan:  #Type 2 diabetes Expect good control, however last A1c was 9.1. Continue current medications for now, consider adding Januvia if needed  #Hyperlipidemia Labs today Continue Lipitor  # GERD Well-controlled on Prilosec No changes  # Screening  for colon cancer [place], MD [facility] 04/20/2017, 8:13 AM

## 2017-04-21 LAB — CBC WITH DIFFERENTIAL/PLATELET
BASOS: 1 %
Basophils Absolute: 0 10*3/uL (ref 0.0–0.2)
EOS (ABSOLUTE): 0.3 10*3/uL (ref 0.0–0.4)
Eos: 6 %
Hematocrit: 39.8 % (ref 37.5–51.0)
Hemoglobin: 13.2 g/dL (ref 13.0–17.7)
Immature Grans (Abs): 0 10*3/uL (ref 0.0–0.1)
Immature Granulocytes: 0 %
Lymphocytes Absolute: 1.2 10*3/uL (ref 0.7–3.1)
Lymphs: 23 %
MCH: 27.8 pg (ref 26.6–33.0)
MCHC: 33.2 g/dL (ref 31.5–35.7)
MCV: 84 fL (ref 79–97)
MONOS ABS: 0.4 10*3/uL (ref 0.1–0.9)
Monocytes: 8 %
NEUTROS ABS: 3.2 10*3/uL (ref 1.4–7.0)
Neutrophils: 62 %
PLATELETS: 167 10*3/uL (ref 150–379)
RBC: 4.75 x10E6/uL (ref 4.14–5.80)
RDW: 15.5 % — AB (ref 12.3–15.4)
WBC: 5.1 10*3/uL (ref 3.4–10.8)

## 2017-04-21 LAB — MICROALBUMIN / CREATININE URINE RATIO
Creatinine, Urine: 125.1 mg/dL
Microalb/Creat Ratio: 12.3 mg/g creat (ref 0.0–30.0)
Microalbumin, Urine: 15.4 ug/mL

## 2017-04-21 LAB — CMP14+EGFR
A/G RATIO: 1.9 (ref 1.2–2.2)
ALT: 15 IU/L (ref 0–44)
AST: 14 IU/L (ref 0–40)
Albumin: 4.1 g/dL (ref 3.5–4.8)
Alkaline Phosphatase: 72 IU/L (ref 39–117)
BILIRUBIN TOTAL: 0.4 mg/dL (ref 0.0–1.2)
BUN/Creatinine Ratio: 11 (ref 10–24)
BUN: 16 mg/dL (ref 8–27)
CALCIUM: 9.3 mg/dL (ref 8.6–10.2)
CO2: 24 mmol/L (ref 20–29)
Chloride: 102 mmol/L (ref 96–106)
Creatinine, Ser: 1.43 mg/dL — ABNORMAL HIGH (ref 0.76–1.27)
GFR, EST AFRICAN AMERICAN: 57 mL/min/{1.73_m2} — AB (ref >59)
GFR, EST NON AFRICAN AMERICAN: 49 mL/min/{1.73_m2} — AB (ref >59)
GLOBULIN, TOTAL: 2.2 g/dL (ref 1.5–4.5)
Glucose: 99 mg/dL (ref 65–99)
POTASSIUM: 4.8 mmol/L (ref 3.5–5.2)
SODIUM: 142 mmol/L (ref 134–144)
TOTAL PROTEIN: 6.3 g/dL (ref 6.0–8.5)

## 2017-04-21 LAB — LIPID PANEL
Chol/HDL Ratio: 3.8 ratio (ref 0.0–5.0)
Cholesterol, Total: 114 mg/dL (ref 100–199)
HDL: 30 mg/dL — ABNORMAL LOW (ref >39)
LDL Calculated: 64 mg/dL (ref 0–99)
Triglycerides: 98 mg/dL (ref 0–149)
VLDL Cholesterol Cal: 20 mg/dL (ref 5–40)

## 2017-04-21 LAB — TSH: TSH: 1.26 u[IU]/mL (ref 0.450–4.500)

## 2017-05-04 NOTE — Addendum Note (Signed)
Addended by: Ilean China on: 05/04/2017 03:51 PM   Modules accepted: Orders

## 2017-06-01 LAB — COLOGUARD: COLOGUARD: POSITIVE

## 2017-06-13 HISTORY — PX: PROSTATE BIOPSY: SHX241

## 2017-06-15 DIAGNOSIS — Z1211 Encounter for screening for malignant neoplasm of colon: Secondary | ICD-10-CM

## 2017-06-22 ENCOUNTER — Encounter: Payer: Self-pay | Admitting: Internal Medicine

## 2017-06-22 ENCOUNTER — Telehealth: Payer: Self-pay | Admitting: Family Medicine

## 2017-06-22 NOTE — Telephone Encounter (Signed)
Patient aware of results and needed number to call GI back- number given.

## 2017-08-24 ENCOUNTER — Ambulatory Visit: Payer: Medicare Other | Admitting: Internal Medicine

## 2017-08-24 ENCOUNTER — Encounter: Payer: Self-pay | Admitting: Internal Medicine

## 2017-08-24 ENCOUNTER — Encounter (INDEPENDENT_AMBULATORY_CARE_PROVIDER_SITE_OTHER): Payer: Self-pay

## 2017-08-24 VITALS — BP 140/80 | HR 54 | Ht 71.0 in | Wt 242.0 lb

## 2017-08-24 DIAGNOSIS — R195 Other fecal abnormalities: Secondary | ICD-10-CM

## 2017-08-24 DIAGNOSIS — K219 Gastro-esophageal reflux disease without esophagitis: Secondary | ICD-10-CM

## 2017-08-24 MED ORDER — NA SULFATE-K SULFATE-MG SULF 17.5-3.13-1.6 GM/177ML PO SOLN: 1.0000 | Freq: Once | ORAL | 0 refills | Status: AC

## 2017-08-24 NOTE — Progress Notes (Signed)
HISTORY OF PRESENT ILLNESS:  Jacob Rios is a 72 y.o. male , retired Emergency planning/management officer, with coronary artery disease, hypertension, hyperlipidemia, and type 2 diabetes mellitus status post prior CABG who is sent today by his primary care provider Dr. Wendi Snipes regarding positive Cologuard test. I saw the patient remotely January 2000 when he underwent upper endoscopy to evaluate epigastric pain. Examination was unremarkable. Patient has not had prior colonoscopy. Review of outside laboratories finds positive Cologard test 06/01/2017. Review of additional laboratory data from April 2019 finds a unremarkable CBC with hemoglobin 13.2. He is accompanied today by his wife. GI review of systems is remarkable for GERD without dysphagia for which he takes omeprazole 40 mg daily with good control of symptoms. The patient mentions that occasionally he will see some rectal bleeding which is attributed to hemorrhoids but this is rare. He also tells me that he underwent prostate biopsy some point shortly prior to submitting his Cologu ard sample. No family history of colon cancer. He reports good control of his diabetes  REVIEW OF SYSTEMS:  All non-GI ROS negative unless otherwise stated in the history of present illness except for arthritis, sinus and allergy, hearing problems, excessive urination  Past Medical History:  Diagnosis Date  . Coronary artery disease    3V CABG 2000. Last heart cath 2005, with 2 patent grafts  . Diabetes mellitus   . Hyperlipidemia   . Hypertension     Past Surgical History:  Procedure Laterality Date  . APPENDECTOMY    . CARDIAC CATHETERIZATION  2005  . CORONARY ARTERY BYPASS GRAFT  2000   3 vessel  . HERNIA REPAIR    . PROSTATE BIOPSY  06/2017    Social History Jacob Rios  reports that he quit smoking about 20 years ago. He has never used smokeless tobacco. He reports that he does not drink alcohol or use drugs.  family history includes Cancer in his mother;  Dementia in his father; Hyperlipidemia in his father; Lung cancer in his mother.  Allergies  Allergen Reactions  . Morphine Swelling and Other (See Comments)    PT DOESNT REMEMBER /        PHYSICAL EXAMINATION: Vital signs: BP 140/80   Pulse (!) 54   Ht 5\' 11"  (1.803 m)   Wt 242 lb (109.8 kg)   BMI 33.75 kg/m   Constitutional:pleasant, generally well-appearing, no acute distress Psychiatric: alert and oriented x3, cooperative Eyes: extraocular movements intact, anicteric, conjunctiva pink Mouth: oral pharynx moist, no lesions Neck: supple no lymphadenopathy Cardiovascular: heart regular rate and rhythm, no murmur Lungs: clear to auscultation bilaterally Abdomen: soft, obese, nontender, nondistended, no obvious ascites, no peritoneal signs, normal bowel sounds, no organomegaly Rectal:deferred until colonoscopy Extremities: no clubbing, cyanosis, or lower extremity edema bilaterally Skin: no lesions on visible extremities Neuro: No focal deficits. Cranial nerves intact  ASSESSMENT:  #1. Positive cologuard test. Rule out colorectal neoplasia. I discussed with patient implications of positive Cologuard testing and the rationale behind colonoscopy #2. GERD without alarm features. Request PPI for control #3. multiple medical problems. Stable   PLAN:  #1. Diagnostic colonoscopy.The nature of the procedure, as well as the risks, benefits, and alternatives were carefully and thoroughly reviewed with the patient. Ample time for discussion and questions allowed. The patient understood, was satisfied, and agreed to proceed.. #2. Hold diabetic medications the day of the procedure to avoid and wanted hypoglycemia #3. Reflux precautions #4. Continue PPI   A copy of this consultation note has  been sent to Dr. Wendi Snipes

## 2017-08-24 NOTE — Patient Instructions (Signed)

## 2017-08-27 ENCOUNTER — Ambulatory Visit: Payer: Medicare Other | Admitting: Nurse Practitioner

## 2017-08-27 ENCOUNTER — Encounter: Payer: Self-pay | Admitting: Nurse Practitioner

## 2017-08-27 VITALS — BP 137/81 | HR 65 | Temp 97.2°F

## 2017-08-27 DIAGNOSIS — K219 Gastro-esophageal reflux disease without esophagitis: Secondary | ICD-10-CM | POA: Diagnosis not present

## 2017-08-27 DIAGNOSIS — E119 Type 2 diabetes mellitus without complications: Secondary | ICD-10-CM | POA: Diagnosis not present

## 2017-08-27 DIAGNOSIS — N183 Chronic kidney disease, stage 3 unspecified: Secondary | ICD-10-CM

## 2017-08-27 DIAGNOSIS — E785 Hyperlipidemia, unspecified: Secondary | ICD-10-CM | POA: Diagnosis not present

## 2017-08-27 DIAGNOSIS — I10 Essential (primary) hypertension: Secondary | ICD-10-CM | POA: Diagnosis not present

## 2017-08-27 DIAGNOSIS — Z6833 Body mass index (BMI) 33.0-33.9, adult: Secondary | ICD-10-CM

## 2017-08-27 DIAGNOSIS — E782 Mixed hyperlipidemia: Secondary | ICD-10-CM

## 2017-08-27 DIAGNOSIS — I25810 Atherosclerosis of coronary artery bypass graft(s) without angina pectoris: Secondary | ICD-10-CM

## 2017-08-27 DIAGNOSIS — E1122 Type 2 diabetes mellitus with diabetic chronic kidney disease: Secondary | ICD-10-CM

## 2017-08-27 LAB — CMP14+EGFR
ALBUMIN: 4.2 g/dL (ref 3.5–4.8)
ALT: 15 IU/L (ref 0–44)
AST: 15 IU/L (ref 0–40)
Albumin/Globulin Ratio: 1.5 (ref 1.2–2.2)
Alkaline Phosphatase: 86 IU/L (ref 39–117)
BUN / CREAT RATIO: 12 (ref 10–24)
BUN: 19 mg/dL (ref 8–27)
Bilirubin Total: 0.5 mg/dL (ref 0.0–1.2)
CALCIUM: 9.8 mg/dL (ref 8.6–10.2)
CO2: 24 mmol/L (ref 20–29)
CREATININE: 1.55 mg/dL — AB (ref 0.76–1.27)
Chloride: 101 mmol/L (ref 96–106)
GFR calc Af Amer: 52 mL/min/{1.73_m2} — ABNORMAL LOW (ref >59)
GFR, EST NON AFRICAN AMERICAN: 45 mL/min/{1.73_m2} — AB (ref >59)
GLOBULIN, TOTAL: 2.8 g/dL (ref 1.5–4.5)
GLUCOSE: 114 mg/dL — AB (ref 65–99)
Potassium: 4.9 mmol/L (ref 3.5–5.2)
SODIUM: 139 mmol/L (ref 134–144)
TOTAL PROTEIN: 7 g/dL (ref 6.0–8.5)

## 2017-08-27 LAB — LIPID PANEL
CHOL/HDL RATIO: 4.1 ratio (ref 0.0–5.0)
CHOLESTEROL TOTAL: 126 mg/dL (ref 100–199)
HDL: 31 mg/dL — ABNORMAL LOW (ref >39)
LDL CALC: 73 mg/dL (ref 0–99)
Triglycerides: 110 mg/dL (ref 0–149)
VLDL CHOLESTEROL CAL: 22 mg/dL (ref 5–40)

## 2017-08-27 LAB — BAYER DCA HB A1C WAIVED: HB A1C (BAYER DCA - WAIVED): 6.6 % (ref <7.0)

## 2017-08-27 MED ORDER — LISINOPRIL 2.5 MG PO TABS: 2.5000 mg | ORAL_TABLET | Freq: Every day | ORAL | 3 refills | Status: DC

## 2017-08-27 MED ORDER — METFORMIN HCL 1000 MG PO TABS: 1000.0000 mg | ORAL_TABLET | Freq: Two times a day (BID) | ORAL | 3 refills | Status: DC

## 2017-08-27 MED ORDER — METOPROLOL SUCCINATE ER 50 MG PO TB24: 50.0000 mg | ORAL_TABLET | Freq: Every day | ORAL | 3 refills | Status: DC

## 2017-08-27 MED ORDER — ATORVASTATIN CALCIUM 40 MG PO TABS: 40.0000 mg | ORAL_TABLET | Freq: Every day | ORAL | 3 refills | Status: DC

## 2017-08-27 MED ORDER — GLIPIZIDE ER 10 MG PO TB24: 10.0000 mg | ORAL_TABLET | Freq: Every day | ORAL | 3 refills | Status: DC

## 2017-08-27 MED ORDER — OMEPRAZOLE 40 MG PO CPDR: 40.0000 mg | DELAYED_RELEASE_CAPSULE | Freq: Every day | ORAL | 3 refills | Status: DC

## 2017-08-27 NOTE — Patient Instructions (Signed)
DASH Eating Plan DASH stands for "Dietary Approaches to Stop Hypertension." The DASH eating plan is a healthy eating plan that has been shown to reduce high blood pressure (hypertension). It may also reduce your risk for type 2 diabetes, heart disease, and stroke. The DASH eating plan may also help with weight loss. What are tips for following this plan? General guidelines  Avoid eating more than 2,300 mg (milligrams) of salt (sodium) a day. If you have hypertension, you may need to reduce your sodium intake to 1,500 mg a day.  Limit alcohol intake to no more than 1 drink a day for nonpregnant women and 2 drinks a day for men. One drink equals 12 oz of beer, 5 oz of wine, or 1 oz of hard liquor.  Work with your health care provider to maintain a healthy body weight or to lose weight. Ask what an ideal weight is for you.  Get at least 30 minutes of exercise that causes your heart to beat faster (aerobic exercise) most days of the week. Activities may include walking, swimming, or biking.  Work with your health care provider or diet and nutrition specialist (dietitian) to adjust your eating plan to your individual calorie needs. Reading food labels  Check food labels for the amount of sodium per serving. Choose foods with less than 5 percent of the Daily Value of sodium. Generally, foods with less than 300 mg of sodium per serving fit into this eating plan.  To find whole grains, look for the word "whole" as the first word in the ingredient list. Shopping  Buy products labeled as "low-sodium" or "no salt added."  Buy fresh foods. Avoid canned foods and premade or frozen meals. Cooking  Avoid adding salt when cooking. Use salt-free seasonings or herbs instead of table salt or sea salt. Check with your health care provider or pharmacist before using salt substitutes.  Do not fry foods. Cook foods using healthy methods such as baking, boiling, grilling, and broiling instead.  Cook with  heart-healthy oils, such as olive, canola, soybean, or sunflower oil. Meal planning   Eat a balanced diet that includes: ? 5 or more servings of fruits and vegetables each day. At each meal, try to fill half of your plate with fruits and vegetables. ? Up to 6-8 servings of whole grains each day. ? Less than 6 oz of lean meat, poultry, or fish each day. A 3-oz serving of meat is about the same size as a deck of cards. One egg equals 1 oz. ? 2 servings of low-fat dairy each day. ? A serving of nuts, seeds, or beans 5 times each week. ? Heart-healthy fats. Healthy fats called Omega-3 fatty acids are found in foods such as flaxseeds and coldwater fish, like sardines, salmon, and mackerel.  Limit how much you eat of the following: ? Canned or prepackaged foods. ? Food that is high in trans fat, such as fried foods. ? Food that is high in saturated fat, such as fatty meat. ? Sweets, desserts, sugary drinks, and other foods with added sugar. ? Full-fat dairy products.  Do not salt foods before eating.  Try to eat at least 2 vegetarian meals each week.  Eat more home-cooked food and less restaurant, buffet, and fast food.  When eating at a restaurant, ask that your food be prepared with less salt or no salt, if possible. What foods are recommended? The items listed may not be a complete list. Talk with your dietitian about what   dietary choices are best for you. Grains Whole-grain or whole-wheat bread. Whole-grain or whole-wheat pasta. Brown rice. Oatmeal. Quinoa. Bulgur. Whole-grain and low-sodium cereals. Pita bread. Low-fat, low-sodium crackers. Whole-wheat flour tortillas. Vegetables Fresh or frozen vegetables (raw, steamed, roasted, or grilled). Low-sodium or reduced-sodium tomato and vegetable juice. Low-sodium or reduced-sodium tomato sauce and tomato paste. Low-sodium or reduced-sodium canned vegetables. Fruits All fresh, dried, or frozen fruit. Canned fruit in natural juice (without  added sugar). Meat and other protein foods Skinless chicken or turkey. Ground chicken or turkey. Pork with fat trimmed off. Fish and seafood. Egg whites. Dried beans, peas, or lentils. Unsalted nuts, nut butters, and seeds. Unsalted canned beans. Lean cuts of beef with fat trimmed off. Low-sodium, lean deli meat. Dairy Low-fat (1%) or fat-free (skim) milk. Fat-free, low-fat, or reduced-fat cheeses. Nonfat, low-sodium ricotta or cottage cheese. Low-fat or nonfat yogurt. Low-fat, low-sodium cheese. Fats and oils Soft margarine without trans fats. Vegetable oil. Low-fat, reduced-fat, or light mayonnaise and salad dressings (reduced-sodium). Canola, safflower, olive, soybean, and sunflower oils. Avocado. Seasoning and other foods Herbs. Spices. Seasoning mixes without salt. Unsalted popcorn and pretzels. Fat-free sweets. What foods are not recommended? The items listed may not be a complete list. Talk with your dietitian about what dietary choices are best for you. Grains Baked goods made with fat, such as croissants, muffins, or some breads. Dry pasta or rice meal packs. Vegetables Creamed or fried vegetables. Vegetables in a cheese sauce. Regular canned vegetables (not low-sodium or reduced-sodium). Regular canned tomato sauce and paste (not low-sodium or reduced-sodium). Regular tomato and vegetable juice (not low-sodium or reduced-sodium). Pickles. Olives. Fruits Canned fruit in a light or heavy syrup. Fried fruit. Fruit in cream or butter sauce. Meat and other protein foods Fatty cuts of meat. Ribs. Fried meat. Bacon. Sausage. Bologna and other processed lunch meats. Salami. Fatback. Hotdogs. Bratwurst. Salted nuts and seeds. Canned beans with added salt. Canned or smoked fish. Whole eggs or egg yolks. Chicken or turkey with skin. Dairy Whole or 2% milk, cream, and half-and-half. Whole or full-fat cream cheese. Whole-fat or sweetened yogurt. Full-fat cheese. Nondairy creamers. Whipped toppings.  Processed cheese and cheese spreads. Fats and oils Butter. Stick margarine. Lard. Shortening. Ghee. Bacon fat. Tropical oils, such as coconut, palm kernel, or palm oil. Seasoning and other foods Salted popcorn and pretzels. Onion salt, garlic salt, seasoned salt, table salt, and sea salt. Worcestershire sauce. Tartar sauce. Barbecue sauce. Teriyaki sauce. Soy sauce, including reduced-sodium. Steak sauce. Canned and packaged gravies. Fish sauce. Oyster sauce. Cocktail sauce. Horseradish that you find on the shelf. Ketchup. Mustard. Meat flavorings and tenderizers. Bouillon cubes. Hot sauce and Tabasco sauce. Premade or packaged marinades. Premade or packaged taco seasonings. Relishes. Regular salad dressings. Where to find more information:  National Heart, Lung, and Blood Institute: www.nhlbi.nih.gov  American Heart Association: www.heart.org Summary  The DASH eating plan is a healthy eating plan that has been shown to reduce high blood pressure (hypertension). It may also reduce your risk for type 2 diabetes, heart disease, and stroke.  With the DASH eating plan, you should limit salt (sodium) intake to 2,300 mg a day. If you have hypertension, you may need to reduce your sodium intake to 1,500 mg a day.  When on the DASH eating plan, aim to eat more fresh fruits and vegetables, whole grains, lean proteins, low-fat dairy, and heart-healthy fats.  Work with your health care provider or diet and nutrition specialist (dietitian) to adjust your eating plan to your individual   calorie needs. This information is not intended to replace advice given to you by your health care provider. Make sure you discuss any questions you have with your health care provider. Document Released: 12/19/2010 Document Revised: 12/24/2015 Document Reviewed: 12/24/2015 Elsevier Interactive Patient Education  2018 Elsevier Inc.  

## 2017-08-27 NOTE — Progress Notes (Signed)
Subjective:    Patient ID: Jacob Rios, male    DOB: Dec 27, 1945, 72 y.o.   MRN: [id number]   Chief Complaint: Medical Management of Chronic Issues   HPI:  1. Type 2 diabetes mellitus without complication, without long-term current use of insulin ([place]) last hgba1c 6.6% . Blood sugars at home running less than 110. No hypoglycemia.  2. Essential hypertension  No c/o chest pain, sob or headache. Blood pressures at home are less than 384 systolic.  3. Hyperlipidemia, unspecified hyperlipidemia type  Not really watching diet- eats whatever his wife fixes. Does no exercise  4. Gastroesophageal reflux disease without esophagitis  Takes omeprazole daily- has occasional heart burn if eats something real spicy.  5. CKD stage 3 due to type 2 diabetes mellitus ([place]) we are currently just watching labs  6. BMI 33.0-33.9,adult  No recent weight changes BMI Readings from Last 3 Encounters:  08/24/17 33.75 kg/m  04/20/17 33.86 kg/m  04/08/17 34.03 kg/m     7. 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 Only has to be seen yearly.    Outpatient Encounter Medications as of 08/27/2017  Medication Sig  . aspirin 81 MG tablet Take 81 mg by mouth daily.  Marland Kitchen atorvastatin (LIPITOR) 40 MG tablet Take 1 tablet (40 mg total) by mouth daily.  Marland Kitchen glipiZIDE (GLUCOTROL XL) 10 MG 24 hr tablet Take 1 tablet (10 mg total) by mouth daily.  Marland Kitchen glucose blood (ONETOUCH VERIO) test strip Test 1X per day and as needed  Dx 250.02  . Lancets (ONETOUCH ULTRASOFT) lancets Patient test 1X per day and prn  Dx 250.02  . lisinopril (PRINIVIL,ZESTRIL) 2.5 MG tablet Take 1 tablet (2.5 mg total) by mouth daily.  . metFORMIN (GLUCOPHAGE) 1000 MG tablet Take 1 tablet (1,000 mg total) by mouth 2 (two) times daily with a meal.  . metoprolol succinate (TOPROL-XL) 50 MG 24 hr tablet Take 1 tablet (50 mg total) by mouth daily.  . Omega-3 Fatty Acids (FISH OIL) 1000 MG CAPS  Take 1 capsule by mouth daily.   Marland Kitchen omeprazole (PRILOSEC) 40 MG capsule Take 1 capsule (40 mg total) by mouth daily.  . vitamin C (ASCORBIC ACID) 500 MG tablet Take 500 mg by mouth daily.        New complaints: Is having colonoscopy in October for positive cologuard  Social history: Lives with wifeof 52 years   Review of Systems  Constitutional: Negative for activity change and appetite change.  HENT: Negative.   Eyes: Negative for pain.  Respiratory: Negative for shortness of breath.   Cardiovascular: Negative for chest pain, palpitations and leg swelling.  Gastrointestinal: Negative for abdominal pain.  Endocrine: Negative for polydipsia.  Genitourinary: Negative.   Skin: Negative for rash.  Neurological: Negative for dizziness, weakness and headaches.  Hematological: Does not bruise/bleed easily.  Psychiatric/Behavioral: Negative.   All other systems reviewed and are negative.      Objective:   Physical Exam  Constitutional: He is oriented to person, place, and time. He appears well-developed and well-nourished.  HENT:  Head: Normocephalic.  Nose: Nose normal.  Mouth/Throat: Oropharynx is clear and moist.  Eyes: Pupils are equal, round, and reactive to light. EOM are normal.  Neck: Normal range of motion and phonation normal. Neck supple. No JVD present. Carotid bruit is not present. No thyroid mass and no thyromegaly present.  Cardiovascular: Normal rate and regular rhythm.  Pulmonary/Chest: Effort normal and breath sounds normal. No respiratory  distress.  Abdominal: Soft. Normal appearance, normal aorta and bowel sounds are normal. There is no tenderness.  Musculoskeletal: Normal range of motion.  Lymphadenopathy:    He has no cervical adenopathy.  Neurological: He is alert and oriented to person, place, and time.  Skin: Skin is warm and dry.  Psychiatric: He has a normal mood and affect. His behavior is normal. Judgment and thought content normal.  Nursing note  and vitals reviewed.  BP 137/81   Pulse 65   Temp (!) 97.2 F (36.2 C) (Oral)   hgba1c 6.6% today       Assessment & Plan:  ESHAAN TITZER comes in today with chief complaint of Medical Management of Chronic Issues   Diagnosis and orders addressed:  1. Type 2 diabetes mellitus without complication, without long-term current use of insulin ([facility]) Continue to watch carbs in diet - Bayer DCA Hb A1c Waived - glipiZIDE (GLUCOTROL XL) 10 MG 24 hr tablet; Take 1 tablet (10 mg total) by mouth daily.  Dispense: 90 tablet; Refill: 3 - metFORMIN (GLUCOPHAGE) 1000 MG tablet; Take 1 tablet (1,000 mg total) by mouth 2 (two) times daily with a meal.  Dispense: 180 tablet; Refill: 3  2. Essential hypertension Low sodium diet - CMP14+EGFR - metoprolol succinate (TOPROL-XL) 50 MG 24 hr tablet; Take 1 tablet (50 mg total) by mouth daily.  Dispense: 90 tablet; Refill: 3 - lisinopril (PRINIVIL,ZESTRIL) 2.5 MG tablet; Take 1 tablet (2.5 mg total) by mouth daily.  Dispense: 90 tablet; Refill: 3  3. Mixed hyperlipidemia Low fat diet - atorvastatin (LIPITOR) 40 MG tablet; Take 1 tablet (40 mg total) by mouth daily.  Dispense: 90 tablet; Refill: 3 - Lipid panel  4. Gastroesophageal reflux disease without esophagitis Avoid spicy foods Do not eat 2 hours prior to bedtime - omeprazole (PRILOSEC) 40 MG capsule; Take 1 capsule (40 mg total) by mouth daily.  Dispense: 90 capsule; Refill: 3  5. CKD stage 3 due to type 2 diabetes mellitus ([place]) Labs pending  6. BMI 33.0-33.9,adult Discussed diet and exercise for person with BMI >25 Will recheck weight in 3-6 months  7. 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

## 2017-09-25 ENCOUNTER — Ambulatory Visit: Payer: Medicare Other | Admitting: Urology

## 2017-09-25 DIAGNOSIS — R351 Nocturia: Secondary | ICD-10-CM | POA: Diagnosis not present

## 2017-09-25 DIAGNOSIS — N403 Nodular prostate with lower urinary tract symptoms: Secondary | ICD-10-CM | POA: Diagnosis not present

## 2017-09-25 DIAGNOSIS — R972 Elevated prostate specific antigen [PSA]: Secondary | ICD-10-CM

## 2017-09-25 DIAGNOSIS — C61 Malignant neoplasm of prostate: Secondary | ICD-10-CM | POA: Diagnosis not present

## 2017-10-16 ENCOUNTER — Telehealth: Payer: Self-pay | Admitting: Family Medicine

## 2017-10-16 NOTE — Telephone Encounter (Signed)
ntbs fro antiiotics

## 2017-10-16 NOTE — Telephone Encounter (Signed)
Aware. 

## 2017-10-21 ENCOUNTER — Encounter: Payer: Medicare Other | Admitting: Internal Medicine

## 2017-10-26 ENCOUNTER — Ambulatory Visit (INDEPENDENT_AMBULATORY_CARE_PROVIDER_SITE_OTHER): Payer: Medicare Other

## 2017-10-26 DIAGNOSIS — Z23 Encounter for immunization: Secondary | ICD-10-CM

## 2018-02-11 ENCOUNTER — Encounter: Payer: Self-pay | Admitting: Cardiovascular Disease

## 2018-02-11 ENCOUNTER — Ambulatory Visit: Payer: Medicare Other | Admitting: Cardiovascular Disease

## 2018-02-11 VITALS — BP 127/81 | HR 67 | Ht 71.0 in | Wt 245.2 lb

## 2018-02-11 DIAGNOSIS — E782 Mixed hyperlipidemia: Secondary | ICD-10-CM | POA: Diagnosis not present

## 2018-02-11 DIAGNOSIS — I10 Essential (primary) hypertension: Secondary | ICD-10-CM

## 2018-02-11 DIAGNOSIS — I2510 Atherosclerotic heart disease of native coronary artery without angina pectoris: Secondary | ICD-10-CM | POA: Diagnosis not present

## 2018-02-11 NOTE — Progress Notes (Signed)
Chief Complaint  Patient presents with  . Follow-up    CAD    History of Present Illness: 73 yo male with history of CAD s/p CABG 2000, HTN, HLD, DM and former tobacco abuse here today for cardiac follow up. In 2000 he had an MI with subsequent bypass surgery (last cath 2005 with occluded LIMA to LAD-native LAD open, patent SVG to diagonal and patent RIMA to RCA). He quit smoking in 1999. Stress myoview 12/01/12 with no ischemia.   He is here today for follow up. The patient denies any chest pain, dyspnea, palpitations, lower extremity edema, orthopnea, PND, dizziness, near syncope or syncope.   Primary Care Physician: [name], FNP  Past Medical History:  Diagnosis Date  . Coronary artery disease    3V CABG 2000. Last heart cath 2005, with 2 patent grafts  . Diabetes mellitus   . Hyperlipidemia   . Hypertension     Past Surgical History:  Procedure Laterality Date  . APPENDECTOMY    . CARDIAC CATHETERIZATION  2005  . CORONARY ARTERY BYPASS GRAFT  2000   3 vessel  . HERNIA REPAIR    . PROSTATE BIOPSY  06/2017    Current Outpatient Medications  Medication Sig Dispense Refill  . aspirin 81 MG tablet Take 81 mg by mouth daily.    Marland Kitchen atorvastatin (LIPITOR) 40 MG tablet Take 1 tablet (40 mg total) by mouth daily. 90 tablet 3  . glipiZIDE (GLUCOTROL XL) 10 MG 24 hr tablet Take 1 tablet (10 mg total) by mouth daily. 90 tablet 3  . glucose blood (ONETOUCH VERIO) test strip Test 1X per day and as needed  Dx 250.02 100 each 12  . Lancets (ONETOUCH ULTRASOFT) lancets Patient test 1X per day and prn  Dx 250.02 100 each 12  . lisinopril (PRINIVIL,ZESTRIL) 2.5 MG tablet Take 1 tablet (2.5 mg total) by mouth daily. 90 tablet 3  . metFORMIN (GLUCOPHAGE) 1000 MG tablet Take 1 tablet (1,000 mg total) by mouth 2 (two) times daily with a meal. 180 tablet 3  . metoprolol succinate (TOPROL-XL) 50 MG 24 hr tablet Take 1 tablet (50 mg total) by mouth daily. 90 tablet 3  . Omega-3  Fatty Acids (FISH OIL) 1000 MG CAPS Take 1 capsule by mouth daily.     Marland Kitchen omeprazole (PRILOSEC) 40 MG capsule Take 1 capsule (40 mg total) by mouth daily. 90 capsule 3  . vitamin C (ASCORBIC ACID) 500 MG tablet Take 500 mg by mouth daily.       No current facility-administered medications for this visit.     Allergies  Allergen Reactions  . Morphine Swelling and Other (See Comments)    PT DOESNT REMEMBER /     Social History   Socioeconomic History  . Marital status: Married    Spouse name: Not on file  . Number of children: Not on file  . Years of education: Not on file  . Highest education level: Not on file  Occupational History  . Occupation: part-time Haematologist  . Financial resource strain: Not on file  . Food insecurity:    Worry: Not on file    Inability: Not on file  . Transportation needs:    Medical: Not on file    Non-medical: Not on file  Tobacco Use  . Smoking status: Former Smoker    Last attempt to quit: 01/13/1997    Years since quitting: 21.0  . Smokeless tobacco: Never Used  Substance and  Sexual Activity  . Alcohol use: No  . Drug use: No  . Sexual activity: Not on file  Lifestyle  . Physical activity:    Days per week: Not on file    Minutes per session: Not on file  . Stress: Not on file  Relationships  . Social connections:    Talks on phone: Not on file    Gets together: Not on file    Attends religious service: Not on file    Active member of club or organization: Not on file    Attends meetings of clubs or organizations: Not on file    Relationship status: Not on file  . Intimate partner violence:    Fear of current or ex partner: Not on file    Emotionally abused: Not on file    Physically abused: Not on file    Forced sexual activity: Not on file  Other Topics Concern  . Not on file  Social History Narrative  . Not on file    Family History  Problem Relation Age of Onset  . Lung cancer Mother   . Cancer Mother   .  Dementia Father   . Hyperlipidemia Father   . Heart attack Neg Hx   . Stroke Neg Hx     Review of Systems:  As stated in the HPI and otherwise negative.   BP 127/81   Pulse 67   Ht 5\' 11"  (1.803 m)   Wt 245 lb 3.2 oz (111.2 kg)   SpO2 99%   BMI 34.20 kg/m   Physical Examination:  General: Well developed, well nourished, NAD  HEENT: OP clear, mucus membranes moist  SKIN: warm, dry. No rashes. Neuro: No focal deficits  Musculoskeletal: Muscle strength 5/5 all ext  Psychiatric: Mood and affect normal  Neck: No JVD, no carotid bruits, no thyromegaly, no lymphadenopathy.  Lungs:Clear bilaterally, no wheezes, rhonci, crackles Cardiovascular: Regular rate and rhythm. No murmurs, gallops or rubs. Abdomen:Soft. Bowel sounds present. Non-tender.  Extremities: No lower extremity edema. Pulses are 2 + in the bilateral DP/PT.  EKG:  EKG is ordered today. The ekg ordered today demonstrates NSR, rate 67 bpm  Recent Labs: 04/20/2017: Hemoglobin 13.2; Platelets 167; TSH 1.260 08/27/2017: ALT 15; BUN 19; Creatinine, Ser 1.55; Potassium 4.9; Sodium 139   Lipid Panel    Component Value Date/Time   CHOL 126 08/27/2017 0917   CHOL 107 07/30/2012 1158   TRIG 110 08/27/2017 0917   TRIG 96 07/30/2012 1158   HDL 31 (L) 08/27/2017 0917   HDL 24 (L) 07/30/2012 1158   CHOLHDL 4.1 08/27/2017 0917   LDLCALC 73 08/27/2017 0917   LDLCALC 64 07/30/2012 1158     Wt Readings from Last 3 Encounters:  02/11/18 245 lb 3.2 oz (111.2 kg)  08/24/17 242 lb (109.8 kg)  04/20/17 242 lb 12.8 oz (110.1 kg)     Other studies Reviewed: Additional studies/ records that were reviewed today include: . Review of the above records demonstrates:    Assessment and Plan:   1. CAD without angina: No chest pain. Continue ASA, statin and beta blocker.  Will discuss arranging an echo to assess LV systolic function at the next visit.   2. HTN: BP is well controlled.   3. Hyperlipidemia:  LDL at goal. Continue  statin.   Current medicines are reviewed at length with the patient today.  The patient does not have concerns regarding medicines.  The following changes have been made:  no change  Labs/ tests ordered today include:   Orders Placed This Encounter  Procedures  . EKG 12-Lead   Disposition:   FU with me in 12  months   Signed, Lauree Chandler, MD 02/11/2018 9:44 AM    [facility] [address], [place], [place]  [address] Phone: [phone]; Fax: [phone]

## 2018-02-11 NOTE — Patient Instructions (Signed)

## 2018-03-04 ENCOUNTER — Ambulatory Visit: Payer: Medicare Other | Admitting: Nurse Practitioner

## 2018-03-04 ENCOUNTER — Encounter: Payer: Self-pay | Admitting: Nurse Practitioner

## 2018-03-04 VITALS — BP 134/73 | HR 79 | Temp 98.3°F | Ht 71.0 in | Wt 246.0 lb

## 2018-03-04 DIAGNOSIS — K219 Gastro-esophageal reflux disease without esophagitis: Secondary | ICD-10-CM

## 2018-03-04 DIAGNOSIS — E1122 Type 2 diabetes mellitus with diabetic chronic kidney disease: Secondary | ICD-10-CM

## 2018-03-04 DIAGNOSIS — I10 Essential (primary) hypertension: Secondary | ICD-10-CM

## 2018-03-04 DIAGNOSIS — E119 Type 2 diabetes mellitus without complications: Secondary | ICD-10-CM

## 2018-03-04 DIAGNOSIS — N183 Chronic kidney disease, stage 3 (moderate): Secondary | ICD-10-CM

## 2018-03-04 DIAGNOSIS — E782 Mixed hyperlipidemia: Secondary | ICD-10-CM

## 2018-03-04 DIAGNOSIS — Z6835 Body mass index (BMI) 35.0-35.9, adult: Secondary | ICD-10-CM

## 2018-03-04 LAB — BAYER DCA HB A1C WAIVED: HB A1C (BAYER DCA - WAIVED): 6.6 % (ref <7.0)

## 2018-03-04 MED ORDER — LISINOPRIL 2.5 MG PO TABS: 2.5000 mg | ORAL_TABLET | Freq: Every day | ORAL | 1 refills | Status: DC

## 2018-03-04 MED ORDER — METFORMIN HCL 1000 MG PO TABS: 1000.0000 mg | ORAL_TABLET | Freq: Two times a day (BID) | ORAL | 1 refills | Status: DC

## 2018-03-04 MED ORDER — ATORVASTATIN CALCIUM 40 MG PO TABS: 40.0000 mg | ORAL_TABLET | Freq: Every day | ORAL | 1 refills | Status: DC

## 2018-03-04 MED ORDER — GLIPIZIDE ER 10 MG PO TB24: 10.0000 mg | ORAL_TABLET | Freq: Every day | ORAL | 1 refills | Status: DC

## 2018-03-04 MED ORDER — METOPROLOL SUCCINATE ER 50 MG PO TB24: 50.0000 mg | ORAL_TABLET | Freq: Every day | ORAL | 1 refills | Status: DC

## 2018-03-04 MED ORDER — OMEPRAZOLE 40 MG PO CPDR: 40.0000 mg | DELAYED_RELEASE_CAPSULE | Freq: Every day | ORAL | 1 refills | Status: DC

## 2018-03-04 NOTE — Progress Notes (Signed)
Subjective:    Patient ID: ABRHAM Rios, male    DOB: 1945/05/28, 73 y.o.   MRN: [id number]   Chief Complaint: medical management of chronic issues  HPI:  1. Essential hypertension  No c/o chest pain sob or headache. Does not check blood pressure at home. BP Readings from Last 3 Encounters:  02/11/18 127/81  08/27/17 137/81  08/24/17 140/80     2. Gastroesophageal reflux disease without esophagitis  Takes omeprazole daily and works well to keep symptoms under control.  3. Mixed hyperlipidemia  Does not watch diet and does no exercise.  4. Type 2 diabetes mellitus without complication, without long-term current use of insulin ([place]) last hgab1c 6.6. does not check blood sugars everyday at home. They are usually below 150 when he does check them.  5. CKD stage 3 due to type 2 diabetes mellitus (HCC)  Creatine was 1.55- we are currently watching labs.  6. BMI 35.0-35.9,adult  No recent weight changes.    Outpatient Encounter Medications as of 03/04/2018  Medication Sig  . aspirin 81 MG tablet Take 81 mg by mouth daily.  Marland Kitchen atorvastatin (LIPITOR) 40 MG tablet Take 1 tablet (40 mg total) by mouth daily.  Marland Kitchen glipiZIDE (GLUCOTROL XL) 10 MG 24 hr tablet Take 1 tablet (10 mg total) by mouth daily.  Marland Kitchen glucose blood (ONETOUCH VERIO) test strip Test 1X per day and as needed  Dx 250.02  . Lancets (ONETOUCH ULTRASOFT) lancets Patient test 1X per day and prn  Dx 250.02  . lisinopril (PRINIVIL,ZESTRIL) 2.5 MG tablet Take 1 tablet (2.5 mg total) by mouth daily.  . metFORMIN (GLUCOPHAGE) 1000 MG tablet Take 1 tablet (1,000 mg total) by mouth 2 (two) times daily with a meal.  . metoprolol succinate (TOPROL-XL) 50 MG 24 hr tablet Take 1 tablet (50 mg total) by mouth daily.  . Omega-3 Fatty Acids (FISH OIL) 1000 MG CAPS Take 1 capsule by mouth daily.   Marland Kitchen omeprazole (PRILOSEC) 40 MG capsule Take 1 capsule (40 mg total) by mouth daily.  . vitamin C (ASCORBIC ACID) 500 MG tablet Take 500 mg by mouth  daily.         New complaints: None today  Social history: Lives with his wife   Review of Systems  Constitutional: Negative for activity change and appetite change.  HENT: Negative.   Eyes: Negative for pain.  Respiratory: Negative for shortness of breath.   Cardiovascular: Negative for chest pain, palpitations and leg swelling.  Gastrointestinal: Negative for abdominal pain.  Endocrine: Negative for polydipsia.  Genitourinary: Negative.   Skin: Negative for rash.  Neurological: Negative for dizziness, weakness and headaches.  Hematological: Does not bruise/bleed easily.  Psychiatric/Behavioral: Negative.   All other systems reviewed and are negative.      Objective:   Physical Exam Vitals signs and nursing note reviewed.  Constitutional:      Appearance: Normal appearance. He is well-developed.  HENT:     Head: Normocephalic.     Nose: Nose normal.  Eyes:     Pupils: Pupils are equal, round, and reactive to light.  Neck:     Musculoskeletal: Normal range of motion and neck supple.     Thyroid: No thyroid mass or thyromegaly.     Vascular: No carotid bruit or JVD.     Trachea: Phonation normal.  Cardiovascular:     Rate and Rhythm: Normal rate and regular rhythm.  Pulmonary:     Effort: Pulmonary effort is normal. No respiratory  distress.     Breath sounds: Normal breath sounds.  Abdominal:     General: Bowel sounds are normal.     Palpations: Abdomen is soft.     Tenderness: There is no abdominal tenderness.  Musculoskeletal: Normal range of motion.  Lymphadenopathy:     Cervical: No cervical adenopathy.  Skin:    General: Skin is warm and dry.  Neurological:     Mental Status: He is alert and oriented to person, place, and time.  Psychiatric:        Behavior: Behavior normal.        Thought Content: Thought content normal.        Judgment: Judgment normal.     BP 134/73   Pulse 79   Temp 98.3 F (36.8 C) (Oral)   Ht _0  (1.803 m)   Wt 246  lb (111.6 kg)   BMI 34.31 kg/m   hgba1c 6.6     Assessment & Plan:  Jacob Rios comes in today with chief complaint of Medical Management of Chronic Issues   Diagnosis and orders addressed:  1. Essential hypertension Low sodium diet - CMP14+EGFR - metoprolol succinate (TOPROL-XL) 50 MG 24 hr tablet; Take 1 tablet (50 mg total) by mouth daily.  Dispense: 90 tablet; Refill: 1 - lisinopril (PRINIVIL,ZESTRIL) 2.5 MG tablet; Take 1 tablet (2.5 mg total) by mouth daily.  Dispense: 90 tablet; Refill: 1  2. Gastroesophageal reflux disease without esophagitis Avoid spicy foods Do not eat 2 hours prior to bedtime - omeprazole (PRILOSEC) 40 MG capsule; Take 1 capsule (40 mg total) by mouth daily.  Dispense: 90 capsule; Refill: 1  3. Mixed hyperlipidemia Low fat diet - Lipid panel - atorvastatin (LIPITOR) 40 MG tablet; Take 1 tablet (40 mg total) by mouth daily.  Dispense: 90 tablet; Refill: 1  4. Type 2 diabetes mellitus without complication, without long-term current use of insulin (HCC) Continue t o watch carbs in diet - Bayer DCA Hb A1c Waived - glipiZIDE (GLUCOTROL XL) 10 MG 24 hr tablet; Take 1 tablet (10 mg total) by mouth daily.  Dispense: 90 tablet; Refill: 1 - metFORMIN (GLUCOPHAGE) 1000 MG tablet; Take 1 tablet (1,000 mg total) by mouth 2 (two) times daily with a meal.  Dispense: 180 tablet; Refill: 1  5. CKD stage 3 due to type 2 diabetes mellitus ([place]) Labs pending  6. BMI 35.0-35.9,adult Discussed diet and exercise for person with BMI >25 Will recheck weight in 3-6 months   Labs pending Health Maintenance reviewed Diet and exercise encouraged  Follow up plan: 3 months   [name], FNP

## 2018-03-04 NOTE — Patient Instructions (Signed)
Health Maintenance After Age 72 After age 72, you are at a higher risk for certain long-term diseases and infections as well as injuries from falls. Falls are a major cause of broken bones and head injuries in people who are older than age 72. Getting regular preventive care can help to keep you healthy and well. Preventive care includes getting regular testing and making lifestyle changes as recommended by your health care provider. Talk with your health care provider about:  Which screenings and tests you should have. A screening is a test that checks for a disease when you have no symptoms.  A diet and exercise plan that is right for you. What should I know about screenings and tests to prevent falls? Screening and testing are the best ways to find a health problem early. Early diagnosis and treatment give you the best chance of managing medical conditions that are common after age 72. Certain conditions and lifestyle choices may make you more likely to have a fall. Your health care provider may recommend:  Regular vision checks. Poor vision and conditions such as cataracts can make you more likely to have a fall. If you wear glasses, make sure to get your prescription updated if your vision changes.  Medicine review. Work with your health care provider to regularly review all of the medicines you are taking, including over-the-counter medicines. Ask your health care provider about any side effects that may make you more likely to have a fall. Tell your health care provider if any medicines that you take make you feel dizzy or sleepy.  Osteoporosis screening. Osteoporosis is a condition that causes the bones to get weaker. This can make the bones weak and cause them to break more easily.  Blood pressure screening. Blood pressure changes and medicines to control blood pressure can make you feel dizzy.  Strength and balance checks. Your health care provider may recommend certain tests to check your  strength and balance while standing, walking, or changing positions.  Foot health exam. Foot pain and numbness, as well as not wearing proper footwear, can make you more likely to have a fall.  Depression screening. You may be more likely to have a fall if you have a fear of falling, feel emotionally low, or feel unable to do activities that you used to do.  Alcohol use screening. Using too much alcohol can affect your balance and may make you more likely to have a fall. What actions can I take to lower my risk of falls? General instructions  Talk with your health care provider about your risks for falling. Tell your health care provider if: ? You fall. Be sure to tell your health care provider about all falls, even ones that seem minor. ? You feel dizzy, sleepy, or off-balance.  Take over-the-counter and prescription medicines only as told by your health care provider. These include any supplements.  Eat a healthy diet and maintain a healthy weight. A healthy diet includes low-fat dairy products, low-fat (lean) meats, and fiber from whole grains, beans, and lots of fruits and vegetables. Home safety  Remove any tripping hazards, such as rugs, cords, and clutter.  Install safety equipment such as grab bars in bathrooms and safety rails on stairs.  Keep rooms and walkways well-lit. Activity   Follow a regular exercise program to stay fit. This will help you maintain your balance. Ask your health care provider what types of exercise are appropriate for you.  If you need a cane or   walker, use it as recommended by your health care provider.  Wear supportive shoes that have nonskid soles. Lifestyle  Do not drink alcohol if your health care provider tells you not to drink.  If you drink alcohol, limit how much you have: ? 0-1 drink a day for women. ? 0-2 drinks a day for men.  Be aware of how much alcohol is in your drink. In the [place] one drink equals one typical bottle of beer (12  oz), one-half glass of wine (5 oz), or one shot of hard liquor (1 oz).  Do not use any products that contain nicotine or tobacco, such as cigarettes and e-cigarettes. If you need help quitting, ask your health care provider. Summary  Having a healthy lifestyle and getting preventive care can help to protect your health and wellness after age 72.  Screening and testing are the best way to find a health problem early and help you avoid having a fall. Early diagnosis and treatment give you the best chance for managing medical conditions that are more common for people who are older than age 72.  Falls are a major cause of broken bones and head injuries in people who are older than age 72. Take precautions to prevent a fall at home.  Work with your health care provider to learn what changes you can make to improve your health and wellness and to prevent falls. This information is not intended to replace advice given to you by your health care provider. Make sure you discuss any questions you have with your health care provider. Document Released: 11/12/2016 Document Revised: 11/12/2016 Document Reviewed: 11/12/2016 Elsevier Interactive Patient Education  2019 Elsevier Inc.  

## 2018-03-05 ENCOUNTER — Telehealth: Payer: Self-pay | Admitting: Nurse Practitioner

## 2018-03-05 LAB — CMP14+EGFR
ALT: 15 IU/L (ref 0–44)
AST: 12 IU/L (ref 0–40)
Albumin/Globulin Ratio: 1.5 (ref 1.2–2.2)
Albumin: 4 g/dL (ref 3.7–4.7)
Alkaline Phosphatase: 86 IU/L (ref 39–117)
BUN/Creatinine Ratio: 15 (ref 10–24)
BUN: 20 mg/dL (ref 8–27)
Bilirubin Total: 0.4 mg/dL (ref 0.0–1.2)
CO2: 22 mmol/L (ref 20–29)
Calcium: 9.2 mg/dL (ref 8.6–10.2)
Chloride: 105 mmol/L (ref 96–106)
Creatinine, Ser: 1.31 mg/dL — ABNORMAL HIGH (ref 0.76–1.27)
GFR calc Af Amer: 63 mL/min/{1.73_m2} (ref >59)
GFR calc non Af Amer: 54 mL/min/{1.73_m2} — ABNORMAL LOW (ref >59)
Globulin, Total: 2.6 g/dL (ref 1.5–4.5)
Glucose: 107 mg/dL — ABNORMAL HIGH (ref 65–99)
Potassium: 5 mmol/L (ref 3.5–5.2)
Sodium: 140 mmol/L (ref 134–144)
Total Protein: 6.6 g/dL (ref 6.0–8.5)

## 2018-03-05 LAB — LIPID PANEL
CHOL/HDL RATIO: 2.9 ratio (ref 0.0–5.0)
Cholesterol, Total: 109 mg/dL (ref 100–199)
HDL: 37 mg/dL — ABNORMAL LOW (ref >39)
LDL Calculated: 57 mg/dL (ref 0–99)
Triglycerides: 75 mg/dL (ref 0–149)
VLDL Cholesterol Cal: 15 mg/dL (ref 5–40)

## 2018-03-05 MED ORDER — CYCLOBENZAPRINE HCL 5 MG PO TABS: 5.0000 mg | ORAL_TABLET | Freq: Three times a day (TID) | ORAL | 0 refills | Status: DC | PRN

## 2018-03-05 NOTE — Telephone Encounter (Signed)
Flexeril 5mg  sent to pharmacy

## 2018-03-05 NOTE — Telephone Encounter (Signed)
Wife aware

## 2018-03-10 ENCOUNTER — Encounter: Payer: Self-pay | Admitting: Nurse Practitioner

## 2018-03-10 ENCOUNTER — Ambulatory Visit: Payer: Medicare Other | Admitting: Nurse Practitioner

## 2018-03-10 VITALS — BP 139/76 | HR 74 | Temp 97.1°F | Ht 71.0 in | Wt 237.8 lb

## 2018-03-10 DIAGNOSIS — M545 Low back pain, unspecified: Secondary | ICD-10-CM

## 2018-03-10 MED ORDER — METHYLPREDNISOLONE ACETATE 80 MG/ML IJ SUSP
80.0000 mg | Freq: Once | INTRAMUSCULAR | Status: AC
  Administered 2018-03-10: 80 mg via INTRAMUSCULAR

## 2018-03-10 MED ORDER — NAPROXEN 500 MG PO TABS: 500.0000 mg | ORAL_TABLET | Freq: Two times a day (BID) | ORAL | 1 refills | Status: DC

## 2018-03-10 MED ORDER — CYCLOBENZAPRINE HCL 10 MG PO TABS: 10.0000 mg | ORAL_TABLET | Freq: Three times a day (TID) | ORAL | 1 refills | Status: DC | PRN

## 2018-03-10 NOTE — Patient Instructions (Signed)
Acute Back Pain, Adult  Acute back pain is sudden and usually short-lived. It is often caused by an injury to the muscles and tissues in the back. The injury may result from:   A muscle or ligament getting overstretched or torn (strained). Ligaments are tissues that connect bones to each other. Lifting something improperly can cause a back strain.   Wear and tear (degeneration) of the spinal disks. Spinal disks are circular tissue that provides cushioning between the bones of the spine (vertebrae).   Twisting motions, such as while playing sports or doing yard work.   A hit to the back.   Arthritis.  You may have a physical exam, lab tests, and imaging tests to find the cause of your pain. Acute back pain usually goes away with rest and home care.  Follow these instructions at home:  Managing pain, stiffness, and swelling   Take over-the-counter and prescription medicines only as told by your health care provider.   Your health care provider may recommend applying ice during the first 24-48 hours after your pain starts. To do this:  ? Put ice in a plastic bag.  ? Place a towel between your skin and the bag.  ? Leave the ice on for 20 minutes, 2-3 times a day.   If directed, apply heat to the affected area as often as told by your health care provider. Use the heat source that your health care provider recommends, such as a moist heat pack or a heating pad.  ? Place a towel between your skin and the heat source.  ? Leave the heat on for 20-30 minutes.  ? Remove the heat if your skin turns bright red. This is especially important if you are unable to feel pain, heat, or cold. You have a greater risk of getting burned.  Activity     Do not stay in bed. Staying in bed for more than 1-2 days can delay your recovery.   Sit up and stand up straight. Avoid leaning forward when you sit, or hunching over when you stand.  ? If you work at a desk, sit close to it so you do not need to lean over. Keep your chin tucked  in. Keep your neck drawn back, and keep your elbows bent at a right angle. Your arms should look like the letter "L."  ? Sit high and close to the steering wheel when you drive. Add lower back (lumbar) support to your car seat, if needed.   Take short walks on even surfaces as soon as you are able. Try to increase the length of time you walk each day.   Do not sit, drive, or stand in one place for more than 30 minutes at a time. Sitting or standing for long periods of time can put stress on your back.   Do not drive or use heavy machinery while taking prescription pain medicine.   Use proper lifting techniques. When you bend and lift, use positions that put less stress on your back:  ? Bend your knees.  ? Keep the load close to your body.  ? Avoid twisting.   Exercise regularly as told by your health care provider. Exercising helps your back heal faster and helps prevent back injuries by keeping muscles strong and flexible.   Work with a physical therapist to make a safe exercise program, as recommended by your health care provider. Do any exercises as told by your physical therapist.  Lifestyle   Maintain   a healthy weight. Extra weight puts stress on your back and makes it difficult to have good posture.   Avoid activities or situations that make you feel anxious or stressed. Stress and anxiety increase muscle tension and can make back pain worse. Learn ways to manage anxiety and stress, such as through exercise.  General instructions   Sleep on a firm mattress in a comfortable position. Try lying on your side with your knees slightly bent. If you lie on your back, put a pillow under your knees.   Follow your treatment plan as told by your health care provider. This may include:  ? Cognitive or behavioral therapy.  ? Acupuncture or massage therapy.  ? Meditation or yoga.  Contact a health care provider if:   You have pain that is not relieved with rest or medicine.   You have increasing pain going down  into your legs or buttocks.   Your pain does not improve after 2 weeks.   You have pain at night.   You lose weight without trying.   You have a fever or chills.  Get help right away if:   You develop new bowel or bladder control problems.   You have unusual weakness or numbness in your arms or legs.   You develop nausea or vomiting.   You develop abdominal pain.   You feel faint.  Summary   Acute back pain is sudden and usually short-lived.   Use proper lifting techniques. When you bend and lift, use positions that put less stress on your back.   Take over-the-counter and prescription medicines and apply heat or ice as directed by your health care provider.  This information is not intended to replace advice given to you by your health care provider. Make sure you discuss any questions you have with your health care provider.  Document Released: 12/30/2004 Document Revised: 08/06/2017 Document Reviewed: 08/13/2016  Elsevier Interactive Patient Education  2019 [employer]vier Inc.

## 2018-03-10 NOTE — Progress Notes (Signed)
   Subjective:    Patient ID: Jacob Rios, male    DOB: 09-18-1945, 73 y.o.   MRN: [id number]   Chief Complaint: left hip pain   HPI Patient comes in today c/o left lower back pain that radiates into left hip. He moved a 40 gallon propane tank and felt a pull in his back. Rates pain 3/10 currently. Walking around increases pain. Resting helps.   Review of Systems  Respiratory: Negative.   Cardiovascular: Negative.   Musculoskeletal: Positive for back pain.  Neurological: Negative.   Psychiatric/Behavioral: Negative.   All other systems reviewed and are negative.      Objective:   Physical Exam Vitals signs and nursing note reviewed.  Constitutional:      Appearance: Normal appearance. He is obese.  Cardiovascular:     Rate and Rhythm: Normal rate and regular rhythm.     Heart sounds: Normal heart sounds.  Pulmonary:     Breath sounds: Normal breath sounds.  Abdominal:     General: Abdomen is flat.     Palpations: Abdomen is soft.  Musculoskeletal:     Comments: Pain left lower back on palpation. FROM of lumbar spine with slight pain on flexion. (-) SLR bil Motor strength and  sensation intact  Skin:    General: Skin is warm and dry.  Neurological:     General: No focal deficit present.     Mental Status: He is alert and oriented to person, place, and time.     Cranial Nerves: No cranial nerve deficit.     Sensory: No sensory deficit.  Psychiatric:        Mood and Affect: Mood normal.        Behavior: Behavior normal.    BP 139/76   Pulse 74   Temp (!) 97.1 F (36.2 C) (Oral)   Ht 5\' 11"  (1.803 m)   Wt 237 lb 12.8 oz (107.9 kg)   BMI 33.17 kg/m         Assessment & Plan:  Jacob Rios in today with chief complaint of left hip pain   1. Acute left-sided low back pain without sciatica Moist heat Rest  No heavy lifting - cyclobenzaprine (FLEXERIL) 10 MG tablet; Take 1 tablet (10 mg total) by mouth 3 (three) times daily as needed for muscle  spasms.  Dispense: 30 tablet; Refill: 1 - naproxen (NAPROSYN) 500 MG tablet; Take 1 tablet (500 mg total) by mouth 2 (two) times daily with a meal.  Dispense: 60 tablet; Refill: 1 - methylPREDNISolone acetate (DEPO-MEDROL) injection 80 mg  [name], FNP

## 2018-03-26 ENCOUNTER — Ambulatory Visit: Payer: Medicare Other | Admitting: Urology

## 2018-03-26 DIAGNOSIS — R972 Elevated prostate specific antigen [PSA]: Secondary | ICD-10-CM | POA: Diagnosis not present

## 2018-03-26 DIAGNOSIS — C61 Malignant neoplasm of prostate: Secondary | ICD-10-CM | POA: Diagnosis not present

## 2018-03-26 DIAGNOSIS — N401 Enlarged prostate with lower urinary tract symptoms: Secondary | ICD-10-CM

## 2018-05-27 ENCOUNTER — Ambulatory Visit (INDEPENDENT_AMBULATORY_CARE_PROVIDER_SITE_OTHER): Payer: Medicare Other | Admitting: *Deleted

## 2018-05-27 ENCOUNTER — Other Ambulatory Visit: Payer: Self-pay

## 2018-05-27 VITALS — BP 130/75 | HR 65 | Ht 71.0 in | Wt 240.0 lb

## 2018-05-27 DIAGNOSIS — Z0000 Encounter for general adult medical examination without abnormal findings: Secondary | ICD-10-CM

## 2018-05-27 NOTE — Patient Instructions (Signed)
  Jacob Rios , Thank you for taking time to come for your Medicare Wellness Visit. I appreciate your ongoing commitment to your health goals. Please review the following plan we discussed and let me know if I can assist you in the future.   These are the goals we discussed: Goals    . Prevent falls     Stay active     . Weight (lb) < 220 lb (99.8 kg)       This is a list of the screening recommended for you and due dates:  Health Maintenance  Topic Date Due  . Colon Cancer Screening  09/23/1996  . Eye exam for diabetics  06/16/2017  . Complete foot exam   12/18/2017  . Flu Shot  08/14/2018  . Hemoglobin A1C  09/02/2018  . Tetanus Vaccine  07/31/2022  .  Hepatitis C: One time screening is recommended by Center for Disease Control  (CDC) for  adults born from [birth year] through 1964.   Completed  . Pneumonia vaccines  Completed

## 2018-05-27 NOTE — Progress Notes (Addendum)
MEDICARE ANNUAL WELLNESS VISIT  05/27/2018  Telephone Visit Disclaimer This Medicare AWV was conducted by telephone due to national recommendations for restrictions regarding the COVID-19 Pandemic (e.g. social distancing).  I verified, using two identifiers, that I am speaking with Jacob Rios or their authorized healthcare agent. I discussed the limitations, risks, security, and privacy concerns of performing an evaluation and management service by telephone and the potential availability of an in-person appointment in the future. The patient expressed understanding and agreed to proceed.   Subjective:  Jacob Rios is a 73 y.o. male patient of [name], [place] who had a Medicare Annual Wellness Visit today via telephone. Jacob Rios is Retired and lives with his spouse, Jacob Rios. he has 3 children. he reports that he is socially active and does interact with friends/family regularly. he is moderately physically active and enjoys fishing.  Patient Care Team: [name], FNP as PCP - General (Family Medicine) Burnell Blanks, MD as PCP - Cardiology (Cardiology) [name], MD as Attending Physician (Urology) Harlen Labs, MD as Referring Physician (Optometry)  Advanced Directives 05/27/2018 10/23/2015  Does Patient Have a Medical Advance Directive? No No  Would patient like information on creating a medical advance directive? No - Patient declined No - patient declined information    Hospital Utilization Over the Past 12 Months: # of hospitalizations or ER visits: 0 # of surgeries: 0  Review of Systems    Patient reports that his overall health is unchanged compared to last year.  Patient Reported Readings (BP, Pulse, CBG, Weight, etc) BP 130/75 Comment: home BP reading  Pulse 65   Ht 5\' 11"  (1.803 m)   Wt 240 lb (108.9 kg)   BMI 33.47 kg/m    Review of Systems: General ROS: negative  All other systems negative.  Pain Assessment       Current Medications & Allergies (verified) Allergies as of 05/27/2018      Reactions   Morphine Swelling, Other (See Comments)   PT DOESNT REMEMBER /       Medication List       Accurate as of May 27, 2018  8:56 AM. If you have any questions, ask your nurse or doctor.        STOP taking these medications   cyclobenzaprine 10 MG tablet Commonly known as:  FLEXERIL   naproxen 500 MG tablet Commonly known as:  Naprosyn     TAKE these medications   aspirin 81 MG tablet Take 81 mg by mouth daily.   atorvastatin 40 MG tablet Commonly known as:  LIPITOR Take 1 tablet (40 mg total) by mouth daily.   Fish Oil 1000 MG Caps Take 1 capsule by mouth daily.   glipiZIDE 10 MG 24 hr tablet Commonly known as:  GLUCOTROL XL Take 1 tablet (10 mg total) by mouth daily.   glucose blood test strip Commonly known as:  OneTouch Verio Test 1X per day and as needed  Dx 250.02   lisinopril 2.5 MG tablet Commonly known as:  ZESTRIL Take 1 tablet (2.5 mg total) by mouth daily.   metFORMIN 1000 MG tablet Commonly known as:  GLUCOPHAGE Take 1 tablet (1,000 mg total) by mouth 2 (two) times daily with a meal.   metoprolol succinate 50 MG 24 hr tablet Commonly known as:  TOPROL-XL Take 1 tablet (50 mg total) by mouth daily.   omeprazole 40 MG capsule Commonly known as:  PRILOSEC Take 1 capsule (40 mg total)  by mouth daily.   onetouch ultrasoft lancets Patient test 1X per day and prn  Dx 250.02   vitamin C 500 MG tablet Commonly known as:  ASCORBIC ACID Take 500 mg by mouth daily.       History (reviewed): Past Medical History:  Diagnosis Date  . Cancer [facility])    prostate cancer  . Coronary artery disease    3V CABG 2000. Last heart cath 2005, with 2 patent grafts  . Diabetes mellitus   . Hyperlipidemia   . Hypertension    Past Surgical History:  Procedure Laterality Date  . APPENDECTOMY    . CARDIAC CATHETERIZATION  2005  . CORONARY ARTERY BYPASS GRAFT  2000   3 vessel   . HERNIA REPAIR  1974  . PROSTATE BIOPSY  06/2017   x 4-5    Family History  Problem Relation Age of Onset  . Lung cancer Mother   . Cancer Mother   . Dementia Father   . Hyperlipidemia Father   . Congestive Heart Failure Father   . Post-traumatic stress disorder Son   . Alcohol abuse Maternal Grandfather   . Cancer Maternal Grandfather 90       stomach cancer   . Heart attack Neg Hx   . Stroke Neg Hx    Social History   Socioeconomic History  . Marital status: Married    Spouse name: Jacob Rios   . Number of children: 3  . Years of education: Not on file  . Highest education level: Not on file  Occupational History  . Occupation: part-time Pharmacist, community    Comment: retired   Scientific laboratory technician  . Financial resource strain: Not on file  . Food insecurity:    Worry: Not on file    Inability: Not on file  . Transportation needs:    Medical: Not on file    Non-medical: Not on file  Tobacco Use  . Smoking status: Former Smoker    Last attempt to quit: 01/13/1997    Years since quitting: 21.3  . Smokeless tobacco: Never Used  Substance and Sexual Activity  . Alcohol use: No  . Drug use: No  . Sexual activity: Not on file  Lifestyle  . Physical activity:    Days per week: Not on file    Minutes per session: Not on file  . Stress: Not on file  Relationships  . Social connections:    Talks on phone: Not on file    Gets together: Not on file    Attends religious service: Not on file    Active member of club or organization: Not on file    Attends meetings of clubs or organizations: Not on file    Relationship status: Not on file  Other Topics Concern  . Not on file  Social History Narrative  . Not on file    Activities of Daily Living In your present state of health, do you have any difficulty performing the following activities: 05/27/2018  Hearing? Y  Comment left left ear drum in service.  Vision? N  Difficulty concentrating or making decisions? N  Walking or climbing  stairs? N  Dressing or bathing? N  Doing errands, shopping? N  Preparing Food and eating ? N  Using the Toilet? N  In the past six months, have you accidently leaked urine? N  Do you have problems with loss of bowel control? N  Managing your Medications? N  Managing your Finances? N  Housekeeping or managing your  Housekeeping? N  Some recent data might be hidden    Patient Literacy    Exercise Current Exercise Habits: Home exercise routine, Type of exercise: walking;Other - see comments(yard / garden ), Time (Minutes): 30, Frequency (Times/Week): 7, Weekly Exercise (Minutes/Week): 210, Intensity: Moderate, Exercise limited by: None identified  Diet Patient reports consuming 3 meals a day and 1 snack(s) a day Patient reports that his primary diet is: Regular Patient reports that she does have regular access to food.   Depression Screen PHQ 2/9 Scores 05/27/2018 03/04/2018 08/27/2017 04/20/2017 04/08/2017 12/18/2016 11/04/2016  PHQ - 2 Score 0 0 0 0 0 0 0  PHQ- 9 Score - - - - - - -     Fall Risk Fall Risk  05/27/2018 03/04/2018 08/27/2017 04/20/2017 04/08/2017  Falls in the past year? 0 0 No No No     Objective:  BERLIN MOKRY seemed alert and oriented and he participated appropriately during our telephone visit.  Blood Pressure Weight BMI  BP Readings from Last 3 Encounters:  05/27/18 130/75  03/10/18 139/76  03/04/18 134/73   Wt Readings from Last 3 Encounters:  05/27/18 240 lb (108.9 kg)  03/10/18 237 lb 12.8 oz (107.9 kg)  03/04/18 246 lb (111.6 kg)   BMI Readings from Last 1 Encounters:  05/27/18 33.47 kg/m    *Unable to obtain current vital signs, weight, and BMI due to telephone visit type  Hearing/Vision  . Bao did not seem to have difficulty with hearing/understanding during the telephone conversation . Reports that he has had a formal eye exam by an eye care professional within the past year . Reports that he has not had a formal hearing evaluation within the  past year *Unable to fully assess hearing and vision during telephone visit type  Cognitive Function: 6CIT Screen 05/27/2018  What Year? 0 points  What month? 0 points  What time? 0 points  Count back from 20 0 points  Months in reverse 0 points  Repeat phrase 0 points  Total Score 0    Normal Cognitive Function Screening: Yes (Normal:0-7, Significant for Dysfunction: >8)  Immunization & Health Maintenance Record Immunization History  Administered Date(s) Administered  . H1N1 11/10/2007  . Influenza, High Dose Seasonal PF 10/26/2017  . Influenza,inj,Quad PF,6+ Mos 11/03/2012, 10/24/2013, 10/16/2014, 10/16/2015, 10/20/2016  . Pneumococcal Conjugate-13 07/20/2014  . Pneumococcal Polysaccharide-23 07/30/2012  . Tdap 07/30/2012    Health Maintenance  Topic Date Due  . COLONOSCOPY  09/23/1996  . OPHTHALMOLOGY EXAM  06/16/2017  . FOOT EXAM  12/18/2017  . INFLUENZA VACCINE  08/14/2018  . HEMOGLOBIN A1C  09/02/2018  . TETANUS/TDAP  07/31/2022  . Hepatitis C Screening  Completed  . PNA vac Low Risk Adult  Completed       Assessment  This is a routine wellness examination for Jacob Rios.  Health Maintenance: Due or Overdue Health Maintenance Due  Topic Date Due  . COLONOSCOPY  09/23/1996  . OPHTHALMOLOGY EXAM  06/16/2017  . FOOT EXAM  12/18/2017    Jacob Rios does not need a referral for Community Assistance: Care Management:   no Social Work:    no Prescription Assistance:  no Nutrition/Diabetes Education:  no   Plan:  Personalized Goals Goals Addressed            This Visit's Progress   . Prevent falls       Stay active     . Weight (lb) < 220 lb (99.8 kg)  240 lb (108.9 kg)     Personalized Health Maintenance & Screening Recommendations  CXR -may be a good idea  Lung Cancer Screening Recommended: no (Low Dose CT Chest recommended if Age 18-80 years, 30 pack-year currently smoking OR have quit w/in past 15 years) Hepatitis C Screening  recommended: no HIV Screening recommended: no  Advanced Directives: Written information was not prepared per patient's request.  Referrals & Orders No orders of the defined types were placed in this encounter.   Follow-up Plan . Follow-up with [name], FNP as planned in June . Schedule eye appt yearly  . Continue follow up with Dr Jeffie Pollock    I have personally reviewed and noted the following in the patient's chart:   . Medical and social history . Use of alcohol, tobacco or illicit drugs  . Current medications and supplements . Functional ability and status . Nutritional status . Physical activity . Advanced directives . List of other physicians . Hospitalizations, surgeries, and ER visits in previous 12 months . Vitals . Screenings to include cognitive, depression, and falls . Referrals and appointments  In addition, I have reviewed and discussed with Jacob Rios certain preventive protocols, quality metrics, and best practice recommendations. A written personalized care plan for preventive services as well as general preventive health recommendations is available and can be mailed to the patient at his request.      Zannie Cove, LPN  0/16/5537   I have reviewed and agree with the above AWV documentation.   [name], FNP

## 2018-06-14 ENCOUNTER — Encounter: Payer: Self-pay | Admitting: Family Medicine

## 2018-06-14 ENCOUNTER — Other Ambulatory Visit: Payer: Self-pay

## 2018-06-14 ENCOUNTER — Ambulatory Visit (INDEPENDENT_AMBULATORY_CARE_PROVIDER_SITE_OTHER): Payer: Medicare Other | Admitting: Family Medicine

## 2018-06-14 DIAGNOSIS — J0140 Acute pansinusitis, unspecified: Secondary | ICD-10-CM

## 2018-06-14 MED ORDER — FLUTICASONE PROPIONATE 50 MCG/ACT NA SUSP: 1.0000 | Freq: Every day | NASAL | 0 refills | Status: DC

## 2018-06-14 MED ORDER — AMOXICILLIN-POT CLAVULANATE 875-125 MG PO TABS: 1.0000 | ORAL_TABLET | Freq: Two times a day (BID) | ORAL | 0 refills | Status: DC

## 2018-06-14 NOTE — Progress Notes (Signed)
Virtual Visit via telephone Note Due to COVID-19, visit is conducted virtually and was requested by patient. This visit type was conducted due to national recommendations for restrictions regarding the COVID-19 Pandemic (e.g. social distancing) in an effort to limit this patient's exposure and mitigate transmission in our community. All issues noted in this document were discussed and addressed.  A physical exam was not performed with this format.   I connected with Jacob Rios on 06/14/18 at 1035 by telephone and verified that I am speaking with the correct person using two identifiers. Jacob Rios is currently located at home and family is currently with them during visit. The provider, Monia Pouch, FNP is located in their office at time of visit.  I discussed the limitations, risks, security and privacy concerns of performing an evaluation and management service by telephone and the availability of in person appointments. I also discussed with the patient that there may be a patient responsible charge related to this service. The patient expressed understanding and agreed to proceed.  Subjective:  Patient ID: Jacob Rios, male    DOB: 11-Dec-1945, 73 y.o.   MRN: [id number]  Chief Complaint:  Sinus Problem   HPI: Jacob Rios is a 73 y.o. male presenting on 06/14/2018 for Sinus Problem   Pt reports 10 days of sinus pressure and drainage. States he has been using over the counter sudafed and sinus medications without relief of symptoms. Pt states he has thick postnasal drainage and facial pressure. He has not measured his temperature. He does have a sore throat with the drainage. No cough, shortness of breath, fatigue, or weakness. No chest pain or confusion.   Sinus Problem  This is a new problem. The current episode started 1 to 4 weeks ago. The problem has been gradually worsening since onset. His pain is at a severity of 4/10. The pain is mild. Associated symptoms include  chills, congestion, headaches, sinus pressure and a sore throat. Pertinent negatives include no coughing, diaphoresis, ear pain, hoarse voice, neck pain, shortness of breath, sneezing or swollen glands. Past treatments include oral decongestants and saline sprays. The treatment provided no relief.     Relevant past medical, surgical, family, and social history reviewed and updated as indicated.  Allergies and medications reviewed and updated.   Past Medical History:  Diagnosis Date  . Cancer [facility])    prostate cancer  . Coronary artery disease    3V CABG 2000. Last heart cath 2005, with 2 patent grafts  . Diabetes mellitus   . Hyperlipidemia   . Hypertension     Past Surgical History:  Procedure Laterality Date  . APPENDECTOMY    . CARDIAC CATHETERIZATION  2005  . CORONARY ARTERY BYPASS GRAFT  2000   3 vessel  . HERNIA REPAIR  1974  . PROSTATE BIOPSY  06/2017   x 4-5     Social History   Socioeconomic History  . Marital status: Married    Spouse name: Vaughan Basta   . Number of children: 3  . Years of education: Not on file  . Highest education level: Not on file  Occupational History  . Occupation: part-time Pharmacist, community    Comment: retired   Scientific laboratory technician  . Financial resource strain: Not on file  . Food insecurity:    Worry: Not on file    Inability: Not on file  . Transportation needs:    Medical: Not on file    Non-medical: Not on file  Tobacco  Use  . Smoking status: Former Smoker    Last attempt to quit: 01/13/1997    Years since quitting: 21.4  . Smokeless tobacco: Never Used  Substance and Sexual Activity  . Alcohol use: No  . Drug use: No  . Sexual activity: Not on file  Lifestyle  . Physical activity:    Days per week: Not on file    Minutes per session: Not on file  . Stress: Not on file  Relationships  . Social connections:    Talks on phone: Not on file    Gets together: Not on file    Attends religious service: Not on file    Active member of club or  organization: Not on file    Attends meetings of clubs or organizations: Not on file    Relationship status: Not on file  . Intimate partner violence:    Fear of current or ex partner: Not on file    Emotionally abused: Not on file    Physically abused: Not on file    Forced sexual activity: Not on file  Other Topics Concern  . Not on file  Social History Narrative  . Not on file    Outpatient Encounter Medications as of 06/14/2018  Medication Sig  . amoxicillin-clavulanate (AUGMENTIN) 875-125 MG tablet Take 1 tablet by mouth 2 (two) times daily for 7 days.  Marland Kitchen aspirin 81 MG tablet Take 81 mg by mouth daily.  Marland Kitchen atorvastatin (LIPITOR) 40 MG tablet Take 1 tablet (40 mg total) by mouth daily.  . fluticasone (FLONASE) 50 MCG/ACT nasal spray Place 1 spray into both nostrils daily for 14 days.  Marland Kitchen glipiZIDE (GLUCOTROL XL) 10 MG 24 hr tablet Take 1 tablet (10 mg total) by mouth daily.  Marland Kitchen glucose blood (ONETOUCH VERIO) test strip Test 1X per day and as needed  Dx 250.02  . Lancets (ONETOUCH ULTRASOFT) lancets Patient test 1X per day and prn  Dx 250.02  . lisinopril (PRINIVIL,ZESTRIL) 2.5 MG tablet Take 1 tablet (2.5 mg total) by mouth daily.  . metFORMIN (GLUCOPHAGE) 1000 MG tablet Take 1 tablet (1,000 mg total) by mouth 2 (two) times daily with a meal.  . metoprolol succinate (TOPROL-XL) 50 MG 24 hr tablet Take 1 tablet (50 mg total) by mouth daily.  . Omega-3 Fatty Acids (FISH OIL) 1000 MG CAPS Take 1 capsule by mouth daily.   Marland Kitchen omeprazole (PRILOSEC) 40 MG capsule Take 1 capsule (40 mg total) by mouth daily.  . vitamin C (ASCORBIC ACID) 500 MG tablet Take 500 mg by mouth daily.     No facility-administered encounter medications on file as of 06/14/2018.     Allergies  Allergen Reactions  . Morphine Swelling and Other (See Comments)    PT DOESNT REMEMBER /     Review of Systems  Constitutional: Positive for chills. Negative for activity change, appetite change, diaphoresis, fatigue, fever  and unexpected weight change.  HENT: Positive for congestion, postnasal drip, rhinorrhea, sinus pressure, sinus pain and sore throat. Negative for dental problem, drooling, ear discharge, ear pain, facial swelling, hearing loss, hoarse voice, mouth sores, nosebleeds, sneezing, tinnitus, trouble swallowing and voice change.   Respiratory: Negative for cough and shortness of breath.   Cardiovascular: Negative for chest pain, palpitations and leg swelling.  Gastrointestinal: Negative for abdominal pain.  Genitourinary: Negative for decreased urine volume.  Musculoskeletal: Negative for arthralgias, myalgias and neck pain.  Neurological: Positive for headaches. Negative for dizziness, tremors, seizures, syncope, facial asymmetry, speech difficulty,  weakness, light-headedness and numbness.  Psychiatric/Behavioral: Negative for confusion.  All other systems reviewed and are negative.        Observations/Objective: No vital signs or physical exam, this was a telephone or virtual health encounter.  Pt alert and oriented, answers all questions appropriately, and able to speak in full sentences.    Assessment and Plan: Alexandre was seen today for sinus problem.  Diagnoses and all orders for this visit:  Acute non-recurrent pansinusitis Due to ongoing symptoms and failed conservative therapy, will add Augmentin and Flonase. Symptomatic care discussed. Frequent saline nasal sprays, Mucinex, and increase water intake. Tylenol or motrin as needed for fever and pain control. Medications as prescribed. Report any new or worsening symptoms.  -     fluticasone (FLONASE) 50 MCG/ACT nasal spray; Place 1 spray into both nostrils daily for 14 days. -     amoxicillin-clavulanate (AUGMENTIN) 875-125 MG tablet; Take 1 tablet by mouth 2 (two) times daily for 7 days.     Follow Up Instructions: Return if symptoms worsen or fail to improve.    I discussed the assessment and treatment plan with the patient.  The patient was provided an opportunity to ask questions and all were answered. The patient agreed with the plan and demonstrated an understanding of the instructions.   The patient was advised to call back or seek an in-person evaluation if the symptoms worsen or if the condition fails to improve as anticipated.  The above assessment and management plan was discussed with the patient. The patient verbalized understanding of and has agreed to the management plan. Patient is aware to call the clinic if symptoms persist or worsen. Patient is aware when to return to the clinic for a follow-up visit. Patient educated on when it is appropriate to go to the emergency department.    I provided 15 minutes of non-face-to-face time during this encounter. The call started at 1035. The call ended at 1050. The other time was used for coordination of care.    Monia Pouch, FNP-C [facility] [address] [place], [place] [address] [phone]

## 2018-06-18 ENCOUNTER — Other Ambulatory Visit: Payer: Self-pay

## 2018-06-18 ENCOUNTER — Ambulatory Visit (INDEPENDENT_AMBULATORY_CARE_PROVIDER_SITE_OTHER): Payer: Medicare Other | Admitting: Nurse Practitioner

## 2018-06-18 ENCOUNTER — Encounter: Payer: Self-pay | Admitting: Nurse Practitioner

## 2018-06-18 DIAGNOSIS — E782 Mixed hyperlipidemia: Secondary | ICD-10-CM

## 2018-06-18 DIAGNOSIS — E1122 Type 2 diabetes mellitus with diabetic chronic kidney disease: Secondary | ICD-10-CM

## 2018-06-18 DIAGNOSIS — K219 Gastro-esophageal reflux disease without esophagitis: Secondary | ICD-10-CM

## 2018-06-18 DIAGNOSIS — I10 Essential (primary) hypertension: Secondary | ICD-10-CM

## 2018-06-18 DIAGNOSIS — N183 Chronic kidney disease, stage 3 (moderate): Secondary | ICD-10-CM

## 2018-06-18 DIAGNOSIS — I25810 Atherosclerosis of coronary artery bypass graft(s) without angina pectoris: Secondary | ICD-10-CM | POA: Diagnosis not present

## 2018-06-18 DIAGNOSIS — E119 Type 2 diabetes mellitus without complications: Secondary | ICD-10-CM

## 2018-06-18 DIAGNOSIS — Z6835 Body mass index (BMI) 35.0-35.9, adult: Secondary | ICD-10-CM

## 2018-06-18 MED ORDER — OMEPRAZOLE 40 MG PO CPDR: 40.0000 mg | DELAYED_RELEASE_CAPSULE | Freq: Every day | ORAL | 1 refills | Status: DC

## 2018-06-18 MED ORDER — LISINOPRIL 2.5 MG PO TABS: 2.5000 mg | ORAL_TABLET | Freq: Every day | ORAL | 1 refills | Status: DC

## 2018-06-18 MED ORDER — ATORVASTATIN CALCIUM 40 MG PO TABS: 40.0000 mg | ORAL_TABLET | Freq: Every day | ORAL | 1 refills | Status: DC

## 2018-06-18 MED ORDER — METFORMIN HCL 1000 MG PO TABS: 1000.0000 mg | ORAL_TABLET | Freq: Two times a day (BID) | ORAL | 1 refills | Status: DC

## 2018-06-18 MED ORDER — GLIPIZIDE ER 10 MG PO TB24: 10.0000 mg | ORAL_TABLET | Freq: Every day | ORAL | 1 refills | Status: DC

## 2018-06-18 MED ORDER — METOPROLOL SUCCINATE ER 50 MG PO TB24: 50.0000 mg | ORAL_TABLET | Freq: Every day | ORAL | 1 refills | Status: DC

## 2018-06-18 NOTE — Progress Notes (Signed)
Virtual Visit via telephone Note  I connected with@ on 06/18/18 at 8:15 AM by video and verified that I am speaking with the correct person using two identifiers. Jacob Rios is currently located at home and his wife is currently with her during visit. The provider, [name], FNP is located in their office at time of visit.  I discussed the limitations, risks, security and privacy concerns of performing an evaluation and management service by telephone and the availability of in person appointments. I also discussed with the patient that there may be a patient responsible charge related to this service. The patient expressed understanding and agreed to proceed.   History and Present Illness:   Chief Complaint: Medical Management of Chronic Issues    HPI:  1. Essential hypertension No c/o chest pain, sob or headache. Does not check blood pressure at home. BP Readings from Last 3 Encounters:  05/27/18 130/75  03/10/18 139/76  03/04/18 134/73     2. 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 According to office note no changes were made to plan of care. He is to follow up in January 2021  3. CKD stage 3 due to type 2 diabetes mellitus (HCC) Last creatine was 1.31.  4. Mixed hyperlipidemia He does not really watch his diet. Does very little exercise, but does stay active.  5. Type 2 diabetes mellitus without complication, without long-term current use of insulin (HCC) Last HGBA1c was 6.6%.. fasting blood sugars are running around 120-140. denies any low blood suagrs  6. Gastroesophageal reflux disease without esophagitis Is on omeprazole daily and works well to keep symptoms under control.  7. BMI 35.0-35.9,adult No recent weight changes    Outpatient Encounter Medications as of 06/18/2018  Medication Sig  . amoxicillin-clavulanate (AUGMENTIN) 875-125 MG tablet Take 1 tablet by mouth 2 (two) times  daily for 7 days.  Marland Kitchen aspirin 81 MG tablet Take 81 mg by mouth daily.  Marland Kitchen atorvastatin (LIPITOR) 40 MG tablet Take 1 tablet (40 mg total) by mouth daily.  . fluticasone (FLONASE) 50 MCG/ACT nasal spray Place 1 spray into both nostrils daily for 14 days.  Marland Kitchen glipiZIDE (GLUCOTROL XL) 10 MG 24 hr tablet Take 1 tablet (10 mg total) by mouth daily.  Marland Kitchen glucose blood (ONETOUCH VERIO) test strip Test 1X per day and as needed  Dx 250.02  . Lancets (ONETOUCH ULTRASOFT) lancets Patient test 1X per day and prn  Dx 250.02  . lisinopril (PRINIVIL,ZESTRIL) 2.5 MG tablet Take 1 tablet (2.5 mg total) by mouth daily.  . metFORMIN (GLUCOPHAGE) 1000 MG tablet Take 1 tablet (1,000 mg total) by mouth 2 (two) times daily with a meal.  . metoprolol succinate (TOPROL-XL) 50 MG 24 hr tablet Take 1 tablet (50 mg total) by mouth daily.  . Omega-3 Fatty Acids (FISH OIL) 1000 MG CAPS Take 1 capsule by mouth daily.   Marland Kitchen omeprazole (PRILOSEC) 40 MG capsule Take 1 capsule (40 mg total) by mouth daily.  . vitamin C (ASCORBIC ACID) 500 MG tablet Take 500 mg by mouth daily.         New complaints: None today  Social history: Lives with wife- they like to travel     Review of Systems  Constitutional: Negative for diaphoresis and weight loss.  Eyes: Negative for blurred vision, double vision and pain.  Respiratory: Negative for shortness of breath.   Cardiovascular: Negative for chest pain, palpitations, orthopnea and leg swelling.  Gastrointestinal: Negative for abdominal pain.  Skin: Negative for rash.  Neurological: Negative for dizziness, sensory change, loss of consciousness, weakness and headaches.  Endo/Heme/Allergies: Negative for polydipsia. Does not bruise/bleed easily.  Psychiatric/Behavioral: Negative for memory loss. The patient does not have insomnia.   All other systems reviewed and are negative.      Observations/Objective: Alert and oriented Answers all questions appropriately  Assessment and  Plan: Jacob Rios comes in today with chief complaint of Medical Management of Chronic Issues   Diagnosis and orders addressed:  1. Essential hypertension Low sodium diet - metoprolol succinate (TOPROL-XL) 50 MG 24 hr tablet; Take 1 tablet (50 mg total) by mouth daily.  Dispense: 90 tablet; Refill: 1 - lisinopril (ZESTRIL) 2.5 MG tablet; Take 1 tablet (2.5 mg total) by mouth daily.  Dispense: 90 tablet; Refill: 1  2. 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  3. CKD stage 3 due to type 2 diabetes mellitus ([place]) Will repeat labs at next visit  4. Mixed hyperlipidemia Low fat diet - atorvastatin (LIPITOR) 40 MG tablet; Take 1 tablet (40 mg total) by mouth daily.  Dispense: 90 tablet; Refill: 1  5. Type 2 diabetes mellitus without complication, without long-term current use of insulin (HCC) Continue to watch carbs in diet - glipiZIDE (GLUCOTROL XL) 10 MG 24 hr tablet; Take 1 tablet (10 mg total) by mouth daily.  Dispense: 90 tablet; Refill: 1 - metFORMIN (GLUCOPHAGE) 1000 MG tablet; Take 1 tablet (1,000 mg total) by mouth 2 (two) times daily with a meal.  Dispense: 180 tablet; Refill: 1  6. Gastroesophageal reflux disease without esophagitis Avoid spicy foods Do not eat 2 hours prior to bedtime - omeprazole (PRILOSEC) 40 MG capsule; Take 1 capsule (40 mg total) by mouth daily.  Dispense: 90 capsule; Refill: 1  7. BMI 35.0-35.9,adult Discussed diet and exercise for person with BMI >25 Will recheck weight in 3-6 months    Labs pending Health Maintenance reviewed Diet and exercise encouraged  Follow up plan: 3 months    I discussed the assessment and treatment plan with the patient. The patient was provided an opportunity to ask questions and all were answered. The patient agreed with the plan and demonstrated an understanding of the instructions.   The patient was advised to call back or seek an  in-person evaluation if the symptoms worsen or if the condition fails to improve as anticipated.  The above assessment and management plan was discussed with the patient. The patient verbalized understanding of and has agreed to the management plan. Patient is aware to call the clinic if symptoms persist or worsen. Patient is aware when to return to the clinic for a follow-up visit. Patient educated on when it is appropriate to go to the emergency department.   Time call ended: 8:32 I provided 17 minutes of face-to-face time during this encounter.    [name], FNP

## 2018-07-12 ENCOUNTER — Encounter: Payer: Self-pay | Admitting: Family Medicine

## 2018-07-12 ENCOUNTER — Other Ambulatory Visit: Payer: Self-pay

## 2018-07-12 ENCOUNTER — Ambulatory Visit (INDEPENDENT_AMBULATORY_CARE_PROVIDER_SITE_OTHER): Payer: Medicare Other | Admitting: Family Medicine

## 2018-07-12 DIAGNOSIS — R300 Dysuria: Secondary | ICD-10-CM | POA: Diagnosis not present

## 2018-07-12 DIAGNOSIS — M542 Cervicalgia: Secondary | ICD-10-CM

## 2018-07-12 MED ORDER — SULFAMETHOXAZOLE-TRIMETHOPRIM 800-160 MG PO TABS: 1.0000 | ORAL_TABLET | Freq: Two times a day (BID) | ORAL | 0 refills | Status: DC

## 2018-07-12 NOTE — Progress Notes (Signed)
Subjective:    Patient ID: Jacob Rios, male    DOB: [birth year], 73 y.o.   MRN: [id number]   HPI: Jacob Rios is a 73 y.o. male presenting for burning with urination starting this morning. Denies frequency and urgency. Denies fever . No flank pain. No nausea, vomiting. Pulled a muscle in his neck 4-5 days ago. Taking ibuprofen a lot.   Depression screen [facility] 2/9 06/18/2018 05/27/2018 03/04/2018 08/27/2017 04/20/2017  Decreased Interest 0 0 0 0 0  Down, Depressed, Hopeless 0 0 0 0 0  PHQ - 2 Score 0 0 0 0 0  Altered sleeping - - - - -  Tired, decreased energy - - - - -  Change in appetite - - - - -  Feeling bad or failure about yourself  - - - - -  Trouble concentrating - - - - -  Moving slowly or fidgety/restless - - - - -  Suicidal thoughts - - - - -  PHQ-9 Score - - - - -     Relevant past medical, surgical, family and social history reviewed and updated as indicated.  Interim medical history since our last visit reviewed. Allergies and medications reviewed and updated.  ROS:  Review of Systems  Constitutional: Negative for fever.  Respiratory: Negative for shortness of breath.   Cardiovascular: Negative for chest pain.  Musculoskeletal: Negative for arthralgias.  Skin: Negative for rash.     Social History   Tobacco Use  Smoking Status Former Smoker  . Quit date: 01/13/1997  . Years since quitting: 21.5  Smokeless Tobacco Never Used       Objective:     Wt Readings from Last 3 Encounters:  05/27/18 240 lb (108.9 kg)  03/10/18 237 lb 12.8 oz (107.9 kg)  03/04/18 246 lb (111.6 kg)     Exam deferred. Pt. Harboring due to COVID 19. Phone visit performed.   Assessment & Plan:   1. Neck pain   2. Dysuria     Meds ordered this encounter  Medications  . sulfamethoxazole-trimethoprim (BACTRIM DS) 800-160 MG tablet    Sig: Take 1 tablet by mouth 2 (two) times daily.    Dispense:  28 tablet    Refill:  0    Try drinking more water _ 64 oz. A day. That  may take care of the problem without the need for the antibiotic. If new sx occur, or if the dysuria persists, go ahead and take the full two weeks of medication.  Diagnoses and all orders for this visit:  Neck pain  Dysuria  Other orders -     sulfamethoxazole-trimethoprim (BACTRIM DS) 800-160 MG tablet; Take 1 tablet by mouth 2 (two) times daily.    Virtual Visit via telephone Note  I discussed the limitations, risks, security and privacy concerns of performing an evaluation and management service by telephone and the availability of in person appointments. The patient was identified with two identifiers. Pt.expressed understanding and agreed to proceed. Pt. Is at home. Dr. Livia Snellen is in his office.  Follow Up Instructions:   I discussed the assessment and treatment plan with the patient. The patient was provided an opportunity to ask questions and all were answered. The patient agreed with the plan and demonstrated an understanding of the instructions.   The patient was advised to call back or seek an in-person evaluation if the symptoms worsen or if the condition fails to improve as anticipated.   Total minutes including chart  review and phone contact time: 6   Follow up plan: Return if symptoms worsen or fail to improve.  Claretta Fraise, MD [place]

## 2018-07-15 ENCOUNTER — Encounter (HOSPITAL_COMMUNITY): Payer: Self-pay | Admitting: Emergency Medicine

## 2018-07-15 ENCOUNTER — Other Ambulatory Visit: Payer: Self-pay

## 2018-07-15 ENCOUNTER — Emergency Department (HOSPITAL_COMMUNITY)
Admission: EM | Admit: 2018-07-15 | Discharge: 2018-07-15 | Disposition: A | Discharge status: Home / Self Care | Payer: Medicare Other | Attending: Emergency Medicine | Admitting: Emergency Medicine

## 2018-07-15 DIAGNOSIS — I10 Essential (primary) hypertension: Secondary | ICD-10-CM | POA: Insufficient documentation

## 2018-07-15 DIAGNOSIS — Z8546 Personal history of malignant neoplasm of prostate: Secondary | ICD-10-CM | POA: Insufficient documentation

## 2018-07-15 DIAGNOSIS — E119 Type 2 diabetes mellitus without complications: Secondary | ICD-10-CM | POA: Diagnosis not present

## 2018-07-15 DIAGNOSIS — Z87891 Personal history of nicotine dependence: Secondary | ICD-10-CM | POA: Insufficient documentation

## 2018-07-15 DIAGNOSIS — N3000 Acute cystitis without hematuria: Secondary | ICD-10-CM | POA: Diagnosis not present

## 2018-07-15 DIAGNOSIS — R39198 Other difficulties with micturition: Secondary | ICD-10-CM | POA: Diagnosis present

## 2018-07-15 DIAGNOSIS — I2510 Atherosclerotic heart disease of native coronary artery without angina pectoris: Secondary | ICD-10-CM | POA: Diagnosis not present

## 2018-07-15 DIAGNOSIS — Z79899 Other long term (current) drug therapy: Secondary | ICD-10-CM | POA: Diagnosis not present

## 2018-07-15 DIAGNOSIS — Z7984 Long term (current) use of oral hypoglycemic drugs: Secondary | ICD-10-CM | POA: Diagnosis not present

## 2018-07-15 DIAGNOSIS — Z7982 Long term (current) use of aspirin: Secondary | ICD-10-CM | POA: Diagnosis not present

## 2018-07-15 LAB — URINALYSIS, ROUTINE W REFLEX MICROSCOPIC
Bilirubin Urine: NEGATIVE
Glucose, UA: NEGATIVE mg/dL
Hgb urine dipstick: NEGATIVE
Ketones, ur: NEGATIVE mg/dL
Nitrite: NEGATIVE
Protein, ur: 100 mg/dL — AB
Specific Gravity, Urine: 1.026 (ref 1.005–1.030)
WBC, UA: >50 WBC/hpf — ABNORMAL HIGH (ref 0–5)
pH: 5 (ref 5.0–8.0)

## 2018-07-15 NOTE — ED Notes (Signed)
Bladder scanned noted 38 ml.

## 2018-07-15 NOTE — Discharge Instructions (Addendum)
Take Bactrim as prescribed by your doctor. Return to ER if unable to void, otherwise follow up with your doctor.

## 2018-07-15 NOTE — ED Triage Notes (Signed)
Pt has prostate cancer (not getting treatments) and for past 3 days or so, having urinary hesitance and slow flow. Denies retention or pains.

## 2018-07-15 NOTE — ED Provider Notes (Signed)
[facility] DEPT Provider Note   CSN: [id number] Arrival date & time: 07/15/18  1031    History   Chief Complaint Chief Complaint  Patient presents with  . urinary problems  . Urinary Retention    HPI Jacob Rios is a 73 y.o. male.     73 year old male with history of prostate cancer not currently undergoing any treatment presents with complaint of urinary difficulty.  Patient states that for the past 4 days he has had difficulty initiating and maintaining a urinary stream.  Patient reports dysuria with onset of symptoms 4 days ago, called his PCP who called in a medication called Braxtin which patient thought might be for urinary tract infection, he is taking this however continues to still have problems maintaining urinary stream.  Denies abdominal pain, changes in bowel habits, hematuria.  No other complaints or concerns.     Past Medical History:  Diagnosis Date  . Cancer [facility])    prostate cancer  . Coronary artery disease    3V CABG 2000. Last heart cath 2005, with 2 patent grafts  . Diabetes mellitus   . Hyperlipidemia   . Hypertension     Patient Active Problem List   Diagnosis Date Noted  . CKD stage 3 due to type 2 diabetes mellitus ([place]) 08/18/2016  . Gastroesophageal reflux disease without esophagitis 02/19/2015  . Hyperlipemia 07/20/2014  . BMI 35.0-35.9,adult 07/20/2014  . Diabetes mellitus ([place]) 10/21/2010  . Essential hypertension 10/26/2008  . CAD, AUTOLOGOUS BYPASS GRAFT 10/26/2008    Past Surgical History:  Procedure Laterality Date  . APPENDECTOMY    . CARDIAC CATHETERIZATION  2005  . CORONARY ARTERY BYPASS GRAFT  2000   3 vessel  . HERNIA REPAIR  1974  . PROSTATE BIOPSY  06/2017   x 4-5         Home Medications    Prior to Admission medications   Medication Sig Start Date End Date Taking? Authorizing Provider  acetaminophen (TYLENOL) 500 MG tablet Take 1,000 mg by mouth every 6 (six) hours as needed  for mild pain.   Yes [provider]  aspirin 81 MG tablet Take 81 mg by mouth daily.   Yes [provider]  atorvastatin (LIPITOR) 40 MG tablet Take 1 tablet (40 mg total) by mouth daily. Patient taking differently: Take 40 mg by mouth at bedtime.  06/18/18  Yes Martin, [name], FNP  glipiZIDE (GLUCOTROL XL) 10 MG 24 hr tablet Take 1 tablet (10 mg total) by mouth daily. 06/18/18  Yes Hassell Done, [name], FNP  glucose blood (ONETOUCH VERIO) test strip Test 1X per day and as needed  Dx 250.02 07/01/13  Yes Hassell Done, [name], FNP  Lancets [facility] ULTRASOFT) lancets Patient test 1X per day and prn  Dx 250.02 09/01/12  Yes Hassell Done, [name], FNP  lisinopril (ZESTRIL) 2.5 MG tablet Take 1 tablet (2.5 mg total) by mouth daily. 06/18/18  Yes Hassell Done, [name], FNP  metFORMIN (GLUCOPHAGE) 1000 MG tablet Take 1 tablet (1,000 mg total) by mouth 2 (two) times daily with a meal. Patient taking differently: Take 500 mg by mouth 2 (two) times daily with a meal.  06/18/18  Yes Hassell Done, [name], FNP  metoprolol succinate (TOPROL-XL) 50 MG 24 hr tablet Take 1 tablet (50 mg total) by mouth daily. 06/18/18  Yes Martin, [name], FNP  Omega-3 Fatty Acids (FISH OIL) 1000 MG CAPS Take 1 capsule by mouth daily.    Yes [provider]  omeprazole (PRILOSEC) 40 MG capsule Take  1 capsule (40 mg total) by mouth daily. 06/18/18  Yes Martin, [name], FNP  sulfamethoxazole-trimethoprim (BACTRIM DS) 800-160 MG tablet Take 1 tablet by mouth 2 (two) times daily. 07/12/18  Yes Claretta Fraise, MD  vitamin C (ASCORBIC ACID) 500 MG tablet Take 500 mg by mouth daily.     Yes [provider]  fluticasone (FLONASE) 50 MCG/ACT nasal spray Place 1 spray into both nostrils daily for 14 days. Patient not taking: Reported on 07/15/2018 06/14/18 06/28/18  Baruch Gouty, FNP    Family History Family History  Problem Relation Age of Onset  . Lung cancer Mother   . Cancer Mother   .  Dementia Father   . Hyperlipidemia Father   . Congestive Heart Failure Father   . Post-traumatic stress disorder Son   . Alcohol abuse Maternal Grandfather   . Cancer Maternal Grandfather 90       stomach cancer   . Heart attack Neg Hx   . Stroke Neg Hx     Social History Social History   Tobacco Use  . Smoking status: Former Smoker    Quit date: 01/13/1997    Years since quitting: 21.5  . Smokeless tobacco: Never Used  Substance Use Topics  . Alcohol use: No  . Drug use: No     Allergies   Morphine   Review of Systems Review of Systems  Constitutional: Negative for fever.  Gastrointestinal: Negative for abdominal pain, constipation, diarrhea, nausea and vomiting.  Genitourinary: Positive for difficulty urinating and dysuria. Negative for decreased urine volume, flank pain and hematuria.  Skin: Negative for rash and wound.  Allergic/Immunologic: Positive for immunocompromised state.  Neurological: Negative for dizziness and weakness.  All other systems reviewed and are negative.    Physical Exam Updated Vital Signs BP 117/68   Pulse 74   Temp 98.5 F (36.9 C)   Resp 20   SpO2 98%   Physical Exam Vitals signs and nursing note reviewed.  Constitutional:      General: He is not in acute distress.    Appearance: He is well-developed. He is not diaphoretic.  HENT:     Head: Normocephalic and atraumatic.  Cardiovascular:     Rate and Rhythm: Normal rate and regular rhythm.     Pulses: Normal pulses.     Heart sounds: Normal heart sounds.  Pulmonary:     Effort: Pulmonary effort is normal.     Breath sounds: Normal breath sounds.  Abdominal:     Tenderness: There is no abdominal tenderness. There is no right CVA tenderness or left CVA tenderness.  Skin:    General: Skin is warm and dry.     Findings: No erythema or rash.  Neurological:     Mental Status: He is alert and oriented to person, place, and time.  Psychiatric:        Behavior: Behavior normal.       ED Treatments / Results  Labs (all labs ordered are listed, but only abnormal results are displayed) Labs Reviewed  URINALYSIS, ROUTINE W REFLEX MICROSCOPIC - Abnormal; Notable for the following components:      Result Value   Color, Urine AMBER (*)    APPearance CLOUDY (*)    Protein, ur 100 (*)    Leukocytes,Ua LARGE (*)    WBC, UA >50 (*)    Bacteria, UA FEW (*)    Non Squamous Epithelial 6-10 (*)    All other components within normal limits  URINE CULTURE  EKG None  Radiology No results found.  Procedures Procedures (including critical care time)  Medications Ordered in ED Medications - No data to display   Initial Impression / Assessment and Plan / ED Course  I have reviewed the triage vital signs and the nursing notes.  Pertinent labs & imaging results that were available during my care of the patient were reviewed by me and considered in my medical decision making (see chart for details).  Clinical Course as of Jul 15 1307  Thu Jul 15, 2546  [id number] 73 year old male presents with complaint of dysuria and difficulty voiding for the past 4 days.  Patient called his PCP and was started on Bactrim, dysuria has resolved however continues to have difficulty maintaining stream.  On exam abdomen is soft and nontender, no CVA tenderness.  Patient has a history of prostate cancer, monitored by Dr. Jeffie Pollock, not currently undergoing any treatment.  Urinalysis appears contaminated with 6-10 epithelial cells, few bacteria, greater than 50 white cells, large leukocytes and 100 protein.  Recommend patient continue on the Bactrim prescribed by his provider, return to ER if unable to void, follow-up with PCP.  Urine culture sent.   [LM]    Clinical Course User Index [LM] Tacy Learn, PA-C      Final Clinical Impressions(s) / ED Diagnoses   Final diagnoses:  Acute cystitis without hematuria    ED Discharge Orders    None       Tacy Learn, PA-C 07/15/18 [place], [place], DO 07/15/18 1604

## 2018-07-16 LAB — URINE CULTURE

## 2018-08-03 ENCOUNTER — Other Ambulatory Visit: Payer: Self-pay

## 2018-08-03 ENCOUNTER — Encounter: Payer: Self-pay | Admitting: Nurse Practitioner

## 2018-08-03 ENCOUNTER — Ambulatory Visit (INDEPENDENT_AMBULATORY_CARE_PROVIDER_SITE_OTHER): Payer: Medicare Other | Admitting: Nurse Practitioner

## 2018-08-03 DIAGNOSIS — R531 Weakness: Secondary | ICD-10-CM

## 2018-08-03 NOTE — Progress Notes (Signed)
   Virtual Visit via telephone Note  I connected with Jacob Rios on 08/03/18 at 4:15 by telephone and verified that I am speaking with the correct person using two identifiers. Jacob Rios is currently located at home and no one is currently with her during visit. The provider, [name], FNP is located in their office at time of visit.  I discussed the limitations, risks, security and privacy concerns of performing an evaluation and management service by telephone and the availability of in person appointments. I also discussed with the patient that there may be a patient responsible charge related to this service. The patient expressed understanding and agreed to proceed.   History and Present Illness:   Chief Complaint: Fatigue   HPI Patient calls in stating that he feels like his blood pressure is low. He says when he is outside walking he feels like his legs get weak. Is he stops and rest it gets better. He recently started on tamulosin for his bladder and it started after he started taking that. Blood pressure running 16-109 systolic.   Review of Systems  Constitutional: Positive for malaise/fatigue.  Respiratory: Negative.   Cardiovascular: Negative.  Negative for palpitations and leg swelling.  Neurological: Positive for weakness (legs when walking). Negative for dizziness and headaches.  Psychiatric/Behavioral: Negative.   All other systems reviewed and are negative.    Observations/Objective: Alert and oriented - answers all questions appropriately No distress  Assessment and Plan: Jacob Rios in today with chief complaint of Fatigue   1. Weakness Stop tamulosin Force fluids If no better in 2 days - let me know.   Follow Up Instructions: 2 days if no better    I discussed the assessment and treatment plan with the patient. The patient was provided an opportunity to ask questions and all were answered. The patient agreed with the plan and  demonstrated an understanding of the instructions.   The patient was advised to call back or seek an in-person evaluation if the symptoms worsen or if the condition fails to improve as anticipated.  The above assessment and management plan was discussed with the patient. The patient verbalized understanding of and has agreed to the management plan. Patient is aware to call the clinic if symptoms persist or worsen. Patient is aware when to return to the clinic for a follow-up visit. Patient educated on when it is appropriate to go to the emergency department.   Time call ended:  4:25 I provided 10 minutes of non-face-to-face time during this encounter.    [name], FNP

## 2018-08-04 ENCOUNTER — Ambulatory Visit: Payer: Medicare Other | Admitting: Family Medicine

## 2018-08-04 ENCOUNTER — Telehealth: Payer: Self-pay | Admitting: Nurse Practitioner

## 2018-08-04 NOTE — Telephone Encounter (Signed)
Per MMM - appt made for tomorrow

## 2018-08-05 ENCOUNTER — Encounter: Payer: Self-pay | Admitting: Nurse Practitioner

## 2018-08-05 ENCOUNTER — Ambulatory Visit: Payer: Medicare Other | Admitting: Nurse Practitioner

## 2018-08-05 ENCOUNTER — Other Ambulatory Visit: Payer: Self-pay

## 2018-08-05 VITALS — BP 116/70 | HR 67 | Temp 96.4°F | Ht 71.0 in | Wt 240.0 lb

## 2018-08-05 DIAGNOSIS — R300 Dysuria: Secondary | ICD-10-CM | POA: Diagnosis not present

## 2018-08-05 DIAGNOSIS — E119 Type 2 diabetes mellitus without complications: Secondary | ICD-10-CM | POA: Diagnosis not present

## 2018-08-05 DIAGNOSIS — I10 Essential (primary) hypertension: Secondary | ICD-10-CM

## 2018-08-05 DIAGNOSIS — E782 Mixed hyperlipidemia: Secondary | ICD-10-CM | POA: Diagnosis not present

## 2018-08-05 DIAGNOSIS — R531 Weakness: Secondary | ICD-10-CM

## 2018-08-05 LAB — CMP14+EGFR
ALT: 11 IU/L (ref 0–44)
AST: 9 IU/L (ref 0–40)
Albumin/Globulin Ratio: 1.5 (ref 1.2–2.2)
Albumin: 4 g/dL (ref 3.7–4.7)
Alkaline Phosphatase: 102 IU/L (ref 39–117)
BUN/Creatinine Ratio: 13 (ref 10–24)
BUN: 22 mg/dL (ref 8–27)
Bilirubin Total: 0.3 mg/dL (ref 0.0–1.2)
CO2: 20 mmol/L (ref 20–29)
Calcium: 9.3 mg/dL (ref 8.6–10.2)
Chloride: 104 mmol/L (ref 96–106)
Creatinine, Ser: 1.64 mg/dL — ABNORMAL HIGH (ref 0.76–1.27)
GFR calc Af Amer: 48 mL/min/{1.73_m2} — ABNORMAL LOW (ref >59)
GFR calc non Af Amer: 41 mL/min/{1.73_m2} — ABNORMAL LOW (ref >59)
Globulin, Total: 2.7 g/dL (ref 1.5–4.5)
Glucose: 93 mg/dL (ref 65–99)
Potassium: 5.4 mmol/L — ABNORMAL HIGH (ref 3.5–5.2)
Sodium: 140 mmol/L (ref 134–144)
Total Protein: 6.7 g/dL (ref 6.0–8.5)

## 2018-08-05 LAB — CBC WITH DIFFERENTIAL/PLATELET
Basophils Absolute: 0.1 10*3/uL (ref 0.0–0.2)
Basos: 1 %
EOS (ABSOLUTE): 0.2 10*3/uL (ref 0.0–0.4)
Eos: 3 %
Hematocrit: 34.6 % — ABNORMAL LOW (ref 37.5–51.0)
Hemoglobin: 11.8 g/dL — ABNORMAL LOW (ref 13.0–17.7)
Immature Grans (Abs): 0 10*3/uL (ref 0.0–0.1)
Immature Granulocytes: 0 %
Lymphocytes Absolute: 1.1 10*3/uL (ref 0.7–3.1)
Lymphs: 24 %
MCH: 27.7 pg (ref 26.6–33.0)
MCHC: 34.1 g/dL (ref 31.5–35.7)
MCV: 81 fL (ref 79–97)
Monocytes Absolute: 0.3 10*3/uL (ref 0.1–0.9)
Monocytes: 6 %
Neutrophils Absolute: 3.1 10*3/uL (ref 1.4–7.0)
Neutrophils: 66 %
Platelets: 242 10*3/uL (ref 150–450)
RBC: 4.26 x10E6/uL (ref 4.14–5.80)
RDW: 15.1 % (ref 11.6–15.4)
WBC: 4.8 10*3/uL (ref 3.4–10.8)

## 2018-08-05 LAB — URINALYSIS, COMPLETE
Bilirubin, UA: NEGATIVE
Glucose, UA: NEGATIVE
Leukocytes,UA: NEGATIVE
Nitrite, UA: NEGATIVE
Protein,UA: NEGATIVE
RBC, UA: NEGATIVE
Specific Gravity, UA: 1.025 (ref 1.005–1.030)
Urobilinogen, Ur: 0.2 mg/dL (ref 0.2–1.0)
pH, UA: 5.5 (ref 5.0–7.5)

## 2018-08-05 LAB — BAYER DCA HB A1C WAIVED: HB A1C (BAYER DCA - WAIVED): 5.7 % (ref <7.0)

## 2018-08-05 LAB — LIPID PANEL
Chol/HDL Ratio: 3.7 ratio (ref 0.0–5.0)
Cholesterol, Total: 108 mg/dL (ref 100–199)
HDL: 29 mg/dL — ABNORMAL LOW (ref >39)
LDL Calculated: 60 mg/dL (ref 0–99)
Triglycerides: 97 mg/dL (ref 0–149)
VLDL Cholesterol Cal: 19 mg/dL (ref 5–40)

## 2018-08-05 LAB — MICROSCOPIC EXAMINATION
Bacteria, UA: NONE SEEN
Epithelial Cells (non renal): NONE SEEN /hpf (ref 0–10)
RBC, Urine: NONE SEEN /hpf (ref 0–2)
Renal Epithel, UA: NONE SEEN /hpf
WBC, UA: NONE SEEN /hpf (ref 0–5)

## 2018-08-05 NOTE — Patient Instructions (Signed)

## 2018-08-05 NOTE — Progress Notes (Signed)
Subjective:    Patient ID: Jacob Rios, male    DOB: 26-Jun-1945, 73 y.o.   MRN: [id number]   Chief Complaint: Low BP   HPI Patient comes in today c/o low blood pressure and weakness. At home was in the 69'C systolic. He was put on tamulosin and that is when blood pressure went down. He stopped meds 2 days ago and blood pressure is better. He also wanted to make sure his blood sugars were fine. He has  Not had blood work done in [employer] because he has had several telehealth visits due to covid. He says that he had a uti several weeks go and was put on bactrim and he only took it for 7 days. He says he is just feeling weak.   Review of Systems  Constitutional: Positive for fatigue. Negative for activity change and appetite change.  HENT: Negative.   Eyes: Negative for pain.  Respiratory: Negative for shortness of breath.   Cardiovascular: Negative for chest pain, palpitations and leg swelling.  Gastrointestinal: Negative for abdominal pain.  Endocrine: Negative for polydipsia.  Genitourinary: Negative.   Skin: Negative for rash.  Neurological: Positive for weakness. Negative for dizziness and headaches.  Hematological: Does not bruise/bleed easily.  Psychiatric/Behavioral: Negative.   All other systems reviewed and are negative.      Objective:   Physical Exam Vitals signs and nursing note reviewed.  Constitutional:      Appearance: Normal appearance. He is well-developed.  HENT:     Head: Normocephalic.     Nose: Nose normal.  Eyes:     Pupils: Pupils are equal, round, and reactive to light.  Neck:     Musculoskeletal: Normal range of motion and neck supple.     Thyroid: No thyroid mass or thyromegaly.     Vascular: No carotid bruit or JVD.     Trachea: Phonation normal.  Cardiovascular:     Rate and Rhythm: Normal rate and regular rhythm.  Pulmonary:     Effort: Pulmonary effort is normal. No respiratory distress.     Breath sounds: Normal breath sounds.   Abdominal:     General: Bowel sounds are normal.     Palpations: Abdomen is soft.     Tenderness: There is no abdominal tenderness.  Musculoskeletal: Normal range of motion.  Lymphadenopathy:     Cervical: No cervical adenopathy.  Skin:    General: Skin is warm and dry.  Neurological:     Mental Status: He is alert and oriented to person, place, and time.  Psychiatric:        Behavior: Behavior normal.        Thought Content: Thought content normal.        Judgment: Judgment normal.    BP 116/70   Pulse 67   Temp (!) 96.4 F (35.8 C) (Oral)   Ht '5\' 11"'  (1.803 m)   Wt 240 lb (108.9 kg)   BMI 33.47 kg/m   hgba1c 5.7% Urine clear       Assessment & Plan:  TAYTON DECAIRE comes in today with chief complaint of Low BP   Diagnosis and orders addressed:  1. Dysuria Force fluids - Urinalysis, Complete  2. Essential hypertension Low sodium diet - CMP14+EGFR  3. Mixed hyperlipidemia Low fat diet - Lipid panel  4. Type 2 diabetes mellitus without complication, without long-term current use of insulin (HCC) Continue to wtach carbs in diet - Bayer DCA Hb A1c Waived  5. Weakness Labs  pending - CBC with Differential/Platelet   Labs pending Health Maintenance reviewed Diet and exercise encouraged  Follow up plan: 3 months   [place], FNP

## 2018-08-11 ENCOUNTER — Telehealth: Payer: Self-pay | Admitting: Nurse Practitioner

## 2018-08-11 NOTE — Telephone Encounter (Signed)
Aware of results and to begin iron medicine.  Call us if his fatigue does not improve for recheck.

## 2018-08-17 ENCOUNTER — Telehealth: Payer: Self-pay | Admitting: Nurse Practitioner

## 2018-08-17 MED ORDER — CYCLOBENZAPRINE HCL 10 MG PO TABS: 10.0000 mg | ORAL_TABLET | Freq: Three times a day (TID) | ORAL | 1 refills | Status: DC | PRN

## 2018-08-17 NOTE — Telephone Encounter (Signed)
What symptoms do you have? Muscles spasm in leg and back. Pt just had a ov with MMM 08/05/2018 pt states that he mentioned this to her on that day  How long have you been sick? Off and on  Have you been seen for this problem? No pt denied ov   If your provider decides to give you a prescription, which pharmacy would you like for it to be sent to? [facility]--can cyclobenzaprine (FLEXERIL) 10 MG tablet  Be call in.   Patient informed that this information will be sent to the clinical staff for review and that they should receive a follow up call.

## 2018-08-17 NOTE — Telephone Encounter (Signed)
Flexeril rx sent to pharmacy- sedation precautions.

## 2018-08-17 NOTE — Telephone Encounter (Signed)
Patient aware.

## 2018-08-20 ENCOUNTER — Ambulatory Visit (INDEPENDENT_AMBULATORY_CARE_PROVIDER_SITE_OTHER): Payer: Medicare Other | Admitting: Urology

## 2018-08-20 DIAGNOSIS — C61 Malignant neoplasm of prostate: Secondary | ICD-10-CM | POA: Diagnosis not present

## 2018-08-20 DIAGNOSIS — N403 Nodular prostate with lower urinary tract symptoms: Secondary | ICD-10-CM

## 2018-08-20 DIAGNOSIS — R972 Elevated prostate specific antigen [PSA]: Secondary | ICD-10-CM | POA: Diagnosis not present

## 2018-09-21 ENCOUNTER — Encounter: Payer: Self-pay | Admitting: Nurse Practitioner

## 2018-09-21 ENCOUNTER — Ambulatory Visit (INDEPENDENT_AMBULATORY_CARE_PROVIDER_SITE_OTHER): Payer: Medicare Other | Admitting: Nurse Practitioner

## 2018-09-21 DIAGNOSIS — K219 Gastro-esophageal reflux disease without esophagitis: Secondary | ICD-10-CM

## 2018-09-21 DIAGNOSIS — Z6835 Body mass index (BMI) 35.0-35.9, adult: Secondary | ICD-10-CM

## 2018-09-21 DIAGNOSIS — N183 Chronic kidney disease, stage 3 (moderate): Secondary | ICD-10-CM

## 2018-09-21 DIAGNOSIS — I10 Essential (primary) hypertension: Secondary | ICD-10-CM | POA: Diagnosis not present

## 2018-09-21 DIAGNOSIS — E782 Mixed hyperlipidemia: Secondary | ICD-10-CM

## 2018-09-21 DIAGNOSIS — E1122 Type 2 diabetes mellitus with diabetic chronic kidney disease: Secondary | ICD-10-CM

## 2018-09-21 DIAGNOSIS — E119 Type 2 diabetes mellitus without complications: Secondary | ICD-10-CM

## 2018-09-21 MED ORDER — METFORMIN HCL 1000 MG PO TABS: 1000.0000 mg | ORAL_TABLET | Freq: Two times a day (BID) | ORAL | 1 refills | Status: DC

## 2018-09-21 MED ORDER — ATORVASTATIN CALCIUM 40 MG PO TABS: 40.0000 mg | ORAL_TABLET | Freq: Every day | ORAL | 1 refills | Status: DC

## 2018-09-21 MED ORDER — GLIPIZIDE ER 10 MG PO TB24: 10.0000 mg | ORAL_TABLET | Freq: Every day | ORAL | 1 refills | Status: DC

## 2018-09-21 MED ORDER — OMEPRAZOLE 40 MG PO CPDR: 40.0000 mg | DELAYED_RELEASE_CAPSULE | Freq: Every day | ORAL | 1 refills | Status: DC

## 2018-09-21 MED ORDER — METOPROLOL SUCCINATE ER 50 MG PO TB24: 50.0000 mg | ORAL_TABLET | Freq: Every day | ORAL | 1 refills | Status: DC

## 2018-09-21 MED ORDER — LISINOPRIL 2.5 MG PO TABS: 2.5000 mg | ORAL_TABLET | Freq: Every day | ORAL | 1 refills | Status: DC

## 2018-09-21 NOTE — Progress Notes (Signed)
Subjective:    Patient ID: Jacob Rios, male    DOB: May 27, 1945, 73 y.o.   MRN: [id number]    Virtual Visit via telephone Note Due to COVID-19 pandemic this visit was conducted virtually. This visit type was conducted due to national recommendations for restrictions regarding the COVID-19 Pandemic (e.g. social distancing, sheltering in place) in an effort to limit this patient's exposure and mitigate transmission in our community. All issues noted in this document were discussed and addressed.  A physical exam was not performed with this format.  I connected with [name] on 09/21/18 at 10:05 by telephone and verified that I am speaking with the correct person using two identifiers. PAULANTHONY HEGLAR is currently located at home and no one is currently with him during visit. The provider, [name], FNP is located in their office at time of visit.  I discussed the limitations, risks, security and privacy concerns of performing an evaluation and management service by telephone and the availability of in person appointments. I also discussed with the patient that there may be a patient responsible charge related to this service. The patient expressed understanding and agreed to proceed.   History and Present Illness:   Patient ID: Jacob Rios, male    DOB: [birth year], 73 y.o.   MRN: [id number]   Chief Complaint: Medical Management of Chronic Issues    HPI:  1. Essential hypertension No c/o chest pain, sob or headache. Does not check blood pressure at home. BP Readings from Last 3 Encounters:  08/05/18 116/70  07/15/18 117/68  05/27/18 130/75     2. Gastroesophageal reflux disease without esophagitis Is on omeprazole daily which works well to keep symptoms under control.  3. Mixed hyperlipidemia Doe snot really watch diet very closely. No exercise. Lab Results  Component Value Date   CHOL 108 08/05/2018   HDL 29 (L) 08/05/2018   LDLCALC 60 08/05/2018   TRIG  97 08/05/2018   CHOLHDL 3.7 08/05/2018     4. Type 2 diabetes mellitus without complication, without long-term current use of insulin ([place]) He does not watch diet very closely and does not check his blood sugar very often. When he does check it it is not above 130. Lab Results  Component Value Date   HGBA1C 5.7 08/05/2018     5. CKD stage 3 due to type 2 diabetes mellitus [facility]) Lab Results  Component Value Date   CREATININE 1.64 (H) 08/05/2018     6. BMI 35.0-35.9,adult No recent weight changes    Outpatient Encounter Medications as of 09/21/2018  Medication Sig  . acetaminophen (TYLENOL) 500 MG tablet Take 1,000 mg by mouth every 6 (six) hours as needed for mild pain.  Marland Kitchen aspirin 81 MG tablet Take 81 mg by mouth daily.  Marland Kitchen atorvastatin (LIPITOR) 40 MG tablet Take 1 tablet (40 mg total) by mouth daily. (Patient taking differently: Take 40 mg by mouth at bedtime. )  . cyclobenzaprine (FLEXERIL) 10 MG tablet Take 1 tablet (10 mg total) by mouth 3 (three) times daily as needed for muscle spasms.  . fluticasone (FLONASE) 50 MCG/ACT nasal spray Place 1 spray into both nostrils daily for 14 days. (Patient not taking: Reported on 07/15/2018)  . glipiZIDE (GLUCOTROL XL) 10 MG 24 hr tablet Take 1 tablet (10 mg total) by mouth daily.  Marland Kitchen glucose blood (ONETOUCH VERIO) test strip Test 1X per day and as needed  Dx 250.02  . Lancets (ONETOUCH ULTRASOFT) lancets Patient test  1X per day and prn  Dx 250.02  . lisinopril (ZESTRIL) 2.5 MG tablet Take 1 tablet (2.5 mg total) by mouth daily.  . metFORMIN (GLUCOPHAGE) 1000 MG tablet Take 1 tablet (1,000 mg total) by mouth 2 (two) times daily with a meal. (Patient taking differently: Take 500 mg by mouth 2 (two) times daily with a meal. )  . metoprolol succinate (TOPROL-XL) 50 MG 24 hr tablet Take 1 tablet (50 mg total) by mouth daily.  . Omega-3 Fatty Acids (FISH OIL) 1000 MG CAPS Take 1 capsule by mouth daily.   Marland Kitchen omeprazole (PRILOSEC) 40 MG capsule Take  1 capsule (40 mg total) by mouth daily.  . vitamin C (ASCORBIC ACID) 500 MG tablet Take 500 mg by mouth daily.       Past Surgical History:  Procedure Laterality Date  . APPENDECTOMY    . CARDIAC CATHETERIZATION  2005  . CORONARY ARTERY BYPASS GRAFT  2000   3 vessel  . HERNIA REPAIR  1974  . PROSTATE BIOPSY  06/2017   x 4-5     Family History  Problem Relation Age of Onset  . Lung cancer Mother   . Cancer Mother   . Dementia Father   . Hyperlipidemia Father   . Congestive Heart Failure Father   . Post-traumatic stress disorder Son   . Alcohol abuse Maternal Grandfather   . Cancer Maternal Grandfather 90       stomach cancer   . Heart attack Neg Hx   . Stroke Neg Hx     New complaints: None today  Social history: Lives with his wife  Controlled substance contract: n/a    Review of Systems  Constitutional: Negative for activity change and appetite change.  HENT: Negative.   Eyes: Negative for pain.  Respiratory: Negative for shortness of breath.   Cardiovascular: Negative for chest pain, palpitations and leg swelling.  Gastrointestinal: Negative for abdominal pain.  Endocrine: Negative for polydipsia.  Genitourinary: Negative.   Skin: Negative for rash.  Neurological: Negative for dizziness, weakness and headaches.  Hematological: Does not bruise/bleed easily.  Psychiatric/Behavioral: Negative.   All other systems reviewed and are negative.    Observations/Objective: Alert and oriented- answers all questions appropriately No distress    Assessment and Plan: [name] comes in today with chief complaint of Medical Management of Chronic Issues   Diagnosis and orders addressed:  1. Essential hypertension Low sodium diet - metoprolol succinate (TOPROL-XL) 50 MG 24 hr tablet; Take 1 tablet (50 mg total) by mouth daily.  Dispense: 90 tablet; Refill: 1 - lisinopril (ZESTRIL) 2.5 MG tablet; Take 1 tablet (2.5 mg total) by mouth daily.  Dispense: 90  tablet; Refill: 1  2. Gastroesophageal reflux disease without esophagitis Avoid spicy foods Do not eat 2 hours prior to bedtime - omeprazole (PRILOSEC) 40 MG capsule; Take 1 capsule (40 mg total) by mouth daily.  Dispense: 90 capsule; Refill: 1  3. Mixed hyperlipidemia Low fat diet - atorvastatin (LIPITOR) 40 MG tablet; Take 1 tablet (40 mg total) by mouth daily.  Dispense: 90 tablet; Refill: 1  4. Type 2 diabetes mellitus without complication, without long-term current use of insulin (HCC) Continue to watch carbs in diet - glipiZIDE (GLUCOTROL XL) 10 MG 24 hr tablet; Take 1 tablet (10 mg total) by mouth daily.  Dispense: 90 tablet; Refill: 1 - metFORMIN (GLUCOPHAGE) 1000 MG tablet; Take 1 tablet (1,000 mg total) by mouth 2 (two) times daily with a meal.  Dispense: 180  tablet; Refill: 1  5. CKD stage 3 due to type 2 diabetes mellitus ([place]) Will repeat labs at next visit  6. BMI 35.0-35.9,adult Discussed diet and exercise for person with BMI >25 Will recheck weight in 3-6 months   Labs pending Health Maintenance reviewed Diet and exercise encouraged  Follow up plan: 3 months     I discussed the assessment and treatment plan with the patient. The patient was provided an opportunity to ask questions and all were answered. The patient agreed with the plan and demonstrated an understanding of the instructions.   The patient was advised to call back or seek an in-person evaluation if the symptoms worsen or if the condition fails to improve as anticipated.  The above assessment and management plan was discussed with the patient. The patient verbalized understanding of and has agreed to the management plan. Patient is aware to call the clinic if symptoms persist or worsen. Patient is aware when to return to the clinic for a follow-up visit. Patient educated on when it is appropriate to go to the emergency department.   Time call ended:  10:22  I provided 17 minutes of  non-face-to-face time during this encounter.    [name], FNP

## 2018-10-06 ENCOUNTER — Ambulatory Visit (INDEPENDENT_AMBULATORY_CARE_PROVIDER_SITE_OTHER): Payer: Medicare Other | Admitting: Physician Assistant

## 2018-10-06 ENCOUNTER — Other Ambulatory Visit: Payer: Self-pay

## 2018-10-06 DIAGNOSIS — R300 Dysuria: Secondary | ICD-10-CM

## 2018-10-06 DIAGNOSIS — N200 Calculus of kidney: Secondary | ICD-10-CM | POA: Diagnosis not present

## 2018-10-06 DIAGNOSIS — N390 Urinary tract infection, site not specified: Secondary | ICD-10-CM | POA: Diagnosis not present

## 2018-10-06 MED ORDER — CIPROFLOXACIN HCL 500 MG PO TABS: 500.0000 mg | ORAL_TABLET | Freq: Two times a day (BID) | ORAL | 0 refills | Status: DC

## 2018-10-07 ENCOUNTER — Encounter: Payer: Self-pay | Admitting: Physician Assistant

## 2018-10-07 NOTE — Progress Notes (Addendum)
Telephone visit  Subjective: CC:uti PCP: [name], FNP [id number] Jacob Rios is a 73 y.o. male calls for telephone consult today. Patient provides verbal consent for consult held via phone.  Patient is identified with 2 separate identifiers.  At this time the entire area is on COVID-19 social distancing and stay home orders are in place.  Patient is of higher risk and therefore we are performing this by a virtual method.  Location of patient: home Location of provider: HOME Others present for call: wife  This patient has had several days of dysuria, frequency and nocturia. There is also pain over the bladder in the suprapubic region, no back pain. Denies leakage or hematuria.  Denies fever or chills. No pain in flank area. The patient has a known kidney stones.  And he does feel like he may have passed one this morning.  He has gotten infection before when he has had the kidney stones.   ROS: Per HPI  Allergies  Allergen Reactions  . Morphine Swelling and Other (See Comments)    PT DOESNT REMEMBER /    Past Medical History:  Diagnosis Date  . Cancer [facility])    prostate cancer  . Coronary artery disease    3V CABG 2000. Last heart cath 2005, with 2 patent grafts  . Diabetes mellitus   . Hyperlipidemia   . Hypertension     Current Outpatient Medications:  .  acetaminophen (TYLENOL) 500 MG tablet, Take 1,000 mg by mouth every 6 (six) hours as needed for mild pain., Disp: , Rfl:  .  aspirin 81 MG tablet, Take 81 mg by mouth daily., Disp: , Rfl:  .  atorvastatin (LIPITOR) 40 MG tablet, Take 1 tablet (40 mg total) by mouth daily., Disp: 90 tablet, Rfl: 1 .  ciprofloxacin (CIPRO) 500 MG tablet, Take 1 tablet (500 mg total) by mouth 2 (two) times daily., Disp: 20 tablet, Rfl: 0 .  cyclobenzaprine (FLEXERIL) 10 MG tablet, Take 1 tablet (10 mg total) by mouth 3 (three) times daily as needed for muscle spasms., Disp: 30 tablet, Rfl: 1 .  fluticasone (FLONASE) 50 MCG/ACT  nasal spray, Place 1 spray into both nostrils daily for 14 days. (Patient not taking: Reported on 07/15/2018), Disp: 1 g, Rfl: 0 .  glipiZIDE (GLUCOTROL XL) 10 MG 24 hr tablet, Take 1 tablet (10 mg total) by mouth daily., Disp: 90 tablet, Rfl: 1 .  glucose blood (ONETOUCH VERIO) test strip, Test 1X per day and as needed  Dx 250.02, Disp: 100 each, Rfl: 12 .  Lancets (ONETOUCH ULTRASOFT) lancets, Patient test 1X per day and prn  Dx 250.02, Disp: 100 each, Rfl: 12 .  lisinopril (ZESTRIL) 2.5 MG tablet, Take 1 tablet (2.5 mg total) by mouth daily., Disp: 90 tablet, Rfl: 1 .  metFORMIN (GLUCOPHAGE) 1000 MG tablet, Take 1 tablet (1,000 mg total) by mouth 2 (two) times daily with a meal., Disp: 180 tablet, Rfl: 1 .  metoprolol succinate (TOPROL-XL) 50 MG 24 hr tablet, Take 1 tablet (50 mg total) by mouth daily., Disp: 90 tablet, Rfl: 1 .  Omega-3 Fatty Acids (FISH OIL) 1000 MG CAPS, Take 1 capsule by mouth daily. , Disp: , Rfl:  .  omeprazole (PRILOSEC) 40 MG capsule, Take 1 capsule (40 mg total) by mouth daily., Disp: 90 capsule, Rfl: 1 .  vitamin C (ASCORBIC ACID) 500 MG tablet, Take 500 mg by mouth daily.  , Disp: , Rfl:   Assessment/ Plan: 73 y.o. male  1. Dysuria - ciprofloxacin (CIPRO) 500 MG tablet; Take 1 tablet (500 mg total) by mouth 2 (two) times daily.  Dispense: 20 tablet; Refill: 0  2. Kidney stone - ciprofloxacin (CIPRO) 500 MG tablet; Take 1 tablet (500 mg total) by mouth 2 (two) times daily.  Dispense: 20 tablet; Refill: 0  3. Urinary tract infection without hematuria, site unspecified - ciprofloxacin (CIPRO) 500 MG tablet; Take 1 tablet (500 mg total) by mouth 2 (two) times daily.  Dispense: 20 tablet; Refill: 0   No follow-ups on file.  Continue all other maintenance medications as listed above.  Start time: 2:34 PM End time: 2:43 PM  Meds ordered this encounter  Medications  . ciprofloxacin (CIPRO) 500 MG tablet    Sig: Take 1 tablet (500 mg total) by mouth 2 (two)  times daily.    Dispense:  20 tablet    Refill:  0    Order Specific Question:   Supervising Provider    Answer:   Janora Norlander [id number]    Particia Nearing PA-C [facility] [phone]

## 2018-10-15 ENCOUNTER — Other Ambulatory Visit: Payer: Self-pay

## 2018-10-15 ENCOUNTER — Ambulatory Visit: Payer: Medicare Other | Admitting: Urology

## 2018-10-15 DIAGNOSIS — C61 Malignant neoplasm of prostate: Secondary | ICD-10-CM

## 2018-10-15 DIAGNOSIS — N403 Nodular prostate with lower urinary tract symptoms: Secondary | ICD-10-CM

## 2018-10-15 DIAGNOSIS — R972 Elevated prostate specific antigen [PSA]: Secondary | ICD-10-CM

## 2018-10-15 DIAGNOSIS — R351 Nocturia: Secondary | ICD-10-CM | POA: Diagnosis not present

## 2018-10-26 ENCOUNTER — Ambulatory Visit (INDEPENDENT_AMBULATORY_CARE_PROVIDER_SITE_OTHER): Payer: Medicare Other

## 2018-10-26 ENCOUNTER — Other Ambulatory Visit: Payer: Self-pay

## 2018-10-26 ENCOUNTER — Ambulatory Visit: Payer: Medicare Other

## 2018-10-26 DIAGNOSIS — Z23 Encounter for immunization: Secondary | ICD-10-CM | POA: Diagnosis not present

## 2018-12-14 HISTORY — PX: CATARACT EXTRACTION W/ INTRAOCULAR LENS IMPLANT: SHX1309

## 2018-12-21 ENCOUNTER — Ambulatory Visit (INDEPENDENT_AMBULATORY_CARE_PROVIDER_SITE_OTHER): Payer: Medicare Other | Admitting: Nurse Practitioner

## 2018-12-21 ENCOUNTER — Encounter: Payer: Self-pay | Admitting: Nurse Practitioner

## 2018-12-21 DIAGNOSIS — I25810 Atherosclerosis of coronary artery bypass graft(s) without angina pectoris: Secondary | ICD-10-CM

## 2018-12-21 DIAGNOSIS — I10 Essential (primary) hypertension: Secondary | ICD-10-CM | POA: Diagnosis not present

## 2018-12-21 DIAGNOSIS — Z6835 Body mass index (BMI) 35.0-35.9, adult: Secondary | ICD-10-CM

## 2018-12-21 DIAGNOSIS — E782 Mixed hyperlipidemia: Secondary | ICD-10-CM

## 2018-12-21 DIAGNOSIS — E1122 Type 2 diabetes mellitus with diabetic chronic kidney disease: Secondary | ICD-10-CM | POA: Diagnosis not present

## 2018-12-21 DIAGNOSIS — E119 Type 2 diabetes mellitus without complications: Secondary | ICD-10-CM

## 2018-12-21 DIAGNOSIS — N183 Chronic kidney disease, stage 3 unspecified: Secondary | ICD-10-CM

## 2018-12-21 DIAGNOSIS — K219 Gastro-esophageal reflux disease without esophagitis: Secondary | ICD-10-CM

## 2018-12-21 NOTE — Progress Notes (Signed)
Virtual Visit via telephone Note Due to COVID-19 pandemic this visit was conducted virtually. This visit type was conducted due to national recommendations for restrictions regarding the COVID-19 Pandemic (e.g. social distancing, sheltering in place) in an effort to limit this patient's exposure and mitigate transmission in our community. All issues noted in this document were discussed and addressed.  A physical exam was not performed with this format.  I connected with Jacob Rios on 12/21/18 at 11:50 by telephone and verified that I am speaking with the correct person using two identifiers. Jacob Rios is currently located at home and his wifeh is currently with him during visit. The provider, Jacob Hassell Done, FNP is located in their office at time of visit.  I discussed the limitations, risks, security and privacy concerns of performing an evaluation and management service by telephone and the availability of in person appointments. I also discussed with the patient that there may be a patient responsible charge related to this service. The patient expressed understanding and agreed to proceed.   History and Present Illness:   Chief Complaint: Medical Management of Chronic Issues    HPI:  1. Essential hypertension No c/o chest pain, sob or headache. Does not check blood pressure at home. BP Readings from Last 3 Encounters:  08/05/18 116/70  07/15/18 117/68  05/27/18 130/75     2. 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 According to office note they made no change to plan of care.  3. Mixed hyperlipidemia Watches diet and walks daily.  4. Type 2 diabetes mellitus without complication, without long-term current use of insulin (HCC) fasting blood sugars running around 110-130. Tries to watch what he eats,]\.  5. CKD stage 3 due to type 2 diabetes mellitus (HCC) No problems voiding Lab  Results  Component Value Date   CREATININE 1.64 (H) 08/05/2018     6. Gastroesophageal reflux disease without esophagitis takes omeprazole daily which is working well to keep symptoms under control.  7. BMI 35.0-35.9,adult No recent weight changes BP Readings from Last 3 Encounters:  08/05/18 116/70  07/15/18 117/68  05/27/18 130/75   BMI Readings from Last 3 Encounters:  08/05/18 33.47 kg/m  05/27/18 33.47 kg/m  03/10/18 33.17 kg/m       Outpatient Encounter Medications as of 12/21/2018  Medication Sig  . acetaminophen (TYLENOL) 500 MG tablet Take 1,000 mg by mouth every 6 (six) hours as needed for mild pain.  Marland Kitchen aspirin 81 MG tablet Take 81 mg by mouth daily.  Marland Kitchen atorvastatin (LIPITOR) 40 MG tablet Take 1 tablet (40 mg total) by mouth daily.  . ciprofloxacin (CIPRO) 500 MG tablet Take 1 tablet (500 mg total) by mouth 2 (two) times daily.  . cyclobenzaprine (FLEXERIL) 10 MG tablet Take 1 tablet (10 mg total) by mouth 3 (three) times daily as needed for muscle spasms.  . fluticasone (FLONASE) 50 MCG/ACT nasal spray Place 1 spray into both nostrils daily for 14 days. (Patient not taking: Reported on 07/15/2018)  . glipiZIDE (GLUCOTROL XL) 10 MG 24 hr tablet Take 1 tablet (10 mg total) by mouth daily.  Marland Kitchen glucose blood (ONETOUCH VERIO) test strip Test 1X per day and as needed  Dx 250.02  . Lancets (ONETOUCH ULTRASOFT) lancets Patient test 1X per day and prn  Dx 250.02  . lisinopril (ZESTRIL) 2.5 MG tablet Take 1 tablet (2.5 mg total) by mouth daily.  . metFORMIN (GLUCOPHAGE) 1000 MG tablet  Take 1 tablet (1,000 mg total) by mouth 2 (two) times daily with a meal.  . metoprolol succinate (TOPROL-XL) 50 MG 24 hr tablet Take 1 tablet (50 mg total) by mouth daily.  . Omega-3 Fatty Acids (FISH OIL) 1000 MG CAPS Take 1 capsule by mouth daily.   Marland Kitchen omeprazole (PRILOSEC) 40 MG capsule Take 1 capsule (40 mg total) by mouth daily.  . vitamin C (ASCORBIC ACID) 500 MG tablet Take 500 mg by mouth  daily.       Past Surgical History:  Procedure Laterality Date  . APPENDECTOMY    . CARDIAC CATHETERIZATION  2005  . CORONARY ARTERY BYPASS GRAFT  2000   3 vessel  . HERNIA REPAIR  1974  . PROSTATE BIOPSY  06/2017   x 4-5     Family History  Problem Relation Age of Onset  . Lung cancer Mother   . Cancer Mother   . Dementia Father   . Hyperlipidemia Father   . Congestive Heart Failure Father   . Post-traumatic stress disorder Son   . Alcohol abuse Maternal Grandfather   . Cancer Maternal Grandfather 90       stomach cancer   . Heart attack Neg Hx   . Stroke Neg Hx     New complaints: None today  Social history: Lives with wife and they have been staying at home. There daughter has a copper deficiency and is currently not ambulatory from that  Controlled substance contract: n/a    Review of Systems  Constitutional: Negative for diaphoresis and weight loss.  Eyes: Negative for blurred vision, double vision and pain.  Respiratory: Negative for shortness of breath.   Cardiovascular: Negative for chest pain, palpitations, orthopnea and leg swelling.  Gastrointestinal: Negative for abdominal pain.  Skin: Negative for rash.  Neurological: Negative for dizziness, sensory change, loss of consciousness, weakness and headaches.  Endo/Heme/Allergies: Negative for polydipsia. Does not bruise/bleed easily.  Psychiatric/Behavioral: Negative for memory loss. The patient does not have insomnia.   All other systems reviewed and are negative.    Observations/Objective: Alert and oriented- answers all questions appropriately No distress    Assessment and Plan: RAYJON [name] comes in today with chief complaint of Medical Management of Chronic Issues   Diagnosis and orders addressed:  1. Essential hypertension Low sodium diet - CMP14+EGFR; Future  2. 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 Mixed hyperlipidemia Low fat diet - Lipid panel; Future  4. Type 2 diabetes mellitus without complication, without long-term current use of insulin (HCC) Continue to watch carbs in diet - hgba1c; Future  5. CKD stage 3 due to type 2 diabetes mellitus ([place]) Labs pending  6. Gastroesophageal reflux disease without esophagitis Avoid spicy foods Do not eat 2 hours prior to bedtime  7. BMI 35.0-35.9,adult Discussed diet and exercise for person with BMI >25 Will recheck weight in 3-6 months   Labs pending Health Maintenance reviewed Diet and exercise encouraged  Follow up plan: 3 months      I discussed the assessment and treatment plan with the patient. The patient was provided an opportunity to ask questions and all were answered. The patient agreed with the plan and demonstrated an understanding of the instructions.   The patient was advised to call back or seek an in-person evaluation if the symptoms worsen or if the condition fails to improve as anticipated.  The above assessment and management plan  was discussed with the patient. The patient verbalized understanding of and has agreed to the management plan. Patient is aware to call the clinic if symptoms persist or worsen. Patient is aware when to return to the clinic for a follow-up visit. Patient educated on when it is appropriate to go to the emergency department.   Time call ended:  12:07  I provided 17 minutes of non-face-to-face time during this encounter.    Jacob Hassell Done, FNP

## 2018-12-23 ENCOUNTER — Other Ambulatory Visit: Payer: Medicare Other

## 2018-12-23 ENCOUNTER — Other Ambulatory Visit: Payer: Self-pay

## 2018-12-23 DIAGNOSIS — E782 Mixed hyperlipidemia: Secondary | ICD-10-CM

## 2018-12-23 DIAGNOSIS — I10 Essential (primary) hypertension: Secondary | ICD-10-CM

## 2018-12-23 DIAGNOSIS — E119 Type 2 diabetes mellitus without complications: Secondary | ICD-10-CM

## 2018-12-23 LAB — BAYER DCA HB A1C WAIVED: HB A1C (BAYER DCA - WAIVED): 5.6 % (ref <7.0)

## 2018-12-24 LAB — CMP14+EGFR
ALT: 12 IU/L (ref 0–44)
AST: 11 IU/L (ref 0–40)
Albumin/Globulin Ratio: 1.6 (ref 1.2–2.2)
Albumin: 4.2 g/dL (ref 3.7–4.7)
Alkaline Phosphatase: 112 IU/L (ref 39–117)
BUN/Creatinine Ratio: 13 (ref 10–24)
BUN: 17 mg/dL (ref 8–27)
Bilirubin Total: 0.4 mg/dL (ref 0.0–1.2)
CO2: 25 mmol/L (ref 20–29)
Calcium: 9.5 mg/dL (ref 8.6–10.2)
Chloride: 101 mmol/L (ref 96–106)
Creatinine, Ser: 1.27 mg/dL (ref 0.76–1.27)
GFR calc Af Amer: 65 mL/min/{1.73_m2} (ref >59)
GFR calc non Af Amer: 56 mL/min/{1.73_m2} — ABNORMAL LOW (ref >59)
Globulin, Total: 2.6 g/dL (ref 1.5–4.5)
Glucose: 113 mg/dL — ABNORMAL HIGH (ref 65–99)
Potassium: 4.4 mmol/L (ref 3.5–5.2)
Sodium: 141 mmol/L (ref 134–144)
Total Protein: 6.8 g/dL (ref 6.0–8.5)

## 2018-12-24 LAB — LIPID PANEL
Chol/HDL Ratio: 2.9 ratio (ref 0.0–5.0)
Cholesterol, Total: 99 mg/dL — ABNORMAL LOW (ref 100–199)
HDL: 34 mg/dL — ABNORMAL LOW (ref >39)
LDL Chol Calc (NIH): 50 mg/dL (ref 0–99)
Triglycerides: 70 mg/dL (ref 0–149)
VLDL Cholesterol Cal: 15 mg/dL (ref 5–40)

## 2019-01-20 ENCOUNTER — Other Ambulatory Visit: Payer: Self-pay

## 2019-01-20 DIAGNOSIS — C61 Malignant neoplasm of prostate: Secondary | ICD-10-CM

## 2019-02-01 NOTE — Progress Notes (Signed)
Cardiology Office Note    Date:  02/02/2019   ID:  Jacob Rios, Jacob Rios Oct 27, 1945, MRN [id number]  PCP:  [name], FNP  Cardiologist: Lauree Chandler, MD EPS: None  No chief complaint on file.   History of Present Illness:  Jacob Rios is a 74 y.o. male with history of CAD s/p MI followed by CABG 2000, last cath 2005 occluded LIMA to LAD, native LAD open, patent SVG to diagonal and patent RIMA to RCA, normal stress myoview 2014, HTN, HLD, DM and former tobacco abuse.  LOV Dr. Angelena Form 01/2018 he recommended f/u echo at next visit.  Patient comes in for f/u. Walks in [place], does yard work.  Denies chest pain, shortness of breath, palpitations, dizziness, edema, or presyncope.     Past Medical History:  Diagnosis Date  . Cancer [facility])    prostate cancer  . Coronary artery disease    3V CABG 2000. Last heart cath 2005, with 2 patent grafts  . Diabetes mellitus   . Hyperlipidemia   . Hypertension     Past Surgical History:  Procedure Laterality Date  . APPENDECTOMY    . CARDIAC CATHETERIZATION  2005  . CORONARY ARTERY BYPASS GRAFT  2000   3 vessel  . HERNIA REPAIR  1974  . PROSTATE BIOPSY  06/2017   x 4-5     Current Medications: Current Meds  Medication Sig  . aspirin 81 MG tablet Take 81 mg by mouth daily.  Marland Kitchen atorvastatin (LIPITOR) 40 MG tablet Take 1 tablet (40 mg total) by mouth daily.  Marland Kitchen glipiZIDE (GLUCOTROL XL) 10 MG 24 hr tablet Take 1 tablet (10 mg total) by mouth daily.  Marland Kitchen glucose blood (ONETOUCH VERIO) test strip Test 1X per day and as needed  Dx 250.02  . Lancets (ONETOUCH ULTRASOFT) lancets Patient test 1X per day and prn  Dx 250.02  . lisinopril (ZESTRIL) 2.5 MG tablet Take 1 tablet (2.5 mg total) by mouth daily.  . metFORMIN (GLUCOPHAGE) 1000 MG tablet Take 1 tablet (1,000 mg total) by mouth 2 (two) times daily with a meal.  . metoprolol succinate (TOPROL-XL) 50 MG 24 hr tablet Take 1 tablet (50 mg total) by mouth daily.  .  Omega-3 Fatty Acids (FISH OIL) 1000 MG CAPS Take 1 capsule by mouth daily.   Marland Kitchen omeprazole (PRILOSEC) 40 MG capsule Take 1 capsule (40 mg total) by mouth daily.  . vitamin C (ASCORBIC ACID) 500 MG tablet Take 500 mg by mouth daily.       Allergies:   Morphine   Social History   Socioeconomic History  . Marital status: Married    Spouse name: Vaughan Basta   . Number of children: 3  . Years of education: Not on file  . Highest education level: Not on file  Occupational History  . Occupation: part-time Pharmacist, community    Comment: retired   Tobacco Use  . Smoking status: Former Smoker    Quit date: 01/13/1997    Years since quitting: 22.0  . Smokeless tobacco: Never Used  Substance and Sexual Activity  . Alcohol use: No  . Drug use: No  . Sexual activity: Not on file  Other Topics Concern  . Not on file  Social History Narrative  . Not on file   Social Determinants of Health   Financial Resource Strain:   . Difficulty of Paying Living Expenses: Not on file  Food Insecurity:   . Worried About Charity fundraiser in the Last  Year: Not on file  . Ran Out of Food in the Last Year: Not on file  Transportation Needs:   . Lack of Transportation (Medical): Not on file  . Lack of Transportation (Non-Medical): Not on file  Physical Activity:   . Days of Exercise per Week: Not on file  . Minutes of Exercise per Session: Not on file  Stress:   . Feeling of Stress : Not on file  Social Connections:   . Frequency of Communication with Friends and Family: Not on file  . Frequency of Social Gatherings with Friends and Family: Not on file  . Attends Religious Services: Not on file  . Active Member of Clubs or Organizations: Not on file  . Attends Archivist Meetings: Not on file  . Marital Status: Not on file     Family History:  The patient's family history includes Alcohol abuse in his maternal grandfather; Cancer in his mother; Cancer (age of onset: 84) in his maternal grandfather;  Congestive Heart Failure in his father; Dementia in his father; Hyperlipidemia in his father; Lung cancer in his mother; Post-traumatic stress disorder in his son.   ROS:   Please see the history of present illness.    ROS All other systems reviewed and are negative.   PHYSICAL EXAM:   VS:  BP (!) 142/82   Pulse 67   Ht 5\' 11"  (1.803 m)   Wt 225 lb (102.1 kg)   SpO2 95%   BMI 31.38 kg/m   Physical Exam  GEN: Well nourished, well developed, in no acute distress  Neck: no JVD, carotid bruits, or masses Cardiac:RRR; no murmurs, rubs, or gallops  Respiratory: Decreased breath sounds but clear to auscultation bilaterally, normal work of breathing GI: soft, nontender, nondistended, + BS Ext: without cyanosis, clubbing, or edema, Good distal pulses bilaterally Neuro:  Alert and Oriented x 3 Psych: euthymic mood, full affect  Wt Readings from Last 3 Encounters:  02/02/19 225 lb (102.1 kg)  08/05/18 240 lb (108.9 kg)  05/27/18 240 lb (108.9 kg)      Studies/Labs Reviewed:   EKG:  EKG is  ordered today.  The ekg ordered today demonstrates normal sinus rhythm, no acute change  Recent Labs: 08/05/2018: Hemoglobin 11.8; Platelets 242 12/23/2018: ALT 12; BUN 17; Creatinine, Ser 1.27; Potassium 4.4; Sodium 141   Lipid Panel    Component Value Date/Time   CHOL 99 (L) 12/23/2018 0802   CHOL 107 07/30/2012 1158   TRIG 70 12/23/2018 0802   TRIG 96 07/30/2012 1158   HDL 34 (L) 12/23/2018 0802   HDL 24 (L) 07/30/2012 1158   CHOLHDL 2.9 12/23/2018 0802   LDLCALC 50 12/23/2018 0802   LDLCALC 64 07/30/2012 1158    Additional studies/ records that were reviewed today include:  NST 2014 no ischemia    ASSESSMENT:    1. Coronary artery disease involving coronary bypass graft of native heart without angina pectoris   2. Essential hypertension   3. Mixed hyperlipidemia   4. Type 2 diabetes mellitus without complication, without long-term current use of insulin ([place])   5. CKD stage  3 due to type 2 diabetes mellitus ([place])   6. Screening for AAA (abdominal aortic aneurysm)      PLAN:  In order of problems listed above:  CAD Status post MI followed by CABG in 2000, last thousand 5 occluded LIMA to be, native LAD open, patent SVG to the diagonal to the RCA, no ischemia on stress Myoview  2014.  Dr. Angelena Form recommended follow-up echo at next office visit so will order that to assess LV function.  Essential hypertension blood pressure controlled  Hyperlipidemia LDL 50 12/2018  Diabetes mellitus on a statin A1c 5.6 12/2018  CKD stage III 1.27 12/23/2018  History of tobacco use for 46 years but quit.  Will order screening abdominal aortic ultrasound to rule out aneurysm  Medication Adjustments/Labs and Tests Ordered: Current medicines are reviewed at length with the patient today.  Concerns regarding medicines are outlined above.  Medication changes, Labs and Tests ordered today are listed in the Patient Instructions below. Patient Instructions  Medication Instructions:  Your physician recommends that you continue on your current medications as directed. Please refer to the Current Medication list given to you today.  *If you need a refill on your cardiac medications before your next appointment, please call your pharmacy*  Lab Work: None ordered  If you have labs (blood work) drawn today and your tests are completely normal, you will receive your results only by: Marland Kitchen MyChart Message (if you have MyChart) OR . A paper copy in the mail If you have any lab test that is abnormal or we need to change your treatment, we will call you to review the results.  Testing/Procedures: Your physician has requested that you have an echocardiogram at the [facility] in 2 months. Echocardiography is a painless test that uses sound waves to create images of your heart. It provides your doctor with information about the size and shape of your heart and how well your heart's chambers  and valves are working. This procedure takes approximately one hour. There are no restrictions for this procedure.  Your physician has requested that you have an abdominal aorta duplex at the [facility] in 2 months. During this test, an ultrasound is used to evaluate the aorta. Allow 30 minutes for this exam. Do not eat after midnight the day before and avoid carbonated beverages   Follow-Up: At [facility], you and your health needs are our priority.  As part of our continuing mission to provide you with exceptional heart care, we have created designated Provider Care Teams.  These Care Teams include your primary Cardiologist (physician) and Advanced Practice Providers (APPs -  Physician Assistants and Nurse Practitioners) who all work together to provide you with the care you need, when you need it.  Your next appointment:   12 month(s)  The format for your next appointment:   In Person  Provider:   You may see Lauree Chandler, MD or one of the following Advanced Practice Providers on your designated Care Team:    Melina Copa, PA-C  Ermalinda Barrios, PA-C   Other Instructions      Signed, Ermalinda Barrios, PA-C  02/02/2019 9:57 AM    [address], [place], [place]  [id number] Phone: [phone]; Fax: [phone]

## 2019-02-02 ENCOUNTER — Other Ambulatory Visit: Payer: Self-pay

## 2019-02-02 ENCOUNTER — Ambulatory Visit: Payer: Medicare PPO | Admitting: Physician Assistant

## 2019-02-02 ENCOUNTER — Encounter: Payer: Self-pay | Admitting: Physician Assistant

## 2019-02-02 VITALS — BP 142/82 | HR 67 | Ht 71.0 in | Wt 225.0 lb

## 2019-02-02 DIAGNOSIS — N183 Chronic kidney disease, stage 3 unspecified: Secondary | ICD-10-CM | POA: Diagnosis not present

## 2019-02-02 DIAGNOSIS — E1122 Type 2 diabetes mellitus with diabetic chronic kidney disease: Secondary | ICD-10-CM

## 2019-02-02 DIAGNOSIS — I25810 Atherosclerosis of coronary artery bypass graft(s) without angina pectoris: Secondary | ICD-10-CM | POA: Diagnosis not present

## 2019-02-02 DIAGNOSIS — Z136 Encounter for screening for cardiovascular disorders: Secondary | ICD-10-CM

## 2019-02-02 DIAGNOSIS — I10 Essential (primary) hypertension: Secondary | ICD-10-CM

## 2019-02-02 DIAGNOSIS — E119 Type 2 diabetes mellitus without complications: Secondary | ICD-10-CM

## 2019-02-02 DIAGNOSIS — E782 Mixed hyperlipidemia: Secondary | ICD-10-CM | POA: Diagnosis not present

## 2019-02-02 NOTE — Patient Instructions (Addendum)
Medication Instructions:  Your physician recommends that you continue on your current medications as directed. Please refer to the Current Medication list given to you today.  *If you need a refill on your cardiac medications before your next appointment, please call your pharmacy*  Lab Work: None ordered  If you have labs (blood work) drawn today and your tests are completely normal, you will receive your results only by: Marland Kitchen MyChart Message (if you have MyChart) OR . A paper copy in the mail If you have any lab test that is abnormal or we need to change your treatment, we will call you to review the results.  Testing/Procedures: Your physician has requested that you have an echocardiogram at the [facility] in 2 months. Echocardiography is a painless test that uses sound waves to create images of your heart. It provides your doctor with information about the size and shape of your heart and how well your heart's chambers and valves are working. This procedure takes approximately one hour. There are no restrictions for this procedure.  Your physician has requested that you have an abdominal aorta duplex at the [facility] in 2 months. During this test, an ultrasound is used to evaluate the aorta. Allow 30 minutes for this exam. Do not eat after midnight the day before and avoid carbonated beverages   Follow-Up: At [facility], you and your health needs are our priority.  As part of our continuing mission to provide you with exceptional heart care, we have created designated Provider Care Teams.  These Care Teams include your primary Cardiologist (physician) and Advanced Practice Providers (APPs -  Physician Assistants and Nurse Practitioners) who all work together to provide you with the care you need, when you need it.  Your next appointment:   12 month(s)  The format for your next appointment:   In Person  Provider:   You may see Lauree Chandler, MD or one of the following  Advanced Practice Providers on your designated Care Team:    Melina Copa, PA-C  Ermalinda Barrios, PA-C   Other Instructions

## 2019-02-03 ENCOUNTER — Other Ambulatory Visit: Payer: Self-pay | Admitting: Physician Assistant

## 2019-02-03 DIAGNOSIS — Z136 Encounter for screening for cardiovascular disorders: Secondary | ICD-10-CM

## 2019-03-24 ENCOUNTER — Ambulatory Visit: Payer: Self-pay | Admitting: Nurse Practitioner

## 2019-03-30 ENCOUNTER — Other Ambulatory Visit: Payer: Self-pay | Admitting: Urology

## 2019-03-30 ENCOUNTER — Other Ambulatory Visit: Payer: Self-pay | Admitting: Physician Assistant

## 2019-03-30 ENCOUNTER — Ambulatory Visit (INDEPENDENT_AMBULATORY_CARE_PROVIDER_SITE_OTHER): Payer: Medicare PPO

## 2019-03-30 ENCOUNTER — Other Ambulatory Visit: Payer: Self-pay

## 2019-03-30 ENCOUNTER — Telehealth: Payer: Self-pay | Admitting: Physician Assistant

## 2019-03-30 DIAGNOSIS — I25810 Atherosclerosis of coronary artery bypass graft(s) without angina pectoris: Secondary | ICD-10-CM

## 2019-03-30 DIAGNOSIS — Z136 Encounter for screening for cardiovascular disorders: Secondary | ICD-10-CM

## 2019-03-30 DIAGNOSIS — I714 Abdominal aortic aneurysm, without rupture, unspecified: Secondary | ICD-10-CM

## 2019-03-30 DIAGNOSIS — C61 Malignant neoplasm of prostate: Secondary | ICD-10-CM | POA: Diagnosis not present

## 2019-03-30 DIAGNOSIS — I771 Stricture of artery: Secondary | ICD-10-CM

## 2019-03-30 NOTE — Telephone Encounter (Signed)
Drue Novel I, RN  03/30/2019 12:36 PM EDT    The patient has been notified of the result and verbalized understanding. Repeat AAA Duplex ordered for 1 year. Patient denies having any pain with walking. Referral placed for patient to see Dr. Fletcher Anon. All questions (if any) were answered. Cleon Gustin, RN 03/30/2019 12:35 PM    Drue Novel I, RN  03/30/2019 10:39 AM EDT    Left message for patient to call back.   Imogene Burn, PA-C  03/30/2019 9:46 AM EDT    Abdominal aorta with only mild dilation-repeat in 1 yr. He does have > 50% left iliac stenosis. Ask if he's having any leg pain with walking? Please refer to Dr. Fletcher Anon to evaluate and follow. thanks

## 2019-03-30 NOTE — Telephone Encounter (Signed)
Follow Up: ° ° ° °Returning your call, concerning his results. °

## 2019-03-31 LAB — PSA: PSA: 10.4 ng/mL — ABNORMAL HIGH (ref <=4.0)

## 2019-04-04 ENCOUNTER — Other Ambulatory Visit: Payer: Self-pay | Admitting: Urology

## 2019-04-07 ENCOUNTER — Other Ambulatory Visit: Payer: Self-pay

## 2019-04-07 ENCOUNTER — Encounter: Payer: Self-pay | Admitting: Nurse Practitioner

## 2019-04-07 ENCOUNTER — Ambulatory Visit (INDEPENDENT_AMBULATORY_CARE_PROVIDER_SITE_OTHER): Payer: Medicare PPO | Admitting: Nurse Practitioner

## 2019-04-07 VITALS — BP 148/77 | HR 70 | Temp 98.6°F | Resp 20 | Ht 71.0 in | Wt 222.0 lb

## 2019-04-07 DIAGNOSIS — Z6835 Body mass index (BMI) 35.0-35.9, adult: Secondary | ICD-10-CM

## 2019-04-07 DIAGNOSIS — R972 Elevated prostate specific antigen [PSA]: Secondary | ICD-10-CM

## 2019-04-07 DIAGNOSIS — I25810 Atherosclerosis of coronary artery bypass graft(s) without angina pectoris: Secondary | ICD-10-CM

## 2019-04-07 DIAGNOSIS — E782 Mixed hyperlipidemia: Secondary | ICD-10-CM | POA: Diagnosis not present

## 2019-04-07 DIAGNOSIS — I10 Essential (primary) hypertension: Secondary | ICD-10-CM

## 2019-04-07 DIAGNOSIS — E119 Type 2 diabetes mellitus without complications: Secondary | ICD-10-CM

## 2019-04-07 DIAGNOSIS — N183 Chronic kidney disease, stage 3 unspecified: Secondary | ICD-10-CM

## 2019-04-07 DIAGNOSIS — K219 Gastro-esophageal reflux disease without esophagitis: Secondary | ICD-10-CM

## 2019-04-07 DIAGNOSIS — E1122 Type 2 diabetes mellitus with diabetic chronic kidney disease: Secondary | ICD-10-CM | POA: Diagnosis not present

## 2019-04-07 LAB — CMP14+EGFR
ALT: 13 IU/L (ref 0–44)
AST: 16 IU/L (ref 0–40)
Albumin/Globulin Ratio: 1.5 (ref 1.2–2.2)
Albumin: 4.3 g/dL (ref 3.7–4.7)
Alkaline Phosphatase: 99 IU/L (ref 39–117)
BUN/Creatinine Ratio: 13 (ref 10–24)
BUN: 17 mg/dL (ref 8–27)
Bilirubin Total: 0.5 mg/dL (ref 0.0–1.2)
CO2: 23 mmol/L (ref 20–29)
Calcium: 9.5 mg/dL (ref 8.6–10.2)
Chloride: 103 mmol/L (ref 96–106)
Creatinine, Ser: 1.36 mg/dL — ABNORMAL HIGH (ref 0.76–1.27)
GFR calc Af Amer: 60 mL/min/{1.73_m2} (ref >59)
GFR calc non Af Amer: 52 mL/min/{1.73_m2} — ABNORMAL LOW (ref >59)
Globulin, Total: 2.8 g/dL (ref 1.5–4.5)
Glucose: 99 mg/dL (ref 65–99)
Potassium: 4.7 mmol/L (ref 3.5–5.2)
Sodium: 141 mmol/L (ref 134–144)
Total Protein: 7.1 g/dL (ref 6.0–8.5)

## 2019-04-07 LAB — LIPID PANEL
Chol/HDL Ratio: 3.1 ratio (ref 0.0–5.0)
Cholesterol, Total: 106 mg/dL (ref 100–199)
HDL: 34 mg/dL — ABNORMAL LOW (ref >39)
LDL Chol Calc (NIH): 59 mg/dL (ref 0–99)
Triglycerides: 57 mg/dL (ref 0–149)
VLDL Cholesterol Cal: 13 mg/dL (ref 5–40)

## 2019-04-07 LAB — CBC WITH DIFFERENTIAL/PLATELET
Basophils Absolute: 0.1 10*3/uL (ref 0.0–0.2)
Basos: 1 %
EOS (ABSOLUTE): 0.3 10*3/uL (ref 0.0–0.4)
Eos: 5 %
Hematocrit: 40.5 % (ref 37.5–51.0)
Hemoglobin: 13.5 g/dL (ref 13.0–17.7)
Immature Grans (Abs): 0 10*3/uL (ref 0.0–0.1)
Immature Granulocytes: 0 %
Lymphocytes Absolute: 1.3 10*3/uL (ref 0.7–3.1)
Lymphs: 22 %
MCH: 27.7 pg (ref 26.6–33.0)
MCHC: 33.3 g/dL (ref 31.5–35.7)
MCV: 83 fL (ref 79–97)
Monocytes Absolute: 0.5 10*3/uL (ref 0.1–0.9)
Monocytes: 9 %
Neutrophils Absolute: 3.9 10*3/uL (ref 1.4–7.0)
Neutrophils: 63 %
Platelets: 191 10*3/uL (ref 150–450)
RBC: 4.88 x10E6/uL (ref 4.14–5.80)
RDW: 15.2 % (ref 11.6–15.4)
WBC: 6.1 10*3/uL (ref 3.4–10.8)

## 2019-04-07 LAB — BAYER DCA HB A1C WAIVED: HB A1C (BAYER DCA - WAIVED): 6.1 % (ref <7.0)

## 2019-04-07 MED ORDER — LISINOPRIL 2.5 MG PO TABS: 2.5000 mg | ORAL_TABLET | Freq: Every day | ORAL | 1 refills | Status: DC

## 2019-04-07 MED ORDER — OMEPRAZOLE 40 MG PO CPDR: 40.0000 mg | DELAYED_RELEASE_CAPSULE | Freq: Every day | ORAL | 1 refills | Status: DC

## 2019-04-07 MED ORDER — METFORMIN HCL 1000 MG PO TABS: 1000.0000 mg | ORAL_TABLET | Freq: Two times a day (BID) | ORAL | 1 refills | Status: DC

## 2019-04-07 MED ORDER — METOPROLOL SUCCINATE ER 50 MG PO TB24: 50.0000 mg | ORAL_TABLET | Freq: Every day | ORAL | 1 refills | Status: DC

## 2019-04-07 MED ORDER — ATORVASTATIN CALCIUM 40 MG PO TABS: 40.0000 mg | ORAL_TABLET | Freq: Every day | ORAL | 1 refills | Status: DC

## 2019-04-07 MED ORDER — GLIPIZIDE ER 10 MG PO TB24: 10.0000 mg | ORAL_TABLET | Freq: Every day | ORAL | 1 refills | Status: DC

## 2019-04-07 NOTE — Progress Notes (Signed)
Subjective:    Patient ID: Jacob Rios, male    DOB: [birth year], 74 y.o.   MRN: [id number]   Chief Complaint: Medical Management of Chronic Issues    HPI:  1. Essential hypertension No c/o chest papin, sob or headache. Does not check blood pressure at home vey often. BP Readings from Last 3 Encounters:  02/02/19 (!) 142/82  08/05/18 116/70  07/15/18 117/68     2. Coronary atherosclerosis of autologous vein bypass graft without angina He last saw cardiology on 02/02/19. He was doing weel. A echo cardiogram was ordered. Echo showed some decrease in left ventricular function and plan is to repeat echo in 1 year.   3. Type 2 diabetes mellitus without complication, without long-term current use of insulin (HCC) Fating blood sugars average belween 130 consistanetly.he denies any  Low blood sugars. Lab Results  Component Value Date   HGBA1C 5.6 12/23/2018     4. Mixed hyperlipidemia He tries to watch diet but does no dedicated exercise. Lab Results  Component Value Date   CHOL 99 (L) 12/23/2018   HDL 34 (L) 12/23/2018   LDLCALC 50 12/23/2018   TRIG 70 12/23/2018   CHOLHDL 2.9 12/23/2018     5. CKD stage 3 due to type 2 diabetes mellitus [facility]) Patient denies any peripheral edema. Lab Results  Component Value Date   CREATININE 1.27 12/23/2018     6. Gastroesophageal reflux disease without esophagitis Takes omeprazole daily. Keeps symptoms under control  7. BMI 35.0-35.9,adult Weight Is down 15lbs from last visit Wt Readings from Last 3 Encounters:  02/02/19 225 lb (102.1 kg)  08/05/18 240 lb (108.9 kg)  05/27/18 240 lb (108.9 kg)   BMI Readings from Last 3 Encounters:  02/02/19 31.38 kg/m  08/05/18 33.47 kg/m  05/27/18 33.47 kg/m   8. Elevated PSA Has appointment with DR. Wrenn tomorrow. Dr. Jeffie Pollock has been monitoring PSA for 2 years. Last PSA was 10.4.       Outpatient Encounter Medications as of 04/07/2019  Medication Sig  . aspirin 81 MG  tablet Take 81 mg by mouth daily.  Marland Kitchen atorvastatin (LIPITOR) 40 MG tablet Take 1 tablet (40 mg total) by mouth daily.  Marland Kitchen glipiZIDE (GLUCOTROL XL) 10 MG 24 hr tablet Take 1 tablet (10 mg total) by mouth daily.  Marland Kitchen glucose blood (ONETOUCH VERIO) test strip Test 1X per day and as needed  Dx 250.02  . Lancets (ONETOUCH ULTRASOFT) lancets Patient test 1X per day and prn  Dx 250.02  . lisinopril (ZESTRIL) 2.5 MG tablet Take 1 tablet (2.5 mg total) by mouth daily.  . metFORMIN (GLUCOPHAGE) 1000 MG tablet Take 1 tablet (1,000 mg total) by mouth 2 (two) times daily with a meal.  . metoprolol succinate (TOPROL-XL) 50 MG 24 hr tablet Take 1 tablet (50 mg total) by mouth daily.  . Omega-3 Fatty Acids (FISH OIL) 1000 MG CAPS Take 1 capsule by mouth daily.   Marland Kitchen omeprazole (PRILOSEC) 40 MG capsule Take 1 capsule (40 mg total) by mouth daily.  . vitamin C (ASCORBIC ACID) 500 MG tablet Take 500 mg by mouth daily.       Past Surgical History:  Procedure Laterality Date  . APPENDECTOMY    . CARDIAC CATHETERIZATION  2005  . CORONARY ARTERY BYPASS GRAFT  2000   3 vessel  . HERNIA REPAIR  1974  . PROSTATE BIOPSY  06/2017   x 4-5     Family History  Problem Relation Age of Onset  .  Lung cancer Mother   . Cancer Mother   . Dementia Father   . Hyperlipidemia Father   . Congestive Heart Failure Father   . Post-traumatic stress disorder Son   . Alcohol abuse Maternal Grandfather   . Cancer Maternal Grandfather 90       stomach cancer   . Heart attack Neg Hx   . Stroke Neg Hx     New complaints: None today  Social history: Lives with husband  Controlled substance contract: n/a    Review of Systems  Constitutional: Negative for diaphoresis.  Eyes: Negative for pain.  Respiratory: Negative for shortness of breath.   Cardiovascular: Negative for chest pain, palpitations and leg swelling.  Gastrointestinal: Negative for abdominal pain.  Endocrine: Negative for polydipsia.  Skin: Negative for  rash.  Neurological: Negative for dizziness, weakness and headaches.  Hematological: Does not bruise/bleed easily.  All other systems reviewed and are negative.      Objective:   Physical Exam Vitals and nursing note reviewed.  Constitutional:      Appearance: Normal appearance. He is well-developed.  HENT:     Head: Normocephalic.     Nose: Nose normal.  Eyes:     Pupils: Pupils are equal, round, and reactive to light.  Neck:     Thyroid: No thyroid mass or thyromegaly.     Vascular: No carotid bruit or JVD.     Trachea: Phonation normal.  Cardiovascular:     Rate and Rhythm: Normal rate and regular rhythm.  Pulmonary:     Effort: Pulmonary effort is normal. No respiratory distress.     Breath sounds: Normal breath sounds.  Abdominal:     General: Bowel sounds are normal.     Palpations: Abdomen is soft.     Tenderness: There is no abdominal tenderness.  Musculoskeletal:        General: Normal range of motion.     Cervical back: Normal range of motion and neck supple.  Lymphadenopathy:     Cervical: No cervical adenopathy.  Skin:    General: Skin is warm and dry.  Neurological:     Mental Status: He is alert and oriented to person, place, and time.  Psychiatric:        Behavior: Behavior normal.        Thought Content: Thought content normal.        Judgment: Judgment normal.      Hgba1c discussed at appointment 6.1     Assessment & Plan:  BRYCE CHEEVER comes in today with chief complaint of Medical Management of Chronic Issues   Diagnosis and orders addressed:  1. Essential hypertension **low sodium diet* - CBC with Differential/Platelet - CMP14+EGFR - metoprolol succinate (TOPROL-XL) 50 MG 24 hr tablet; Take 1 tablet (50 mg total) by mouth daily.  Dispense: 90 tablet; Refill: 1 - lisinopril (ZESTRIL) 2.5 MG tablet; Take 1 tablet (2.5 mg total) by mouth daily.  Dispense: 90 tablet; Refill: 1  2. Coronary atherosclerosis of autologous vein bypass graft  without angina  3. Type 2 diabetes mellitus without complication, without long-term current use of insulin (HCC) Continue to watch carbsin diet - Bayer DCA Hb A1c Waived - Microalbumin / creatinine urine ratio - glipiZIDE (GLUCOTROL XL) 10 MG 24 hr tablet; Take 1 tablet (10 mg total) by mouth daily.  Dispense: 90 tablet; Refill: 1 - metFORMIN (GLUCOPHAGE) 1000 MG tablet; Take 1 tablet (1,000 mg total) by mouth 2 (two) times daily with a meal.  Dispense:  180 tablet; Refill: 1  4. Mixed hyperlipidemia Low fat diet - Lipid panel - atorvastatin (LIPITOR) 40 MG tablet; Take 1 tablet (40 mg total) by mouth daily.  Dispense: 90 tablet; Refill: 1  5. CKD stage 3 due to type 2 diabetes mellitus ([place]) Labs o\pending  6. Gastroesophageal reflux disease without esophagitis Avoid spicy foods Do not eat 2 hours prior to bedtime - omeprazole (PRILOSEC) 40 MG capsule; Take 1 capsule (40 mg total) by mouth daily.  Dispense: 90 capsule; Refill: 1  7. BMI 35.0-35.9,adult Discussed diet and exercise for person with BMI >25 Will recheck weight in 3-6 months  8. Elevated PSA Keep follow up with Dr. Towanda Octave pending Health Maintenance reviewed Diet and exercise encouraged  Follow up plan: 3 months   [name], FNP

## 2019-04-07 NOTE — Patient Instructions (Signed)

## 2019-04-08 ENCOUNTER — Encounter: Payer: Self-pay | Admitting: Urology

## 2019-04-08 ENCOUNTER — Ambulatory Visit: Payer: Medicare PPO | Admitting: Urology

## 2019-04-08 VITALS — BP 148/73 | HR 64 | Temp 97.3°F | Ht 71.0 in | Wt 222.0 lb

## 2019-04-08 DIAGNOSIS — R972 Elevated prostate specific antigen [PSA]: Secondary | ICD-10-CM | POA: Diagnosis not present

## 2019-04-08 DIAGNOSIS — R351 Nocturia: Secondary | ICD-10-CM

## 2019-04-08 DIAGNOSIS — N403 Nodular prostate with lower urinary tract symptoms: Secondary | ICD-10-CM

## 2019-04-08 DIAGNOSIS — C61 Malignant neoplasm of prostate: Secondary | ICD-10-CM

## 2019-04-08 LAB — POCT URINALYSIS DIPSTICK
Bilirubin, UA: NEGATIVE
Blood, UA: NEGATIVE
Glucose, UA: NEGATIVE
Ketones, UA: NEGATIVE
Leukocytes, UA: NEGATIVE
Nitrite, UA: NEGATIVE
Protein, UA: NEGATIVE
Spec Grav, UA: <=1.005 — AB (ref 1.010–1.025)
Urobilinogen, UA: 0.2 E.U./dL
pH, UA: 5 (ref 5.0–8.0)

## 2019-04-08 NOTE — Progress Notes (Signed)
Subjective:  1. Cancer of prostate ([place])   2. Elevated PSA   3. Nodular prostate with lower urinary tract symptoms   4. Nocturia      Mr. Littau returns today in f/u for his history of low risk prostate cancer on surveillance. His PSA was up to 12.9 in March 2020 but is back down to 10.4 prior to this visit.  He had a UTI and went to the ER in late July 2020 and was given antibiotics but had Mx species on the culture.    He has not had any further UTI's.  He passed a stone last year.  His IPSS is 9. His UA today is clear.   He had an MRI fusion biopsy in 3/19 for surveillance of his low risk prostate cancer. He had a single core of 20% of gleason 6 disease in the left lateral apex. he had atypia in one core. His PSA was 8.5 in 9/19 and 9.0 in 1/19. He has no associated signs or symptoms.   He was found to have a T2a Nx Mx Gleason 6 prostate cancer with 5% in one left apical and one right apical core in 07/26/13. A repeat biopsy in 8/16 had a single core of Gleason 6 in the left mid lateral gland with 10% involvement. His PSA prior to this visit is up to 8.4 from 6.6 in 5/18. It had been stable for the last 2 years. His Prostate volume was 70ml initally and 36ml on the repeat. He has a history of a 28mm right base prostate nodule and BOO. He has no associated signs or symptoms.       ROS:  ROS:  A complete review of systems was performed.  All systems are negative except for pertinent findings as noted.   Review of Systems  Musculoskeletal: Positive for joint pain.    Allergies  Allergen Reactions  . Morphine Swelling and Other (See Comments)    PT DOESNT REMEMBER /     Outpatient Encounter Medications as of 04/08/2019  Medication Sig  . aspirin 81 MG tablet Take 81 mg by mouth daily.  Marland Kitchen atorvastatin (LIPITOR) 40 MG tablet Take 1 tablet (40 mg total) by mouth daily.  Marland Kitchen glipiZIDE (GLUCOTROL XL) 10 MG 24 hr tablet Take 1 tablet (10 mg total) by mouth daily.  Marland Kitchen glucose blood  (ONETOUCH VERIO) test strip Test 1X per day and as needed  Dx 250.02  . Lancets (ONETOUCH ULTRASOFT) lancets Patient test 1X per day and prn  Dx 250.02  . lisinopril (ZESTRIL) 2.5 MG tablet Take 1 tablet (2.5 mg total) by mouth daily.  . metFORMIN (GLUCOPHAGE) 1000 MG tablet Take 1 tablet (1,000 mg total) by mouth 2 (two) times daily with a meal.  . metoprolol succinate (TOPROL-XL) 50 MG 24 hr tablet Take 1 tablet (50 mg total) by mouth daily.  . Omega-3 Fatty Acids (FISH OIL) 1000 MG CAPS Take 1 capsule by mouth daily.   Marland Kitchen omeprazole (PRILOSEC) 40 MG capsule Take 1 capsule (40 mg total) by mouth daily.  . vitamin C (ASCORBIC ACID) 500 MG tablet Take 500 mg by mouth daily.     No facility-administered encounter medications on file as of 04/08/2019.    Past Medical History:  Diagnosis Date  . Cancer [facility])    prostate cancer  . Coronary artery disease    3V CABG 2000. Last heart cath 2005, with 2 patent grafts  . Diabetes mellitus   . Hyperlipidemia   .  Hypertension     Past Surgical History:  Procedure Laterality Date  . APPENDECTOMY    . CARDIAC CATHETERIZATION  2005  . CORONARY ARTERY BYPASS GRAFT  2000   3 vessel  . HERNIA REPAIR  1974  . PROSTATE BIOPSY  06/2017   x 4-5     Social History   Socioeconomic History  . Marital status: Married    Spouse name: Vaughan Basta   . Number of children: 3  . Years of education: Not on file  . Highest education level: Not on file  Occupational History  . Occupation: part-time Pharmacist, community    Comment: retired   Tobacco Use  . Smoking status: Former Smoker    Quit date: 01/13/1997    Years since quitting: 22.2  . Smokeless tobacco: Never Used  Substance and Sexual Activity  . Alcohol use: No  . Drug use: No  . Sexual activity: Not on file  Other Topics Concern  . Not on file  Social History Narrative  . Not on file   Social Determinants of Health   Financial Resource Strain:   . Difficulty of Paying Living Expenses:   Food  Insecurity:   . Worried About Charity fundraiser in the Last Year:   . Arboriculturist in the Last Year:   Transportation Needs:   . Film/video editor (Medical):   Marland Kitchen Lack of Transportation (Non-Medical):   Physical Activity:   . Days of Exercise per Week:   . Minutes of Exercise per Session:   Stress:   . Feeling of Stress :   Social Connections:   . Frequency of Communication with Friends and Family:   . Frequency of Social Gatherings with Friends and Family:   . Attends Religious Services:   . Active Member of Clubs or Organizations:   . Attends Archivist Meetings:   Marland Kitchen Marital Status:   Intimate Partner Violence:   . Fear of Current or Ex-Partner:   . Emotionally Abused:   Marland Kitchen Physically Abused:   . Sexually Abused:     Family History  Problem Relation Age of Onset  . Lung cancer Mother   . Cancer Mother   . Dementia Father   . Hyperlipidemia Father   . Congestive Heart Failure Father   . Post-traumatic stress disorder Son   . Alcohol abuse Maternal Grandfather   . Cancer Maternal Grandfather 90       stomach cancer   . Heart attack Neg Hx   . Stroke Neg Hx        Objective: Vitals:   04/08/19 1109  BP: (!) 148/73  Pulse: 64  Temp: (!) 97.3 F (36.3 C)     Physical Exam Vitals reviewed.  Constitutional:      Appearance: Normal appearance.  Genitourinary:    Comments: AP without lesions. NST without mass. Prostate 1.5+ with stable 68mm right base nodule. SV non-palpable.   Neurological:     Mental Status: He is alert.     Lab Results:  Results for orders placed or performed in visit on 04/08/19 (from the past 24 hour(s))  POCT urinalysis dipstick     Status: Abnormal   Collection Time: 04/08/19 11:18 AM  Result Value Ref Range   Color, UA yellow    Clarity, UA     Glucose, UA Negative Negative   Bilirubin, UA neg    Ketones, UA neg    Spec Grav, UA <=1.005 (A) 1.010 - 1.025  Blood, UA neg    pH, UA 5.0 5.0 - 8.0    Protein, UA Negative Negative   Urobilinogen, UA 0.2 0.2 or 1.0 E.U./dL   Nitrite, UA neg    Leukocytes, UA Negative Negative   Appearance clear    Odor      BMET Recent Labs    04/07/19 0814  NA 141  K 4.7  CL 103  CO2 23  GLUCOSE 99  BUN 17  CREATININE 1.36*  CALCIUM 9.5   PSA PSA  Date Value Ref Range Status  03/30/2019 10.4 (H) < OR = 4.0 ng/mL Final    Comment:    The total PSA value from this assay system is  standardized against the WHO standard. The test  result will be approximately 20% lower when compared  to the equimolar-standardized total PSA (Beckman  Coulter). Comparison of serial PSA results should be  interpreted with this fact in mind. . This test was performed using the Siemens  chemiluminescent method. Values obtained from  different assay methods cannot be used interchangeably. PSA levels, regardless of value, should not be interpreted as absolute evidence of the presence or absence of disease.   11/18/2013 5.0 (H) 0.0 - 4.0 ng/mL Final    Comment:    Roche ECLIA methodology. According to the American Urological Association, Serum PSA should decrease and remain at undetectable levels after radical prostatectomy. The AUA defines biochemical recurrence as an initial PSA value 0.2 ng/mL or greater followed by a subsequent confirmatory PSA value 0.2 ng/mL or greater. Values obtained with different assay methods or kits cannot be used interchangeably. Results cannot be interpreted as absolute evidence of the presence or absence of malignant disease.   05/20/2013 5.3 (H) 0.0 - 4.0 ng/mL Final    Comment:    Roche ECLIA methodology. According to the American Urological Association, Serum PSA should decrease and remain at undetectable levels after radical prostatectomy. The AUA defines biochemical recurrence as an initial PSA value 0.2 ng/mL or greater followed by a subsequent confirmatory PSA value 0.2 ng/mL or greater. Values obtained with  different assay methods or kits cannot be used interchangeably. Results cannot be interpreted as absolute evidence of the presence or absence of malignant disease.   No results found for: TESTOSTERONE    Studies/Results: No results found.    Assessment & Plan: Prostate cancer.  His PSA is down from the high.  He will return in 6 months with a PSA.  Prostate Nodule with obstruction.  His LUTS are reasonably stable and the nodule is unchanged.   No orders of the defined types were placed in this encounter.    Orders Placed This Encounter  Procedures  . PSA    Standing Status:   Future    Standing Expiration Date:   04/07/2020  . POCT urinalysis dipstick      Return in about 6 months (around 10/09/2019) for With PSA. .   CC: [name], FNP      [name] 04/08/2019

## 2019-04-11 ENCOUNTER — Encounter: Payer: Self-pay | Admitting: General Practice

## 2019-04-14 ENCOUNTER — Telehealth: Payer: Self-pay | Admitting: Physician Assistant

## 2019-04-14 DIAGNOSIS — I771 Stricture of artery: Secondary | ICD-10-CM

## 2019-04-14 NOTE — Addendum Note (Signed)
Addended by: Thompson Grayer on: 04/14/2019 03:11 PM   Modules accepted: Orders

## 2019-04-14 NOTE — Telephone Encounter (Signed)
Please call patient to help him understand what he needs to do.  If he needs to see Dr. Fletcher Anon, another referral would need to be submitted.

## 2019-04-14 NOTE — Telephone Encounter (Signed)
I spoke with patient and reviewed reason for referral to Dr Fletcher Anon. He would like to proceed with referral.  New order placed. I told patient someone would call him to schedule appointment. Best number to reach patient is [phone].

## 2019-04-25 ENCOUNTER — Telehealth: Payer: Self-pay | Admitting: Cardiovascular Disease

## 2019-04-25 NOTE — Telephone Encounter (Signed)
Wife of patient called. She wanted to know if she would be able to be present during her Husband's upcoming appointment. He is hard of hearing and may miss what the doctor has to say. Please leave a message on the wife's machine if she is not available

## 2019-04-25 NOTE — Telephone Encounter (Signed)
Called and spoke with pt's wife, notified that it was okay for her to accompany him to his appt. Wife verbalized understanding and had no other questions at this time.

## 2019-04-26 ENCOUNTER — Ambulatory Visit: Payer: Medicare PPO | Admitting: Cardiovascular Disease

## 2019-04-26 ENCOUNTER — Other Ambulatory Visit: Payer: Self-pay

## 2019-04-26 ENCOUNTER — Encounter: Payer: Self-pay | Admitting: Cardiovascular Disease

## 2019-04-26 VITALS — BP 130/70 | HR 64 | Temp 97.5°F | Ht 71.0 in | Wt 224.4 lb

## 2019-04-26 DIAGNOSIS — I10 Essential (primary) hypertension: Secondary | ICD-10-CM

## 2019-04-26 DIAGNOSIS — E785 Hyperlipidemia, unspecified: Secondary | ICD-10-CM | POA: Diagnosis not present

## 2019-04-26 DIAGNOSIS — I2510 Atherosclerotic heart disease of native coronary artery without angina pectoris: Secondary | ICD-10-CM | POA: Diagnosis not present

## 2019-04-26 DIAGNOSIS — I739 Peripheral vascular disease, unspecified: Secondary | ICD-10-CM | POA: Diagnosis not present

## 2019-04-26 NOTE — Patient Instructions (Signed)
Medication Instructions:  No changes *If you need a refill on your cardiac medications before your next appointment, please call your pharmacy*   Lab Work: None ordered If you have labs (blood work) drawn today and your tests are completely normal, you will receive your results only by: . MyChart Message (if you have MyChart) OR . A paper copy in the mail If you have any lab test that is abnormal or we need to change your treatment, we will call you to review the results.   Testing/Procedures: None ordered   Follow-Up: At [facility], you and your health needs are our priority.  As part of our continuing mission to provide you with exceptional heart care, we have created designated Provider Care Teams.  These Care Teams include your primary Cardiologist (physician) and Advanced Practice Providers (APPs -  Physician Assistants and Nurse Practitioners) who all work together to provide you with the care you need, when you need it.  We recommend signing up for the patient portal called "MyChart".  Sign up information is provided on this After Visit Summary.  MyChart is used to connect with patients for Virtual Visits (Telemedicine).  Patients are able to view lab/test results, encounter notes, upcoming appointments, etc.  Non-urgent messages can be sent to your provider as well.   To learn more about what you can do with MyChart, go to [email or web address].    Your next appointment:   Follow up as needed 

## 2019-04-26 NOTE — Progress Notes (Signed)
Cardiology Office Note   Date:  04/26/2019   ID:  Jacob Rios, DOB 06-18-1945, MRN [id number]  PCP:  [name], FNP  Cardiologist: Dr. Angelena Form  No chief complaint on file.     History of Present Illness: Jacob Rios is a 74 y.o. male who was referred by Jacob Rios for evaluation management of peripheral arterial disease.  He has known history of coronary artery disease status post MI followed by CABG in 2000, hypertension, chronic kidney disease, hyperlipidemia, previous tobacco use and diabetes mellitus. Given his risk factors and previous tobacco use.  He underwent recent screening for abdominal aortic aneurysm.  There was mild ectasia of the abdominal aorta at 2.9 cm.  In addition, there was evidence of significant left common iliac artery stenosis with peak velocity of 398.  He denies any leg claudication or lower extremity ulceration.  No chest pain or shortness of breath.  He is active with no symptoms.   Past Medical History:  Diagnosis Date  . Cancer [facility])    prostate cancer  . Coronary artery disease    3V CABG 2000. Last heart cath 2005, with 2 patent grafts  . Diabetes mellitus   . Hyperlipidemia   . Hypertension     Past Surgical History:  Procedure Laterality Date  . APPENDECTOMY    . CARDIAC CATHETERIZATION  2005  . CORONARY ARTERY BYPASS GRAFT  2000   3 vessel  . HERNIA REPAIR  1974  . PROSTATE BIOPSY  06/2017   x 4-5      Current Outpatient Medications  Medication Sig Dispense Refill  . aspirin 81 MG tablet Take 81 mg by mouth daily.    Marland Kitchen atorvastatin (LIPITOR) 40 MG tablet Take 1 tablet (40 mg total) by mouth daily. 90 tablet 1  . glipiZIDE (GLUCOTROL XL) 10 MG 24 hr tablet Take 1 tablet (10 mg total) by mouth daily. 90 tablet 1  . glucose blood (ONETOUCH VERIO) test strip Test 1X per day and as needed  Dx 250.02 100 each 12  . Lancets (ONETOUCH ULTRASOFT) lancets Patient test 1X per day and prn  Dx 250.02 100 each 12  .  lisinopril (ZESTRIL) 2.5 MG tablet Take 1 tablet (2.5 mg total) by mouth daily. 90 tablet 1  . metFORMIN (GLUCOPHAGE) 1000 MG tablet Take 1 tablet (1,000 mg total) by mouth 2 (two) times daily with a meal. 180 tablet 1  . metoprolol succinate (TOPROL-XL) 50 MG 24 hr tablet Take 1 tablet (50 mg total) by mouth daily. 90 tablet 1  . Omega-3 Fatty Acids (FISH OIL) 1000 MG CAPS Take 1 capsule by mouth daily.     Marland Kitchen omeprazole (PRILOSEC) 40 MG capsule Take 1 capsule (40 mg total) by mouth daily. 90 capsule 1  . vitamin C (ASCORBIC ACID) 500 MG tablet Take 500 mg by mouth daily.       No current facility-administered medications for this visit.    Allergies:   Morphine    Social History:  The patient  reports that he quit smoking about 22 years ago. He has never used smokeless tobacco. He reports that he does not drink alcohol or use drugs.   Family History:  The patient's family history includes Alcohol abuse in his maternal grandfather; Cancer in his mother; Cancer (age of onset: 35) in his maternal grandfather; Congestive Heart Failure in his father; Dementia in his father; Hyperlipidemia in his father; Lung cancer in his mother; Post-traumatic stress disorder in his  son.    ROS:  Please see the history of present illness.   Otherwise, review of systems are positive for none.   All other systems are reviewed and negative.    PHYSICAL EXAM: VS:  BP 130/70   Pulse 64   Temp (!) 97.5 F (36.4 C)   Ht 5\' 11"  (1.803 m)   Wt 224 lb 6.4 oz (101.8 kg)   SpO2 90%   BMI 31.30 kg/m  , BMI Body mass index is 31.3 kg/m. GEN: Well nourished, well developed, in no acute distress  HEENT: normal  Neck: no JVD, carotid bruits, or masses Cardiac: RRR; no murmurs, rubs, or gallops,no edema  Respiratory:  clear to auscultation bilaterally, normal work of breathing GI: soft, nontender, nondistended, + BS MS: no deformity or atrophy  Skin: warm and dry, no rash Neuro:  Strength and sensation are  intact Psych: euthymic mood, full affect Vascular: Femoral pulses +2 bilaterally.  Posterior tibial +2 bilaterally.  Dorsalis pedis is +1 bilaterally.   EKG:  EKG is not ordered today.    Recent Labs: 04/07/2019: ALT 13; BUN 17; Creatinine, Ser 1.36; Hemoglobin 13.5; Platelets 191; Potassium 4.7; Sodium 141    Lipid Panel    Component Value Date/Time   CHOL 106 04/07/2019 0814   CHOL 107 07/30/2012 1158   TRIG 57 04/07/2019 0814   TRIG 96 07/30/2012 1158   HDL 34 (L) 04/07/2019 0814   HDL 24 (L) 07/30/2012 1158   CHOLHDL 3.1 04/07/2019 0814   LDLCALC 59 04/07/2019 0814   LDLCALC 64 07/30/2012 1158      Wt Readings from Last 3 Encounters:  04/26/19 224 lb 6.4 oz (101.8 kg)  04/08/19 222 lb (100.7 kg)  04/07/19 222 lb (100.7 kg)       No flowsheet data found.    ASSESSMENT AND PLAN:  1.  Peripheral arterial disease: The patient has evidence of left common iliac artery stenosis on duplex.  However, his left femoral pulse is normal and distal pulses are palpable.  In addition, he has no symptoms of claudication whatsoever.  Thus, I recommend continuing medical therapy.  Given mild ectasia in the aorta, recommend repeat aortoiliac duplex in 1 year. Otherwise, the patient can follow-up with me if the stenosis worsens or if he develops symptoms.  2.  Coronary artery disease involving native coronary arteries without angina: Continue aggressive medical therapy.  3.  Essential hypertension: Blood pressure is controlled on current medications.  4.  Hyperlipidemia: Currently on atorvastatin 40 mg daily.  Most recent lipid profile showed an LDL of 59.    Disposition:   FU with me as needed.  Signed,  Kathlyn Sacramento, MD  04/26/2019 3:33 PM    [place]

## 2019-05-30 ENCOUNTER — Telehealth: Payer: Self-pay | Admitting: Nurse Practitioner

## 2019-05-30 NOTE — Telephone Encounter (Signed)
  Incoming Patient Call  05/30/2019  What symptoms do you have? Sinus congestion, nose hurts, one side of head hurts  How long have you been sick? A couple of days  Have you been seen for this problem? no  If your provider decides to give you a prescription, which pharmacy would you like for it to be sent to? [facility] would like medication sent in for this sinus infection    Patient informed that this information will be sent to the clinical staff for review and that they should receive a follow up call.

## 2019-05-30 NOTE — Telephone Encounter (Signed)
Offered an appointment for tomorrow and patient agreed and then stated he would go somewhere else to get evaluated.

## 2019-06-21 ENCOUNTER — Ambulatory Visit (INDEPENDENT_AMBULATORY_CARE_PROVIDER_SITE_OTHER): Payer: Medicare PPO

## 2019-06-21 VITALS — BP 130/74

## 2019-06-21 DIAGNOSIS — Z0000 Encounter for general adult medical examination without abnormal findings: Secondary | ICD-10-CM

## 2019-06-21 NOTE — Patient Instructions (Addendum)
  [place] Maintenance Summary and Written Plan of Care  Jacob Rios ,  Thank you for allowing me to perform your Medicare Annual Wellness Visit and for your ongoing commitment to your health.   Health Maintenance & Immunization History Health Maintenance  Topic Date Due  . COVID-19 Vaccine (1) Never done  . COLONOSCOPY  Never done  . OPHTHALMOLOGY EXAM  06/16/2017  . INFLUENZA VACCINE  08/14/2019  . HEMOGLOBIN A1C  10/08/2019  . FOOT EXAM  12/21/2019  . TETANUS/TDAP  07/31/2022  . Hepatitis C Screening  Completed  . PNA vac Low Risk Adult  Completed   Immunization History  Administered Date(s) Administered  . Fluad Quad(high Dose 65+) 10/26/2018  . H1N1 11/10/2007  . Influenza, High Dose Seasonal PF 10/26/2017  . Influenza,inj,Quad PF,6+ Mos 11/03/2012, 10/24/2013, 10/16/2014, 10/16/2015, 10/20/2016  . Pneumococcal Conjugate-13 07/20/2014  . Pneumococcal Polysaccharide-23 07/30/2012  . Tdap 07/30/2012    These are the patient goals that we discussed: Goals Addressed            This Visit's Progress   . Prevent falls   On track    Stay active         This is a list of Health Maintenance Items that are overdue or due now: Health Maintenance Due  Topic Date Due  . COVID-19 Vaccine (1) Never done  . COLONOSCOPY  Never done  . OPHTHALMOLOGY EXAM  06/16/2017     Orders/Referrals Placed Today: No orders of the defined types were placed in this encounter.  (Contact our referral department at [phone] if you have not spoken with someone about your referral appointment within the next 5 days)    Follow-up Plan  Scheduled with [name] 07/08/2019 at 10:00am.

## 2019-06-21 NOTE — Progress Notes (Signed)
MEDICARE ANNUAL WELLNESS VISIT  06/21/2019  Telephone Visit Disclaimer This Medicare AWV was conducted by telephone due to national recommendations for restrictions regarding the COVID-19 Pandemic (e.g. social distancing).  I verified, using two identifiers, that I am speaking with Jacob Rios or their authorized healthcare agent. I discussed the limitations, risks, security, and privacy concerns of performing an evaluation and management service by telephone and the potential availability of an in-person appointment in the future. The patient expressed understanding and agreed to proceed.   Subjective:  Jacob Rios is a 74 y.o. male patient of Jacob Rios, [place] who had a Medicare Annual Wellness Visit today via telephone. Hesham is Retired and lives with their spouse. he has three children. he reports that he is socially active and does interact with friends/family regularly. he is minimally physically active and enjoys gardening and fishing.  Patient Care Team: Jacob Pretty, FNP as PCP - General (Family Medicine) Burnell Blanks, MD as PCP - Cardiology (Cardiology) [name], MD as Attending Physician (Urology) Harlen Labs, MD as Referring Physician (Optometry)  Advanced Directives 06/21/2019 07/15/2018 05/27/2018 10/23/2015  Does Patient Have a Medical Advance Directive? Yes No No No  Type of Advance Directive Living will - - -  Does patient want to make changes to medical advance directive? No - Patient declined - - -  Would patient like information on creating a medical advance directive? - - No - Patient declined No - patient declined information    Hospital Utilization Over the Past 12 Months: # of hospitalizations or ER visits: 0 # of surgeries: 0  Review of Systems    Patient reports that his overall health is unchanged compared to last year.    Patient Reported Readings (BP-130/74, Pulse, CBG-115, Weight, etc)   Pain  Assessment Pain : No/denies pain     Current Medications & Allergies (verified) Allergies as of 06/21/2019      Reactions   Morphine Swelling, Other (See Comments)   PT DOESNT REMEMBER /       Medication List       Accurate as of June 21, 2019  8:31 AM. If you have any questions, ask your nurse or doctor.        aspirin 81 MG tablet Take 81 mg by mouth daily.   atorvastatin 40 MG tablet Commonly known as: LIPITOR Take 1 tablet (40 mg total) by mouth daily.   Fish Oil 1000 MG Caps Take 1 capsule by mouth daily.   glipiZIDE 10 MG 24 hr tablet Commonly known as: GLUCOTROL XL Take 1 tablet (10 mg total) by mouth daily.   glucose blood test strip Commonly known as: OneTouch Verio Test 1X per day and as needed  Dx 250.02   lisinopril 2.5 MG tablet Commonly known as: ZESTRIL Take 1 tablet (2.5 mg total) by mouth daily.   metFORMIN 1000 MG tablet Commonly known as: GLUCOPHAGE Take 1 tablet (1,000 mg total) by mouth 2 (two) times daily with a meal.   metoprolol succinate 50 MG 24 hr tablet Commonly known as: TOPROL-XL Take 1 tablet (50 mg total) by mouth daily.   omeprazole 40 MG capsule Commonly known as: PRILOSEC Take 1 capsule (40 mg total) by mouth daily.   onetouch ultrasoft lancets Patient test 1X per day and prn  Dx 250.02   vitamin C 500 MG tablet Commonly known as: ASCORBIC ACID Take 500 mg by mouth daily.       History (  reviewed): Past Medical History:  Diagnosis Date  . Cancer [facility])    prostate cancer  . Coronary artery disease    3V CABG 2000. Last heart cath 2005, with 2 patent grafts  . Diabetes mellitus   . Hyperlipidemia   . Hypertension    Past Surgical History:  Procedure Laterality Date  . APPENDECTOMY    . CARDIAC CATHETERIZATION  2005  . CORONARY ARTERY BYPASS GRAFT  2000   3 vessel  . HERNIA REPAIR  1974  . PROSTATE BIOPSY  06/2017   x 4-5    Family History  Problem Relation Age of Onset  . Lung cancer Mother   . Cancer  Mother   . Dementia Father   . Hyperlipidemia Father   . Congestive Heart Failure Father   . Post-traumatic stress disorder Son   . Alcohol abuse Maternal Grandfather   . Cancer Maternal Grandfather 90       stomach cancer   . Heart attack Neg Hx   . Stroke Neg Hx    Social History   Socioeconomic History  . Marital status: Married    Spouse name: Jacob Rios   . Number of children: 3  . Years of education: Not on file  . Highest education level: Not on file  Occupational History  . Occupation: part-time Pharmacist, community    Comment: retired   Tobacco Use  . Smoking status: Former Smoker    Quit date: 01/13/1997    Years since quitting: 22.4  . Smokeless tobacco: Never Used  Substance and Sexual Activity  . Alcohol use: No  . Drug use: No  . Sexual activity: Not on file  Other Topics Concern  . Not on file  Social History Narrative  . Not on file   Social Determinants of Health   Financial Resource Strain:   . Difficulty of Paying Living Expenses:   Food Insecurity:   . Worried About Charity fundraiser in the Last Year:   . Arboriculturist in the Last Year:   Transportation Needs:   . Film/video editor (Medical):   Marland Kitchen Lack of Transportation (Non-Medical):   Physical Activity:   . Days of Exercise per Week:   . Minutes of Exercise per Session:   Stress:   . Feeling of Stress :   Social Connections:   . Frequency of Communication with Friends and Family:   . Frequency of Social Gatherings with Friends and Family:   . Attends Religious Services:   . Active Member of Clubs or Organizations:   . Attends Archivist Meetings:   Marland Kitchen Marital Status:     Activities of Daily Living In your present state of health, do you have any difficulty performing the following activities: 06/21/2019  Hearing? Y  Vision? N  Difficulty concentrating or making decisions? N  Walking or climbing stairs? N  Dressing or bathing? N  Doing errands, shopping? N  Preparing Food and eating  ? N  Using the Toilet? N  In the past six months, have you accidently leaked urine? N  Do you have problems with loss of bowel control? N  Managing your Medications? N  Managing your Finances? N  Housekeeping or managing your Housekeeping? N  Some recent data might be hidden    Patient Education/ Literacy How often do you need to have someone help you when you read instructions, pamphlets, or other written materials from your doctor or pharmacy?: 1 - Never What is the last  grade level you completed in school?: GED  Exercise Current Exercise Habits: Home exercise routine, Type of exercise: walking, Time (Minutes): 60, Frequency (Times/Week): 7, Weekly Exercise (Minutes/Week): 420, Intensity: Mild, Exercise limited by: None identified  Diet Patient reports consuming 3 meals a day and 2 snack(s) a day Patient reports that his primary diet is: Diabetic Patient reports that she does have regular access to food.   Depression Screen PHQ 2/9 Scores 06/21/2019 04/07/2019 12/21/2018 09/21/2018 08/05/2018 06/18/2018 05/27/2018  PHQ - 2 Score 0 0 0 0 0 0 0  PHQ- 9 Score - - - - - - -     Fall Risk Fall Risk  04/07/2019 12/21/2018 09/21/2018 08/05/2018 05/27/2018  Falls in the past year? 0 0 0 0 0     Objective:  Jacob Rios seemed alert and oriented and he participated appropriately during our telephone visit.  Blood Pressure Weight BMI  BP Readings from Last 3 Encounters:  06/21/19 130/74  04/26/19 130/70  04/08/19 (!) 148/73   Wt Readings from Last 3 Encounters:  04/26/19 224 lb 6.4 oz (101.8 kg)  04/08/19 222 lb (100.7 kg)  04/07/19 222 lb (100.7 kg)   BMI Readings from Last 1 Encounters:  04/26/19 31.30 kg/m    *Unable to obtain current vital signs, weight, and BMI due to telephone visit type  Hearing/Vision  . Erdem did  seem to have difficulty with hearing/understanding during the telephone conversation . Reports that he has had a formal eye exam by an eye care professional within  the past year . Reports that he has had a formal hearing evaluation within the past year *Unable to fully assess hearing and vision during telephone visit type  Cognitive Function: 6CIT Screen 06/21/2019 05/27/2018  What Year? 0 points 0 points  What month? 0 points 0 points  What time? 0 points 0 points  Count back from 20 0 points 0 points  Months in reverse 0 points 0 points  Repeat phrase 0 points 0 points  Total Score 0 0   (Normal:0-7, Significant for Dysfunction: >8)  Normal Cognitive Function Screening: Yes   Immunization & Health Maintenance Record Immunization History  Administered Date(s) Administered  . Fluad Quad(high Dose 65+) 10/26/2018  . H1N1 11/10/2007  . Influenza, High Dose Seasonal PF 10/26/2017  . Influenza,inj,Quad PF,6+ Mos 11/03/2012, 10/24/2013, 10/16/2014, 10/16/2015, 10/20/2016  . Pneumococcal Conjugate-13 07/20/2014  . Pneumococcal Polysaccharide-23 07/30/2012  . Tdap 07/30/2012    Health Maintenance  Topic Date Due  . COVID-19 Vaccine (1) Never done  . COLONOSCOPY  Never done  . OPHTHALMOLOGY EXAM  06/16/2017  . INFLUENZA VACCINE  08/14/2019  . HEMOGLOBIN A1C  10/08/2019  . FOOT EXAM  12/21/2019  . TETANUS/TDAP  07/31/2022  . Hepatitis C Screening  Completed  . PNA vac Low Risk Adult  Completed       Assessment  This is a routine wellness examination for KERT SHACKETT.  Health Maintenance: Due or Overdue Health Maintenance Due  Topic Date Due  . COVID-19 Vaccine (1) Never done  . COLONOSCOPY  Never done  . OPHTHALMOLOGY EXAM  06/16/2017    Jacob Rios does not need a referral for Community Assistance: Care Management:   no Social Work:    no Prescription Assistance:  no Nutrition/Diabetes Education:  no   Plan:  Personalized Goals Goals Addressed            This Visit's Progress   . Prevent falls   On track  Stay active       Personalized Health Maintenance & Screening Recommendations    Lung Cancer  Screening Recommended: no (Low Dose CT Chest recommended if Age 19-80 years, 30 pack-year currently smoking OR have quit w/in past 15 years) Hepatitis C Screening recommended: no HIV Screening recommended: no  Advanced Directives: Written information was not prepared per patient's request.  Referrals & Orders No orders of the defined types were placed in this encounter.   Follow-up Plan . Follow-up with Jacob Pretty, FNP as planned . Schedule 07/08/2019    I have personally reviewed and noted the following in the patient's chart:   . Medical and social history . Use of alcohol, tobacco or illicit drugs  . Current medications and supplements . Functional ability and status . Nutritional status . Physical activity . Advanced directives . List of other physicians . Hospitalizations, surgeries, and ER visits in previous 12 months . Vitals . Screenings to include cognitive, depression, and falls . Referrals and appointments  In addition, I have reviewed and discussed with Jacob Rios certain preventive protocols, quality metrics, and best practice recommendations. A written personalized care plan for preventive services as well as general preventive health recommendations is available and can be mailed to the patient at his request.      Maud Deed [facility]  06/20/3417

## 2019-07-08 ENCOUNTER — Encounter: Payer: Self-pay | Admitting: Nurse Practitioner

## 2019-07-08 ENCOUNTER — Other Ambulatory Visit: Payer: Self-pay

## 2019-07-08 ENCOUNTER — Ambulatory Visit: Payer: Medicare PPO | Admitting: Nurse Practitioner

## 2019-07-08 VITALS — BP 120/68 | HR 65 | Temp 97.8°F | Resp 20 | Ht 71.0 in | Wt 225.0 lb

## 2019-07-08 DIAGNOSIS — E782 Mixed hyperlipidemia: Secondary | ICD-10-CM

## 2019-07-08 DIAGNOSIS — E1122 Type 2 diabetes mellitus with diabetic chronic kidney disease: Secondary | ICD-10-CM | POA: Diagnosis not present

## 2019-07-08 DIAGNOSIS — E119 Type 2 diabetes mellitus without complications: Secondary | ICD-10-CM

## 2019-07-08 DIAGNOSIS — N183 Chronic kidney disease, stage 3 unspecified: Secondary | ICD-10-CM

## 2019-07-08 DIAGNOSIS — I25810 Atherosclerosis of coronary artery bypass graft(s) without angina pectoris: Secondary | ICD-10-CM | POA: Diagnosis not present

## 2019-07-08 DIAGNOSIS — K219 Gastro-esophageal reflux disease without esophagitis: Secondary | ICD-10-CM | POA: Diagnosis not present

## 2019-07-08 DIAGNOSIS — Z6835 Body mass index (BMI) 35.0-35.9, adult: Secondary | ICD-10-CM | POA: Diagnosis not present

## 2019-07-08 DIAGNOSIS — I10 Essential (primary) hypertension: Secondary | ICD-10-CM

## 2019-07-08 LAB — BAYER DCA HB A1C WAIVED: HB A1C (BAYER DCA - WAIVED): 6 % (ref <7.0)

## 2019-07-08 MED ORDER — LISINOPRIL 2.5 MG PO TABS: 2.5000 mg | ORAL_TABLET | Freq: Every day | ORAL | 1 refills | Status: DC

## 2019-07-08 MED ORDER — GLIPIZIDE ER 10 MG PO TB24: 10.0000 mg | ORAL_TABLET | Freq: Every day | ORAL | 1 refills | Status: DC

## 2019-07-08 MED ORDER — METFORMIN HCL 1000 MG PO TABS: 1000.0000 mg | ORAL_TABLET | Freq: Two times a day (BID) | ORAL | 1 refills | Status: DC

## 2019-07-08 MED ORDER — OMEPRAZOLE 40 MG PO CPDR: 40.0000 mg | DELAYED_RELEASE_CAPSULE | Freq: Every day | ORAL | 1 refills | Status: DC

## 2019-07-08 MED ORDER — ATORVASTATIN CALCIUM 40 MG PO TABS: 40.0000 mg | ORAL_TABLET | Freq: Every day | ORAL | 1 refills | Status: DC

## 2019-07-08 MED ORDER — METOPROLOL SUCCINATE ER 50 MG PO TB24: 50.0000 mg | ORAL_TABLET | Freq: Every day | ORAL | 1 refills | Status: DC

## 2019-07-08 NOTE — Patient Instructions (Signed)
Diabetes Mellitus and Foot Care Foot care is an important part of your health, especially when you have diabetes. Diabetes may cause you to have problems because of poor blood flow (circulation) to your feet and legs, which can cause your skin to:  Become thinner and drier.  Break more easily.  Heal more slowly.  Peel and crack. You may also have nerve damage (neuropathy) in your legs and feet, causing decreased feeling in them. This means that you may not notice minor injuries to your feet that could lead to more serious problems. Noticing and addressing any potential problems early is the best way to prevent future foot problems. How to care for your feet Foot hygiene  Wash your feet daily with warm water and mild soap. Do not use hot water. Then, pat your feet and the areas between your toes until they are completely dry. Do not soak your feet as this can dry your skin.  Trim your toenails straight across. Do not dig under them or around the cuticle. File the edges of your nails with an emery board or nail file.  Apply a moisturizing lotion or petroleum jelly to the skin on your feet and to dry, brittle toenails. Use lotion that does not contain alcohol and is unscented. Do not apply lotion between your toes. Shoes and socks  Wear clean socks or stockings every day. Make sure they are not too tight. Do not wear knee-high stockings since they may decrease blood flow to your legs.  Wear shoes that fit properly and have enough cushioning. Always look in your shoes before you put them on to be sure there are no objects inside.  To break in new shoes, wear them for just a few hours a day. This prevents injuries on your feet. Wounds, scrapes, corns, and calluses  Check your feet daily for blisters, cuts, bruises, sores, and redness. If you cannot see the bottom of your feet, use a mirror or ask someone for help.  Do not cut corns or calluses or try to remove them with medicine.  If you  find a minor scrape, cut, or break in the skin on your feet, keep it and the skin around it clean and dry. You may clean these areas with mild soap and water. Do not clean the area with peroxide, alcohol, or iodine.  If you have a wound, scrape, corn, or callus on your foot, look at it several times a day to make sure it is healing and not infected. Check for: ? Redness, swelling, or pain. ? Fluid or blood. ? Warmth. ? Pus or a bad smell. General instructions  Do not cross your legs. This may decrease blood flow to your feet.  Do not use heating pads or hot water bottles on your feet. They may burn your skin. If you have lost feeling in your feet or legs, you may not know this is happening until it is too late.  Protect your feet from hot and cold by wearing shoes, such as at the beach or on hot pavement.  Schedule a complete foot exam at least once a year (annually) or more often if you have foot problems. If you have foot problems, report any cuts, sores, or bruises to your health care provider immediately. Contact a health care provider if:  You have a medical condition that increases your risk of infection and you have any cuts, sores, or bruises on your feet.  You have an injury that is not   healing.  You have redness on your legs or feet.  You feel burning or tingling in your legs or feet.  You have pain or cramps in your legs and feet.  Your legs or feet are numb.  Your feet always feel cold.  You have pain around a toenail. Get help right away if:  You have a wound, scrape, corn, or callus on your foot and: ? You have pain, swelling, or redness that gets worse. ? You have fluid or blood coming from the wound, scrape, corn, or callus. ? Your wound, scrape, corn, or callus feels warm to the touch. ? You have pus or a bad smell coming from the wound, scrape, corn, or callus. ? You have a fever. ? You have a red line going up your leg. Summary  Check your feet every day  for cuts, sores, red spots, swelling, and blisters.  Moisturize feet and legs daily.  Wear shoes that fit properly and have enough cushioning.  If you have foot problems, report any cuts, sores, or bruises to your health care provider immediately.  Schedule a complete foot exam at least once a year (annually) or more often if you have foot problems. This information is not intended to replace advice given to you by your health care provider. Make sure you discuss any questions you have with your health care provider. Document Revised: 09/22/2018 Document Reviewed: 02/01/2016 Elsevier Patient Education  2020 Elsevier Inc.  

## 2019-07-08 NOTE — Progress Notes (Signed)
Subjective:    Patient ID: Jacob Rios, male    DOB: [birth year], 74 y.o.   MRN: [id number]   Chief Complaint: Medical Management of Chronic Issues    HPI:  1. Essential hypertension No c/o chest pain, sob or headache. Does not check blood pressure at home. BP Readings from Last 3 Encounters:  07/08/19 120/68  06/21/19 130/74  04/26/19 130/70     2. Coronary atherosclerosis of autologous vein bypass graft without angina Saw Cardiology in January. According to office note. No changes were made to plan of care., he sent him to have circulatory studies on lower ext and they were normal.  3. Mixed hyperlipidemia Tries to watch diet and stays very active. Lab Results  Component Value Date   CHOL 106 04/07/2019   HDL 34 (L) 04/07/2019   LDLCALC 59 04/07/2019   TRIG 57 04/07/2019   CHOLHDL 3.1 04/07/2019     4. Type 2 diabetes mellitus without complication, without long-term current use of insulin (HCC) Fasting blood sugars are running around 100-120. Denies nay low blood sugars. Lab Results  Component Value Date   HGBA1C 6.1 04/07/2019     5. Gastroesophageal reflux disease without esophagitis He is on omeprazole and that is working well.  6. CKD stage 3 due to type 2 diabetes mellitus ([place]) No problems voiding Lab Results  Component Value Date   CREATININE 1.36 (H) 04/07/2019   BUN 17 04/07/2019   NA 141 04/07/2019   K 4.7 04/07/2019   CL 103 04/07/2019   CO2 23 04/07/2019     7. BMI 35.0-35.9,adult No recent weight changes Wt Readings from Last 3 Encounters:  07/08/19 225 lb (102.1 kg)  04/26/19 224 lb 6.4 oz (101.8 kg)  04/08/19 222 lb (100.7 kg)   BMI Readings from Last 3 Encounters:  07/08/19 31.38 kg/m  04/26/19 31.30 kg/m  04/08/19 30.96 kg/m       Outpatient Encounter Medications as of 07/08/2019  Medication Sig  . aspirin 81 MG tablet Take 81 mg by mouth daily.  Marland Kitchen atorvastatin (LIPITOR) 40 MG tablet Take 1 tablet (40 mg total) by  mouth daily.  Marland Kitchen glipiZIDE (GLUCOTROL XL) 10 MG 24 hr tablet Take 1 tablet (10 mg total) by mouth daily.  Marland Kitchen glucose blood (ONETOUCH VERIO) test strip Test 1X per day and as needed  Dx 250.02  . Lancets (ONETOUCH ULTRASOFT) lancets Patient test 1X per day and prn  Dx 250.02  . lisinopril (ZESTRIL) 2.5 MG tablet Take 1 tablet (2.5 mg total) by mouth daily.  . metFORMIN (GLUCOPHAGE) 1000 MG tablet Take 1 tablet (1,000 mg total) by mouth 2 (two) times daily with a meal.  . metoprolol succinate (TOPROL-XL) 50 MG 24 hr tablet Take 1 tablet (50 mg total) by mouth daily.  . Omega-3 Fatty Acids (FISH OIL) 1000 MG CAPS Take 1 capsule by mouth daily.   Marland Kitchen omeprazole (PRILOSEC) 40 MG capsule Take 1 capsule (40 mg total) by mouth daily.  . vitamin C (ASCORBIC ACID) 500 MG tablet Take 500 mg by mouth daily.     No facility-administered encounter medications on file as of 07/08/2019.    Past Surgical History:  Procedure Laterality Date  . APPENDECTOMY    . CARDIAC CATHETERIZATION  2005  . CORONARY ARTERY BYPASS GRAFT  2000   3 vessel  . HERNIA REPAIR  1974  . PROSTATE BIOPSY  06/2017   x 4-5     Family History  Problem Relation Age of  Onset  . Lung cancer Mother   . Cancer Mother   . Dementia Father   . Hyperlipidemia Father   . Congestive Heart Failure Father   . Post-traumatic stress disorder Son   . Alcohol abuse Maternal Grandfather   . Cancer Maternal Grandfather 90       stomach cancer   . Heart attack Neg Hx   . Stroke Neg Hx     New complaints: None today  Social history: Lives with his wife  Controlled substance contract: n/a    Review of Systems  Constitutional: Negative for diaphoresis.  Eyes: Negative for pain.  Respiratory: Negative for shortness of breath.   Cardiovascular: Negative for chest pain, palpitations and leg swelling.  Gastrointestinal: Negative for abdominal pain.  Endocrine: Negative for polydipsia.  Skin: Negative for rash.  Neurological: Negative  for dizziness, weakness and headaches.  Hematological: Does not bruise/bleed easily.  All other systems reviewed and are negative.      Objective:   Physical Exam Vitals and nursing note reviewed.  Constitutional:      Appearance: Normal appearance. He is well-developed.  HENT:     Head: Normocephalic.     Nose: Nose normal.  Eyes:     Pupils: Pupils are equal, round, and reactive to light.  Neck:     Thyroid: No thyroid mass or thyromegaly.     Vascular: No carotid bruit or JVD.     Trachea: Phonation normal.  Cardiovascular:     Rate and Rhythm: Normal rate and regular rhythm.  Pulmonary:     Effort: Pulmonary effort is normal. No respiratory distress.     Breath sounds: Normal breath sounds.  Abdominal:     General: Bowel sounds are normal.     Palpations: Abdomen is soft.     Tenderness: There is no abdominal tenderness.  Musculoskeletal:        General: Normal range of motion.     Cervical back: Normal range of motion and neck supple.  Lymphadenopathy:     Cervical: No cervical adenopathy.  Skin:    General: Skin is warm and dry.  Neurological:     Mental Status: He is alert and oriented to person, place, and time.  Psychiatric:        Behavior: Behavior normal.        Thought Content: Thought content normal.        Judgment: Judgment normal.    BP 120/68   Pulse 65   Temp 97.8 F (36.6 C) (Temporal)   Resp 20   Ht 5\' 11"  (1.803 m)   Wt 225 lb (102.1 kg)   SpO2 95%   BMI 31.38 kg/m   Hgba1c 6.0%      Assessment & Plan:  GRAFTON WARZECHA comes in today with chief complaint of Medical Management of Chronic Issues   Diagnosis and orders addressed:  1. Essential hypertension Low sodium diet - metoprolol succinate (TOPROL-XL) 50 MG 24 hr tablet; Take 1 tablet (50 mg total) by mouth daily.  Dispense: 90 tablet; Refill: 1 - lisinopril (ZESTRIL) 2.5 MG tablet; Take 1 tablet (2.5 mg total) by mouth daily.  Dispense: 90 tablet; Refill: 1  2. Coronary  atherosclerosis of autologous vein bypass graft without angina Keep follo wup with cardiology  3. Mixed hyperlipidemia Low fat diet - atorvastatin (LIPITOR) 40 MG tablet; Take 1 tablet (40 mg total) by mouth daily.  Dispense: 90 tablet; Refill: 1  4. Type 2 diabetes mellitus without complication, without  long-term current use of insulin (HCC) Continue to watch carbs in diet - glipiZIDE (GLUCOTROL XL) 10 MG 24 hr tablet; Take 1 tablet (10 mg total) by mouth daily.  Dispense: 90 tablet; Refill: 1 - metFORMIN (GLUCOPHAGE) 1000 MG tablet; Take 1 tablet (1,000 mg total) by mouth 2 (two) times daily with a meal.  Dispense: 180 tablet; Refill: 1  5. Gastroesophageal reflux disease without esophagitis Avoid spicy foods Do not eat 2 hours prior to bedtime - omeprazole (PRILOSEC) 40 MG capsule; Take 1 capsule (40 mg total) by mouth daily.  Dispense: 90 capsule; Refill: 1  6. CKD stage 3 due to type 2 diabetes mellitus ([place]) Labs pending  7. BMI 35.0-35.9,adult Discussed diet and exercise for person with BMI >25 Will recheck weight in 3-6 months    Labs pending Health Maintenance reviewed Diet and exercise encouraged  Follow up plan: 3 months   [name], FNP

## 2019-07-08 NOTE — Addendum Note (Signed)
Addended by: Rolena Infante on: 07/08/2019 02:13 PM   Modules accepted: Orders

## 2019-07-09 LAB — CBC WITH DIFFERENTIAL/PLATELET
Basophils Absolute: 0.1 10*3/uL (ref 0.0–0.2)
Basos: 1 %
EOS (ABSOLUTE): 0.3 10*3/uL (ref 0.0–0.4)
Eos: 5 %
Hematocrit: 42.9 % (ref 37.5–51.0)
Hemoglobin: 14.5 g/dL (ref 13.0–17.7)
Immature Grans (Abs): 0 10*3/uL (ref 0.0–0.1)
Immature Granulocytes: 0 %
Lymphocytes Absolute: 1.4 10*3/uL (ref 0.7–3.1)
Lymphs: 22 %
MCH: 28.9 pg (ref 26.6–33.0)
MCHC: 33.8 g/dL (ref 31.5–35.7)
MCV: 86 fL (ref 79–97)
Monocytes Absolute: 0.5 10*3/uL (ref 0.1–0.9)
Monocytes: 8 %
Neutrophils Absolute: 4 10*3/uL (ref 1.4–7.0)
Neutrophils: 64 %
Platelets: 178 10*3/uL (ref 150–450)
RBC: 5.02 x10E6/uL (ref 4.14–5.80)
RDW: 14.5 % (ref 11.6–15.4)
WBC: 6.2 10*3/uL (ref 3.4–10.8)

## 2019-07-09 LAB — CMP14+EGFR
ALT: 12 IU/L (ref 0–44)
AST: 12 IU/L (ref 0–40)
Albumin/Globulin Ratio: 1.6 (ref 1.2–2.2)
Albumin: 4.4 g/dL (ref 3.7–4.7)
Alkaline Phosphatase: 100 IU/L (ref 48–121)
BUN/Creatinine Ratio: 15 (ref 10–24)
BUN: 23 mg/dL (ref 8–27)
Bilirubin Total: 0.4 mg/dL (ref 0.0–1.2)
CO2: 24 mmol/L (ref 20–29)
Calcium: 9.7 mg/dL (ref 8.6–10.2)
Chloride: 105 mmol/L (ref 96–106)
Creatinine, Ser: 1.55 mg/dL — ABNORMAL HIGH (ref 0.76–1.27)
GFR calc Af Amer: 51 mL/min/{1.73_m2} — ABNORMAL LOW (ref >59)
GFR calc non Af Amer: 44 mL/min/{1.73_m2} — ABNORMAL LOW (ref >59)
Globulin, Total: 2.7 g/dL (ref 1.5–4.5)
Glucose: 87 mg/dL (ref 65–99)
Potassium: 5.5 mmol/L — ABNORMAL HIGH (ref 3.5–5.2)
Sodium: 143 mmol/L (ref 134–144)
Total Protein: 7.1 g/dL (ref 6.0–8.5)

## 2019-07-09 LAB — LIPID PANEL
Chol/HDL Ratio: 3.9 ratio (ref 0.0–5.0)
Cholesterol, Total: 116 mg/dL (ref 100–199)
HDL: 30 mg/dL — ABNORMAL LOW (ref >39)
LDL Chol Calc (NIH): 69 mg/dL (ref 0–99)
Triglycerides: 89 mg/dL (ref 0–149)
VLDL Cholesterol Cal: 17 mg/dL (ref 5–40)

## 2019-07-26 ENCOUNTER — Ambulatory Visit (INDEPENDENT_AMBULATORY_CARE_PROVIDER_SITE_OTHER): Payer: Medicare PPO | Admitting: *Deleted

## 2019-07-26 ENCOUNTER — Other Ambulatory Visit: Payer: Self-pay

## 2019-07-26 DIAGNOSIS — Z23 Encounter for immunization: Secondary | ICD-10-CM

## 2019-07-26 NOTE — Progress Notes (Signed)
Shingrix vaccine given patient tolerated well.

## 2019-08-20 ENCOUNTER — Other Ambulatory Visit: Payer: Self-pay | Admitting: Nurse Practitioner

## 2019-08-20 DIAGNOSIS — E782 Mixed hyperlipidemia: Secondary | ICD-10-CM

## 2019-08-24 ENCOUNTER — Other Ambulatory Visit: Payer: Self-pay

## 2019-08-24 LAB — HM DIABETES EYE EXAM

## 2019-09-01 ENCOUNTER — Other Ambulatory Visit: Payer: Self-pay

## 2019-09-01 DIAGNOSIS — R972 Elevated prostate specific antigen [PSA]: Secondary | ICD-10-CM

## 2019-09-01 DIAGNOSIS — C61 Malignant neoplasm of prostate: Secondary | ICD-10-CM

## 2019-09-04 ENCOUNTER — Other Ambulatory Visit: Payer: Self-pay | Admitting: Nurse Practitioner

## 2019-09-04 DIAGNOSIS — K219 Gastro-esophageal reflux disease without esophagitis: Secondary | ICD-10-CM

## 2019-09-23 ENCOUNTER — Other Ambulatory Visit: Payer: Self-pay | Admitting: Urology

## 2019-09-23 ENCOUNTER — Other Ambulatory Visit: Payer: Medicare PPO

## 2019-09-23 DIAGNOSIS — C61 Malignant neoplasm of prostate: Secondary | ICD-10-CM | POA: Diagnosis not present

## 2019-09-24 LAB — PSA: Prostate Specific Ag, Serum: 13.3 ng/mL — ABNORMAL HIGH (ref 0.0–4.0)

## 2019-09-27 ENCOUNTER — Other Ambulatory Visit: Payer: Medicare PPO

## 2019-09-29 NOTE — Progress Notes (Signed)
Subjective:  1. Cancer of prostate (Jacob Rios)   2. Elevated PSA   3. Nodular prostate with lower urinary tract symptoms   4. Nocturia      Jacob Rios returns today in f/u for his history of low risk prostate cancer on surveillance. His PSA was up to 12.9 in March 2020 but was back down to 10.4 at his last visit but it is now back up to 13.3.  He had a UTI and went to the ER in late July 2020 and was given antibiotics but had Mx species on the culture.    He has not had any further UTI's.  He passed a stone last year.  His IPSS is10.  His UA today is clear.   He had an MRI fusion biopsy in 3/19 for surveillance of his low risk prostate cancer. He had a single core of 20% of gleason 6 disease in the left lateral apex. he had atypia in one core. His PSA was 8.5 in 9/19 and 9.0 in 1/19. He has no associated signs or symptoms.   He was found to have a T2a Nx Mx Gleason 6 prostate cancer with 5% in one left apical and one right apical core in 07/26/13. A repeat biopsy in 8/16 had a single core of Gleason 6 in the left mid lateral gland with 10% involvement. His PSA prior to this visit is up to 8.4 from 6.6 in 5/18. It had been stable for the last 2 years. His Prostate volume was 43m initally and 359mon the repeat. He has a history of a 1046might base prostate nodule and BOO. He has no associated signs or symptoms.       ROS:  ROS:  A complete review of systems was performed.  All systems are negative except for pertinent findings as noted.   Review of Systems    Allergies  Allergen Reactions  . Morphine Swelling and Other (See Comments)    PT DOESNT REMEMBER /     Outpatient Encounter Medications as of 09/30/2019  Medication Sig  . aspirin 81 MG tablet Take 81 mg by mouth daily.  . aMarland Kitchenorvastatin (LIPITOR) 40 MG tablet Take 1 tablet by mouth once daily  . glipiZIDE (GLUCOTROL XL) 10 MG 24 hr tablet Take 1 tablet (10 mg total) by mouth daily.  . gMarland Kitchenucose blood (ONETOUCH VERIO) test strip Test  1X per day and as needed  Dx 250.02  . Lancets (ONETOUCH ULTRASOFT) lancets Patient test 1X per day and prn  Dx 250.02  . lisinopril (ZESTRIL) 2.5 MG tablet Take 1 tablet (2.5 mg total) by mouth daily.  . metFORMIN (GLUCOPHAGE) 1000 MG tablet Take 1 tablet (1,000 mg total) by mouth 2 (two) times daily with a meal.  . metoprolol succinate (TOPROL-XL) 50 MG 24 hr tablet Take 1 tablet (50 mg total) by mouth daily.  . Omega-3 Fatty Acids (FISH OIL) 1000 MG CAPS Take 1 capsule by mouth daily.   . oMarland Kitcheneprazole (PRILOSEC) 40 MG capsule Take 1 capsule by mouth once daily  . vitamin C (ASCORBIC ACID) 500 MG tablet Take 500 mg by mouth daily.    . lMarland Kitchenvofloxacin (LEVAQUIN) 750 MG tablet Take 1 tablet (750 mg total) by mouth daily. Take one hour prior to biopsy.   No facility-administered encounter medications on file as of 09/30/2019.    Past Medical History:  Diagnosis Date  [facility]  . Coronary artery disease    3V CABG  2000. Last heart cath 2005, with 2 patent grafts  . Diabetes mellitus   . Hyperlipidemia   . Hypertension     Past Surgical History:  Procedure Laterality Date  . APPENDECTOMY    . CARDIAC CATHETERIZATION  2005  . CORONARY ARTERY BYPASS GRAFT  2000   3 vessel  . HERNIA REPAIR  1974  . PROSTATE BIOPSY  06/2017   x 4-5     Social History   Socioeconomic History  . Marital status: Married    Spouse name: Jacob Rios   . Number of children: 3  . Years of education: Not on file  . Highest education level: Not on file  Occupational History  . Occupation: part-time Pharmacist, community    Comment: retired   Tobacco Use  . Smoking status: Former Smoker    Quit date: 01/13/1997    Years since quitting: 22.7  . Smokeless tobacco: Never Used  Vaping Use  . Vaping Use: Never used  Substance and Sexual Activity  . Alcohol use: No  . Drug use: No  . Sexual activity: Not on file  Other Topics Concern  . Not on file  Social History Narrative  . Not on file    Social Determinants of Health   Financial Resource Strain:   . Difficulty of Paying Living Expenses: Not on file  Food Insecurity:   . Worried About Charity fundraiser in the Last Year: Not on file  . Ran Out of Food in the Last Year: Not on file  Transportation Needs:   . Lack of Transportation (Medical): Not on file  . Lack of Transportation (Non-Medical): Not on file  Physical Activity:   . Days of Exercise per Week: Not on file  . Minutes of Exercise per Session: Not on file  Stress:   . Feeling of Stress : Not on file  Social Connections:   . Frequency of Communication with Friends and Family: Not on file  . Frequency of Social Gatherings with Friends and Family: Not on file  . Attends Religious Services: Not on file  . Active Member of Clubs or Organizations: Not on file  . Attends Archivist Meetings: Not on file  . Marital Status: Not on file  Intimate Partner Violence:   . Fear of Current or Ex-Partner: Not on file  . Emotionally Abused: Not on file  . Physically Abused: Not on file  . Sexually Abused: Not on file    Family History  Problem Relation Age of Onset  . Lung cancer Mother   . Cancer Mother   . Dementia Father   . Hyperlipidemia Father   . Congestive Heart Failure Father   . Post-traumatic stress disorder Son   . Alcohol abuse Maternal Grandfather   . Cancer Maternal Grandfather 90       stomach cancer   . Heart attack Neg Hx   . Stroke Neg Hx        Objective: Vitals:   09/30/19 1019  BP: 96/67  Pulse: 73  Temp: 98.1 F (36.7 C)     Physical Exam  Lab Results:  Results for orders placed or performed in visit on 09/30/19 (from the past 24 hour(s))  Urinalysis, Routine w reflex microscopic     Status: Abnormal   Collection Time: 09/30/19 10:20 AM  Result Value Ref Range   Specific Gravity, UA 1.020 1.005 - 1.030   pH, UA 5.5 5.0 - 7.5   Color, UA Yellow Yellow   Appearance  Ur Clear Clear   Leukocytes,UA Negative  Negative   Protein,UA Trace (A) Negative/Trace   Glucose, UA Negative Negative   Ketones, UA Trace (A) Negative   RBC, UA Negative Negative   Bilirubin, UA Negative Negative   Urobilinogen, Ur 1.0 0.2 - 1.0 mg/dL   Nitrite, UA Negative Negative   Microscopic Examination Comment    Narrative   Performed at:  [facility] [address], [place], [place]  [id number] Lab Director: [place], Phone:  [phone]    BMET No results for input(s): NA, K, CL, CO2, GLUCOSE, BUN, CREATININE, CALCIUM in the last 72 hours. PSA PSA  Date Value Ref Range Status  03/30/2019 10.4 (H) < OR = 4.0 ng/mL Final    Comment:    The total PSA value from this assay system is  standardized against the WHO standard. The test  result will be approximately 20% lower when compared  to the equimolar-standardized total PSA (Beckman  Coulter). Comparison of serial PSA results should be  interpreted with this fact in mind. . This test was performed using the Siemens  chemiluminescent method. Values obtained from  different assay methods cannot be used interchangeably. PSA levels, regardless of value, should not be interpreted as absolute evidence of the presence or absence of disease.   11/18/2013 5.0 (H) 0.0 - 4.0 ng/mL Final    Comment:    Roche ECLIA methodology. According to the American Urological Association, Serum PSA should decrease and remain at undetectable levels after radical prostatectomy. The AUA defines biochemical recurrence as an initial PSA value 0.2 ng/mL or greater followed by a subsequent confirmatory PSA value 0.2 ng/mL or greater. Values obtained with different assay methods or kits cannot be used interchangeably. Results cannot be interpreted as absolute evidence of the presence or absence of malignant disease.   05/20/2013 5.3 (H) 0.0 - 4.0 ng/mL Final    Comment:    Roche ECLIA methodology. According to the American Urological Association, Serum PSA  should decrease and remain at undetectable levels after radical prostatectomy. The AUA defines biochemical recurrence as an initial PSA value 0.2 ng/mL or greater followed by a subsequent confirmatory PSA value 0.2 ng/mL or greater. Values obtained with different assay methods or kits cannot be used interchangeably. Results cannot be interpreted as absolute evidence of the presence or absence of malignant disease.   No results found for: TESTOSTERONE    Studies/Results: Recent Results (from the past 2160 hour(s))  Bayer DCA Hb A1c Waived     Status: None   Collection Time: 07/08/19  2:15 PM  Result Value Ref Range   HB A1C (BAYER DCA - WAIVED) 6.0 <7.0 %    Comment:                                       Diabetic Adult            <7.0                                       Healthy Adult        4.3 - 5.7                                                           (  DCCT/NGSP) American Diabetes Association's Summary of Glycemic Recommendations for Adults with Diabetes: Hemoglobin A1c <7.0%. More stringent glycemic goals (A1c <6.0%) may further reduce complications at the cost of increased risk of hypoglycemia.   CBC with Differential/Platelet     Status: None   Collection Time: 07/08/19  2:15 PM  Result Value Ref Range   WBC 6.2 3.4 - 10.8 x10E3/uL   RBC 5.02 4.14 - 5.80 x10E6/uL   Hemoglobin 14.5 13.0 - 17.7 g/dL   Hematocrit 42.9 37.5 - 51.0 %   MCV 86 79 - 97 fL   MCH 28.9 26.6 - 33.0 pg   MCHC 33.8 31 - 35 g/dL   RDW 14.5 11.6 - 15.4 %   Platelets 178 150 - 450 x10E3/uL   Neutrophils 64 Not Estab. %   Lymphs 22 Not Estab. %   Monocytes 8 Not Estab. %   Eos 5 Not Estab. %   Basos 1 Not Estab. %   Neutrophils Absolute 4.0 1 - 7 x10E3/uL   Lymphocytes Absolute 1.4 0 - 3 x10E3/uL   Monocytes Absolute 0.5 0 - 0 x10E3/uL   EOS (ABSOLUTE) 0.3 0.0 - 0.4 x10E3/uL   Basophils Absolute 0.1 0 - 0 x10E3/uL   Immature Granulocytes 0 Not Estab. %   Immature Grans (Abs) 0.0 0.0 - 0.1  x10E3/uL  CMP14+EGFR     Status: Abnormal   Collection Time: 07/08/19  2:15 PM  Result Value Ref Range   Glucose 87 65 - 99 mg/dL   BUN 23 8 - 27 mg/dL   Creatinine, Ser 1.55 (H) 0.76 - 1.27 mg/dL   GFR calc non Af Amer 44 (L) >59 mL/min/1.73   GFR calc Af Amer 51 (L) >59 mL/min/1.73    Comment: **[facility] currently reports eGFR in compliance with the current**   recommendations of the [employer]. [facility] will   update reporting as new guidelines are published from the NKF-ASN   Task force.    BUN/Creatinine Ratio 15 10 - 24   Sodium 143 134 - 144 mmol/L   Potassium 5.5 (H) 3.5 - 5.2 mmol/L   Chloride 105 96 - 106 mmol/L   CO2 24 20 - 29 mmol/L   Calcium 9.7 8.6 - 10.2 mg/dL   Total Protein 7.1 6.0 - 8.5 g/dL   Albumin 4.4 3.7 - 4.7 g/dL   Globulin, Total 2.7 1.5 - 4.5 g/dL   Albumin/Globulin Ratio 1.6 1.2 - 2.2   Bilirubin Total 0.4 0.0 - 1.2 mg/dL   Alkaline Phosphatase 100 48 - 121 IU/L   AST 12 0 - 40 IU/L   ALT 12 0 - 44 IU/L  Lipid panel     Status: Abnormal   Collection Time: 07/08/19  2:15 PM  Result Value Ref Range   Cholesterol, Total 116 100 - 199 mg/dL   Triglycerides 89 0 - 149 mg/dL   HDL 30 (L) >39 mg/dL   VLDL Cholesterol Cal 17 5 - 40 mg/dL   LDL Chol Calc (NIH) 69 0 - 99 mg/dL   Chol/HDL Ratio 3.9 0.0 - 5.0 ratio    Comment:                                   T. Chol/HDL Ratio  Men  Women                               1/2 Avg.Risk  3.4    3.3                                   Avg.Risk  5.0    4.4                                2X Avg.Risk  9.6    7.1                                3X Avg.Risk 23.4   11.0   HM DIABETES EYE EXAM     Status: None   Collection Time: 08/24/19 12:00 AM  Result Value Ref Range   HM Diabetic Eye Exam No Retinopathy No Retinopathy    Comment: Wyatt Portela, MD  PSA     Status: Abnormal   Collection Time: 09/23/19 10:07 AM  Result Value Ref Range   Prostate  Specific Ag, Serum 13.3 (H) 0.0 - 4.0 ng/mL    Comment: Roche ECLIA methodology. According to the American Urological Association, Serum PSA should decrease and remain at undetectable levels after radical prostatectomy. The AUA defines biochemical recurrence as an initial PSA value 0.2 ng/mL or greater followed by a subsequent confirmatory PSA value 0.2 ng/mL or greater. Values obtained with different assay methods or kits cannot be used interchangeably. Results cannot be interpreted as absolute evidence of the presence or absence of malignant disease.   Urinalysis, Routine w reflex microscopic     Status: Abnormal   Collection Time: 09/30/19 10:20 AM  Result Value Ref Range   Specific Gravity, UA 1.020 1.005 - 1.030   pH, UA 5.5 5.0 - 7.5   Color, UA Yellow Yellow   Appearance Ur Clear Clear   Leukocytes,UA Negative Negative   Protein,UA Trace (A) Negative/Trace   Glucose, UA Negative Negative   Ketones, UA Trace (A) Negative   RBC, UA Negative Negative   Bilirubin, UA Negative Negative   Urobilinogen, Ur 1.0 0.2 - 1.0 mg/dL   Nitrite, UA Negative Negative   Microscopic Examination Comment     Comment: Microscopic follows if indicated.       Assessment & Plan: Prostate cancer.  His PSA is back up but below the prior high.  He will return in 6 months with a PSA.  Prostate Nodule with obstruction.  His LUTS are reasonably stable and the nodule is unchanged.   Meds ordered this encounter  Medications  . levofloxacin (LEVAQUIN) 750 MG tablet    Sig: Take 1 tablet (750 mg total) by mouth daily. Take one hour prior to biopsy.    Dispense:  1 tablet    Refill:  0     Orders Placed This Encounter  Procedures  . [place] PROSTATE BIOPSY MULTIPLE    Standing Status:   Future    Standing Expiration Date:   11/30/2019    Order Specific Question:   Reason for Exam (SYMPTOM  OR DIAGNOSIS REQUIRED)    Answer:   prostate cancer surveillance biopsy    Order Specific Question:    Preferred location?    Answer:   Deneise Lever  [facility]  . Urinalysis, Routine w reflex microscopic      No follow-ups on file.   CC: [name], FNP      [name] 09/30/2019

## 2019-09-30 ENCOUNTER — Other Ambulatory Visit: Payer: Self-pay

## 2019-09-30 ENCOUNTER — Encounter: Payer: Self-pay | Admitting: Urology

## 2019-09-30 ENCOUNTER — Ambulatory Visit (INDEPENDENT_AMBULATORY_CARE_PROVIDER_SITE_OTHER): Payer: Medicare PPO | Admitting: Urology

## 2019-09-30 VITALS — BP 96/67 | HR 73 | Temp 98.1°F | Ht 71.0 in | Wt 225.0 lb

## 2019-09-30 DIAGNOSIS — C61 Malignant neoplasm of prostate: Secondary | ICD-10-CM

## 2019-09-30 DIAGNOSIS — R972 Elevated prostate specific antigen [PSA]: Secondary | ICD-10-CM

## 2019-09-30 DIAGNOSIS — N403 Nodular prostate with lower urinary tract symptoms: Secondary | ICD-10-CM | POA: Diagnosis not present

## 2019-09-30 DIAGNOSIS — R351 Nocturia: Secondary | ICD-10-CM

## 2019-09-30 LAB — URINALYSIS, ROUTINE W REFLEX MICROSCOPIC
Bilirubin, UA: NEGATIVE
Glucose, UA: NEGATIVE
Leukocytes,UA: NEGATIVE
Nitrite, UA: NEGATIVE
RBC, UA: NEGATIVE
Specific Gravity, UA: 1.02 (ref 1.005–1.030)
Urobilinogen, Ur: 1 mg/dL (ref 0.2–1.0)
pH, UA: 5.5 (ref 5.0–7.5)

## 2019-09-30 MED ORDER — LEVOFLOXACIN 750 MG PO TABS
750.0000 mg | ORAL_TABLET | Freq: Every day | ORAL | 0 refills | Status: DC
Start: 2019-09-30 — End: 2019-10-17

## 2019-09-30 NOTE — Progress Notes (Signed)
Urological Symptom Review  Patient is experiencing the following symptoms: Get up at night to urinate Stream starts and stops Erection problems (male only)   Review of Systems  Gastrointestinal (upper)  : Negative for upper GI symptoms  Gastrointestinal (lower) : Negative for lower GI symptoms  Constitutional : Negative for symptoms  Skin: Negative for skin symptoms  Eyes: Negative for eye symptoms  Ear/Nose/Throat : Negative for Ear/Nose/Throat symptoms  Hematologic/Lymphatic: Negative for Hematologic/Lymphatic symptoms  Cardiovascular : Negative for cardiovascular symptoms  Respiratory : Negative for respiratory symptoms  Endocrine: Negative for endocrine symptoms  Musculoskeletal: Negative for musculoskeletal symptoms  Neurological: Negative for neurological symptoms  Psychologic: Negative for psychiatric symptoms  

## 2019-09-30 NOTE — Patient Instructions (Addendum)
   Appointment Time:11/11/2019 Appointment Date: 08:30  Location: [facility]   Prostate Biopsy Instructions  Stop all aspirin or blood thinners (aspirin, plavix, coumadin, warfarin, motrin, ibuprofen, advil, aleve, naproxen, naprosyn) for 7 days prior to the procedure.  If you have any questions about stopping these medications, please contact your primary care physician or cardiologist.  Having a light meal prior to the procedure is recommended.  If you are diabetic or have low blood sugar please bring a small snack or glucose tablet.  A Fleets enema is needed to be purchased over the counter at a local pharmacy and used 2 hours before you scheduled appointment.  This can be purchased over the counter at any pharmacy.  Antibiotics will be administered in the clinic at the time of the procedure and 1 tablet has been sent to your pharmacy. Please take the antibiotic as prescribed.    Please bring someone with you to the procedure to drive you home.   If you have any questions or concerns, please feel free to call the office at [phone] or send a Mychart message.    Thank you, [facility]

## 2019-10-07 ENCOUNTER — Ambulatory Visit: Payer: Medicare PPO | Admitting: Urology

## 2019-10-17 ENCOUNTER — Ambulatory Visit (INDEPENDENT_AMBULATORY_CARE_PROVIDER_SITE_OTHER): Payer: Medicare PPO | Admitting: Nurse Practitioner

## 2019-10-17 ENCOUNTER — Encounter: Payer: Self-pay | Admitting: Nurse Practitioner

## 2019-10-17 ENCOUNTER — Other Ambulatory Visit: Payer: Self-pay

## 2019-10-17 VITALS — BP 130/75 | HR 66 | Temp 97.5°F | Resp 20 | Ht 71.0 in | Wt 229.0 lb

## 2019-10-17 DIAGNOSIS — E782 Mixed hyperlipidemia: Secondary | ICD-10-CM

## 2019-10-17 DIAGNOSIS — K219 Gastro-esophageal reflux disease without esophagitis: Secondary | ICD-10-CM | POA: Diagnosis not present

## 2019-10-17 DIAGNOSIS — E119 Type 2 diabetes mellitus without complications: Secondary | ICD-10-CM | POA: Diagnosis not present

## 2019-10-17 DIAGNOSIS — E1122 Type 2 diabetes mellitus with diabetic chronic kidney disease: Secondary | ICD-10-CM

## 2019-10-17 DIAGNOSIS — N183 Chronic kidney disease, stage 3 unspecified: Secondary | ICD-10-CM | POA: Diagnosis not present

## 2019-10-17 DIAGNOSIS — Z6835 Body mass index (BMI) 35.0-35.9, adult: Secondary | ICD-10-CM | POA: Diagnosis not present

## 2019-10-17 DIAGNOSIS — I10 Essential (primary) hypertension: Secondary | ICD-10-CM

## 2019-10-17 LAB — CBC WITH DIFFERENTIAL/PLATELET
Basophils Absolute: 0.1 10*3/uL (ref 0.0–0.2)
Basos: 1 %
EOS (ABSOLUTE): 0.3 10*3/uL (ref 0.0–0.4)
Eos: 5 %
Hematocrit: 38.2 % (ref 37.5–51.0)
Hemoglobin: 13.4 g/dL (ref 13.0–17.7)
Immature Grans (Abs): 0 10*3/uL (ref 0.0–0.1)
Immature Granulocytes: 0 %
Lymphocytes Absolute: 1.3 10*3/uL (ref 0.7–3.1)
Lymphs: 22 %
MCH: 30.8 pg (ref 26.6–33.0)
MCHC: 35.1 g/dL (ref 31.5–35.7)
MCV: 88 fL (ref 79–97)
Monocytes Absolute: 0.5 10*3/uL (ref 0.1–0.9)
Monocytes: 8 %
Neutrophils Absolute: 3.9 10*3/uL (ref 1.4–7.0)
Neutrophils: 64 %
Platelets: 165 10*3/uL (ref 150–450)
RBC: 4.35 x10E6/uL (ref 4.14–5.80)
RDW: 13.9 % (ref 11.6–15.4)
WBC: 6 10*3/uL (ref 3.4–10.8)

## 2019-10-17 LAB — CMP14+EGFR
ALT: 15 IU/L (ref 0–44)
AST: 13 IU/L (ref 0–40)
Albumin/Globulin Ratio: 1.6 (ref 1.2–2.2)
Albumin: 4.1 g/dL (ref 3.7–4.7)
Alkaline Phosphatase: 90 IU/L (ref 44–121)
BUN/Creatinine Ratio: 10 (ref 10–24)
BUN: 14 mg/dL (ref 8–27)
Bilirubin Total: 0.4 mg/dL (ref 0.0–1.2)
CO2: 22 mmol/L (ref 20–29)
Calcium: 9.3 mg/dL (ref 8.6–10.2)
Chloride: 105 mmol/L (ref 96–106)
Creatinine, Ser: 1.38 mg/dL — ABNORMAL HIGH (ref 0.76–1.27)
GFR calc Af Amer: 58 mL/min/{1.73_m2} — ABNORMAL LOW (ref >59)
GFR calc non Af Amer: 50 mL/min/{1.73_m2} — ABNORMAL LOW (ref >59)
Globulin, Total: 2.6 g/dL (ref 1.5–4.5)
Glucose: 96 mg/dL (ref 65–99)
Potassium: 4.8 mmol/L (ref 3.5–5.2)
Sodium: 140 mmol/L (ref 134–144)
Total Protein: 6.7 g/dL (ref 6.0–8.5)

## 2019-10-17 LAB — LIPID PANEL
Chol/HDL Ratio: 3.3 ratio (ref 0.0–5.0)
Cholesterol, Total: 103 mg/dL (ref 100–199)
HDL: 31 mg/dL — ABNORMAL LOW (ref >39)
LDL Chol Calc (NIH): 55 mg/dL (ref 0–99)
Triglycerides: 86 mg/dL (ref 0–149)
VLDL Cholesterol Cal: 17 mg/dL (ref 5–40)

## 2019-10-17 LAB — BAYER DCA HB A1C WAIVED: HB A1C (BAYER DCA - WAIVED): 5.9 % (ref <7.0)

## 2019-10-17 MED ORDER — METFORMIN HCL 1000 MG PO TABS: 1000.0000 mg | ORAL_TABLET | Freq: Two times a day (BID) | ORAL | 1 refills | Status: DC

## 2019-10-17 MED ORDER — GLIPIZIDE ER 10 MG PO TB24: 10.0000 mg | ORAL_TABLET | Freq: Every day | ORAL | 1 refills | Status: DC

## 2019-10-17 MED ORDER — OMEPRAZOLE 40 MG PO CPDR: 40.0000 mg | DELAYED_RELEASE_CAPSULE | Freq: Every day | ORAL | 1 refills | Status: DC

## 2019-10-17 MED ORDER — ATORVASTATIN CALCIUM 40 MG PO TABS: 40.0000 mg | ORAL_TABLET | Freq: Every day | ORAL | 1 refills | Status: DC

## 2019-10-17 MED ORDER — LISINOPRIL 2.5 MG PO TABS: 2.5000 mg | ORAL_TABLET | Freq: Every day | ORAL | 1 refills | Status: DC

## 2019-10-17 MED ORDER — METOPROLOL SUCCINATE ER 50 MG PO TB24: 50.0000 mg | ORAL_TABLET | Freq: Every day | ORAL | 1 refills | Status: DC

## 2019-10-17 NOTE — Progress Notes (Signed)
Subjective:    Patient ID: Jacob Rios, male    DOB: [birth year], 74 y.o.   MRN: [id number]   Chief Complaint: Medical Management of Chronic Issues    HPI:  1. Essential hypertension No c/o chest pain, sob or headache. Does not check blood pressure at home. BP Readings from Last 3 Encounters:  10/17/19 130/75  09/30/19 96/67  07/08/19 120/68     2. Mixed hyperlipidemia Does not watch diet and does no exercise. Lab Results  Component Value Date   CHOL 116 07/08/2019   HDL 30 (L) 07/08/2019   LDLCALC 69 07/08/2019   TRIG 89 07/08/2019   CHOLHDL 3.9 07/08/2019     3. Type 2 diabetes mellitus without complication, without long-term current use of insulin (HCC) Fasting blood sugars are running around 100-120. He denies any low blood sugars.  4. Gastroesophageal reflux disease without esophagitis Is on omerpazole daily and is doing well.  5. CKD stage 3 due to type 2 diabetes mellitus ([place]) Denies any problem voiding Lab Results  Component Value Date   CREATININE 1.55 (H) 07/08/2019     6. BMI 35.0-35.9,adult No recent weight changes Wt Readings from Last 3 Encounters:  10/17/19 229 lb (103.9 kg)  09/30/19 225 lb (102.1 kg)  07/08/19 225 lb (102.1 kg)   BMI Readings from Last 3 Encounters:  10/17/19 31.94 kg/m  09/30/19 31.38 kg/m  07/08/19 31.38 kg/m       Outpatient Encounter Medications as of 10/17/2019  Medication Sig  . aspirin 81 MG tablet Take 81 mg by mouth daily.  Marland Kitchen atorvastatin (LIPITOR) 40 MG tablet Take 1 tablet by mouth once daily  . glipiZIDE (GLUCOTROL XL) 10 MG 24 hr tablet Take 1 tablet (10 mg total) by mouth daily.  Marland Kitchen glucose blood (ONETOUCH VERIO) test strip Test 1X per day and as needed  Dx 250.02  . Lancets (ONETOUCH ULTRASOFT) lancets Patient test 1X per day and prn  Dx 250.02  . lisinopril (ZESTRIL) 2.5 MG tablet Take 1 tablet (2.5 mg total) by mouth daily.  . metFORMIN (GLUCOPHAGE) 1000 MG tablet Take 1 tablet (1,000 mg  total) by mouth 2 (two) times daily with a meal.  . metoprolol succinate (TOPROL-XL) 50 MG 24 hr tablet Take 1 tablet (50 mg total) by mouth daily.  . Omega-3 Fatty Acids (FISH OIL) 1000 MG CAPS Take 1 capsule by mouth daily.   Marland Kitchen omeprazole (PRILOSEC) 40 MG capsule Take 1 capsule by mouth once daily  . vitamin C (ASCORBIC ACID) 500 MG tablet Take 500 mg by mouth daily.      Past Surgical History:  Procedure Laterality Date  . APPENDECTOMY    . CARDIAC CATHETERIZATION  2005  . CORONARY ARTERY BYPASS GRAFT  2000   3 vessel  . HERNIA REPAIR  1974  . PROSTATE BIOPSY  06/2017   x 4-5     Family History  Problem Relation Age of Onset  . Lung cancer Mother   . Cancer Mother   . Dementia Father   . Hyperlipidemia Father   . Congestive Heart Failure Father   . Post-traumatic stress disorder Son   . Alcohol abuse Maternal Grandfather   . Cancer Maternal Grandfather 90       stomach cancer   . Heart attack Neg Hx   . Stroke Neg Hx     New complaints: None today  Social history: Lives with his wife  Controlled substance contract: n/a    Review of Systems  Constitutional: Negative for diaphoresis.  Eyes: Negative for pain.  Respiratory: Negative for shortness of breath.   Cardiovascular: Negative for chest pain, palpitations and leg swelling.  Gastrointestinal: Negative for abdominal pain.  Endocrine: Negative for polydipsia.  Skin: Negative for rash.  Neurological: Negative for dizziness, weakness and headaches.  Hematological: Does not bruise/bleed easily.  All other systems reviewed and are negative.      Objective:   Physical Exam Vitals and nursing note reviewed.  Constitutional:      Appearance: Normal appearance. He is well-developed.  HENT:     Head: Normocephalic.     Nose: Nose normal.  Eyes:     Pupils: Pupils are equal, round, and reactive to light.  Neck:     Thyroid: No thyroid mass or thyromegaly.     Vascular: No carotid bruit or JVD.      Trachea: Phonation normal.  Cardiovascular:     Rate and Rhythm: Normal rate and regular rhythm.  Pulmonary:     Effort: Pulmonary effort is normal. No respiratory distress.     Breath sounds: Normal breath sounds.  Abdominal:     General: Bowel sounds are normal.     Palpations: Abdomen is soft.     Tenderness: There is no abdominal tenderness.  Musculoskeletal:        General: Normal range of motion.     Cervical back: Normal range of motion and neck supple.  Lymphadenopathy:     Cervical: No cervical adenopathy.  Skin:    General: Skin is warm and dry.  Neurological:     Mental Status: He is alert and oriented to person, place, and time.  Psychiatric:        Behavior: Behavior normal.        Thought Content: Thought content normal.        Judgment: Judgment normal.    BP 130/75   Pulse 66   Temp (!) 97.5 F (36.4 C) (Temporal)   Resp 20   Ht '5\' 11"'  (1.803 m)   Wt 229 lb (103.9 kg)   SpO2 97%   BMI 31.94 kg/m   HGBA1c .59%     Assessment & Plan:  Jacob Rios comes in today with chief complaint of Medical Management of Chronic Issues   Diagnosis and orders addressed:  1. Essential hypertension Low sodium diet - CBC with Differential/Platelet - CMP14+EGFR - metoprolol succinate (TOPROL-XL) 50 MG 24 hr tablet; Take 1 tablet (50 mg total) by mouth daily.  Dispense: 90 tablet; Refill: 1 - lisinopril (ZESTRIL) 2.5 MG tablet; Take 1 tablet (2.5 mg total) by mouth daily.  Dispense: 90 tablet; Refill: 1  2. Mixed hyperlipidemia Low fat diet - Lipid panel - atorvastatin (LIPITOR) 40 MG tablet; Take 1 tablet (40 mg total) by mouth daily.  Dispense: 90 tablet; Refill: 1  3. Type 2 diabetes mellitus without complication, without long-term current use of insulin (HCC) Continue to watch carbs in deit - Bayer DCA Hb A1c Waived - glipiZIDE (GLUCOTROL XL) 10 MG 24 hr tablet; Take 1 tablet (10 mg total) by mouth daily.  Dispense: 90 tablet; Refill: 1 - metFORMIN  (GLUCOPHAGE) 1000 MG tablet; Take 1 tablet (1,000 mg total) by mouth 2 (two) times daily with a meal.  Dispense: 180 tablet; Refill: 1  4. Gastroesophageal reflux disease without esophagitis Avoid spicy foods Do not eat 2 hours prior to bedtime - omeprazole (PRILOSEC) 40 MG capsule; Take 1 capsule (40 mg total) by mouth daily.  Dispense:  90 capsule; Refill: 1  5. CKD stage 3 due to type 2 diabetes mellitus ([place]) Labs oending  6. BMI 35.0-35.9,adult Discussed diet and exercise for person with BMI >25 Will recheck weight in 3-6 months    Labs pending Health Maintenance reviewed Diet and exercise encouraged  Follow up plan: 3 months   [name], FNP

## 2019-10-17 NOTE — Patient Instructions (Signed)
Diabetes Mellitus and Foot Care Foot care is an important part of your health, especially when you have diabetes. Diabetes may cause you to have problems because of poor blood flow (circulation) to your feet and legs, which can cause your skin to:  Become thinner and drier.  Break more easily.  Heal more slowly.  Peel and crack. You may also have nerve damage (neuropathy) in your legs and feet, causing decreased feeling in them. This means that you may not notice minor injuries to your feet that could lead to more serious problems. Noticing and addressing any potential problems early is the best way to prevent future foot problems. How to care for your feet Foot hygiene  Wash your feet daily with warm water and mild soap. Do not use hot water. Then, pat your feet and the areas between your toes until they are completely dry. Do not soak your feet as this can dry your skin.  Trim your toenails straight across. Do not dig under them or around the cuticle. File the edges of your nails with an emery board or nail file.  Apply a moisturizing lotion or petroleum jelly to the skin on your feet and to dry, brittle toenails. Use lotion that does not contain alcohol and is unscented. Do not apply lotion between your toes. Shoes and socks  Wear clean socks or stockings every day. Make sure they are not too tight. Do not wear knee-high stockings since they may decrease blood flow to your legs.  Wear shoes that fit properly and have enough cushioning. Always look in your shoes before you put them on to be sure there are no objects inside.  To break in new shoes, wear them for just a few hours a day. This prevents injuries on your feet. Wounds, scrapes, corns, and calluses  Check your feet daily for blisters, cuts, bruises, sores, and redness. If you cannot see the bottom of your feet, use a mirror or ask someone for help.  Do not cut corns or calluses or try to remove them with medicine.  If you  find a minor scrape, cut, or break in the skin on your feet, keep it and the skin around it clean and dry. You may clean these areas with mild soap and water. Do not clean the area with peroxide, alcohol, or iodine.  If you have a wound, scrape, corn, or callus on your foot, look at it several times a day to make sure it is healing and not infected. Check for: ? Redness, swelling, or pain. ? Fluid or blood. ? Warmth. ? Pus or a bad smell. General instructions  Do not cross your legs. This may decrease blood flow to your feet.  Do not use heating pads or hot water bottles on your feet. They may burn your skin. If you have lost feeling in your feet or legs, you may not know this is happening until it is too late.  Protect your feet from hot and cold by wearing shoes, such as at the beach or on hot pavement.  Schedule a complete foot exam at least once a year (annually) or more often if you have foot problems. If you have foot problems, report any cuts, sores, or bruises to your health care provider immediately. Contact a health care provider if:  You have a medical condition that increases your risk of infection and you have any cuts, sores, or bruises on your feet.  You have an injury that is not   healing.  You have redness on your legs or feet.  You feel burning or tingling in your legs or feet.  You have pain or cramps in your legs and feet.  Your legs or feet are numb.  Your feet always feel cold.  You have pain around a toenail. Get help right away if:  You have a wound, scrape, corn, or callus on your foot and: ? You have pain, swelling, or redness that gets worse. ? You have fluid or blood coming from the wound, scrape, corn, or callus. ? Your wound, scrape, corn, or callus feels warm to the touch. ? You have pus or a bad smell coming from the wound, scrape, corn, or callus. ? You have a fever. ? You have a red line going up your leg. Summary  Check your feet every day  for cuts, sores, red spots, swelling, and blisters.  Moisturize feet and legs daily.  Wear shoes that fit properly and have enough cushioning.  If you have foot problems, report any cuts, sores, or bruises to your health care provider immediately.  Schedule a complete foot exam at least once a year (annually) or more often if you have foot problems. This information is not intended to replace advice given to you by your health care provider. Make sure you discuss any questions you have with your health care provider. Document Revised: 09/22/2018 Document Reviewed: 02/01/2016 Elsevier Patient Education  2020 Elsevier Inc.  

## 2019-10-26 ENCOUNTER — Other Ambulatory Visit: Payer: Self-pay

## 2019-10-26 ENCOUNTER — Ambulatory Visit (INDEPENDENT_AMBULATORY_CARE_PROVIDER_SITE_OTHER): Payer: Medicare PPO | Admitting: *Deleted

## 2019-10-26 DIAGNOSIS — Z23 Encounter for immunization: Secondary | ICD-10-CM

## 2019-10-26 NOTE — Progress Notes (Signed)
Shingrix given and patient tolerated well.  

## 2019-11-01 ENCOUNTER — Ambulatory Visit (INDEPENDENT_AMBULATORY_CARE_PROVIDER_SITE_OTHER): Payer: Medicare PPO

## 2019-11-01 ENCOUNTER — Other Ambulatory Visit: Payer: Self-pay

## 2019-11-01 DIAGNOSIS — Z23 Encounter for immunization: Secondary | ICD-10-CM | POA: Diagnosis not present

## 2019-11-10 NOTE — Progress Notes (Signed)
  [name] returns today for a prostate biopsy for his history of low risk prostate cancer on surveillance. His PSA was up to 12.9 in March 2020 but was back down to 10.4 at his last visit but it is now back up to 13.3.  He had a UTI and went to the ER in late July 2020 and was given antibiotics but had Mx species on the culture.    He has not had any further UTI's.  He passed a stone last year.  His IPSS is10.  His UA today is clear.   He had an MRI fusion biopsy in 3/19 for surveillance of his low risk prostate cancer. He had a single core of 20% of gleason 6 disease in the left lateral apex. he had atypia in one core. His PSA was 8.5 in 9/19 and 9.0 in 1/19. He has no associated signs or symptoms.   He was found to have a T2a Nx Mx Gleason 6 prostate cancer with 5% in one left apical and one right apical core in 07/26/13. A repeat biopsy in 8/16 had a single core of Gleason 6 in the left mid lateral gland with 10% involvement. His PSA prior to this visit is up to 8.4 from 6.6 in 5/18. It had been stable for the last 2 years. His Prostate volume was 72ml initally and 75ml on the repeat. He has a history of a 75mm right base prostate nodule and BOO. He has no associated signs or symptoms.   Prostate Biopsy Procedure   Informed consent was obtained after discussing risks/benefits of the procedure.  A time out was performed to ensure correct patient identity.  Pre-Procedure: - Last PSA Level:  09/23/19: PSA 13.3. Lab Results  Component Value Date   PSA 10.4 (H) 03/30/2019   PSA 5.0 (H) 11/18/2013   PSA 5.3 (H) 05/20/2013   - Prostate exam findings: Right base nodule   - Rocephin 1 gm IM given prophylactically - Levaquin 750 mg administered PO -Transrectal Ultrasound performed using the 10MHz probe.    - Seminal Vesicles: normal - Prostate: Asymmetricly enlarged TZ, right > left.   - Prostate volume:  36 gm - Middle lobe: Small  - Hypoechoic/Hyperechoic lesions: left apical PZ -  Prostate calculi: scant.   Procedure: - Prostate block performed using 10 cc 1% lidocaine and needle biopsies taken from sextant areas, a total of 12 under ultrasound guidance.   1 additional cores was obtained from the left apex.   Post-Procedure: - Patient tolerated the procedure well - He was counseled to seek immediate medical attention if experiences any severe pain, significant bleeding, or fevers - He will be called with the biopsy results when available.

## 2019-11-11 ENCOUNTER — Other Ambulatory Visit: Payer: Self-pay | Admitting: Urology

## 2019-11-11 ENCOUNTER — Ambulatory Visit (HOSPITAL_COMMUNITY)
Admission: RE | Admit: 2019-11-11 | Discharge: 2019-11-11 | Disposition: A | Discharge status: Home / Self Care | Payer: Medicare PPO | Source: Ambulatory Visit | Attending: Urology | Admitting: Urology

## 2019-11-11 ENCOUNTER — Ambulatory Visit (INDEPENDENT_AMBULATORY_CARE_PROVIDER_SITE_OTHER): Payer: Medicare PPO | Admitting: Urology

## 2019-11-11 ENCOUNTER — Encounter (HOSPITAL_COMMUNITY): Payer: Self-pay

## 2019-11-11 ENCOUNTER — Other Ambulatory Visit: Payer: Self-pay

## 2019-11-11 DIAGNOSIS — N403 Nodular prostate with lower urinary tract symptoms: Secondary | ICD-10-CM

## 2019-11-11 DIAGNOSIS — R972 Elevated prostate specific antigen [PSA]: Secondary | ICD-10-CM

## 2019-11-11 DIAGNOSIS — C61 Malignant neoplasm of prostate: Secondary | ICD-10-CM

## 2019-11-11 DIAGNOSIS — D075 Carcinoma in situ of prostate: Secondary | ICD-10-CM | POA: Diagnosis not present

## 2019-11-11 MED ORDER — LIDOCAINE HCL (PF) 1 % IJ SOLN
INTRAMUSCULAR | Status: AC
  Administered 2019-11-11: 2.1 mL
  Filled 2019-11-11: qty 5

## 2019-11-11 MED ORDER — CEFTRIAXONE SODIUM 1 G IJ SOLR: 1.0000 g | Freq: Once | INTRAMUSCULAR | Status: AC

## 2019-11-11 MED ORDER — LIDOCAINE HCL (PF) 1 % IJ SOLN: 2.1000 mL | Freq: Once | INTRAMUSCULAR | Status: AC

## 2019-11-11 MED ORDER — LIDOCAINE HCL (PF) 2 % IJ SOLN
INTRAMUSCULAR | Status: AC
  Administered 2019-11-11: 10 mL
  Filled 2019-11-11: qty 10

## 2019-11-11 MED ORDER — CEFTRIAXONE SODIUM 1 G IJ SOLR
INTRAMUSCULAR | Status: AC
  Administered 2019-11-11: 1 g via INTRAMUSCULAR
  Filled 2019-11-11: qty 10

## 2019-11-11 NOTE — Sedation Documentation (Signed)
PT tolerated prostate biopsy procedure well today. Labs obtained and sent for pathology. PT ambulatory at discharge with no acute distress noted and verbalized understanding of discharge instructions. PT to follow up with urologist as scheduled. 

## 2019-11-11 NOTE — Discharge Instructions (Signed)

## 2019-11-18 ENCOUNTER — Telehealth: Payer: Self-pay

## 2019-11-21 ENCOUNTER — Telehealth: Payer: Self-pay

## 2019-11-21 NOTE — Telephone Encounter (Signed)
I gave him the results.  I thought he had a f/u appointment this Friday but I get that got cancelled.   I need to get him in for a cancer consultation in the next few weeks.Marland Kitchen

## 2019-11-21 NOTE — Progress Notes (Signed)
Subjective:  1. Cancer of prostate ([place])   2. Elevated PSA   3. Nodular prostate with lower urinary tract symptoms   4. Nocturia      [name] returns today following a prostate biopsy for his history of low risk prostate cancer on surveillance.   The biopsy demonstrated 3 positive cores with Gleason 6 in the right and left apical lateral cores and Gleason 7(3+4) in the right apical medial core.   The prostate volume was 51m.    His most recent PSA was 13.3 and has been rising.   He had a UTI and went to the ER in late July 2020 and was given antibiotics but had Mx species on the culture.    He has not had any further UTI's.  He passed a stone last year.  His IPSS is10.  His UA today is clear.   He had an MRI fusion biopsy in 3/19 for surveillance of his low risk prostate cancer. He had a single core of 20% of gleason 6 disease in the left lateral apex. he had atypia in one core. His PSA was 8.5 in 9/19 and 9.0 in 1/19. He has no associated signs or symptoms.   He was found to have a T2a Nx Mx Gleason 6 prostate cancer with 5% in one left apical and one right apical core in 07/26/13. A repeat biopsy in 8/16 had a single core of Gleason 6 in the left mid lateral gland with 10% involvement. His PSA prior to this visit is up to 8.4 from 6.6 in 5/18. It had been stable for the last 2 years. His Prostate volume was 239minitally and 3455mn the repeat. He has a history of a 66m60mght base prostate nodule and BOO. He has no associated signs or symptoms.    ROS:  ROS:  A complete review of systems was performed.  All systems are negative except for pertinent findings as noted.   ROS  Allergies  Allergen Reactions  . Morphine Swelling and Other (See Comments)    PT DOESNT REMEMBER /     Outpatient Encounter Medications as of 11/22/2019  Medication Sig  . aspirin 81 MG tablet Take 81 mg by mouth daily.  . atMarland Kitchenrvastatin (LIPITOR) 40 MG tablet Take 1 tablet (40 mg total) by mouth daily.  .  gMarland KitchenipiZIDE (GLUCOTROL XL) 10 MG 24 hr tablet Take 1 tablet (10 mg total) by mouth daily.  . glMarland Kitchencose blood (ONETOUCH VERIO) test strip Test 1X per day and as needed  Dx 250.02  . Lancets (ONETOUCH ULTRASOFT) lancets Patient test 1X per day and prn  Dx 250.02  . lisinopril (ZESTRIL) 2.5 MG tablet Take 1 tablet (2.5 mg total) by mouth daily.  . metFORMIN (GLUCOPHAGE) 1000 MG tablet Take 1 tablet (1,000 mg total) by mouth 2 (two) times daily with a meal.  . metoprolol succinate (TOPROL-XL) 50 MG 24 hr tablet Take 1 tablet (50 mg total) by mouth daily.  . Omega-3 Fatty Acids (FISH OIL) 1000 MG CAPS Take 1 capsule by mouth daily.   . omMarland Kitchenprazole (PRILOSEC) 40 MG capsule Take 1 capsule (40 mg total) by mouth daily.  . vitamin C (ASCORBIC ACID) 500 MG tablet Take 500 mg by mouth daily.     No facility-administered encounter medications on file as of 11/22/2019.    Past Medical History:  Diagnosis Date  [facility]  . Coronary artery disease    3V CABG 2000.  Last heart cath 2005, with 2 patent grafts  . Diabetes mellitus   . Hyperlipidemia   . Hypertension     Past Surgical History:  Procedure Laterality Date  . APPENDECTOMY    . CARDIAC CATHETERIZATION  2005  . CORONARY ARTERY BYPASS GRAFT  2000   3 vessel  . HERNIA REPAIR  1974  . PROSTATE BIOPSY  06/2017   x 4-5     Social History   Socioeconomic History  . Marital status: Married    Spouse name: Jacob Rios   . Number of children: 3  . Years of education: Not on file  . Highest education level: Not on file  Occupational History  . Occupation: part-time Pharmacist, community    Comment: retired   Tobacco Use  . Smoking status: Former Smoker    Quit date: 01/13/1997    Years since quitting: 22.8  . Smokeless tobacco: Never Used  Vaping Use  . Vaping Use: Never used  Substance and Sexual Activity  . Alcohol use: No  . Drug use: No  . Sexual activity: Not on file  Other Topics Concern  . Not on file  Social History  Narrative  . Not on file   Social Determinants of Health   Financial Resource Strain:   . Difficulty of Paying Living Expenses: Not on file  Food Insecurity:   . Worried About Charity fundraiser in the Last Year: Not on file  . Ran Out of Food in the Last Year: Not on file  Transportation Needs:   . Lack of Transportation (Medical): Not on file  . Lack of Transportation (Non-Medical): Not on file  Physical Activity:   . Days of Exercise per Week: Not on file  . Minutes of Exercise per Session: Not on file  Stress:   . Feeling of Stress : Not on file  Social Connections:   . Frequency of Communication with Friends and Family: Not on file  . Frequency of Social Gatherings with Friends and Family: Not on file  . Attends Religious Services: Not on file  . Active Member of Clubs or Organizations: Not on file  . Attends Archivist Meetings: Not on file  . Marital Status: Not on file  Intimate Partner Violence:   . Fear of Current or Ex-Partner: Not on file  . Emotionally Abused: Not on file  . Physically Abused: Not on file  . Sexually Abused: Not on file    Family History  Problem Relation Age of Onset  . Lung cancer Mother   . Cancer Mother   . Dementia Father   . Hyperlipidemia Father   . Congestive Heart Failure Father   . Post-traumatic stress disorder Son   . Alcohol abuse Maternal Grandfather   . Cancer Maternal Grandfather 90       stomach cancer   . Heart attack Neg Hx   . Stroke Neg Hx        Objective: Vitals:   11/22/19 1541  BP: 114/68  Pulse: 80  Temp: 98.1 F (36.7 C)     Physical Exam  Lab Results:  No results found for this or any previous visit (from the past 24 hour(s)).  BMET No results for input(s): NA, K, CL, CO2, GLUCOSE, BUN, CREATININE, CALCIUM in the last 72 hours. PSA PSA  Date Value Ref Range Status  03/30/2019 10.4 (H) < OR = 4.0 ng/mL Final    Comment:    The total PSA value from this assay system  is   standardized against the WHO standard. The test  result will be approximately 20% lower when compared  to the equimolar-standardized total PSA (Beckman  Coulter). Comparison of serial PSA results should be  interpreted with this fact in mind. . This test was performed using the Siemens  chemiluminescent method. Values obtained from  different assay methods cannot be used interchangeably. PSA levels, regardless of value, should not be interpreted as absolute evidence of the presence or absence of disease.   11/18/2013 5.0 (H) 0.0 - 4.0 ng/mL Final    Comment:    Roche ECLIA methodology. According to the American Urological Association, Serum PSA should decrease and remain at undetectable levels after radical prostatectomy. The AUA defines biochemical recurrence as an initial PSA value 0.2 ng/mL or greater followed by a subsequent confirmatory PSA value 0.2 ng/mL or greater. Values obtained with different assay methods or kits cannot be used interchangeably. Results cannot be interpreted as absolute evidence of the presence or absence of malignant disease.   05/20/2013 5.3 (H) 0.0 - 4.0 ng/mL Final    Comment:    Roche ECLIA methodology. According to the American Urological Association, Serum PSA should decrease and remain at undetectable levels after radical prostatectomy. The AUA defines biochemical recurrence as an initial PSA value 0.2 ng/mL or greater followed by a subsequent confirmatory PSA value 0.2 ng/mL or greater. Values obtained with different assay methods or kits cannot be used interchangeably. Results cannot be interpreted as absolute evidence of the presence or absence of malignant disease.   No results found for: TESTOSTERONE  Recent Results (from the past 2160 hour(s))  PSA     Status: Abnormal   Collection Time: 09/23/19 10:07 AM  Result Value Ref Range   Prostate Specific Ag, Serum 13.3 (H) 0.0 - 4.0 ng/mL    Comment: Roche ECLIA methodology. According  to the American Urological Association, Serum PSA should decrease and remain at undetectable levels after radical prostatectomy. The AUA defines biochemical recurrence as an initial PSA value 0.2 ng/mL or greater followed by a subsequent confirmatory PSA value 0.2 ng/mL or greater. Values obtained with different assay methods or kits cannot be used interchangeably. Results cannot be interpreted as absolute evidence of the presence or absence of malignant disease.   Urinalysis, Routine w reflex microscopic     Status: Abnormal   Collection Time: 09/30/19 10:20 AM  Result Value Ref Range   Specific Gravity, UA 1.020 1.005 - 1.030   pH, UA 5.5 5.0 - 7.5   Color, UA Yellow Yellow   Appearance Ur Clear Clear   Leukocytes,UA Negative Negative   Protein,UA Trace (A) Negative/Trace   Glucose, UA Negative Negative   Ketones, UA Trace (A) Negative   RBC, UA Negative Negative   Bilirubin, UA Negative Negative   Urobilinogen, Ur 1.0 0.2 - 1.0 mg/dL   Nitrite, UA Negative Negative   Microscopic Examination Comment     Comment: Microscopic follows if indicated.  Bayer DCA Hb A1c Waived     Status: None   Collection Time: 10/17/19 10:33 AM  Result Value Ref Range   HB A1C (BAYER DCA - WAIVED) 5.9 <7.0 %    Comment:                                       Diabetic Adult            <7.0  Healthy Adult        4.3 - 5.7                                                           (DCCT/NGSP) American Diabetes Association's Summary of Glycemic Recommendations for Adults with Diabetes: Hemoglobin A1c <7.0%. More stringent glycemic goals (A1c <6.0%) may further reduce complications at the cost of increased risk of hypoglycemia.   CBC with Differential/Platelet     Status: None   Collection Time: 10/17/19 11:10 AM  Result Value Ref Range   WBC 6.0 3.4 - 10.8 x10E3/uL   RBC 4.35 4.14 - 5.80 x10E6/uL   Hemoglobin 13.4 13.0 - 17.7 g/dL   Hematocrit 38.2 37.5 - 51.0 %    MCV 88 79 - 97 fL   MCH 30.8 26.6 - 33.0 pg   MCHC 35.1 31 - 35 g/dL   RDW 13.9 11.6 - 15.4 %   Platelets 165 150 - 450 x10E3/uL   Neutrophils 64 Not Estab. %   Lymphs 22 Not Estab. %   Monocytes 8 Not Estab. %   Eos 5 Not Estab. %   Basos 1 Not Estab. %   Neutrophils Absolute 3.9 1.40 - 7.00 x10E3/uL   Lymphocytes Absolute 1.3 0 - 3 x10E3/uL   Monocytes Absolute 0.5 0 - 0 x10E3/uL   EOS (ABSOLUTE) 0.3 0.0 - 0.4 x10E3/uL   Basophils Absolute 0.1 0 - 0 x10E3/uL   Immature Granulocytes 0 Not Estab. %   Immature Grans (Abs) 0.0 0.0 - 0.1 x10E3/uL  CMP14+EGFR     Status: Abnormal   Collection Time: 10/17/19 11:10 AM  Result Value Ref Range   Glucose 96 65 - 99 mg/dL   BUN 14 8 - 27 mg/dL   Creatinine, Ser 1.38 (H) 0.76 - 1.27 mg/dL   GFR calc non Af Amer 50 (L) >59 mL/min/1.73   GFR calc Af Amer 58 (L) >59 mL/min/1.73    Comment: **[facility] currently reports eGFR in compliance with the current**   recommendations of the [employer]. [facility] will   update reporting as new guidelines are published from the NKF-ASN   Task force.    BUN/Creatinine Ratio 10 10 - 24   Sodium 140 134 - 144 mmol/L   Potassium 4.8 3.5 - 5.2 mmol/L   Chloride 105 96 - 106 mmol/L   CO2 22 20 - 29 mmol/L   Calcium 9.3 8.6 - 10.2 mg/dL   Total Protein 6.7 6.0 - 8.5 g/dL   Albumin 4.1 3.7 - 4.7 g/dL   Globulin, Total 2.6 1.5 - 4.5 g/dL   Albumin/Globulin Ratio 1.6 1.2 - 2.2   Bilirubin Total 0.4 0.0 - 1.2 mg/dL   Alkaline Phosphatase 90 44 - 121 IU/L    Comment:               **Please note reference interval change**   AST 13 0 - 40 IU/L   ALT 15 0 - 44 IU/L  Lipid panel     Status: Abnormal   Collection Time: 10/17/19 11:10 AM  Result Value Ref Range   Cholesterol, Total 103 100 - 199 mg/dL   Triglycerides 86 0 - 149 mg/dL   HDL 31 (L) >39 mg/dL   VLDL Cholesterol Cal 17 5 - 40 mg/dL  LDL Chol Calc (NIH) 55 0 - 99 mg/dL   Chol/HDL Ratio 3.3 0.0 - 5.0 ratio    Comment:                                    T. Chol/HDL Ratio                                             Men  Women                               1/2 Avg.Risk  3.4    3.3                                   Avg.Risk  5.0    4.4                                2X Avg.Risk  9.6    7.1                                3X Avg.Risk 23.4   11.0      Studies/Results: Path report reviewed.     Assessment & Plan: Prostate cancer with grade progression on recent biopsy.  He T2a Nx Mx Gleason 7(3+4) disease.  He has intermediate risk disease and a rising PSA of 13.3.  His prostate volume is only 29m.  I am going to get him staged with a CT and bone scan and will have him set up to see Dr. MTammi Klippelfor consideration of a seed implant or EXRT.  With his comorbidities I don't think he is the best candidate for surgical therapy.   No orders of the defined types were placed in this encounter.    Orders Placed This Encounter  Procedures  . CT ABDOMEN PELVIS W CONTRAST    Standing Status:   Future    Standing Expiration Date:   12/22/2019    Order Specific Question:   If indicated for the ordered procedure, I authorize the administration of contrast media per Radiology protocol    Answer:   Yes    Order Specific Question:   [facility]   Order Specific Question:   Is Oral Contrast requested for this exam?    Answer:   No oral contrast    Order Specific Question:   Reason for No Oral Contrast    Answer:   Other    Order Specific Question:   Please answer why no oral contrast is requested    Answer:   not needed    Order Specific Question:   Radiology Contrast Protocol - do NOT remove file path    Answer:   [email or web address]  . NM Bone Scan Whole Body    Standing Status:   Future    Standing Expiration Date:   12/22/2019    Order Specific Question:   If indicated for the ordered procedure, I authorize the administration of a  radiopharmaceutical per Radiology protocol  Answer:   Yes    Order Specific Question:   Preferred imaging location?    Answer:   [facility]    Order Specific Question:   Radiology Contrast Protocol - do NOT remove file path    Answer:   [email or web address]  . Ambulatory referral to Radiation Oncology    Referral Priority:   Routine    Referral Type:   Consultation    Referral Reason:   Specialty Services Required    Referred to Provider:   Tyler Pita, MD    Requested Specialty:   Radiation Oncology    Number of Visits Requested:   1      Return for F/U will be contingent on staging and radiation oncology opinion. .   CC: [name], FNP      [name] 11/22/2019

## 2019-11-21 NOTE — Telephone Encounter (Signed)
appt scheduled with pt for 11/9  End of day.

## 2019-11-21 NOTE — Telephone Encounter (Signed)
-----   Message from [name], MD sent at 11/21/2019 12:06 PM EST ----- I have given him his results.   When I last looked, I thought he was to see me on 11/12 but that doesn't appear to bet the case.   He will need to get set up for a 73min visit to discuss his options again.

## 2019-11-21 NOTE — Progress Notes (Signed)
I have given him his results.   When I last looked, I thought he was to see me on 11/12 but that doesn't appear to bet the case.   He will need to get set up for a 71min visit to discuss his options again.

## 2019-11-22 ENCOUNTER — Ambulatory Visit (INDEPENDENT_AMBULATORY_CARE_PROVIDER_SITE_OTHER): Payer: Medicare PPO | Admitting: Urology

## 2019-11-22 ENCOUNTER — Encounter: Payer: Self-pay | Admitting: Urology

## 2019-11-22 ENCOUNTER — Other Ambulatory Visit: Payer: Self-pay

## 2019-11-22 VITALS — BP 114/68 | HR 80 | Temp 98.1°F | Ht 71.0 in | Wt 229.0 lb

## 2019-11-22 DIAGNOSIS — R972 Elevated prostate specific antigen [PSA]: Secondary | ICD-10-CM

## 2019-11-22 DIAGNOSIS — N403 Nodular prostate with lower urinary tract symptoms: Secondary | ICD-10-CM | POA: Diagnosis not present

## 2019-11-22 DIAGNOSIS — C61 Malignant neoplasm of prostate: Secondary | ICD-10-CM

## 2019-11-22 DIAGNOSIS — R351 Nocturia: Secondary | ICD-10-CM

## 2019-11-22 NOTE — Telephone Encounter (Signed)
Pt called. Appt given for 11/9 pm.

## 2019-11-23 ENCOUNTER — Other Ambulatory Visit: Payer: Self-pay

## 2019-11-23 DIAGNOSIS — C61 Malignant neoplasm of prostate: Secondary | ICD-10-CM

## 2019-11-28 ENCOUNTER — Encounter (HOSPITAL_COMMUNITY)
Admission: RE | Admit: 2019-11-28 | Discharge: 2019-11-28 | Disposition: A | Discharge status: Home / Self Care | Payer: Medicare PPO | Source: Ambulatory Visit | Attending: Urology | Admitting: Urology

## 2019-11-28 ENCOUNTER — Other Ambulatory Visit: Payer: Self-pay

## 2019-11-28 DIAGNOSIS — C61 Malignant neoplasm of prostate: Secondary | ICD-10-CM | POA: Insufficient documentation

## 2019-11-28 MED ORDER — TECHNETIUM TC 99M MEDRONATE IV KIT
20.0000 | PACK | Freq: Once | INTRAVENOUS | Status: AC | PRN
  Administered 2019-11-28: 19 via INTRAVENOUS

## 2019-11-29 NOTE — Progress Notes (Signed)
Bone scan is negative

## 2019-11-30 NOTE — Progress Notes (Signed)
Letter mailed and sent via my chart

## 2019-12-18 ENCOUNTER — Other Ambulatory Visit: Payer: Self-pay | Admitting: Nurse Practitioner

## 2019-12-18 DIAGNOSIS — I10 Essential (primary) hypertension: Secondary | ICD-10-CM

## 2019-12-19 ENCOUNTER — Encounter: Payer: Self-pay | Admitting: Radiation Oncology

## 2019-12-19 ENCOUNTER — Ambulatory Visit (HOSPITAL_COMMUNITY)
Admission: RE | Admit: 2019-12-19 | Discharge: 2019-12-19 | Disposition: A | Discharge status: Home / Self Care | Payer: Medicare PPO | Source: Ambulatory Visit | Attending: Urology | Admitting: Urology

## 2019-12-19 ENCOUNTER — Other Ambulatory Visit: Payer: Self-pay

## 2019-12-19 DIAGNOSIS — C61 Malignant neoplasm of prostate: Secondary | ICD-10-CM | POA: Insufficient documentation

## 2019-12-19 LAB — POCT I-STAT CREATININE: Creatinine, Ser: 1.6 mg/dL — ABNORMAL HIGH (ref 0.61–1.24)

## 2019-12-19 MED ORDER — IOHEXOL 300 MG/ML  SOLN
100.0000 mL | Freq: Once | INTRAMUSCULAR | Status: AC | PRN
  Administered 2019-12-19: 100 mL via INTRAVENOUS

## 2019-12-19 NOTE — Progress Notes (Signed)
GU Location of Tumor / Histology: prostatic adenocarcinoma  If Prostate Cancer, Gleason Score is (3 + 4) and PSA is (13.3). Prostate volume: 36 mL.  Biopsies of prostate (if applicable) revealed:    Past/Anticipated interventions by urology, if any: prostate biopsy, CT abd/pelvis (negative), Bone scan (negative), referral to Dr. Tammi Klippel for consideration of seed implant  Past/Anticipated interventions by medical oncology, if any: no  Weight changes, if any: denies  Bowel/Bladder complaints, if any: IPSS 7. SHIM 1. Denies dysuria, hematuria, urinary leakage or incontinence. Denies any bowel complaints.    Nausea/Vomiting, if any: denies  Pain issues, if any:  denies  SAFETY ISSUES:  Prior radiation? denies  Pacemaker/ICD? Denies; 3 stents and bypass surgery  Possible current pregnancy? no, male patient  Is the patient on methotrexate? denies  Current Complaints / other details:  74 year old male. Married to [place] with three children. Mother with hx of lung cancer. Maternal GF with hx of stomach cancer.

## 2019-12-20 ENCOUNTER — Telehealth: Payer: Self-pay

## 2019-12-20 ENCOUNTER — Ambulatory Visit
Admission: RE | Admit: 2019-12-20 | Discharge: 2019-12-20 | Disposition: A | Discharge status: Home / Self Care | Payer: Medicare PPO | Source: Ambulatory Visit | Attending: Radiation Oncology | Admitting: Radiation Oncology

## 2019-12-20 ENCOUNTER — Encounter: Payer: Self-pay | Admitting: Radiation Oncology

## 2019-12-20 VITALS — Ht 71.0 in | Wt 235.0 lb

## 2019-12-20 DIAGNOSIS — C61 Malignant neoplasm of prostate: Secondary | ICD-10-CM | POA: Diagnosis not present

## 2019-12-20 HISTORY — DX: Malignant neoplasm of prostate: C61

## 2019-12-20 NOTE — Progress Notes (Signed)
Radiation Oncology         [phone] ________________________________  Initial Outpatient Consultation - Conducted via Telephone due to current COVID-19 concerns for limiting patient exposure  Name: Jacob Rios MRN: [id number]  Date: 12/20/2019  DOB: 03-23-1945  VP:[id number], [name], FNP  [name], MD   REFERRING PHYSICIAN: [name], MD  DIAGNOSIS: 74 y.o. gentleman with Stage T2a adenocarcinoma of the prostate with Gleason score of 3+4, and PSA of 13.3.    ICD-10-CM   1. Malignant neoplasm of prostate ([place])  C61     HISTORY OF PRESENT ILLNESS: Jacob Rios is a 74 y.o. male with a diagnosis of prostate cancer. He has been followed by [facility] for an elevated PSA and a nodular prostate since at least 2015. He was initially diagnosed with very low volume Gleason 3+3 prostate cancer on 07/26/13 by Dr. Jeffie Pollock with a PSA of 8.4. He has subsequently undergone surveillance biopsy in 08/2014 and MRI fusion biopsy in 03/2017, both of which again showed low volume Gleason 3+3 disease.  Since initial diagnosis, his PSA remained fairly stable in the 8-9 range until 08/2018 when the PSA was increased to 12.9. The PSA decreased to 10.4 on 04/08/19 but most recently, increased up to 13.3 in 09/2019. The patient proceeded to transrectal ultrasound with 12 biopsies of the prostate on 11/11/2019,  digital rectal examination was performed at that time revealing a stable right base nodule  The prostate volume measured 36 cc.  Out of 13 core biopsies, 4 were positive. The maximum Gleason score was 3+4, and this was seen in the right apex, and an additional left apex core (labeled #13 on pathology). Additionally, Gleason 3+3 was seen in the right apex lateral (3%) and left apex lateral.  He underwent staging imaging with a bone scan on 11/28/2019 showing no definite signs of bony metastatic disease. A CT A/P was performed on 12/19/2019 which was also negative for evidence of metastatic  disease.  The patient reviewed the biopsy results with his urologist and he has kindly been referred today for discussion of potential radiation treatment options.   PREVIOUS RADIATION THERAPY: No  PAST MEDICAL HISTORY:  Past Medical History:  Diagnosis Date  . Coronary artery disease    3V CABG 2000. Last heart cath 2005, with 2 patent grafts  . Diabetes mellitus   . Hyperlipidemia   . Hypertension   . Prostate cancer ([place])       PAST SURGICAL HISTORY: Past Surgical History:  Procedure Laterality Date  . APPENDECTOMY    . CARDIAC CATHETERIZATION  2005  . CORONARY ARTERY BYPASS GRAFT  2000   3 vessel  . HERNIA REPAIR  1974  . PROSTATE BIOPSY  06/2017   x 4-5     FAMILY HISTORY:  Family History  Problem Relation Age of Onset  . Lung cancer Mother   . Dementia Father   . Hyperlipidemia Father   . Congestive Heart Failure Father   . Post-traumatic stress disorder Son   . Alcohol abuse Maternal Grandfather   . Cancer Maternal Grandfather 90       stomach cancer   . Heart attack Neg Hx   . Stroke Neg Hx   . Breast cancer Neg Hx   . Colon cancer Neg Hx   . Prostate cancer Neg Hx   . Pancreatic cancer Neg Hx     SOCIAL HISTORY:  Social History   Socioeconomic History  . Marital status: Married    Spouse  name: Vaughan Basta   . Number of children: 3  . Years of education: Not on file  . Highest education level: Not on file  Occupational History  . Occupation: part-time Pharmacist, community    Comment: retired   Tobacco Use  . Smoking status: Former Smoker    Packs/day: 4.00    Years: 45.00    Pack years: 180.00    Types: Cigarettes    Quit date: 01/13/1997    Years since quitting: 22.9  . Smokeless tobacco: Never Used  . Tobacco comment: reports he began smoking at age 71  Vaping Use  . Vaping Use: Never used  Substance and Sexual Activity  . Alcohol use: No  . Drug use: No  . Sexual activity: Not Currently  Other Topics Concern  . Not on file  Social History Narrative   . Not on file   Social Determinants of Health   Financial Resource Strain: Not on file  Food Insecurity: Not on file  Transportation Needs: Not on file  Physical Activity: Not on file  Stress: Not on file  Social Connections: Not on file  Intimate Partner Violence: Not on file    ALLERGIES: Morphine  MEDICATIONS:  Current Outpatient Medications  Medication Sig Dispense Refill  . aspirin 81 MG tablet Take 81 mg by mouth daily.    Marland Kitchen atorvastatin (LIPITOR) 40 MG tablet Take 1 tablet (40 mg total) by mouth daily. 90 tablet 1  . glipiZIDE (GLUCOTROL XL) 10 MG 24 hr tablet Take 1 tablet (10 mg total) by mouth daily. 90 tablet 1  . glucose blood (ONETOUCH VERIO) test strip Test 1X per day and as needed  Dx 250.02 100 each 12  . Lancets (ONETOUCH ULTRASOFT) lancets Patient test 1X per day and prn  Dx 250.02 100 each 12  . lisinopril (ZESTRIL) 2.5 MG tablet Take 1 tablet (2.5 mg total) by mouth daily. 90 tablet 1  . metFORMIN (GLUCOPHAGE) 1000 MG tablet Take 1 tablet (1,000 mg total) by mouth 2 (two) times daily with a meal. 180 tablet 1  . metoprolol succinate (TOPROL-XL) 50 MG 24 hr tablet Take 1 tablet by mouth once daily 90 tablet 0  . omeprazole (PRILOSEC) 40 MG capsule Take 1 capsule (40 mg total) by mouth daily. 90 capsule 1  . vitamin C (ASCORBIC ACID) 500 MG tablet Take 500 mg by mouth daily.       No current facility-administered medications for this encounter.    REVIEW OF SYSTEMS:  On review of systems, the patient reports that he is doing well overall. He denies any chest pain, shortness of breath, cough, fevers, chills, night sweats, unintended weight changes. He denies any bowel disturbances, and denies abdominal pain, nausea or vomiting. He denies any new musculoskeletal or joint aches or pains. His IPSS was 7, indicating mild urinary symptoms. His SHIM was 1, indicating he has severe erectile dysfunction. A complete review of systems is obtained and is otherwise  negative.    PHYSICAL EXAM:  Wt Readings from Last 3 Encounters:  12/20/19 235 lb (106.6 kg)  11/22/19 229 lb (103.9 kg)  10/17/19 229 lb (103.9 kg)   Temp Readings from Last 3 Encounters:  11/22/19 98.1 F (36.7 C)  11/11/19 98 F (36.7 C) (Oral)  10/17/19 (!) 97.5 F (36.4 C) (Temporal)   BP Readings from Last 3 Encounters:  11/22/19 114/68  11/11/19 131/77  10/17/19 130/75   Pulse Readings from Last 3 Encounters:  11/22/19 80  11/11/19 69  10/17/19  66   Pain Assessment Pain Score: 0-No pain/10  Physical exam not performed in light of telephone encounter.   KPS = 90  100 - Normal; no complaints; no evidence of disease. 90   - Able to carry on normal activity; minor signs or symptoms of disease. 80   - Normal activity with effort; some signs or symptoms of disease. 72   - Cares for self; unable to carry on normal activity or to do active work. 60   - Requires occasional assistance, but is able to care for most of his personal needs. 50   - Requires considerable assistance and frequent medical care. 68   - Disabled; requires special care and assistance. 64   - Severely disabled; hospital admission is indicated although death not imminent. 15   - Very sick; hospital admission necessary; active supportive treatment necessary. 10   - Moribund; fatal processes progressing rapidly. 0     - Dead  Karnofsky DA, Abelmann Lawtey, [place] LS and Burchenal [facility] [phone]) The use of the nitrogen mustards in the palliative treatment of carcinoma: with particular reference to bronchogenic carcinoma Cancer 1 634-56  LABORATORY DATA:  Lab Results  Component Value Date   WBC 6.0 10/17/2019   HGB 13.4 10/17/2019   HCT 38.2 10/17/2019   MCV 88 10/17/2019   PLT 165 10/17/2019   Lab Results  Component Value Date   NA 140 10/17/2019   K 4.8 10/17/2019   CL 105 10/17/2019   CO2 22 10/17/2019   Lab Results  Component Value Date   ALT 15 10/17/2019   AST 13 10/17/2019   ALKPHOS 90  10/17/2019   BILITOT 0.4 10/17/2019     RADIOGRAPHY: NM Bone Scan Whole Body  Result Date: 11/28/2019 CLINICAL DATA:  Prostate cancer staging, elevated PSA with increased grade of tumor on prostate biopsy EXAM: NUCLEAR MEDICINE WHOLE BODY BONE SCAN TECHNIQUE: Whole body anterior and posterior images were obtained approximately 3 hours after intravenous injection of radiopharmaceutical. RADIOPHARMACEUTICALS:  19 mCi Technetium-42m MDP IV COMPARISON:  None FINDINGS: Signs of renal uptake and excretion into the urinary bladder with some apparent urinary contamination along the scrotum. Uptake about the knees RIGHT greater than LEFT and about the ankles likely reflects degenerative change. Slightly asymmetric uptake about the LEFT sternoclavicular region likely reflects degenerative change about the sternoclavicular joint LEFT greater than RIGHT. No sign of focal uptake to suggest bony involvement/metastatic disease. Area of subtle uptake in the mid lumbar spine likely reflects degenerative process. IMPRESSION: No definite signs of bony metastatic disease with areas of suspected degenerative change as outlined above. Electronically Signed   By: Zetta Bills M.D.   On: 11/28/2019 17:12   CT ABDOMEN PELVIS W CONTRAST  Result Date: 12/20/2019 CLINICAL DATA:  Prostate cancer, PSA 13. Increasing grade of tumor on prostate biopsy. EXAM: CT ABDOMEN AND PELVIS WITH CONTRAST TECHNIQUE: Multidetector CT imaging of the abdomen and pelvis was performed using the standard protocol following bolus administration of intravenous contrast. CONTRAST:  124mL OMNIPAQUE IOHEXOL 300 MG/ML  SOLN COMPARISON:  Multiple exams, including Bone scan 11/28/2019 FINDINGS: Lower chest: Aortic and coronary atherosclerosis. Prior median sternotomy. Hepatobiliary: Unremarkable Pancreas: Unremarkable Spleen: The spleen measures 15.3 by 4.7 by 11.4 cm (volume = 430 cm^3), borderline prominent. No focal splenic lesion identified.  Adrenals/Urinary Tract: Bilateral fluid density lesions of the kidneys favor cysts although some are technically too small to characterize. Adrenal glands appear normal. No appreciable urinary tract calculi. Urinary bladder unremarkable. Stomach/Bowel: There  is a large periampullary duodenal diverticulum but without findings of inflammation. Vascular/Lymphatic: Aortoiliac atherosclerotic vascular disease. No pathologic adenopathy is identified. Reproductive: Upper normal prostate gland size. Other: No supplemental non-categorized findings. Musculoskeletal: Mild degenerative sclerosis along the pubis. Chondrocalcinosis in the acetabular labrum bilaterally. Degenerative spurring of both hips. Chronic appearing superior endplate compression at L1. Grade 1 degenerative retrolisthesis at L1-2 and L2-3. Facet arthropathy contributes to mild bilateral foraminal stenosis at L5-S1. IMPRESSION: 1. No findings of adenopathy or other findings of metastatic disease. 2. Borderline splenomegaly. 3. Other imaging findings of potential clinical significance: Coronary atherosclerosis. Large periampullary duodenal diverticulum. Degenerative spurring of both hips. Chondrocalcinosis in the acetabular labrum bilaterally. Chronic appearing superior endplate compression at L1. 4. Aortic atherosclerosis. Aortic Atherosclerosis (ICD10-I70.0). Electronically Signed   By: Van Clines M.D.   On: 12/20/2019 10:11      IMPRESSION/PLAN: This visit was conducted via Telephone to spare the patient unnecessary potential exposure in the healthcare setting during the current COVID-19 pandemic. 1. 74 y.o. gentleman with Stage T2a adenocarcinoma of the prostate with Gleason Score of 3+4, and PSA of 13.3. We discussed the patient's workup and outlined the nature of prostate cancer in this setting. The patient's T stage, Gleason's score, and PSA put him into the unfavorable intermediate risk group. Accordingly, he is eligible for a variety of  potential treatment options including brachytherapy, 5.5 weeks of external radiation, or prostatectomy. We discussed the available radiation techniques, and focused on the details and logistics of delivery. We discussed and outlined the risks, benefits, short and long-term effects associated with radiotherapy and compared and contrasted these with prostatectomy. We discussed the role of SpaceOAR in reducing the rectal toxicity associated with radiotherapy. He appears to have a good understanding of his disease and our treatment recommendations which are of curative intent.  He was encouraged to ask questions that were answered to his stated satisfaction.  At the end of the conversation, the patient is interested in proceeding with brachytherapy and use of SpaceOAR gel to reduce rectal toxicity from radiotherapy.  We will share our discussion with Dr. Jeffie Pollock and move forward with scheduling his [facility] planning appointment in the near future.  The patient will be contacted by Romie Jumper in our office who will be working closely with him to coordinate OR scheduling and pre and post procedure appointments.  We will contact the pharmaceutical rep to ensure that [place] is available at the time of procedure.  We enjoyed meeting him today and look forward to continuing to participate in his care.  Given current concerns for patient exposure during the COVID-19 pandemic, this encounter was conducted via telephone. The patient was notified in advance and was offered a MyChart meeting to allow for face to face communication but unfortunately reported that he did not have the appropriate resources/technology to support such a visit and instead preferred to proceed with telephone consult. The patient has given verbal consent for this type of encounter. The time spent during this encounter was 60 minutes. The attendants for this meeting include Tyler Pita MD, Ashlyn Bruning PA-C, [place], and  patient, Jacob Rios. During the encounter, Tyler Pita MD, Ashlyn Bruning PA-C, and scribe, Wilburn Mylar were located at [facility].  Patient, Jacob Rios was located at home.    [name], PA-C    Tyler Pita, MD  [facility] Direct Dial: [phone]  Fax: [phone] [email or web address]  Skype  [email or web address]  This document serves as a record of services personally performed by Tyler Pita, MD and Freeman Caldron, PA-C. It was created on their behalf by Wilburn Mylar, a trained medical scribe. The creation of this record is based on the scribe's personal observations and the provider's statements to them. This document has been checked and approved by the attending provider.

## 2019-12-20 NOTE — Telephone Encounter (Signed)
-----   Message from [name], MD sent at 12/20/2019 12:11 PM EST ----- There is no evidence of metastatic disease.  Please forward the results to his PCP so they can evaluate findings 2-4 from the report.

## 2019-12-21 NOTE — Telephone Encounter (Signed)
Pt notified of no metastasis per Dr. Jeffie Pollock.

## 2019-12-29 ENCOUNTER — Other Ambulatory Visit: Payer: Self-pay | Admitting: Urology

## 2019-12-29 DIAGNOSIS — C61 Malignant neoplasm of prostate: Secondary | ICD-10-CM

## 2019-12-30 ENCOUNTER — Telehealth: Payer: Self-pay | Admitting: *Deleted

## 2019-12-30 NOTE — Telephone Encounter (Signed)
Called patient to inform of pre-seed appts. for 02-02-20 and his implant on 03-09-20, spoke with patient and he is aware of these appts.

## 2020-01-04 ENCOUNTER — Telehealth: Payer: Self-pay | Admitting: Radiation Oncology

## 2020-01-04 NOTE — Telephone Encounter (Signed)
Received voicemail message from patient requesting a return call. Phoned patient to inquire. Patient questions why Dr. Tammi Klippel feels like the seeds are better suited for him than external beam radiation. Patient questions if Dr. Tammi Klippel has taken his cardiac history into consideration. Assured patient Dr. Tammi Klippel is aware of his cardiac history and will obtain cardiac clearance prior to the procedure on 03/09/20. Explained that the pre seed appointment on 02/02/2019 is to obtain a planning scan, consent, and provide him with more information about the procedure. Questioned if he could wait until that appointment to have those questions answered. Patient requested a call from Dr. Tammi Klippel or Ailene Ards before then. Patient understands this RN will route his request to the providers.

## 2020-01-08 ENCOUNTER — Other Ambulatory Visit: Payer: Self-pay | Admitting: Nurse Practitioner

## 2020-01-08 DIAGNOSIS — I10 Essential (primary) hypertension: Secondary | ICD-10-CM

## 2020-01-10 ENCOUNTER — Encounter: Payer: Self-pay | Admitting: Medical Oncology

## 2020-01-10 ENCOUNTER — Ambulatory Visit: Payer: Medicare PPO | Admitting: Family Medicine

## 2020-01-10 NOTE — Progress Notes (Signed)
Patient called asking if he get brachytherapy, can he ever ride a Surveyor, mining. I informed him that it is best not to ride on a mower for about 2 weeks post procedure but then he should be fine. He has read that you can not ride a mower and thought it was indefinite.

## 2020-01-10 NOTE — Progress Notes (Signed)
Spoke with patient to introduce myself as the prostate nurse navigator and discuss my role. I was unable to meet him during consult with Dr. Francesco Runner, PA. He had decided on brachytherapy as treatment but after giving it some thought, had questions. He asked  why Dr. Kathrynn Running feels like the seeds are better suited for him than external beam radiation and questions if Dr. Kathrynn Running has taken his cardiac history into consideration. He was assured that Dr. Kathrynn Running is aware of his cardiac history and will obtain cardiac clearance prior to the procedure on 03/09/20.  Dr. Kathrynn Running thought the brachytherapy procedure would be more convenient since he lives in [place] and would be travelling daily for XRT for 5.5 weeks versus a single trip to [facility] for an, outpatient procedure. I explained that both would be equally effective so if he prefers the daily XRT, we would just need to let Dr. Annabell Howells know, so that we can arrange for fiducial markers and SpaceOAR gel placement, prior to CT SIM, in preparation for the daily radiation. He then informs me that everyone he knows that had seeds died. We discussed prostate cancer treatments, stages and that most likely, these men died from other illnesses not related to prostate cancer. After discussion, he feels much better and would like to proceed with brachytherapy. I asked if he would like to speak someone who has been treated with brachytherapy and he would. I gave him my contact information and asked him to call me with questions or concerns. He voiced understanding.

## 2020-01-20 ENCOUNTER — Other Ambulatory Visit: Payer: Self-pay

## 2020-01-20 ENCOUNTER — Ambulatory Visit (INDEPENDENT_AMBULATORY_CARE_PROVIDER_SITE_OTHER): Payer: Medicare PPO

## 2020-01-20 ENCOUNTER — Encounter: Payer: Self-pay | Admitting: Nurse Practitioner

## 2020-01-20 ENCOUNTER — Ambulatory Visit (INDEPENDENT_AMBULATORY_CARE_PROVIDER_SITE_OTHER): Payer: Medicare PPO | Admitting: Nurse Practitioner

## 2020-01-20 VITALS — BP 135/75 | HR 77 | Temp 97.8°F | Resp 20 | Ht 71.0 in | Wt 237.0 lb

## 2020-01-20 DIAGNOSIS — I10 Essential (primary) hypertension: Secondary | ICD-10-CM | POA: Diagnosis not present

## 2020-01-20 DIAGNOSIS — E782 Mixed hyperlipidemia: Secondary | ICD-10-CM

## 2020-01-20 DIAGNOSIS — M25532 Pain in left wrist: Secondary | ICD-10-CM | POA: Diagnosis not present

## 2020-01-20 DIAGNOSIS — N183 Chronic kidney disease, stage 3 unspecified: Secondary | ICD-10-CM

## 2020-01-20 DIAGNOSIS — E1122 Type 2 diabetes mellitus with diabetic chronic kidney disease: Secondary | ICD-10-CM

## 2020-01-20 DIAGNOSIS — E119 Type 2 diabetes mellitus without complications: Secondary | ICD-10-CM

## 2020-01-20 DIAGNOSIS — Z6835 Body mass index (BMI) 35.0-35.9, adult: Secondary | ICD-10-CM | POA: Diagnosis not present

## 2020-01-20 DIAGNOSIS — C61 Malignant neoplasm of prostate: Secondary | ICD-10-CM | POA: Diagnosis not present

## 2020-01-20 DIAGNOSIS — K219 Gastro-esophageal reflux disease without esophagitis: Secondary | ICD-10-CM

## 2020-01-20 LAB — CMP14+EGFR
ALT: 20 IU/L (ref 0–44)
AST: 11 IU/L (ref 0–40)
Albumin/Globulin Ratio: 1.7 (ref 1.2–2.2)
Albumin: 4.2 g/dL (ref 3.7–4.7)
Alkaline Phosphatase: 108 IU/L (ref 44–121)
BUN/Creatinine Ratio: 13 (ref 10–24)
BUN: 18 mg/dL (ref 8–27)
Bilirubin Total: 0.3 mg/dL (ref 0.0–1.2)
CO2: 24 mmol/L (ref 20–29)
Calcium: 9.4 mg/dL (ref 8.6–10.2)
Chloride: 102 mmol/L (ref 96–106)
Creatinine, Ser: 1.4 mg/dL — ABNORMAL HIGH (ref 0.76–1.27)
GFR calc Af Amer: 57 mL/min/{1.73_m2} — ABNORMAL LOW (ref >59)
GFR calc non Af Amer: 49 mL/min/{1.73_m2} — ABNORMAL LOW (ref >59)
Globulin, Total: 2.5 g/dL (ref 1.5–4.5)
Glucose: 104 mg/dL — ABNORMAL HIGH (ref 65–99)
Potassium: 4.8 mmol/L (ref 3.5–5.2)
Sodium: 139 mmol/L (ref 134–144)
Total Protein: 6.7 g/dL (ref 6.0–8.5)

## 2020-01-20 LAB — CBC WITH DIFFERENTIAL/PLATELET
Basophils Absolute: 0.1 10*3/uL (ref 0.0–0.2)
Basos: 1 %
EOS (ABSOLUTE): 0.4 10*3/uL (ref 0.0–0.4)
Eos: 6 %
Hematocrit: 40.5 % (ref 37.5–51.0)
Hemoglobin: 13.2 g/dL (ref 13.0–17.7)
Immature Grans (Abs): 0 10*3/uL (ref 0.0–0.1)
Immature Granulocytes: 0 %
Lymphocytes Absolute: 1.2 10*3/uL (ref 0.7–3.1)
Lymphs: 18 %
MCH: 28.3 pg (ref 26.6–33.0)
MCHC: 32.6 g/dL (ref 31.5–35.7)
MCV: 87 fL (ref 79–97)
Monocytes Absolute: 0.5 10*3/uL (ref 0.1–0.9)
Monocytes: 8 %
Neutrophils Absolute: 4.6 10*3/uL (ref 1.4–7.0)
Neutrophils: 67 %
Platelets: 214 10*3/uL (ref 150–450)
RBC: 4.67 x10E6/uL (ref 4.14–5.80)
RDW: 13.3 % (ref 11.6–15.4)
WBC: 6.8 10*3/uL (ref 3.4–10.8)

## 2020-01-20 LAB — LIPID PANEL
Chol/HDL Ratio: 4 ratio (ref 0.0–5.0)
Cholesterol, Total: 119 mg/dL (ref 100–199)
HDL: 30 mg/dL — ABNORMAL LOW (ref >39)
LDL Chol Calc (NIH): 68 mg/dL (ref 0–99)
Triglycerides: 116 mg/dL (ref 0–149)
VLDL Cholesterol Cal: 21 mg/dL (ref 5–40)

## 2020-01-20 LAB — BAYER DCA HB A1C WAIVED: HB A1C (BAYER DCA - WAIVED): 6.3 % (ref <7.0)

## 2020-01-20 MED ORDER — LISINOPRIL 2.5 MG PO TABS: 2.5000 mg | ORAL_TABLET | Freq: Every day | ORAL | 1 refills | Status: DC

## 2020-01-20 MED ORDER — METOPROLOL SUCCINATE ER 50 MG PO TB24: 50.0000 mg | ORAL_TABLET | Freq: Every day | ORAL | 1 refills | Status: DC

## 2020-01-20 MED ORDER — MELOXICAM 15 MG PO TABS: 15.0000 mg | ORAL_TABLET | Freq: Every day | ORAL | 0 refills | Status: DC

## 2020-01-20 MED ORDER — METFORMIN HCL 1000 MG PO TABS: 1000.0000 mg | ORAL_TABLET | Freq: Two times a day (BID) | ORAL | 1 refills | Status: DC

## 2020-01-20 MED ORDER — OMEPRAZOLE 40 MG PO CPDR: 40.0000 mg | DELAYED_RELEASE_CAPSULE | Freq: Every day | ORAL | 1 refills | Status: DC

## 2020-01-20 MED ORDER — GLIPIZIDE ER 10 MG PO TB24: 10.0000 mg | ORAL_TABLET | Freq: Every day | ORAL | 1 refills | Status: DC

## 2020-01-20 NOTE — Patient Instructions (Signed)
Diabetes Mellitus and Foot Care Foot care is an important part of your health, especially when you have diabetes. Diabetes may cause you to have problems because of poor blood flow (circulation) to your feet and legs, which can cause your skin to:  Become thinner and drier.  Break more easily.  Heal more slowly.  Peel and crack. You may also have nerve damage (neuropathy) in your legs and feet, causing decreased feeling in them. This means that you may not notice minor injuries to your feet that could lead to more serious problems. Noticing and addressing any potential problems early is the best way to prevent future foot problems. How to care for your feet Foot hygiene  Wash your feet daily with warm water and mild soap. Do not use hot water. Then, pat your feet and the areas between your toes until they are completely dry. Do not soak your feet as this can dry your skin.  Trim your toenails straight across. Do not dig under them or around the cuticle. File the edges of your nails with an emery board or nail file.  Apply a moisturizing lotion or petroleum jelly to the skin on your feet and to dry, brittle toenails. Use lotion that does not contain alcohol and is unscented. Do not apply lotion between your toes. Shoes and socks  Wear clean socks or stockings every day. Make sure they are not too tight. Do not wear knee-high stockings since they may decrease blood flow to your legs.  Wear shoes that fit properly and have enough cushioning. Always look in your shoes before you put them on to be sure there are no objects inside.  To break in new shoes, wear them for just a few hours a day. This prevents injuries on your feet. Wounds, scrapes, corns, and calluses  Check your feet daily for blisters, cuts, bruises, sores, and redness. If you cannot see the bottom of your feet, use a mirror or ask someone for help.  Do not cut corns or calluses or try to remove them with medicine.  If you  find a minor scrape, cut, or break in the skin on your feet, keep it and the skin around it clean and dry. You may clean these areas with mild soap and water. Do not clean the area with peroxide, alcohol, or iodine.  If you have a wound, scrape, corn, or callus on your foot, look at it several times a day to make sure it is healing and not infected. Check for: ? Redness, swelling, or pain. ? Fluid or blood. ? Warmth. ? Pus or a bad smell. General instructions  Do not cross your legs. This may decrease blood flow to your feet.  Do not use heating pads or hot water bottles on your feet. They may burn your skin. If you have lost feeling in your feet or legs, you may not know this is happening until it is too late.  Protect your feet from hot and cold by wearing shoes, such as at the beach or on hot pavement.  Schedule a complete foot exam at least once a year (annually) or more often if you have foot problems. If you have foot problems, report any cuts, sores, or bruises to your health care provider immediately. Contact a health care provider if:  You have a medical condition that increases your risk of infection and you have any cuts, sores, or bruises on your feet.  You have an injury that is not   healing.  You have redness on your legs or feet.  You feel burning or tingling in your legs or feet.  You have pain or cramps in your legs and feet.  Your legs or feet are numb.  Your feet always feel cold.  You have pain around a toenail. Get help right away if:  You have a wound, scrape, corn, or callus on your foot and: ? You have pain, swelling, or redness that gets worse. ? You have fluid or blood coming from the wound, scrape, corn, or callus. ? Your wound, scrape, corn, or callus feels warm to the touch. ? You have pus or a bad smell coming from the wound, scrape, corn, or callus. ? You have a fever. ? You have a red line going up your leg. Summary  Check your feet every day  for cuts, sores, red spots, swelling, and blisters.  Moisturize feet and legs daily.  Wear shoes that fit properly and have enough cushioning.  If you have foot problems, report any cuts, sores, or bruises to your health care provider immediately.  Schedule a complete foot exam at least once a year (annually) or more often if you have foot problems. This information is not intended to replace advice given to you by your health care provider. Make sure you discuss any questions you have with your health care provider. Document Revised: 09/22/2018 Document Reviewed: 02/01/2016 Elsevier Patient Education  2020 Elsevier Inc.  

## 2020-01-20 NOTE — Progress Notes (Signed)
Subjective:    Patient ID: Jacob Rios, male    DOB: 1945-09-19, 75 y.o.   MRN: [id number]   Chief Complaint: medical management of chronic issues     HPI:  1. Mixed hyperlipidemia Does not watch diet and does no dedicated exercise. Lab Results  Component Value Date   CHOL 103 10/17/2019   HDL 31 (L) 10/17/2019   LDLCALC 55 10/17/2019   TRIG 86 10/17/2019   CHOLHDL 3.3 10/17/2019     2. Essential hypertension No c/o chest pain, sob or headache. does not check blood pressure at home. BP Readings from Last 3 Encounters:  01/20/20 135/75  11/22/19 114/68  11/11/19 131/77     3. Type 2 diabetes mellitus without complication, without long-term current use of insulin (HCC) Fasting blood sugars are running below 130. He denies any low blood sugars. Lab Results  Component Value Date   HGBA1C 6.3 01/20/2020     4. Gastroesophageal reflux disease without esophagitis Is on omeprazole daily and that works well for him.  5. CKD stage 3 due to type 2 diabetes mellitus ([place]) No problem voiding Lab Results  Component Value Date   CREATININE 1.60 (H) 12/19/2019     6. Malignant neoplasm of prostate [facility]) He has seed implant scheduled for Feb 25,2022  7. BMI 35.0-35.9,adult No recent weight changes Wt Readings from Last 3 Encounters:  01/20/20 237 lb (107.5 kg)  12/20/19 235 lb (106.6 kg)  11/22/19 229 lb (103.9 kg)   BMI Readings from Last 3 Encounters:  01/20/20 33.05 kg/m  12/20/19 32.78 kg/m  11/22/19 31.94 kg/m        Outpatient Encounter Medications as of 01/20/2020  Medication Sig  . aspirin 81 MG tablet Take 81 mg by mouth daily.  Marland Kitchen atorvastatin (LIPITOR) 40 MG tablet Take 1 tablet (40 mg total) by mouth daily.  Marland Kitchen glipiZIDE (GLUCOTROL XL) 10 MG 24 hr tablet Take 1 tablet (10 mg total) by mouth daily.  Marland Kitchen glucose blood (ONETOUCH VERIO) test strip Test 1X per day and as needed  Dx 250.02  . Lancets (ONETOUCH ULTRASOFT) lancets Patient test 1X per  day and prn  Dx 250.02  . lisinopril (ZESTRIL) 2.5 MG tablet Take 1 tablet by mouth once daily  . metFORMIN (GLUCOPHAGE) 1000 MG tablet Take 1 tablet (1,000 mg total) by mouth 2 (two) times daily with a meal.  . metoprolol succinate (TOPROL-XL) 50 MG 24 hr tablet Take 1 tablet by mouth once daily  . omeprazole (PRILOSEC) 40 MG capsule Take 1 capsule (40 mg total) by mouth daily.  . vitamin C (ASCORBIC ACID) 500 MG tablet Take 500 mg by mouth daily.     No facility-administered encounter medications on file as of 01/20/2020.    Past Surgical History:  Procedure Laterality Date  . APPENDECTOMY    . CARDIAC CATHETERIZATION  2005  . CORONARY ARTERY BYPASS GRAFT  2000   3 vessel  . HERNIA REPAIR  1974  . PROSTATE BIOPSY  06/2017   x 4-5     Family History  Problem Relation Age of Onset  . Lung cancer Mother   . Dementia Father   . Hyperlipidemia Father   . Congestive Heart Failure Father   . Post-traumatic stress disorder Son   . Alcohol abuse Maternal Grandfather   . Cancer Maternal Grandfather 90       stomach cancer   . Heart attack Neg Hx   . Stroke Neg Hx   . Breast  cancer Neg Hx   . Colon cancer Neg Hx   . Prostate cancer Neg Hx   . Pancreatic cancer Neg Hx     New complaints: He was trying to crank his chain saw at home 2 weeks and injured his wrist. He has been wearing a wrist brace and that has helped some.  Social history: Lives with wife  Controlled substance contract: n/a    Review of Systems  Constitutional: Negative for diaphoresis.  Eyes: Negative for pain.  Respiratory: Negative for shortness of breath.   Cardiovascular: Negative for chest pain, palpitations and leg swelling.  Gastrointestinal: Negative for abdominal pain.  Endocrine: Negative for polydipsia.  Skin: Negative for rash.  Neurological: Negative for dizziness, weakness and headaches.  Hematological: Does not bruise/bleed easily.  All other systems reviewed and are negative.       Objective:   Physical Exam Vitals and nursing note reviewed.  Constitutional:      Appearance: Normal appearance. He is well-developed and well-nourished.  HENT:     Head: Normocephalic.     Nose: Nose normal.     Mouth/Throat:     Mouth: Oropharynx is clear and moist.  Eyes:     Extraocular Movements: EOM normal.     Pupils: Pupils are equal, round, and reactive to light.  Neck:     Thyroid: No thyroid mass or thyromegaly.     Vascular: No carotid bruit or JVD.     Trachea: Phonation normal.  Cardiovascular:     Rate and Rhythm: Normal rate and regular rhythm.  Pulmonary:     Effort: Pulmonary effort is normal. No respiratory distress.     Breath sounds: Normal breath sounds.  Abdominal:     General: Bowel sounds are normal. Aorta is normal.     Palpations: Abdomen is soft.     Tenderness: There is no abdominal tenderness.  Musculoskeletal:        General: Normal range of motion.     Cervical back: Normal range of motion and neck supple.  Lymphadenopathy:     Cervical: No cervical adenopathy.  Skin:    General: Skin is warm and dry.  Neurological:     Mental Status: He is alert and oriented to person, place, and time.  Psychiatric:        Mood and Affect: Mood and affect normal.        Behavior: Behavior normal.        Thought Content: Thought content normal.        Judgment: Judgment normal.      BP 135/75   Pulse 77   Temp 97.8 F (36.6 C) (Temporal)   Resp 20   Ht 5' 11" (1.803 m)   Wt 237 lb (107.5 kg)   SpO2 92%   BMI 33.05 kg/m   Left wrist xray- no fracture visible hgab1c- 6.4%    Assessment & Plan:  Jacob Rios comes in today with chief complaint of Medical Management of Chronic Issues   Diagnosis and orders addressed:  1. Mixed hyperlipidemia Low fat diet - Lipid panel  2. Essential hypertension Low sodium diet - CMP14+EGFR - CBC with Differential/Platelet - lisinopril (ZESTRIL) 2.5 MG tablet; Take 1 tablet (2.5 mg total) by mouth  daily.  Dispense: 90 tablet; Refill: 1 - metoprolol succinate (TOPROL-XL) 50 MG 24 hr tablet; Take 1 tablet (50 mg total) by mouth daily. Take with or immediately following a meal.  Dispense: 90 tablet; Refill: 1  3. Type  2 diabetes mellitus without complication, without long-term current use of insulin (HCC) Continue to watch carbs in diet - Bayer DCA Hb A1c Waived - Microalbumin / creatinine urine ratio - glipiZIDE (GLUCOTROL XL) 10 MG 24 hr tablet; Take 1 tablet (10 mg total) by mouth daily.  Dispense: 90 tablet; Refill: 1 - metFORMIN (GLUCOPHAGE) 1000 MG tablet; Take 1 tablet (1,000 mg total) by mouth 2 (two) times daily with a meal.  Dispense: 180 tablet; Refill: 1  4. Gastroesophageal reflux disease without esophagitis Avoid spicy foods Do not eat 2 hours prior to bedtime - omeprazole (PRILOSEC) 40 MG capsule; Take 1 capsule (40 mg total) by mouth daily.  Dispense: 90 capsule; Refill: 1  5. CKD stage 3 due to type 2 diabetes mellitus ([place]) Labs ppending  6. Malignant neoplasm of prostate [facility]) Keep follow up with urology  7. BMI 35.0-35.9,adult Discussed diet and exercise for person with BMI >25 Will recheck weight in 3-6 months  8. Acute pain of left wrist Wear brace - DG Wrist Complete Left   Labs pending Health Maintenance reviewed Diet and exercise encouraged  Follow up plan: 3 months   [name], FNP

## 2020-01-21 LAB — MICROALBUMIN / CREATININE URINE RATIO
Creatinine, Urine: 52.6 mg/dL
Microalb/Creat Ratio: <6 mg/g creat (ref 0–29)
Microalbumin, Urine: <3 ug/mL

## 2020-01-27 ENCOUNTER — Ambulatory Visit (INDEPENDENT_AMBULATORY_CARE_PROVIDER_SITE_OTHER): Payer: Medicare PPO | Admitting: Family

## 2020-01-27 ENCOUNTER — Encounter: Payer: Self-pay | Admitting: Family

## 2020-01-27 DIAGNOSIS — K219 Gastro-esophageal reflux disease without esophagitis: Secondary | ICD-10-CM | POA: Diagnosis not present

## 2020-01-27 MED ORDER — PANTOPRAZOLE SODIUM 40 MG PO TBEC: 40.0000 mg | DELAYED_RELEASE_TABLET | Freq: Every day | ORAL | 3 refills | Status: DC

## 2020-01-27 NOTE — Progress Notes (Signed)
   Virtual Visit via telephone Note Due to COVID-19 pandemic this visit was conducted virtually. This visit type was conducted due to national recommendations for restrictions regarding the COVID-19 Pandemic (e.g. social distancing, sheltering in place) in an effort to limit this patient's exposure and mitigate transmission in our community. All issues noted in this document were discussed and addressed.  A physical exam was not performed with this format.  I connected with Jacob Rios on 01/27/20 at 2:07 pm  by telephone and verified that I am speaking with the correct person using two identifiers. Jacob Rios is currently located at [facility] and  is currently with no one during visit. The provider, Evelina Dun, FNP is located in their office at time of visit.  I discussed the limitations, risks, security and privacy concerns of performing an evaluation and management service by telephone and the availability of in person appointments. I also discussed with the patient that there may be a patient responsible charge related to this service. The patient expressed understanding and agreed to proceed.   History and Present Illness:  Gastroesophageal Reflux He complains of belching and heartburn. This is a chronic problem. The current episode started more than 1 year ago. The problem occurs occasionally. The problem has been waxing and waning. The symptoms are aggravated by certain foods. Risk factors include obesity. He has tried a PPI for the symptoms. The treatment provided mild relief.      Review of Systems  Gastrointestinal: Positive for heartburn.     Observations/Objective: NO SOB or distress noted   Assessment and Plan: 1. Gastroesophageal reflux disease, unspecified whether esophagitis present Will stop Prilosec 40 mg today and start Protonix 40 mg  -Diet discussed- Avoid fried, spicy, citrus foods, caffeine and alcohol -Do not eat 2-3 hours before bedtime -Encouraged small  frequent meals -Avoid NSAID's -RTO if symptoms worsen or do not improve   - pantoprazole (PROTONIX) 40 MG tablet; Take 1 tablet (40 mg total) by mouth daily.  Dispense: 90 tablet; Refill: 3   I discussed the assessment and treatment plan with the patient. The patient was provided an opportunity to ask questions and all were answered. The patient agreed with the plan and demonstrated an understanding of the instructions.   The patient was advised to call back or seek an in-person evaluation if the symptoms worsen or if the condition fails to improve as anticipated.  The above assessment and management plan was discussed with the patient. The patient verbalized understanding of and has agreed to the management plan. Patient is aware to call the clinic if symptoms persist or worsen. Patient is aware when to return to the clinic for a follow-up visit. Patient educated on when it is appropriate to go to the emergency department.   Time call ended:  2:18 pm  I provided 11 minutes of non-face-to-face time during this encounter.    Evelina Dun, FNP

## 2020-02-01 ENCOUNTER — Telehealth: Payer: Self-pay | Admitting: *Deleted

## 2020-02-01 NOTE — Telephone Encounter (Signed)
Called patient to remind of pre-seed appts. for 02-02-20, lvm for a return call 

## 2020-02-02 ENCOUNTER — Ambulatory Visit
Admission: RE | Admit: 2020-02-02 | Discharge: 2020-02-02 | Disposition: A | Discharge status: Home / Self Care | Payer: Medicare PPO | Source: Ambulatory Visit | Attending: Radiation Oncology | Admitting: Radiation Oncology

## 2020-02-02 ENCOUNTER — Ambulatory Visit (HOSPITAL_COMMUNITY)
Admission: RE | Admit: 2020-02-02 | Discharge: 2020-02-02 | Disposition: A | Discharge status: Home / Self Care | Payer: Medicare PPO | Source: Ambulatory Visit | Attending: Urology | Admitting: Urology

## 2020-02-02 ENCOUNTER — Other Ambulatory Visit: Payer: Self-pay

## 2020-02-02 ENCOUNTER — Ambulatory Visit
Admission: RE | Admit: 2020-02-02 | Discharge: 2020-02-02 | Disposition: A | Discharge status: Home / Self Care | Payer: Medicare PPO | Source: Ambulatory Visit | Attending: Urology | Admitting: Urology

## 2020-02-02 ENCOUNTER — Encounter (HOSPITAL_COMMUNITY)
Admission: RE | Admit: 2020-02-02 | Discharge: 2020-02-02 | Disposition: A | Discharge status: Home / Self Care | Payer: Medicare PPO | Source: Ambulatory Visit | Attending: Urology | Admitting: Urology

## 2020-02-02 DIAGNOSIS — C61 Malignant neoplasm of prostate: Secondary | ICD-10-CM

## 2020-02-02 DIAGNOSIS — Z01818 Encounter for other preprocedural examination: Secondary | ICD-10-CM | POA: Diagnosis not present

## 2020-02-02 NOTE — Progress Notes (Signed)
°  Radiation Oncology         [phone]) [phone] ________________________________  Name: Jacob Rios MRN: [id number]  Date: 02/02/2020  DOB: [birth year]  SIMULATION AND TREATMENT PLANNING NOTE PUBIC ARCH STUDY  SW:NIOEVO, [name], FNP  [name], MD  DIAGNOSIS: 75 y.o. gentleman with Stage T2a adenocarcinoma of the prostate with Gleason score of 3+4, and PSA of 13.3.  Oncology History  Malignant neoplasm of prostate ([place])  11/11/2019 Cancer Staging   Staging form: Prostate, AJCC 8th Edition - Clinical stage from 11/11/2019: Stage IIB (cT2a, cN0, cM0, PSA: 13.3, Grade Group: 2) - Signed by Freeman Caldron, PA-C on 12/20/2019   12/20/2019 Initial Diagnosis   Malignant neoplasm of prostate ([place])       ICD-10-CM   1. Malignant neoplasm of prostate ([place])  C61     COMPLEX SIMULATION:  The patient presented today for evaluation for possible prostate seed implant. He was brought to the radiation planning suite and placed supine on the CT couch. A 3-dimensional image study set was obtained in upload to the planning computer. There, on each axial slice, I contoured the prostate gland. Then, using three-dimensional radiation planning tools I reconstructed the prostate in view of the structures from the transperineal needle pathway to assess for possible pubic arch interference. In doing so, I did not appreciate any pubic arch interference. Also, the patient's prostate volume was estimated based on the drawn structure. The volume was 35 cc.  Given the pubic arch appearance and prostate volume, patient remains a good candidate to proceed with prostate seed implant. Today, he freely provided informed written consent to proceed.    PLAN: The patient will undergo prostate seed implant.   ________________________________  [name]. Tammi Klippel, M.D.

## 2020-02-08 ENCOUNTER — Other Ambulatory Visit: Payer: Self-pay

## 2020-02-08 ENCOUNTER — Ambulatory Visit: Payer: Medicare PPO | Admitting: Physician Assistant

## 2020-02-08 ENCOUNTER — Encounter: Payer: Self-pay | Admitting: Physician Assistant

## 2020-02-08 VITALS — BP 144/80 | HR 72 | Ht 71.0 in | Wt 237.2 lb

## 2020-02-08 DIAGNOSIS — I739 Peripheral vascular disease, unspecified: Secondary | ICD-10-CM | POA: Diagnosis not present

## 2020-02-08 DIAGNOSIS — I714 Abdominal aortic aneurysm, without rupture, unspecified: Secondary | ICD-10-CM

## 2020-02-08 DIAGNOSIS — E782 Mixed hyperlipidemia: Secondary | ICD-10-CM | POA: Diagnosis not present

## 2020-02-08 DIAGNOSIS — I2510 Atherosclerotic heart disease of native coronary artery without angina pectoris: Secondary | ICD-10-CM | POA: Diagnosis not present

## 2020-02-08 DIAGNOSIS — I771 Stricture of artery: Secondary | ICD-10-CM

## 2020-02-08 DIAGNOSIS — I10 Essential (primary) hypertension: Secondary | ICD-10-CM

## 2020-02-08 NOTE — Progress Notes (Signed)
Cardiology Office Note:    Date:  02/08/2020   ID:  Jacob Rios, DOB 10-29-1945, MRN [id number]  PCP:  [name], [facility] Cardiologist:  Lauree Chandler, MD  [place] Electrophysiologist:  None  PV: Dr. Fletcher Anon   Chief Complaint: yearly follow up   History of Present Illness:    Jacob Rios is a 75 y.o. male with a hx of CAD s/p CABG 2000, HTN, HLD, DM, prostate cancer, PAD and former tobacco seen for follow up.   In 2000 he had an MI with subsequent bypass surgery (last cath 2005 with occluded LIMA to LAD-native LAD open, patent SVG to diagonal and patent RIMA to RCA). Stress myoview 12/01/12 with no ischemia.   Given his risk factors and previous tobacco use.  He underwent screening for abdominal aortic aneurysm 03/2019. There was mild ectasia of the abdominal aorta at 2.9 cm.  In addition, there was evidence of significant left common iliac artery stenosis with peak velocity of 398. Seen by Dr. Donalda Ewings 04/2019. No claudication symptoms and normal distal pulses. Recommended yearly screen test.   Here today for follow up.  He is scheduled for prostate seed implant next month.  Patient is very active at baseline.  He is outside most of the days and walks up and down of hills.  He denies chest pain, shortness of breath, orthopnea, PND, syncope, lower extremity edema or melena.  Blood pressure minimally elevated during office visit, patient reports it always high at Dr. Gabriel Carina.  He reports blood pressure of 130/70 at home.  Never above 297 systolically.  Past Medical History:  Diagnosis Date  . Coronary artery disease    3V CABG 2000. Last heart cath 2005, with 2 patent grafts  . Diabetes mellitus   . Hyperlipidemia   . Hypertension   . Prostate cancer [facility])     Past Surgical History:  Procedure Laterality Date  . APPENDECTOMY    . CARDIAC CATHETERIZATION  2005  . CORONARY ARTERY BYPASS GRAFT  2000   3 vessel  . HERNIA REPAIR  1974  . PROSTATE  BIOPSY  06/2017   x 4-5     Current Medications: Current Meds  Medication Sig  . aspirin 81 MG tablet Take 81 mg by mouth daily.  Marland Kitchen atorvastatin (LIPITOR) 40 MG tablet Take 1 tablet (40 mg total) by mouth daily.  . Cholecalciferol (D3 ADULT PO) Take 1 tablet by mouth daily.  Marland Kitchen glipiZIDE (GLUCOTROL XL) 10 MG 24 hr tablet Take 1 tablet (10 mg total) by mouth daily.  Marland Kitchen glucose blood (ONETOUCH VERIO) test strip Test 1X per day and as needed  Dx 250.02  . Lancets (ONETOUCH ULTRASOFT) lancets Patient test 1X per day and prn  Dx 250.02  . lisinopril (ZESTRIL) 2.5 MG tablet Take 1 tablet (2.5 mg total) by mouth daily.  . metFORMIN (GLUCOPHAGE) 1000 MG tablet Take 1 tablet (1,000 mg total) by mouth 2 (two) times daily with a meal.  . metoprolol succinate (TOPROL-XL) 50 MG 24 hr tablet Take 1 tablet (50 mg total) by mouth daily. Take with or immediately following a meal.  . pantoprazole (PROTONIX) 40 MG tablet Take 1 tablet (40 mg total) by mouth daily.  . vitamin C (ASCORBIC ACID) 500 MG tablet Take 500 mg by mouth daily.     Allergies:   Morphine   Social History   Socioeconomic History  . Marital status: Married    Spouse name: Vaughan Basta   . Number  of children: 3  . Years of education: Not on file  . Highest education level: Not on file  Occupational History  . Occupation: part-time Pharmacist, community    Comment: retired   Tobacco Use  . Smoking status: Former Smoker    Packs/day: 4.00    Years: 45.00    Pack years: 180.00    Types: Cigarettes    Quit date: 01/13/1997    Years since quitting: 23.0  . Smokeless tobacco: Never Used  . Tobacco comment: reports he began smoking at age 57  Vaping Use  . Vaping Use: Never used  Substance and Sexual Activity  . Alcohol use: No  . Drug use: No  . Sexual activity: Not Currently  Other Topics Concern  . Not on file  Social History Narrative  . Not on file   Social Determinants of Health   Financial Resource Strain: Not on file  Food Insecurity:  Not on file  Transportation Needs: Not on file  Physical Activity: Not on file  Stress: Not on file  Social Connections: Not on file     Family History: The patient's family history includes Alcohol abuse in his maternal grandfather; Cancer (age of onset: 64) in his maternal grandfather; Congestive Heart Failure in his father; Dementia in his father; Hyperlipidemia in his father; Lung cancer in his mother; Post-traumatic stress disorder in his son. There is no history of Heart attack, Stroke, Breast cancer, Colon cancer, Prostate cancer, or Pancreatic cancer.    ROS:   Please see the history of present illness.    All other systems reviewed and are negative.   EKGs/Labs/Other Studies Reviewed:    The following studies were reviewed today:  Echo 03/2019 1. Left ventricular ejection fraction, by estimation, is 50 to 55%. The  left ventricle has low normal function. The left ventricle has no regional  wall motion abnormalities. Left ventricular diastolic parameters are  consistent with Grade I diastolic  dysfunction (impaired relaxation).  2. Right ventricular systolic function is normal. The right ventricular  size is normal. There is normal pulmonary artery systolic pressure.  3. The mitral valve is normal in structure. Mild mitral valve  regurgitation. No evidence of mitral stenosis.  4. The aortic valve is tricuspid. Aortic valve regurgitation is not  visualized. No aortic stenosis is present.  5. The inferior vena cava is normal in size with greater than 50%  respiratory variability, suggesting right atrial pressure of 3 mmHg.   Aorta US 03/2019 Summary:  Abdominal Aorta: The largest aortic measurement is 2.9 cm. Dilatation of  the mid to distal abdominal aorta measuing 2.9 cm x 2.9 cm, extending 3.2  cm in length. No previous exam available for comparison.  Stenosis: +-----------------+-------------+  Location     Stenosis     +-----------------+-------------+   Left Common Iliac>50% stenosis  +-----------------+-------------+  EKG:  EKG is not  ordered today.  The ekg done 02/02/20  demonstrates NSR  Recent Labs: 01/20/2020: ALT 20; BUN 18; Creatinine, Ser 1.40; Hemoglobin 13.2; Platelets 214; Potassium 4.8; Sodium 139  Recent Lipid Panel    Component Value Date/Time   CHOL 119 01/20/2020 0829   CHOL 107 07/30/2012 1158   TRIG 116 01/20/2020 0829   TRIG 96 07/30/2012 1158   HDL 30 (L) 01/20/2020 0829   HDL 24 (L) 07/30/2012 1158   CHOLHDL 4.0 01/20/2020 0829   LDLCALC 68 01/20/2020 0829   LDLCALC 64 07/30/2012 1158    Physical Exam:    VS:  BP Marland Kitchen)  144/80   Pulse 72   Ht 5\' 11"  (1.803 m)   Wt 237 lb 3.2 oz (107.6 kg)   BMI 33.08 kg/m     Wt Readings from Last 3 Encounters:  02/08/20 237 lb 3.2 oz (107.6 kg)  01/20/20 237 lb (107.5 kg)  12/20/19 235 lb (106.6 kg)     GEN:  Well nourished, well developed in no acute distress HEENT: Normal NECK: No JVD; No carotid bruits LYMPHATICS: No lymphadenopathy CARDIAC: RRR, no murmurs, rubs, gallops RESPIRATORY:  Clear to auscultation without rales, wheezing or rhonchi  ABDOMEN: Soft, non-tender, non-distended MUSCULOSKELETAL:  No edema; No deformity  SKIN: Warm and dry NEUROLOGIC:  Alert and oriented x 3 PSYCHIATRIC:  Normal affect   ASSESSMENT AND PLAN:    1. CAD No angina.  Continue aspirin, statin and beta-blocker.  2.  Hypertension Blood pressure minimally.  However reports normal at home.  I have discussed strict blood pressure control given history of abdominal aortic aneurysm.  Recommended low-sodium diet.  Recommended blood pressure goal of less than 130/80.  He will keep track of his blood pressure and let us know if elevated.  3,.  Peripheral arterial disease -History of abdominal aortic aneurysm and iliac artery stenosis. -As summarized above. -No claudication symptoms. -Continue statin. -Scheduled for study next month or so.  4.  Hyperlipidemia - 01/20/2020:  Cholesterol, Total 119; HDL 30; LDL Chol Calc (NIH) 68; Triglycerides 116  -LDL at goal less than 70 -Continue Lipitor 40 mg daily  5.  Possible surgical clearance -Patient is scheduled for seed implant for prostate cancer next month.  He is easily getting greater than 4 METS of activity without cardiac symptoms.  He is very active at baseline.  Recent EKG without ischemic changes.  He will be cleared at acceptable risk.  Will need to ask MD if required holding aspirin.  Medication Adjustments/Labs and Tests Ordered: Current medicines are reviewed at length with the patient today.  Concerns regarding medicines are outlined above.  No orders of the defined types were placed in this encounter.  No orders of the defined types were placed in this encounter.   Patient Instructions  Medication Instructions:  Your physician recommends that you continue on your current medications as directed. Please refer to the Current Medication list given to you today.  *If you need a refill on your cardiac medications before your next appointment, please call your pharmacy*   Lab Work: None ordered  If you have labs (blood work) drawn today and your tests are completely normal, you will receive your results only by: Marland Kitchen MyChart Message (if you have MyChart) OR . A paper copy in the mail If you have any lab test that is abnormal or we need to change your treatment, we will call you to review the results.   Testing/Procedures: None ordered   Follow-Up: At [facility], you and your health needs are our priority.  As part of our continuing mission to provide you with exceptional heart care, we have created designated Provider Care Teams.  These Care Teams include your primary Cardiologist (physician) and Advanced Practice Providers (APPs -  Physician Assistants and Nurse Practitioners) who all work together to provide you with the care you need, when you need it.  We recommend signing up for the  patient portal called "MyChart".  Sign up information is provided on this After Visit Summary.  MyChart is used to connect with patients for Virtual Visits (Telemedicine).  Patients are able to  view lab/test results, encounter notes, upcoming appointments, etc.  Non-urgent messages can be sent to your provider as well.   To learn more about what you can do with MyChart, go to [email or web address].    Your next appointment:   12 month(s)  The format for your next appointment:   In Person  Provider:   You may see Lauree Chandler, MD or one of the following Advanced Practice Providers on your designated Care Team:    Melina Copa, PA-C  Ermalinda Barrios, PA-C    Other Instructions      Signed, Leanor Kail, [place]  02/08/2020 9:32 AM    [place]

## 2020-02-08 NOTE — Patient Instructions (Signed)
Medication Instructions:  Your physician recommends that you continue on your current medications as directed. Please refer to the Current Medication list given to you today.  *If you need a refill on your cardiac medications before your next appointment, please call your pharmacy*   Lab Work: None ordered  If you have labs (blood work) drawn today and your tests are completely normal, you will receive your results only by: . MyChart Message (if you have MyChart) OR . A paper copy in the mail If you have any lab test that is abnormal or we need to change your treatment, we will call you to review the results.   Testing/Procedures: None ordered   Follow-Up: At [facility], you and your health needs are our priority.  As part of our continuing mission to provide you with exceptional heart care, we have created designated Provider Care Teams.  These Care Teams include your primary Cardiologist (physician) and Advanced Practice Providers (APPs -  Physician Assistants and Nurse Practitioners) who all work together to provide you with the care you need, when you need it.  We recommend signing up for the patient portal called "MyChart".  Sign up information is provided on this After Visit Summary.  MyChart is used to connect with patients for Virtual Visits (Telemedicine).  Patients are able to view lab/test results, encounter notes, upcoming appointments, etc.  Non-urgent messages can be sent to your provider as well.   To learn more about what you can do with MyChart, go to [email or web address].    Your next appointment:   12 month(s)  The format for your next appointment:   In Person  Provider:   You may see Christopher McAlhany, MD or one of the following Advanced Practice Providers on your designated Care Team:    Dayna Dunn, PA-C  Michele Lenze, PA-C    Other Instructions   

## 2020-02-14 ENCOUNTER — Telehealth: Payer: Self-pay

## 2020-02-14 ENCOUNTER — Telehealth: Payer: Self-pay | Admitting: *Deleted

## 2020-02-14 DIAGNOSIS — K219 Gastro-esophageal reflux disease without esophagitis: Secondary | ICD-10-CM

## 2020-02-14 MED ORDER — OMEPRAZOLE 40 MG PO CPDR: 40.0000 mg | DELAYED_RELEASE_CAPSULE | Freq: Every day | ORAL | 1 refills | Status: DC

## 2020-02-14 NOTE — Telephone Encounter (Signed)
Fax from [place] Note from pharmacy: Omeprazole DR 40 mg cap #90 1 QD Not covered; try to do PA please [phone]

## 2020-02-14 NOTE — Telephone Encounter (Signed)
This was changed to Protonix 40 on 01/27/20. I will call WM

## 2020-02-14 NOTE — Telephone Encounter (Signed)
Pt wife called - they do not want to switch from omeprazole to Protonix - I have fixed this in Epic and made WM aware  Protonix deactivated.

## 2020-02-15 NOTE — Telephone Encounter (Signed)
There is an existing case within the [facility] environment that has the same patient, prescriber, and drug. This case must be finalized before proceeding with similar requests.   Will close out - pt picked up med yesterday - too soon to try and fill already   Can attempt PA next month when next RF is due

## 2020-02-15 NOTE — Telephone Encounter (Signed)
Yesterday - per pt preference (in another note) we cancelled the protonix and resent the omeprazole 40    Will start PA for OMEPRAZOLE $)mg DR CAP now Key: [id number] Sent to plan

## 2020-02-19 ENCOUNTER — Other Ambulatory Visit: Payer: Self-pay | Admitting: Nurse Practitioner

## 2020-02-19 DIAGNOSIS — E782 Mixed hyperlipidemia: Secondary | ICD-10-CM

## 2020-02-21 DIAGNOSIS — C61 Malignant neoplasm of prostate: Secondary | ICD-10-CM | POA: Diagnosis not present

## 2020-02-27 NOTE — Telephone Encounter (Signed)
[employer] form filled out - placed on MMM desk to sign and return to [facility].  Once back it will be faxed.

## 2020-02-29 NOTE — Telephone Encounter (Signed)
Omeprazole outcome = DENIED (did not get info back from provider in time)

## 2020-03-01 ENCOUNTER — Telehealth: Payer: Self-pay | Admitting: *Deleted

## 2020-03-01 NOTE — Telephone Encounter (Signed)
CALLED PATIENT TO REMIND OF LAB AND COVID TESTING FOR 03-06-20, SPOKE WITH PATIENT AND HE IS AWARE OF THESE APPTS.

## 2020-03-06 ENCOUNTER — Encounter (HOSPITAL_COMMUNITY)
Admission: RE | Admit: 2020-03-06 | Discharge: 2020-03-06 | Disposition: A | Discharge status: Home / Self Care | Payer: Medicare PPO | Source: Ambulatory Visit | Attending: Urology | Admitting: Urology

## 2020-03-06 ENCOUNTER — Other Ambulatory Visit: Payer: Self-pay

## 2020-03-06 ENCOUNTER — Encounter (HOSPITAL_BASED_OUTPATIENT_CLINIC_OR_DEPARTMENT_OTHER): Payer: Self-pay | Admitting: Urology

## 2020-03-06 ENCOUNTER — Other Ambulatory Visit (HOSPITAL_COMMUNITY)
Admission: RE | Admit: 2020-03-06 | Discharge: 2020-03-06 | Disposition: A | Discharge status: Home / Self Care | Payer: Medicare PPO | Source: Ambulatory Visit | Attending: Urology | Admitting: Urology

## 2020-03-06 DIAGNOSIS — Z20822 Contact with and (suspected) exposure to covid-19: Secondary | ICD-10-CM | POA: Insufficient documentation

## 2020-03-06 DIAGNOSIS — Z01812 Encounter for preprocedural laboratory examination: Secondary | ICD-10-CM | POA: Insufficient documentation

## 2020-03-06 LAB — COMPREHENSIVE METABOLIC PANEL
ALT: 18 U/L (ref 0–44)
AST: 15 U/L (ref 15–41)
Albumin: 4.1 g/dL (ref 3.5–5.0)
Alkaline Phosphatase: 77 U/L (ref 38–126)
Anion gap: 10 (ref 5–15)
BUN: 18 mg/dL (ref 8–23)
CO2: 27 mmol/L (ref 22–32)
Calcium: 9.4 mg/dL (ref 8.9–10.3)
Chloride: 103 mmol/L (ref 98–111)
Creatinine, Ser: 1.53 mg/dL — ABNORMAL HIGH (ref 0.61–1.24)
GFR, Estimated: 48 mL/min — ABNORMAL LOW (ref >60)
Glucose, Bld: 153 mg/dL — ABNORMAL HIGH (ref 70–99)
Potassium: 4.3 mmol/L (ref 3.5–5.1)
Sodium: 140 mmol/L (ref 135–145)
Total Bilirubin: 0.8 mg/dL (ref 0.3–1.2)
Total Protein: 7.2 g/dL (ref 6.5–8.1)

## 2020-03-06 LAB — PROTIME-INR
INR: 1 (ref 0.8–1.2)
Prothrombin Time: 12.8 seconds (ref 11.4–15.2)

## 2020-03-06 LAB — CBC
HCT: 41.3 % (ref 39.0–52.0)
Hemoglobin: 13.7 g/dL (ref 13.0–17.0)
MCH: 29.3 pg (ref 26.0–34.0)
MCHC: 33.2 g/dL (ref 30.0–36.0)
MCV: 88.2 fL (ref 80.0–100.0)
Platelets: 161 10*3/uL (ref 150–400)
RBC: 4.68 MIL/uL (ref 4.22–5.81)
RDW: 14.1 % (ref 11.5–15.5)
WBC: 6.4 10*3/uL (ref 4.0–10.5)
nRBC: 0 % (ref 0.0–0.2)

## 2020-03-06 LAB — APTT: aPTT: 30 seconds (ref 24–36)

## 2020-03-06 LAB — SARS CORONAVIRUS 2 (TAT 6-24 HRS): SARS Coronavirus 2: NEGATIVE

## 2020-03-06 NOTE — Progress Notes (Signed)
Spoke w/ via phone for pre-op interview--- Pt and pt wife Lab needs dos---- no               Lab results------ pt had CBC, CMP, PT/ PTT done 03-06-2020 result in epic;  Current ekg/ cxr in epic/ chart COVID test ------ done 03-06-2020 result in epic Arrive at ------- 0530 on 03-09-2020 NPO after MN NO Solid Food.  Clear liquids from MN until--- 0430 Medications to take morning of surgery ----- Toprol, Prilosec Diabetic medication ----- do not take glipizide/ metformin morning of surgery Patient Special Instructions ----- to do one fleet enema morning of surgery Pre-Op special Istructions ----- n/a Patient verbalized understanding of instructions that were given at this phone interview. Patient denies shortness of breath, chest pain, fever, cough at this phone interview.   Anesthesia :  HTN;  PAD;  CAD s/p PCI and stent to [place],  MI and s/p CABG 2000;  DM2;  CKD3. Pt denies cardiac s&s, sob, and no peripheral swelling.  Per last cardiology note pt > 4 METS.    PCP:  [name] NP (lov 01-20-2020 epic) Cardiologist :  DR Angelena Form (lov 02-08-2020 epic) Chest x-ray :  02-02-2020 epic EKG :  02-02-2020 Echo :  03-30-2019 epic Stress test:  12-02-2012 epic Cardiac Cath :   04-21-2003 epic Activity level:  See above Sleep Study/ CPAP :  NO Fasting Blood Sugar : 110--130     / Checks Blood Sugar -- times a day: daily in am Blood Thinner/ Instructions Maryjane Hurter Dose: NO ASA / Instructions/ Last Dose : ASA 81mg 

## 2020-03-07 NOTE — Telephone Encounter (Signed)
PA approved from 01/14/20-01/12/2021  Pharmacy and patient aware.

## 2020-03-08 ENCOUNTER — Telehealth: Payer: Self-pay | Admitting: *Deleted

## 2020-03-08 NOTE — Telephone Encounter (Signed)
CALLED PATIENT'S WIFE- LINDA TO REMIND OF IMPLANT FOR 03-09-20, LVM FOR A RETURN CALL

## 2020-03-09 ENCOUNTER — Ambulatory Visit (HOSPITAL_BASED_OUTPATIENT_CLINIC_OR_DEPARTMENT_OTHER): Payer: Medicare PPO | Admitting: Anesthesiology

## 2020-03-09 ENCOUNTER — Ambulatory Visit (HOSPITAL_COMMUNITY): Payer: Medicare PPO

## 2020-03-09 ENCOUNTER — Encounter (HOSPITAL_BASED_OUTPATIENT_CLINIC_OR_DEPARTMENT_OTHER)
Admission: RE | Disposition: A | Discharge status: Home / Self Care | Payer: Self-pay | Source: Ambulatory Visit | Attending: Urology

## 2020-03-09 ENCOUNTER — Other Ambulatory Visit: Payer: Self-pay

## 2020-03-09 ENCOUNTER — Ambulatory Visit (HOSPITAL_BASED_OUTPATIENT_CLINIC_OR_DEPARTMENT_OTHER)
Admission: RE | Admit: 2020-03-09 | Discharge: 2020-03-09 | Disposition: A | Discharge status: Home / Self Care | Payer: Medicare PPO | Source: Ambulatory Visit | Attending: Urology | Admitting: Urology

## 2020-03-09 ENCOUNTER — Encounter (HOSPITAL_BASED_OUTPATIENT_CLINIC_OR_DEPARTMENT_OTHER): Payer: Self-pay | Admitting: Urology

## 2020-03-09 DIAGNOSIS — Z7984 Long term (current) use of oral hypoglycemic drugs: Secondary | ICD-10-CM | POA: Insufficient documentation

## 2020-03-09 DIAGNOSIS — Z7982 Long term (current) use of aspirin: Secondary | ICD-10-CM | POA: Insufficient documentation

## 2020-03-09 DIAGNOSIS — Z87891 Personal history of nicotine dependence: Secondary | ICD-10-CM | POA: Diagnosis not present

## 2020-03-09 DIAGNOSIS — C61 Malignant neoplasm of prostate: Secondary | ICD-10-CM | POA: Diagnosis not present

## 2020-03-09 DIAGNOSIS — Z79899 Other long term (current) drug therapy: Secondary | ICD-10-CM | POA: Diagnosis not present

## 2020-03-09 DIAGNOSIS — N183 Chronic kidney disease, stage 3 unspecified: Secondary | ICD-10-CM | POA: Diagnosis not present

## 2020-03-09 DIAGNOSIS — E1122 Type 2 diabetes mellitus with diabetic chronic kidney disease: Secondary | ICD-10-CM | POA: Diagnosis not present

## 2020-03-09 DIAGNOSIS — I129 Hypertensive chronic kidney disease with stage 1 through stage 4 chronic kidney disease, or unspecified chronic kidney disease: Secondary | ICD-10-CM | POA: Diagnosis not present

## 2020-03-09 HISTORY — PX: SPACE OAR INSTILLATION: SHX6769

## 2020-03-09 HISTORY — DX: Diaphragmatic hernia without obstruction or gangrene: K44.9

## 2020-03-09 HISTORY — PX: CYSTOSCOPY: SHX5120

## 2020-03-09 HISTORY — DX: Nocturia: R35.1

## 2020-03-09 HISTORY — DX: Peripheral vascular disease, unspecified: I73.9

## 2020-03-09 HISTORY — DX: Mixed hyperlipidemia: E78.2

## 2020-03-09 HISTORY — DX: Complete loss of teeth, unspecified cause, unspecified class: K08.109

## 2020-03-09 HISTORY — DX: Type 2 diabetes mellitus without complications: E11.9

## 2020-03-09 HISTORY — PX: RADIOACTIVE SEED IMPLANT: SHX5150

## 2020-03-09 HISTORY — DX: Chronic kidney disease, stage 3 unspecified: N18.30

## 2020-03-09 LAB — GLUCOSE, CAPILLARY
Glucose-Capillary: 119 mg/dL — ABNORMAL HIGH (ref 70–99)
Glucose-Capillary: 131 mg/dL — ABNORMAL HIGH (ref 70–99)

## 2020-03-09 SURGERY — INSERTION, RADIATION SOURCE, PROSTATE
Anesthesia: General | Site: Rectum

## 2020-03-09 MED ORDER — OXYCODONE HCL 5 MG PO TABS: 5.0000 mg | ORAL_TABLET | Freq: Once | ORAL | Status: DC | PRN

## 2020-03-09 MED ORDER — SODIUM CHLORIDE 0.9 % IV SOLN: 250.0000 mL | INTRAVENOUS | Status: DC | PRN

## 2020-03-09 MED ORDER — SODIUM CHLORIDE 0.9 % IV SOLN: INTRAVENOUS | Status: DC

## 2020-03-09 MED ORDER — FENTANYL CITRATE (PF) 100 MCG/2ML IJ SOLN
INTRAMUSCULAR | Status: AC
  Filled 2020-03-09: qty 2

## 2020-03-09 MED ORDER — PROPOFOL 10 MG/ML IV BOLUS
INTRAVENOUS | Status: AC
  Filled 2020-03-09: qty 20

## 2020-03-09 MED ORDER — SODIUM CHLORIDE 0.9% FLUSH: 3.0000 mL | Freq: Two times a day (BID) | INTRAVENOUS | Status: DC

## 2020-03-09 MED ORDER — CIPROFLOXACIN IN D5W 400 MG/200ML IV SOLN
INTRAVENOUS | Status: AC
  Filled 2020-03-09: qty 200

## 2020-03-09 MED ORDER — OXYCODONE HCL 5 MG/5ML PO SOLN: 5.0000 mg | Freq: Once | ORAL | Status: DC | PRN

## 2020-03-09 MED ORDER — SUGAMMADEX SODIUM 200 MG/2ML IV SOLN
INTRAVENOUS | Status: DC | PRN
  Administered 2020-03-09: 200 mg via INTRAVENOUS

## 2020-03-09 MED ORDER — AMISULPRIDE (ANTIEMETIC) 5 MG/2ML IV SOLN: 10.0000 mg | Freq: Once | INTRAVENOUS | Status: DC | PRN

## 2020-03-09 MED ORDER — ONDANSETRON HCL 4 MG/2ML IJ SOLN
INTRAMUSCULAR | Status: AC
  Filled 2020-03-09: qty 2

## 2020-03-09 MED ORDER — FENTANYL CITRATE (PF) 100 MCG/2ML IJ SOLN
INTRAMUSCULAR | Status: DC | PRN
  Administered 2020-03-09: 50 ug via INTRAVENOUS

## 2020-03-09 MED ORDER — SODIUM CHLORIDE 0.9% FLUSH
3.0000 mL | INTRAVENOUS | Status: DC | PRN
Start: 2020-03-09 — End: 2020-03-09

## 2020-03-09 MED ORDER — ACETAMINOPHEN 325 MG RE SUPP: 650.0000 mg | RECTAL | Status: DC | PRN

## 2020-03-09 MED ORDER — HYDROCODONE-ACETAMINOPHEN 5-325 MG PO TABS: 1.0000 | ORAL_TABLET | Freq: Four times a day (QID) | ORAL | 0 refills | Status: DC | PRN

## 2020-03-09 MED ORDER — ROCURONIUM BROMIDE 100 MG/10ML IV SOLN
INTRAVENOUS | Status: DC | PRN
  Administered 2020-03-09: 10 mg via INTRAVENOUS
  Administered 2020-03-09: 50 mg via INTRAVENOUS

## 2020-03-09 MED ORDER — PROPOFOL 10 MG/ML IV BOLUS
INTRAVENOUS | Status: DC | PRN
  Administered 2020-03-09: 120 mg via INTRAVENOUS

## 2020-03-09 MED ORDER — LIDOCAINE HCL (CARDIAC) PF 100 MG/5ML IV SOSY
PREFILLED_SYRINGE | INTRAVENOUS | Status: DC | PRN
  Administered 2020-03-09: 80 mg via INTRAVENOUS

## 2020-03-09 MED ORDER — PROPOFOL 10 MG/ML IV BOLUS
INTRAVENOUS | Status: AC
  Filled 2020-03-09: qty 40

## 2020-03-09 MED ORDER — IOHEXOL 300 MG/ML  SOLN
INTRAMUSCULAR | Status: DC | PRN
  Administered 2020-03-09: 7 mL

## 2020-03-09 MED ORDER — DEXAMETHASONE SODIUM PHOSPHATE 4 MG/ML IJ SOLN
INTRAMUSCULAR | Status: DC | PRN
  Administered 2020-03-09: 4 mg via INTRAVENOUS

## 2020-03-09 MED ORDER — ONDANSETRON HCL 4 MG/2ML IJ SOLN
4.0000 mg | Freq: Once | INTRAMUSCULAR | Status: AC | PRN
  Administered 2020-03-09: 4 mg via INTRAVENOUS

## 2020-03-09 MED ORDER — SODIUM CHLORIDE (PF) 0.9 % IJ SOLN
INTRAMUSCULAR | Status: DC | PRN
  Administered 2020-03-09: 10 mL

## 2020-03-09 MED ORDER — LIDOCAINE HCL (PF) 2 % IJ SOLN
INTRAMUSCULAR | Status: AC
  Filled 2020-03-09: qty 5

## 2020-03-09 MED ORDER — FLEET ENEMA 7-19 GM/118ML RE ENEM: 1.0000 | ENEMA | Freq: Once | RECTAL | Status: DC

## 2020-03-09 MED ORDER — CIPROFLOXACIN IN D5W 400 MG/200ML IV SOLN
400.0000 mg | INTRAVENOUS | Status: AC
  Administered 2020-03-09: 400 mg via INTRAVENOUS

## 2020-03-09 MED ORDER — DEXAMETHASONE SODIUM PHOSPHATE 10 MG/ML IJ SOLN
INTRAMUSCULAR | Status: AC
  Filled 2020-03-09: qty 1

## 2020-03-09 MED ORDER — SUCCINYLCHOLINE CHLORIDE 200 MG/10ML IV SOSY: PREFILLED_SYRINGE | INTRAVENOUS | Status: DC | PRN

## 2020-03-09 MED ORDER — FENTANYL CITRATE (PF) 100 MCG/2ML IJ SOLN: 25.0000 ug | INTRAMUSCULAR | Status: DC | PRN

## 2020-03-09 MED ORDER — ACETAMINOPHEN 325 MG PO TABS: 650.0000 mg | ORAL_TABLET | ORAL | Status: DC | PRN

## 2020-03-09 MED ORDER — SODIUM CHLORIDE 0.9 % IV SOLN
INTRAVENOUS | Status: AC | PRN
  Administered 2020-03-09: 1000 mL

## 2020-03-09 SURGICAL SUPPLY — 41 items
BAG DRN RND TRDRP ANRFLXCHMBR (UROLOGICAL SUPPLIES) ×3
BAG URINE DRAIN 2000ML AR STRL (UROLOGICAL SUPPLIES) ×4 IMPLANT
BLADE CLIPPER SENSICLIP SURGIC (BLADE) ×4 IMPLANT
CATH FOLEY 2WAY SLVR  5CC 16FR (CATHETERS) ×4
CATH FOLEY 2WAY SLVR 5CC 16FR (CATHETERS) ×3 IMPLANT
CATH ROBINSON RED A/P 16FR (CATHETERS) IMPLANT
CATH ROBINSON RED A/P 20FR (CATHETERS) ×4 IMPLANT
CLOTH BEACON ORANGE TIMEOUT ST (SAFETY) ×4 IMPLANT
CNTNR URN SCR LID CUP LEK RST (MISCELLANEOUS) ×6 IMPLANT
CONT SPEC 4OZ STRL OR WHT (MISCELLANEOUS) ×8
COVER BACK TABLE 60X90IN (DRAPES) ×4 IMPLANT
COVER MAYO STAND STRL (DRAPES) ×4 IMPLANT
DRAPE C-ARM 35X43 STRL (DRAPES) ×4 IMPLANT
DRSG TEGADERM 4X4.75 (GAUZE/BANDAGES/DRESSINGS) ×4 IMPLANT
DRSG TEGADERM 8X12 (GAUZE/BANDAGES/DRESSINGS) ×8 IMPLANT
GAUZE SPONGE 4X4 12PLY STRL (GAUZE/BANDAGES/DRESSINGS) ×4 IMPLANT
GLOVE SURG ENC MOIS LTX SZ6.5 (GLOVE) ×4 IMPLANT
GLOVE SURG ENC MOIS LTX SZ7.5 (GLOVE) IMPLANT
GLOVE SURG ENC MOIS LTX SZ8 (GLOVE) IMPLANT
GLOVE SURG ORTHO LTX SZ8.5 (GLOVE) ×4 IMPLANT
GLOVE SURG POLYISO LF SZ6.5 (GLOVE) ×8 IMPLANT
GLOVE SURG POLYISO LF SZ8 (GLOVE) ×8 IMPLANT
GOWN STRL REUS W/ TWL LRG LVL3 (GOWN DISPOSABLE) ×6 IMPLANT
GOWN STRL REUS W/TWL LRG LVL3 (GOWN DISPOSABLE) ×16 IMPLANT
GOWN STRL REUS W/TWL XL LVL3 (GOWN DISPOSABLE) ×4 IMPLANT
HOLDER FOLEY CATH W/STRAP (MISCELLANEOUS) ×4 IMPLANT
I-SEED AGX100 ×276 IMPLANT
IMPL SPACEOAR VUE SYSTEM (Spacer) ×3 IMPLANT
IMPLANT SPACEOAR VUE SYSTEM (Spacer) ×4 IMPLANT
IV NS 1000ML (IV SOLUTION) ×4
IV NS 1000ML BAXH (IV SOLUTION) ×3 IMPLANT
KIT TURNOVER CYSTO (KITS) ×4 IMPLANT
MARKER SKIN DUAL TIP RULER LAB (MISCELLANEOUS) ×4 IMPLANT
PACK CYSTO (CUSTOM PROCEDURE TRAY) ×4 IMPLANT
SURGILUBE 2OZ TUBE FLIPTOP (MISCELLANEOUS) IMPLANT
SUT BONE WAX W31G (SUTURE) IMPLANT
SYR 10ML LL (SYRINGE) IMPLANT
TOWEL OR 17X26 10 PK STRL BLUE (TOWEL DISPOSABLE) ×4 IMPLANT
UNDERPAD 30X36 HEAVY ABSORB (UNDERPADS AND DIAPERS) ×8 IMPLANT
WATER STERILE IRR 3000ML UROMA (IV SOLUTION) ×4 IMPLANT
WATER STERILE IRR 500ML POUR (IV SOLUTION) ×4 IMPLANT

## 2020-03-09 NOTE — H&P (Signed)
Office Visit    xpand All Collapse All  Subjective:  1. Cancer of prostate ([place])   2. Elevated PSA   3. Nodular prostate with lower urinary tract symptoms   4. Nocturia      [name] returns today following a prostate biopsy for his history of low risk prostate cancer on surveillance.   The biopsy demonstrated 3 positive cores with Gleason 6 in the right and left apical lateral cores and Gleason 7(3+4) in the right apical medial core.   The prostate volume was 59m.    His most recent PSA was 13.3 and has been rising.   He had a UTI and went to the ER in late July 2020 and was given antibiotics but had Mx species on the culture.    He has not had any further UTI's.  He passed a stone last year.  His IPSS is10.  His UA today is clear.   He had an MRI fusion biopsy in 3/19 for surveillance of his low risk prostate cancer. He had a single core of 20% of gleason 6 disease in the left lateral apex. he had atypia in one core. His PSA was 8.5 in 9/19 and 9.0 in 1/19. He has no associated signs or symptoms.   He was found to have a T2a Nx Mx Gleason 6 prostate cancer with 5% in one left apical and one right apical core in 07/26/13. A repeat biopsy in 8/16 had a single core of Gleason 6 in the left mid lateral gland with 10% involvement. His PSA prior to this visit is up to 8.4 from 6.6 in 5/18. It had been stable for the last 2 years. His Prostate volume was 268minitally and 3477mn the repeat. He has a history of a 14m26mght base prostate nodule and BOO. He has no associated signs or symptoms.    ROS:  ROS:  A complete review of systems was performed. All systems are negative except for pertinent findings as noted.   ROS       Allergies  Allergen Reactions  . Morphine Swelling and Other (See Comments)    PT DOESNT REMEMBER /         Outpatient Encounter Medications as of 11/22/2019  Medication Sig  . aspirin 81 MG tablet Take 81 mg by mouth daily.  . atMarland Kitchenrvastatin  (LIPITOR) 40 MG tablet Take 1 tablet (40 mg total) by mouth daily.  . glMarland KitchenpiZIDE (GLUCOTROL XL) 10 MG 24 hr tablet Take 1 tablet (10 mg total) by mouth daily.  . glMarland Kitchencose blood (ONETOUCH VERIO) test strip Test 1X per day and as needed  Dx 250.02  . Lancets (ONETOUCH ULTRASOFT) lancets Patient test 1X per day and prn  Dx 250.02  . lisinopril (ZESTRIL) 2.5 MG tablet Take 1 tablet (2.5 mg total) by mouth daily.  . metFORMIN (GLUCOPHAGE) 1000 MG tablet Take 1 tablet (1,000 mg total) by mouth 2 (two) times daily with a meal.  . metoprolol succinate (TOPROL-XL) 50 MG 24 hr tablet Take 1 tablet (50 mg total) by mouth daily.  . Omega-3 Fatty Acids (FISH OIL) 1000 MG CAPS Take 1 capsule by mouth daily.   . omMarland Kitchenprazole (PRILOSEC) 40 MG capsule Take 1 capsule (40 mg total) by mouth daily.  . vitamin C (ASCORBIC ACID) 500 MG tablet Take 500 mg by mouth daily.     No facility-administered encounter medications on file as of 11/22/2019.        Past Medical History:  Diagnosis Date  . Cancer [facility])    prostate cancer  . Coronary artery disease    3V CABG 2000. Last heart cath 2005, with 2 patent grafts  . Diabetes mellitus   . Hyperlipidemia   . Hypertension          Past Surgical History:  Procedure Laterality Date  . APPENDECTOMY    . CARDIAC CATHETERIZATION  2005  . CORONARY ARTERY BYPASS GRAFT  2000   3 vessel  . HERNIA REPAIR  1974  . PROSTATE BIOPSY  06/2017   x 4-5     Social History        Socioeconomic History  . Marital status: Married    Spouse name: Vaughan Basta   . Number of children: 3  . Years of education: Not on file  . Highest education level: Not on file  Occupational History  . Occupation: part-time Pharmacist, community    Comment: retired   Tobacco Use  . Smoking status: Former Smoker    Quit date: 01/13/1997    Years since quitting: 22.8  . Smokeless tobacco: Never Used  Vaping Use  . Vaping Use: Never used  Substance and Sexual Activity  . Alcohol  use: No  . Drug use: No  . Sexual activity: Not on file  Other Topics Concern  . Not on file  Social History Narrative  . Not on file   Social Determinants of Health      Financial Resource Strain:   . Difficulty of Paying Living Expenses: Not on file  Food Insecurity:   . Worried About Charity fundraiser in the Last Year: Not on file  . Ran Out of Food in the Last Year: Not on file  Transportation Needs:   . Lack of Transportation (Medical): Not on file  . Lack of Transportation (Non-Medical): Not on file  Physical Activity:   . Days of Exercise per Week: Not on file  . Minutes of Exercise per Session: Not on file  Stress:   . Feeling of Stress : Not on file  Social Connections:   . Frequency of Communication with Friends and Family: Not on file  . Frequency of Social Gatherings with Friends and Family: Not on file  . Attends Religious Services: Not on file  . Active Member of Clubs or Organizations: Not on file  . Attends Archivist Meetings: Not on file  . Marital Status: Not on file  Intimate Partner Violence:   . Fear of Current or Ex-Partner: Not on file  . Emotionally Abused: Not on file  . Physically Abused: Not on file  . Sexually Abused: Not on file         Family History  Problem Relation Age of Onset  . Lung cancer Mother   . Cancer Mother   . Dementia Father   . Hyperlipidemia Father   . Congestive Heart Failure Father   . Post-traumatic stress disorder Son   . Alcohol abuse Maternal Grandfather   . Cancer Maternal Grandfather 90       stomach cancer   . Heart attack Neg Hx   . Stroke Neg Hx        Objective:    Vitals:   11/22/19 1541  BP: 114/68  Pulse: 80  Temp: 98.1 F (36.7 C)     Physical Exam  Lab Results:  Lab Results Last 24 Hours  No results found for this or any previous visit (from the past 24 hour(s)).  BMET Recent Labs (last 2 labs)   No results for input(s): NA, K, CL, CO2,  GLUCOSE, BUN, CREATININE, CALCIUM in the last 72 hours.   PSA Last labs         PSA  Date Value Ref Range Status  03/30/2019 10.4 (H) < OR = 4.0 ng/mL Final    Comment:    The total PSA value from this assay system is  standardized against the WHO standard. The test  result will be approximately 20% lower when compared  to the equimolar-standardized total PSA (Beckman  Coulter). Comparison of serial PSA results should be  interpreted with this fact in mind. . This test was performed using the Siemens  chemiluminescent method. Values obtained from  different assay methods cannot be used interchangeably. PSA levels, regardless of value, should not be interpreted as absolute evidence of the presence or absence of disease.   11/18/2013 5.0 (H) 0.0 - 4.0 ng/mL Final    Comment:    Roche ECLIA methodology. According to the American Urological Association, Serum PSA should decrease and remain at undetectable levels after radical prostatectomy. The AUA defines biochemical recurrence as an initial PSA value 0.2 ng/mL or greater followed by a subsequent confirmatory PSA value 0.2 ng/mL or greater. Values obtained with different assay methods or kits cannot be used interchangeably. Results cannot be interpreted as absolute evidence of the presence or absence of malignant disease.   05/20/2013 5.3 (H) 0.0 - 4.0 ng/mL Final    Comment:    Roche ECLIA methodology. According to the American Urological Association, Serum PSA should decrease and remain at undetectable levels after radical prostatectomy. The AUA defines biochemical recurrence as an initial PSA value 0.2 ng/mL or greater followed by a subsequent confirmatory PSA value 0.2 ng/mL or greater. Values obtained with different assay methods or kits cannot be used interchangeably. Results cannot be interpreted as absolute evidence of the presence or absence of malignant disease.     Last labs  No results found  for: TESTOSTERONE          Recent Results (from the past 2160 hour(s))  PSA     Status: Abnormal   Collection Time: 09/23/19 10:07 AM  Result Value Ref Range   Prostate Specific Ag, Serum 13.3 (H) 0.0 - 4.0 ng/mL    Comment: Roche ECLIA methodology. According to the American Urological Association, Serum PSA should decrease and remain at undetectable levels after radical prostatectomy. The AUA defines biochemical recurrence as an initial PSA value 0.2 ng/mL or greater followed by a subsequent confirmatory PSA value 0.2 ng/mL or greater. Values obtained with different assay methods or kits cannot be used interchangeably. Results cannot be interpreted as absolute evidence of the presence or absence of malignant disease.   Urinalysis, Routine w reflex microscopic     Status: Abnormal   Collection Time: 09/30/19 10:20 AM  Result Value Ref Range   Specific Gravity, UA 1.020 1.005 - 1.030   pH, UA 5.5 5.0 - 7.5   Color, UA Yellow Yellow   Appearance Ur Clear Clear   Leukocytes,UA Negative Negative   Protein,UA Trace (A) Negative/Trace   Glucose, UA Negative Negative   Ketones, UA Trace (A) Negative   RBC, UA Negative Negative   Bilirubin, UA Negative Negative   Urobilinogen, Ur 1.0 0.2 - 1.0 mg/dL   Nitrite, UA Negative Negative   Microscopic Examination Comment     Comment: Microscopic follows if indicated.  Bayer DCA Hb A1c Waived  Status: None   Collection Time: 10/17/19 10:33 AM  Result Value Ref Range   HB A1C (BAYER DCA - WAIVED) 5.9 <7.0 %    Comment:                                       Diabetic Adult            <7.0                                       Healthy Adult        4.3 - 5.7                                                           (DCCT/NGSP) American Diabetes Association's Summary of Glycemic Recommendations for Adults with Diabetes: Hemoglobin A1c <7.0%. More stringent glycemic goals (A1c <6.0%) may further reduce  complications at the cost of increased risk of hypoglycemia.   CBC with Differential/Platelet     Status: None   Collection Time: 10/17/19 11:10 AM  Result Value Ref Range   WBC 6.0 3.4 - 10.8 x10E3/uL   RBC 4.35 4.14 - 5.80 x10E6/uL   Hemoglobin 13.4 13.0 - 17.7 g/dL   Hematocrit 38.2 37.5 - 51.0 %   MCV 88 79 - 97 fL   MCH 30.8 26.6 - 33.0 pg   MCHC 35.1 31 - 35 g/dL   RDW 13.9 11.6 - 15.4 %   Platelets 165 150 - 450 x10E3/uL   Neutrophils 64 Not Estab. %   Lymphs 22 Not Estab. %   Monocytes 8 Not Estab. %   Eos 5 Not Estab. %   Basos 1 Not Estab. %   Neutrophils Absolute 3.9 1.40 - 7.00 x10E3/uL   Lymphocytes Absolute 1.3 0 - 3 x10E3/uL   Monocytes Absolute 0.5 0 - 0 x10E3/uL   EOS (ABSOLUTE) 0.3 0.0 - 0.4 x10E3/uL   Basophils Absolute 0.1 0 - 0 x10E3/uL   Immature Granulocytes 0 Not Estab. %   Immature Grans (Abs) 0.0 0.0 - 0.1 x10E3/uL  CMP14+EGFR     Status: Abnormal   Collection Time: 10/17/19 11:10 AM  Result Value Ref Range   Glucose 96 65 - 99 mg/dL   BUN 14 8 - 27 mg/dL   Creatinine, Ser 1.38 (H) 0.76 - 1.27 mg/dL   GFR calc non Af Amer 50 (L) >59 mL/min/1.73   GFR calc Af Amer 58 (L) >59 mL/min/1.73    Comment: **[facility] currently reports eGFR in compliance with the current**   recommendations of the [employer]. [facility] will   update reporting as new guidelines are published from the NKF-ASN   Task force.    BUN/Creatinine Ratio 10 10 - 24   Sodium 140 134 - 144 mmol/L   Potassium 4.8 3.5 - 5.2 mmol/L   Chloride 105 96 - 106 mmol/L   CO2 22 20 - 29 mmol/L   Calcium 9.3 8.6 - 10.2 mg/dL   Total Protein 6.7 6.0 - 8.5 g/dL   Albumin 4.1 3.7 - 4.7 g/dL   Globulin, Total 2.6 1.5 - 4.5 g/dL   Albumin/Globulin  Ratio 1.6 1.2 - 2.2   Bilirubin Total 0.4 0.0 - 1.2 mg/dL   Alkaline Phosphatase 90 44 - 121 IU/L    Comment:               **Please note reference interval change**   AST 13 0 - 40 IU/L    ALT 15 0 - 44 IU/L  Lipid panel     Status: Abnormal   Collection Time: 10/17/19 11:10 AM  Result Value Ref Range   Cholesterol, Total 103 100 - 199 mg/dL   Triglycerides 86 0 - 149 mg/dL   HDL 31 (L) >39 mg/dL   VLDL Cholesterol Cal 17 5 - 40 mg/dL   LDL Chol Calc (NIH) 55 0 - 99 mg/dL   Chol/HDL Ratio 3.3 0.0 - 5.0 ratio    Comment:                                   T. Chol/HDL Ratio                                             Men  Women                               1/2 Avg.Risk  3.4    3.3                                   Avg.Risk  5.0    4.4                                2X Avg.Risk  9.6    7.1                                3X Avg.Risk 23.4   11.0      Studies/Results: Path report reviewed.   CT and Bone scan reviewed.   Assessment & Plan: Prostate cancer with grade progression on recent biopsy.  He T2a Nx Mx Gleason 7(3+4) disease.  He has intermediate risk disease and a rising PSA of 13.3.  His prostate volume is only 37m.  I am going to get him staged with a CT and bone scan and will have him set up to see Dr. MTammi Klippelfor consideration of a seed implant or EXRT.  With his comorbidities I don't think he is the best candidate for surgical therapy.   His Bone scan and CT were negative for mets.     He has elected to proceed with a seed implant.   CC: MChevis Pretty FNP      JIrine Seal11/09/2019         I

## 2020-03-09 NOTE — Anesthesia Preprocedure Evaluation (Signed)
Anesthesia Evaluation  Patient identified by MRN, date of birth, ID band Patient awake    Reviewed: Allergy & Precautions, NPO status , Patient's Chart, lab work & pertinent test results  History of Anesthesia Complications Negative for: history of anesthetic complications  Airway Mallampati: I  TM Distance: >3 FB Neck ROM: Full    Dental  (+) Edentulous Upper, Edentulous Lower   Pulmonary neg pulmonary ROS, former smoker,    Pulmonary exam normal        Cardiovascular Exercise Tolerance: Good hypertension, Pt. on medications and Pt. on home beta blockers + CAD, + Past MI, + Cardiac Stents, + CABG and + Peripheral Vascular Disease  Normal cardiovascular exam     Neuro/Psych negative neurological ROS  negative psych ROS   GI/Hepatic Neg liver ROS, hiatal hernia, GERD  ,  Endo/Other  diabetes, Type 2, Oral Hypoglycemic Agents  Renal/GU Renal InsufficiencyRenal disease (CKD III, Cr 1.53)   Prostate cancer    Musculoskeletal negative musculoskeletal ROS (+)   Abdominal   Peds  Hematology negative hematology ROS (+)   Anesthesia Other Findings  Echo 03/30/19: EF 50-55%, g1dd, normal RV function, mild MR  Cardiology note 02/08/20: "Patient is scheduled for seed implant for prostate cancer next month.  He is easily getting greater than 4 METS of activity without cardiac symptoms.  He is very active at baseline.  Recent EKG without ischemic changes.  He will be cleared at acceptable risk."  Reproductive/Obstetrics                            Anesthesia Physical Anesthesia Plan  ASA: III  Anesthesia Plan: General   Post-op Pain Management:    Induction: Intravenous  PONV Risk Score and Plan: 2 and Ondansetron, Dexamethasone, Midazolam and Treatment may vary due to age or medical condition  Airway Management Planned: LMA  Additional Equipment: None  Intra-op Plan:   Post-operative  Plan: Extubation in OR  Informed Consent: I have reviewed the patients History and Physical, chart, labs and discussed the procedure including the risks, benefits and alternatives for the proposed anesthesia with the patient or authorized representative who has indicated his/her understanding and acceptance.     Dental advisory given  Plan Discussed with:   Anesthesia Plan Comments:         Anesthesia Quick Evaluation

## 2020-03-09 NOTE — Discharge Instructions (Signed)
Brachytherapy for Prostate Cancer, Care After This sheet gives you information about how to care for yourself after your procedure. Your health care provider may also give you more specific instructions. If you have problems or questions, contact your health care provider. What can I expect after the procedure? After the procedure, it is common to have:  Urinary symptoms. These may include: ? Trouble passing urine. ? Blood in the urine or semen. ? Frequent feeling of an urgent need to urinate.  Constipation, nausea, or bloating and gas.  Bruising, swelling, and tenderness of the area beneath the scrotum (perineum).  Tiredness (fatigue).  Burning or pain in the rectum.  Problems getting or keeping an erection (erectile dysfunction).  Follow these instructions at home: Eating and drinking  Drink enough fluid to keep your urine pale yellow.  Eat a healthy, balanced diet. This includes lean proteins, whole grains, and plenty of fruits and vegetables.  If you drink alcohol: ? Limit how much you have to 0-2 drinks a day. ? Be aware of how much alcohol is in your drink. In the [place] one drink equals one 12 oz bottle of beer (355 mL), one 5 oz glass of wine (148 mL), or one 1 oz glass of hard liquor (44 mL).   Managing pain, stiffness, and swelling  If directed, put ice on the affected area. To do this: ? Put ice in a plastic bag. ? Place a towel between your skin and the bag. ? Leave the ice on for 20 minutes, 2-3 times a day.  Try not to sit directly on the perineum. A soft cushion can help with discomfort.   Activity  If you were given a sedative during the procedure, it can affect you for several hours. Do not drive or operate machinery until your health care provider says that it is safe.  Do not lift anything that is heavier than 10 lb (4.5 kg), or the limit that you are told, until your health care provider says that it is safe.  Rest as told by your health care  provider.  Return to your normal activities as told by your health care provider. Most people can return to normal activities a few days or weeks after the procedure. Ask your health care provider what activities are safe for you.   Treatment area care Check your treatment area every day for signs of infection. Check for:  Redness, swelling, or pain.  Fluid or blood.  Warmth.  Pus or a bad smell. Managing constipation Your procedure may cause constipation. To prevent or treat constipation, you may need to:  Take over-the-counter or prescription medicines.  Eat foods that are high in fiber, such as beans, whole grains, and fresh fruits and vegetables.  Limit foods that are high in fat and processed sugars, such as fried or sweet foods. General instructions  Take over-the-counter and prescription medicines only as told by your health care provider.  Do not take baths, swim, or use a hot tub until your health care provider approves. Shower and wash the perineum gently.  Do not have sex for one week after the treatment, or until your health care provider approves.  Do not use any products that contain nicotine or tobacco, such as cigarettes, e-cigarettes, and chewing tobacco. If you need help quitting, ask your health care provider.  If you have permanent, low-dose brachytherapy implants: ? Limit close contact with children and pregnant women for 2 months or as told by your health care provider.  This is important because of the radiation that is still active in the prostate. ? You may set off radioactive sensors, such as at airport screenings. Ask your health care provider for a document that explains your treatment. ? You may be told to use a condom during sex for the first 2 months after low-dose brachytherapy.  Keep all follow-up visits as told by your health care provider. This is important. You may still need additional treatment. Contact a health care provider if you:  Have a  fever or chills.  Have any of these signs of infection in the treatment area: ? Redness, swelling, or pain. ? Fluid or blood. ? Warmth. ? Pus or a bad smell.  Have no bowel movements for 3-4 days after the procedure.  Have diarrhea for 3-4 days after the procedure.  Develop any new symptoms, such as problems with urinating or erectile dysfunction.  Have pain in your abdomen.  Have more blood in your urine.  Have swelling or pain in your legs. Get help right away if:  You cannot urinate.  You have a lot of bleeding from your rectum.  You have unusual drainage coming from your rectum.  You have severe pain in the treated area that does not go away with pain medicine.  You have severe nausea or vomiting.  You have difficulty breathing. Summary  Talk with your health care provider about your risk of brachytherapy side effects, such as erectile dysfunction or urinary problems.  If you have permanent, low-dose brachytherapy implants, limit close contact with children and pregnant women for 2 months or as told by your health care provider. This is important because of the radiation that is still active in the prostate.  You may be told to use a condom during sex for the first 2 months after low-dose brachytherapy.  Keep all follow-up visits as told by your health care provider. This is important. You may need additional treatment. This information is not intended to replace advice given to you by your health care provider. Make sure you discuss any questions you have with your health care provider. Document Revised: 11/01/2018 Document Reviewed: 11/01/2018 Elsevier Patient Education  2021 [employer] Instructions  Activity: Get plenty of rest for the remainder of the day. A responsible individual must stay with you for 24 hours following the procedure.  For the next 24 hours, DO NOT: -Drive a car -Paediatric nurse -Drink alcoholic  beverages -Take any medication unless instructed by your physician -Make any legal decisions or sign important papers.  Meals: Start with liquid foods such as gelatin or soup. Progress to regular foods as tolerated. Avoid greasy, spicy, heavy foods. If nausea and/or vomiting occur, drink only clear liquids until the nausea and/or vomiting subsides. Call your physician if vomiting continues.  Special Instructions/Symptoms: Your throat may feel dry or sore from the anesthesia or the breathing tube placed in your throat during surgery. If this causes discomfort, gargle with warm salt water. The discomfort should disappear within 24 hours.

## 2020-03-09 NOTE — Progress Notes (Signed)
°  Radiation Oncology         [phone] ________________________________  Name: Jacob Rios MRN: [id number]  Date: 03/09/2020  DOB: 1945/06/06       Prostate Seed Implant  ER:DEYCXK, [name], FNP  No ref. provider found  DIAGNOSIS:   75 y.o. gentleman with Stage T2a adenocarcinoma of the prostate with Gleason score of 3+4, and PSA of 13.3.  Oncology History  Malignant neoplasm of prostate ([place])  11/11/2019 Cancer Staging   Staging form: Prostate, AJCC 8th Edition - Clinical stage from 11/11/2019: Stage IIB (cT2a, cN0, cM0, PSA: 13.3, Grade Group: 2) - Signed by Freeman Caldron, PA-C on 12/20/2019   12/20/2019 Initial Diagnosis   Malignant neoplasm of prostate ([place])     No diagnosis found.  PROCEDURE: Insertion of radioactive I-125 seeds into the prostate gland.  RADIATION DOSE: 145 Gy, definitive therapy.  TECHNIQUE: Jacob Rios was brought to the operating room with the urologist. He was placed in the dorsolithotomy position. He was catheterized and a rectal tube was inserted. The perineum was shaved, prepped and draped. The ultrasound probe was then introduced into the rectum to see the prostate gland.  TREATMENT DEVICE: A needle grid was attached to the ultrasound probe stand and anchor needles were placed.  3D PLANNING: The prostate was imaged in 3D using a sagittal sweep of the prostate probe. These images were transferred to the planning computer. There, the prostate, urethra and rectum were defined on each axial reconstructed image. Then, the software created an optimized 3D plan and a few seed positions were adjusted. The quality of the plan was reviewed using [facility] information for the target and the following two organs at risk:  Urethra and Rectum.  Then the accepted plan was printed and handed off to the radiation therapist.  Under my supervision, the custom loading of the seeds and spacers was carried out and loaded into sealed vicryl sleeves.  These pre-loaded  needles were then placed into the needle holder.Marland Kitchen  PROSTATE VOLUME STUDY:  Using transrectal ultrasound the volume of the prostate was verified to be 40 cc.  SPECIAL TREATMENT PROCEDURE/SUPERVISION AND HANDLING: The pre-loaded needles were then delivered under sagittal guidance. A total of 19 needles were used to deposit 69 seeds in the prostate gland. The individual seed activity was 0.447 mCi.  SpaceOAR:  Yes  COMPLEX SIMULATION: At the end of the procedure, an anterior radiograph of the pelvis was obtained to document seed positioning and count. Cystoscopy was performed to check the urethra and bladder.  MICRODOSIMETRY: At the end of the procedure, the patient was emitting 0.098 mR/hr at 1 meter. Accordingly, he was considered safe for hospital discharge.  PLAN: The patient will return to the radiation oncology clinic for post implant CT dosimetry in three weeks.   ________________________________  [name] Tammi Klippel, M.D.

## 2020-03-09 NOTE — Anesthesia Procedure Notes (Signed)
Procedure Name: Intubation Date/Time: 03/09/2020 7:42 AM Performed by: Georgeanne Nim, CRNA Pre-anesthesia Checklist: Patient identified, Emergency Drugs available, Suction available, Patient being monitored and Timeout performed Patient Re-evaluated:Patient Re-evaluated prior to induction Oxygen Delivery Method: Circle system utilized Preoxygenation: Pre-oxygenation with 100% oxygen Induction Type: IV induction Ventilation: Mask ventilation without difficulty and Oral airway inserted - appropriate to patient size Laryngoscope Size: Mac and 4 Grade View: Grade I Tube type: Oral Tube size: 7.5 mm Number of attempts: 1 Airway Equipment and Method: Stylet Placement Confirmation: ETT inserted through vocal cords under direct vision,  positive ETCO2,  CO2 detector and breath sounds checked- equal and bilateral Secured at: 23 cm Tube secured with: Tape Dental Injury: Teeth and Oropharynx as per pre-operative assessment

## 2020-03-09 NOTE — Anesthesia Postprocedure Evaluation (Signed)
Anesthesia Post Note  Patient: Jacob Rios  Procedure(s) Performed: RADIOACTIVE SEED IMPLANT/BRACHYTHERAPY IMPLANT (N/A Prostate) SPACE OAR INSTILLATION (N/A Rectum) CYSTOSCOPY FLEXIBLE (N/A Bladder)     Patient location during evaluation: PACU Anesthesia Type: General Level of consciousness: awake and alert Pain management: pain level controlled Vital Signs Assessment: post-procedure vital signs reviewed and stable Respiratory status: spontaneous breathing, nonlabored ventilation and respiratory function stable Cardiovascular status: blood pressure returned to baseline and stable Postop Assessment: no apparent nausea or vomiting Anesthetic complications: no   No complications documented.  Last Vitals:  Vitals:   03/09/20 0930 03/09/20 1015  BP: (!) 142/81 (!) 148/72  Pulse: 69 70  Resp: 16 16  Temp: 36.6 C 36.7 C  SpO2: 97% 97%    Last Pain:  Vitals:   03/09/20 1015  TempSrc:   PainSc: 0-No pain                 Lidia Collum

## 2020-03-09 NOTE — Op Note (Signed)
PATIENT:  Jacob Rios  PRE-OPERATIVE DIAGNOSIS:  Adenocarcinoma of the prostate  POST-OPERATIVE DIAGNOSIS:  Same  PROCEDURE:  Procedure(s): 1. I-125 radioactive seed implantation 2. SpaceOAR implantation. 3.  Cystoscopy  SURGEON:  Surgeon(s): [name] MD  Radiation oncologist: Dr. Tyler Pita  ANESTHESIA:  General  EBL:  None.  DRAINS: 15 French Foley catheter  INDICATION: Jacob Rios is a 75 y.o. with Stage T2a, Gleason Gl 7(3+4)  prostate cancer who has elected brachytherapy for treatment.  Description of procedure: After informed consent the patient was brought to the major OR, placed on the table and administered general anesthesia. He was then moved to the modified lithotomy position with his perineum perpendicular to the floor. His perineum and genitalia were then sterilely prepped. An official timeout was then performed. A 16 French Foley catheter was then placed in the bladder and filled with dilute contrast, a rectal tube was placed in the rectum and the transrectal ultrasound probe was placed in the rectum and affixed to the stand. He was then sterilely draped.  The sterile grid was installed.   Anchor needles were then placed.   Real time ultrasonography was used along with the seed planning software spot-pro version 3.1-00. This was used to develop the seed plan including the number of needles as well as number of seeds required for complete and adequate coverage. Real-time ultrasonography was then used along with the previously developed plan  to implant a total of 69 seeds using 19 needles for a target dose of 145 Gy. This proceeded without difficulty or complication.  The anchor needles and guide were removed and the SpaceOAR needle was passed under US guidance into the fat stripe posterior to the prostate with the tip in the midline at mid prostate. A puff of NS confirmed appropriate positioning and the [facility] polymer was then injected over 12 seconds into  the space with excellent distribution.     A Foley catheter was then removed as well as the transrectal ultrasound probe and rectal probe. Flexible cystoscopy was then performed using the 17 French flexible scope which revealed a normal urethra throughout its length down to the sphincter which appeared intact. The prostatic urethra was 2-3 cm with bilobar hyperplasia with mild coaptation. The bladder was then entered and fully and systematically inspected.  The ureteral orifices were noted to be of normal configuration and position. The mucosa revealed no evidence of tumors. There were also no stones identified within the bladder.  There was a small clot but no seeds or spacers were seen and/or removed from the bladder.  The cystoscope was then removed.  The drapes were removed.  The perineum was cleaned and dressed.  He was taken out of the lithotomy position and was awakened and taken to recovery room in stable and satisfactory condition. He tolerated procedure well and there were no intraoperative complications.

## 2020-03-09 NOTE — Transfer of Care (Signed)
Immediate Anesthesia Transfer of Care Note  Patient: Jacob Rios  Procedure(s) Performed: RADIOACTIVE SEED IMPLANT/BRACHYTHERAPY IMPLANT (N/A Prostate) SPACE OAR INSTILLATION (N/A Rectum) CYSTOSCOPY FLEXIBLE (N/A Bladder)  Patient Location: PACU  Anesthesia Type:General  Level of Consciousness: awake, alert , oriented and patient cooperative  Airway & Oxygen Therapy: Patient Spontanous Breathing and Patient connected to nasal cannula oxygen  Post-op Assessment: Report given to RN and Post -op Vital signs reviewed and stable  Post vital signs: Reviewed and stable  Last Vitals:  Vitals Value Taken Time  BP 129/73 03/09/20 0857  Temp    Pulse 72 03/09/20 0859  Resp 17 03/09/20 0859  SpO2 97 % 03/09/20 0859  Vitals shown include unvalidated device data.  Last Pain:  Vitals:   03/09/20 0542  TempSrc: Oral  PainSc: 0-No pain      Patients Stated Pain Goal: 5 (45/36/46 8032)  Complications: No complications documented.

## 2020-03-12 ENCOUNTER — Encounter (HOSPITAL_BASED_OUTPATIENT_CLINIC_OR_DEPARTMENT_OTHER): Payer: Self-pay | Admitting: Urology

## 2020-03-22 ENCOUNTER — Telehealth: Payer: Self-pay | Admitting: Radiation Oncology

## 2020-03-22 NOTE — Telephone Encounter (Signed)
Received TELEPHONE ADVICE RECORD from Access Nurse that patient called to inquire if his "radioactive seeds could run his sugar high." Phoned patient to inquire about current status. Patient reports the machine he was using to check his blood sugar was reading incorrectly. Patient explains he got a new monitor and his sugar reading are now normal. Patient without other complaints or needs.

## 2020-03-28 NOTE — Progress Notes (Signed)
Subjective:  1. Cancer of prostate (Jacob Rios)   2. Nodular prostate with lower urinary tract symptoms   3. Nocturia      03/29/20: Jacob Rios returns today in f/u from his recent seed implant on 03/09/20.  He is voiding well with an IPSS of 8. He has nocturia x 4.  He has some hesitancy.  His UA has >30 RBC's.   He has felt a lump about an inch up in the rectum that is probably the [place].  He has mild dysuria.     11/22/19: Jacob Rios returns today following a prostate biopsy for his history of low risk prostate cancer on surveillance.   The biopsy demonstrated 3 positive cores with Gleason 6 in the right and left apical lateral cores and Gleason 7(3+4) in the right apical medial core.   The prostate volume was 38m.    His most recent PSA was 13.3 and has been rising.   He had a UTI and went to the ER in late July 2020 and was given antibiotics but had Mx species on the culture.    He has not had any further UTI's.  He passed a stone last year.  His IPSS is10.  His UA today is clear.   He had an MRI fusion biopsy in 3/19 for surveillance of his low risk prostate cancer. He had a single core of 20% of gleason 6 disease in the left lateral apex. he had atypia in one core. His PSA was 8.5 in 9/19 and 9.0 in 1/19. He has no associated signs or symptoms.   He was found to have a T2a Nx Mx Gleason 6 prostate cancer with 5% in one left apical and one right apical core in 07/26/13. A repeat biopsy in 8/16 had a single core of Gleason 6 in the left mid lateral gland with 10% involvement. His PSA prior to this visit is up to 8.4 from 6.6 in 5/18. It had been stable for the last 2 years. His Prostate volume was 259minitally and 3425mn the repeat. He has a history of a 50m50mght base prostate nodule and BOO. He has no associated signs or symptoms.  IPSS    Row Name 03/29/20 1200         International Prostate Symptom Score   How often have you had the sensation of not emptying your bladder? Less than 1 in 5      How often have you had to urinate less than every two hours? Less than 1 in 5 times     How often have you found you stopped and started again several times when you urinated? Less than 1 in 5 times     How often have you found it difficult to postpone urination? Not at All     How often have you had a weak urinary stream? Less than 1 in 5 times     How often have you had to strain to start urination? Not at All     How many times did you typically get up at night to urinate? 4 Times     Total IPSS Score 8           Quality of Life due to urinary symptoms   If you were to spend the rest of your life with your urinary condition just the way it is now how would you feel about that? Pleased             ROS:  ROS:  A complete review of systems was performed.  All systems are negative except for pertinent findings as noted.   ROS  Allergies  Allergen Reactions  . Morphine Shortness Of Breath and Swelling    Outpatient Encounter Medications as of 03/29/2020  Medication Sig  . aspirin 81 MG tablet Take 81 mg by mouth daily.  Marland Kitchen atorvastatin (LIPITOR) 40 MG tablet Take 1 tablet by mouth once daily (Patient taking differently: Take 40 mg by mouth at bedtime.)  . Cholecalciferol (D3 ADULT PO) Take 1 tablet by mouth daily.  Marland Kitchen CRANBERRY PO Take by mouth daily.  Marland Kitchen glipiZIDE (GLUCOTROL XL) 10 MG 24 hr tablet Take 1 tablet (10 mg total) by mouth daily. (Patient taking differently: Take 10 mg by mouth daily.)  . glucose blood (ONETOUCH VERIO) test strip Test 1X per day and as needed  Dx 250.02  . Lancets (ONETOUCH ULTRASOFT) lancets Patient test 1X per day and prn  Dx 250.02  . lisinopril (ZESTRIL) 2.5 MG tablet Take 1 tablet (2.5 mg total) by mouth daily. (Patient taking differently: Take 2.5 mg by mouth daily.)  . metFORMIN (GLUCOPHAGE) 1000 MG tablet Take 1 tablet (1,000 mg total) by mouth 2 (two) times daily with a meal. (Patient taking differently: Take 1,000 mg by mouth 2 (two) times daily  with a meal.)  . metoprolol succinate (TOPROL-XL) 50 MG 24 hr tablet Take 1 tablet (50 mg total) by mouth daily. Take with or immediately following a meal.  . omeprazole (PRILOSEC) 40 MG capsule Take 1 capsule (40 mg total) by mouth daily. (Patient taking differently: Take 40 mg by mouth daily.)  . vitamin C (ASCORBIC ACID) 500 MG tablet Take 500 mg by mouth daily.  Marland Kitchen HYDROcodone-acetaminophen (NORCO/VICODIN) 5-325 MG tablet Take 1 tablet by mouth every 6 (six) hours as needed for moderate pain. (Patient not taking: Reported on 03/29/2020)  . Phenyleph-Doxylamine-DM-APAP (ALKA-SELTZER PLS ALLERGY & [facility] PO) Take 1 tablet by mouth once. As needed   No facility-administered encounter medications on file as of 03/29/2020.    Past Medical History:  Diagnosis Date  . CKD (chronic kidney disease), stage III ([place])    followed by pcp  . Coronary artery disease cardiologist--- dr Angelena Form   08/ 1999  s/p  cath w/ PTCA and stenting to RCA;   05/ 2000 inferior wall MI , 06-06-1998 s/p cabg x3;   Last heart cath 2005,  2 patent grafts (diagnol and RCA) and occluded LIMA--LAD graft with normal LAD nonobstructive disease;  last nuclear study 11/ 2014 no evidence ishcemia, ef 40%  . Full dentures   . Hiatal hernia   . History of acute inferior wall MI 05/1998   s/p  cabg  . Hypertension   . Mixed hyperlipidemia   . Nocturia more than twice per night   . PAD (peripheral artery disease) (HCC)    left common iliac artery stenosis per aorta ultrasound 03/ 2021 in epic and cardiology note  . Prostate cancer [facility]) urologist--- dr Jeffie Pollock   first dx 07/ 2015 in active survillance until bx 11-11-2019,  Stage T2a, Gleason 3+4, PSA 13.3  . S/P CABG x 3 06/06/1998   LIMA--LAD, SVG to Diagonal, RIMA to RCA  . S/P primary angioplasty with coronary stent 08/1997   stent to RCA  . Type 2 diabetes mellitus ([place])    followed by pcp  (03-06-2020 checks blood sugar dialy in am,  fasting sugar-- 110-130)    Past Surgical  History:  Procedure Laterality  Date  . APPENDECTOMY  child  . CARDIAC CATHETERIZATION  09/01/2000  '@[facility]'    patent RCA and Diagonal grafts, atretic LIMA, moderate nonobstructive proxLAD, normal lvsf  . CARDIAC CATHETERIZATION  04-21-2003  '@[facility]'    LIMA--LAD graft occluded, native LAD with nonobstructive disease,  patent diagonal/ rca grafts  . CATARACT EXTRACTION W/ INTRAOCULAR LENS IMPLANT Right 12/2018  . CORONARY ANGIOPLASTY WITH STENT PLACEMENT  08/1997  '@[facility]'    ptca w/ stenting to rca  . CORONARY ARTERY BYPASS GRAFT  06-06-1998  '@[facility]'     LIMA -- LAD, SVG -- Diagonal,  RIMA to RCA  . CYSTOSCOPY N/A 03/09/2020   Procedure: CYSTOSCOPY FLEXIBLE;  Surgeon: [name], MD;  Location: [facility];  Service: Urology;  Laterality: N/A;  NO SEEDS FOUND IN BLADDER  . INGUINAL HERNIA REPAIR Right 1974  . PROSTATE BIOPSY  06/2017   x 4-5   . RADIOACTIVE SEED IMPLANT N/A 03/09/2020   Procedure: RADIOACTIVE SEED IMPLANT/BRACHYTHERAPY IMPLANT;  Surgeon: [name], MD;  Location: [facility];  Service: Urology;  Laterality: N/A;   70  SEEDS IMPLANTED  . SPACE OAR INSTILLATION N/A 03/09/2020   Procedure: SPACE OAR INSTILLATION;  Surgeon: [name], MD;  Location: [facility];  Service: Urology;  Laterality: N/A;  . VENTRAL HERNIA REPAIR  09/ 2000 and recurrent repair 06/ 2001    Social History   Socioeconomic History  . Marital status: Married    Spouse name: Vaughan Basta   . Number of children: 3  . Years of education: Not on file  . Highest education level: Not on file  Occupational History  . Occupation: part-time Pharmacist, community    Comment: retired   Tobacco Use  . Smoking status: Former Smoker    Packs/day: 4.00    Years: 45.00    Pack years: 180.00    Types: Cigarettes    Quit date: 01/13/1997    Years since quitting: 23.2  . Smokeless tobacco: Never Used  . Tobacco comment: reports he began smoking at age 84  Vaping Use  . Vaping Use: Never used  Substance  and Sexual Activity  . Alcohol use: No  . Drug use: Never  . Sexual activity: Not on file  Other Topics Concern  . Not on file  Social History Narrative  . Not on file   Social Determinants of Health   Financial Resource Strain: Not on file  Food Insecurity: Not on file  Transportation Needs: Not on file  Physical Activity: Not on file  Stress: Not on file  Social Connections: Not on file  Intimate Partner Violence: Not on file    Family History  Problem Relation Age of Onset  . Lung cancer Mother   . Dementia Father   . Hyperlipidemia Father   . Congestive Heart Failure Father   . Post-traumatic stress disorder Son   . Alcohol abuse Maternal Grandfather   . Cancer Maternal Grandfather 90       stomach cancer   . Heart attack Neg Hx   . Stroke Neg Hx   . Breast cancer Neg Hx   . Colon cancer Neg Hx   . Prostate cancer Neg Hx   . Pancreatic cancer Neg Hx        Objective: Vitals:   03/29/20 1136  BP: 132/73  Pulse: 71  Temp: 97.7 F (36.5 C)     Physical Exam  Lab Results:  Results for orders placed or performed in visit on 03/29/20 (from the  past 24 hour(s))  Urinalysis, Routine w reflex microscopic     Status: Abnormal   Collection Time: 03/29/20 11:24 AM  Result Value Ref Range   Specific Gravity, UA 1.015 1.005 - 1.030   pH, UA 6.5 5.0 - 7.5   Color, UA Yellow Yellow   Appearance Ur Clear Clear   Leukocytes,UA Trace (A) Negative   Protein,UA 1+ (A) Negative/Trace   Glucose, UA Negative Negative   Ketones, UA Trace (A) Negative   RBC, UA 2+ (A) Negative   Bilirubin, UA Negative Negative   Urobilinogen, Ur 0.2 0.2 - 1.0 mg/dL   Nitrite, UA Negative Negative   Microscopic Examination See below:    Narrative   Performed at:  [facility] [address], [place], [place]  [id number] Lab Director: Mina Marble MT, Phone:  [phone]  Microscopic Examination     Status: Abnormal   Collection Time: 03/29/20 11:24 AM   Urine   Result Value Ref Range   WBC, UA 0-5 0 - 5 /hpf   RBC >30 (A) 0 - 2 /hpf   Epithelial Cells (non renal) 0-10 0 - 10 /hpf   Renal Epithel, UA None seen None seen /hpf   Bacteria, UA None seen None seen/Few   Narrative   Performed at:  [facility] [address] [place], [place]  [id number] Lab Director: [place], Phone:  [phone]    BMET No results for input(s): NA, K, CL, CO2, GLUCOSE, BUN, CREATININE, CALCIUM in the last 72 hours. PSA PSA  Date Value Ref Range Status  03/30/2019 10.4 (H) < OR = 4.0 ng/mL Final    Comment:    The total PSA value from this assay system is  standardized against the WHO standard. The test  result will be approximately 20% lower when compared  to the equimolar-standardized total PSA (Beckman  Coulter). Comparison of serial PSA results should be  interpreted with this fact in mind. . This test was performed using the Siemens  chemiluminescent method. Values obtained from  different assay methods cannot be used interchangeably. PSA levels, regardless of value, should not be interpreted as absolute evidence of the presence or absence of disease.   11/18/2013 5.0 (H) 0.0 - 4.0 ng/mL Final    Comment:    Roche ECLIA methodology. According to the American Urological Association, Serum PSA should decrease and remain at undetectable levels after radical prostatectomy. The AUA defines biochemical recurrence as an initial PSA value 0.2 ng/mL or greater followed by a subsequent confirmatory PSA value 0.2 ng/mL or greater. Values obtained with different assay methods or kits cannot be used interchangeably. Results cannot be interpreted as absolute evidence of the presence or absence of malignant disease.   05/20/2013 5.3 (H) 0.0 - 4.0 ng/mL Final    Comment:    Roche ECLIA methodology. According to the American Urological Association, Serum PSA should decrease and remain at undetectable levels after radical prostatectomy.  The AUA defines biochemical recurrence as an initial PSA value 0.2 ng/mL or greater followed by a subsequent confirmatory PSA value 0.2 ng/mL or greater. Values obtained with different assay methods or kits cannot be used interchangeably. Results cannot be interpreted as absolute evidence of the presence or absence of malignant disease.   No results found for: TESTOSTERONE  Recent Results (from the past 2160 hour(s))  Bayer DCA Hb A1c Waived     Status: None   Collection Time: 01/20/20  8:20 AM  Result Value Ref  Range   HB A1C (BAYER DCA - WAIVED) 6.3 <7.0 %    Comment:                                       Diabetic Adult            <7.0                                       Healthy Adult        4.3 - 5.7                                                           (DCCT/NGSP) American Diabetes Association's Summary of Glycemic Recommendations for Adults with Diabetes: Hemoglobin A1c <7.0%. More stringent glycemic goals (A1c <6.0%) may further reduce complications at the cost of increased risk of hypoglycemia.   Microalbumin / creatinine urine ratio     Status: None   Collection Time: 01/20/20  8:28 AM  Result Value Ref Range   Creatinine, Urine 52.6 Not Estab. mg/dL   Microalbumin, Urine <3.0 Not Estab. ug/mL   Microalb/Creat Ratio <6 0 - 29 mg/g creat    Comment:                        Normal:                0 -  29                        Moderately increased: 30 - 300                        Severely increased:       >300   Lipid panel     Status: Abnormal   Collection Time: 01/20/20  8:29 AM  Result Value Ref Range   Cholesterol, Total 119 100 - 199 mg/dL   Triglycerides 116 0 - 149 mg/dL   HDL 30 (L) >39 mg/dL   VLDL Cholesterol Cal 21 5 - 40 mg/dL   LDL Chol Calc (NIH) 68 0 - 99 mg/dL   Chol/HDL Ratio 4.0 0.0 - 5.0 ratio    Comment:                                   T. Chol/HDL Ratio                                             Men  Women                               1/2  Avg.Risk  3.4    3.3  Avg.Risk  5.0    4.4                                2X Avg.Risk  9.6    7.1                                3X Avg.Risk 23.4   11.0   CMP14+EGFR     Status: Abnormal   Collection Time: 01/20/20  8:29 AM  Result Value Ref Range   Glucose 104 (H) 65 - 99 mg/dL   BUN 18 8 - 27 mg/dL   Creatinine, Ser 1.40 (H) 0.76 - 1.27 mg/dL   GFR calc non Af Amer 49 (L) >59 mL/min/1.73   GFR calc Af Amer 57 (L) >59 mL/min/1.73    Comment: **In accordance with recommendations from the NKF-ASN Task force,**   [facility] is in the process of updating its eGFR calculation to the   2021 CKD-EPI creatinine equation that estimates kidney function   without a race variable.    BUN/Creatinine Ratio 13 10 - 24   Sodium 139 134 - 144 mmol/L   Potassium 4.8 3.5 - 5.2 mmol/L   Chloride 102 96 - 106 mmol/L   CO2 24 20 - 29 mmol/L   Calcium 9.4 8.6 - 10.2 mg/dL   Total Protein 6.7 6.0 - 8.5 g/dL   Albumin 4.2 3.7 - 4.7 g/dL   Globulin, Total 2.5 1.5 - 4.5 g/dL   Albumin/Globulin Ratio 1.7 1.2 - 2.2   Bilirubin Total 0.3 0.0 - 1.2 mg/dL   Alkaline Phosphatase 108 44 - 121 IU/L    Comment:               **Please note reference interval change**   AST 11 0 - 40 IU/L   ALT 20 0 - 44 IU/L  CBC with Differential/Platelet     Status: None   Collection Time: 01/20/20  8:29 AM  Result Value Ref Range   WBC 6.8 3.4 - 10.8 x10E3/uL   RBC 4.67 4.14 - 5.80 x10E6/uL   Hemoglobin 13.2 13.0 - 17.7 g/dL   Hematocrit 40.5 37.5 - 51.0 %   MCV 87 79 - 97 fL   MCH 28.3 26.6 - 33.0 pg   MCHC 32.6 31.5 - 35.7 g/dL   RDW 13.3 11.6 - 15.4 %   Platelets 214 150 - 450 x10E3/uL   Neutrophils 67 Not Estab. %   Lymphs 18 Not Estab. %   Monocytes 8 Not Estab. %   Eos 6 Not Estab. %   Basos 1 Not Estab. %   Neutrophils Absolute 4.6 1.4 - 7.0 x10E3/uL   Lymphocytes Absolute 1.2 0.7 - 3.1 x10E3/uL   Monocytes Absolute 0.5 0.1 - 0.9 x10E3/uL   EOS (ABSOLUTE) 0.4 0.0 - 0.4  x10E3/uL   Basophils Absolute 0.1 0.0 - 0.2 x10E3/uL   Immature Granulocytes 0 Not Estab. %   Immature Grans (Abs) 0.0 0.0 - 0.1 x10E3/uL  CBC     Status: None   Collection Time: 03/06/20  9:00 AM  Result Value Ref Range   WBC 6.4 4.0 - 10.5 K/uL   RBC 4.68 4.22 - 5.81 MIL/uL   Hemoglobin 13.7 13.0 - 17.0 g/dL   HCT 41.3 39.0 - 52.0 %   MCV 88.2 80.0 - 100.0 fL   MCH 29.3 26.0 - 34.0 pg  MCHC 33.2 30.0 - 36.0 g/dL   RDW 14.1 11.5 - 15.5 %   Platelets 161 150 - 400 K/uL   nRBC 0.0 0.0 - 0.2 %    Comment: Performed at [facility], [place] [address], [place], [place] [address]  Comprehensive metabolic panel     Status: Abnormal   Collection Time: 03/06/20  9:00 AM  Result Value Ref Range   Sodium 140 135 - 145 mmol/L   Potassium 4.3 3.5 - 5.1 mmol/L   Chloride 103 98 - 111 mmol/L   CO2 27 22 - 32 mmol/L   Glucose, Bld 153 (H) 70 - 99 mg/dL    Comment: Glucose reference range applies only to samples taken after fasting for at least 8 hours.   BUN 18 8 - 23 mg/dL   Creatinine, Ser 1.53 (H) 0.61 - 1.24 mg/dL   Calcium 9.4 8.9 - 10.3 mg/dL   Total Protein 7.2 6.5 - 8.1 g/dL   Albumin 4.1 3.5 - 5.0 g/dL   AST 15 15 - 41 U/L   ALT 18 0 - 44 U/L   Alkaline Phosphatase 77 38 - 126 U/L   Total Bilirubin 0.8 0.3 - 1.2 mg/dL   GFR, Estimated 48 (L) >60 mL/min    Comment: (NOTE) Calculated using the CKD-EPI Creatinine Equation (2021)    Anion gap 10 5 - 15    Comment: Performed at [facility], [place] [address], [place], [place] [address]  Protime-INR     Status: None   Collection Time: 03/06/20  9:00 AM  Result Value Ref Range   Prothrombin Time 12.8 11.4 - 15.2 seconds   INR 1.0 0.8 - 1.2    Comment: (NOTE) INR goal varies based on device and disease states. Performed at [facility], [place] [address], [place], [place] [address]   APTT     Status: None   Collection Time: 03/06/20  9:00 AM  Result Value Ref Range   aPTT 30  24 - 36 seconds    Comment: Performed at [facility] [place] [address] [place], [place] [address]  SARS CORONAVIRUS 2 (TAT 6-24 HRS) Nasopharyngeal Nasopharyngeal Swab     Status: None   Collection Time: 03/06/20  9:18 AM   Specimen: Nasopharyngeal Swab  Result Value Ref Range   SARS Coronavirus 2 NEGATIVE NEGATIVE    Comment: (NOTE) SARS-CoV-2 target nucleic acids are NOT DETECTED.  The SARS-CoV-2 RNA is generally detectable in upper and lower respiratory specimens during the acute phase of infection. Negative results do not preclude SARS-CoV-2 infection, do not rule out co-infections with other pathogens, and should not be used as the sole basis for treatment or other patient management decisions. Negative results must be combined with clinical observations, patient history, and epidemiological information. The expected result is Negative.  Fact Sheet for Patients: SugarRoll.be  Fact Sheet for Healthcare Providers: https://www.woods-mathews.com/  This test is not yet approved or cleared by the [place] FDA and  has been authorized for detection and/or diagnosis of SARS-CoV-2 by FDA under an Emergency Use Authorization (EUA). This EUA will remain  in effect (meaning this test can be used) for the duration of the COVID-19 declaration under Se ction 564(b)(1) of the Act, 21 U.S.C. section 360bbb-3(b)(1), unless the authorization is terminated or revoked sooner.  Performed at [facility], [place] [address], [place], [place] [address]   Glucose, capillary     Status: Abnormal   Collection Time:  03/09/20  6:21 AM  Result Value Ref Range   Glucose-Capillary 119 (H) 70 - 99 mg/dL    Comment: Glucose reference range applies only to samples taken after fasting for at least 8 hours.  Glucose, capillary     Status: Abnormal   Collection Time: 03/09/20  9:03 AM  Result Value Ref Range   Glucose-Capillary 131 (H) 70 -  99 mg/dL    Comment: Glucose reference range applies only to samples taken after fasting for at least 8 hours.  Urinalysis, Routine w reflex microscopic     Status: Abnormal   Collection Time: 03/29/20 11:24 AM  Result Value Ref Range   Specific Gravity, UA 1.015 1.005 - 1.030   pH, UA 6.5 5.0 - 7.5   Color, UA Yellow Yellow   Appearance Ur Clear Clear   Leukocytes,UA Trace (A) Negative   Protein,UA 1+ (A) Negative/Trace   Glucose, UA Negative Negative   Ketones, UA Trace (A) Negative   RBC, UA 2+ (A) Negative   Bilirubin, UA Negative Negative   Urobilinogen, Ur 0.2 0.2 - 1.0 mg/dL   Nitrite, UA Negative Negative   Microscopic Examination See below:   Microscopic Examination     Status: Abnormal   Collection Time: 03/29/20 11:24 AM   Urine  Result Value Ref Range   WBC, UA 0-5 0 - 5 /hpf   RBC >30 (A) 0 - 2 /hpf   Epithelial Cells (non renal) 0-10 0 - 10 /hpf   Renal Epithel, UA None seen None seen /hpf   Bacteria, UA None seen None seen/Few     Studies/Results: Path report reviewed.     Assessment & Plan: T2a Nx Mx Gleason 7(3+4) disease s/p seed implant.   He is doing well post op and will return in 3 months with a PSA. He felt a lump in the rectal area and I told him that was likely the spaceOAR and to try to avoid manipulating it.   Microhematuria.  This is post procedural and will just need monitoring.  Cystoscopy was normal at the seed implant.   No orders of the defined types were placed in this encounter.    Orders Placed This Encounter  Procedures  . Microscopic Examination  . Urinalysis, Routine w reflex microscopic  . PSA    Standing Status:   Future    Standing Expiration Date:   07/29/2020      Return in about 3 months (around 06/29/2020) for PSA on return. .   CC: [name], FNP      [name] 03/29/2020

## 2020-03-29 ENCOUNTER — Other Ambulatory Visit: Payer: Self-pay

## 2020-03-29 ENCOUNTER — Ambulatory Visit (INDEPENDENT_AMBULATORY_CARE_PROVIDER_SITE_OTHER): Payer: Medicare PPO | Admitting: Urology

## 2020-03-29 ENCOUNTER — Encounter: Payer: Self-pay | Admitting: Urology

## 2020-03-29 VITALS — BP 132/73 | HR 71 | Temp 97.7°F | Ht 72.0 in | Wt 235.0 lb

## 2020-03-29 DIAGNOSIS — C61 Malignant neoplasm of prostate: Secondary | ICD-10-CM

## 2020-03-29 DIAGNOSIS — R351 Nocturia: Secondary | ICD-10-CM

## 2020-03-29 DIAGNOSIS — N403 Nodular prostate with lower urinary tract symptoms: Secondary | ICD-10-CM

## 2020-03-29 DIAGNOSIS — R3129 Other microscopic hematuria: Secondary | ICD-10-CM

## 2020-03-29 LAB — URINALYSIS, ROUTINE W REFLEX MICROSCOPIC
Bilirubin, UA: NEGATIVE
Glucose, UA: NEGATIVE
Nitrite, UA: NEGATIVE
Specific Gravity, UA: 1.015 (ref 1.005–1.030)
Urobilinogen, Ur: 0.2 mg/dL (ref 0.2–1.0)
pH, UA: 6.5 (ref 5.0–7.5)

## 2020-03-29 LAB — MICROSCOPIC EXAMINATION
Bacteria, UA: NONE SEEN
RBC, Urine: >30 /hpf — AB (ref 0–2)
Renal Epithel, UA: NONE SEEN /hpf

## 2020-03-29 NOTE — Progress Notes (Signed)

## 2020-04-04 ENCOUNTER — Encounter: Payer: Self-pay | Admitting: Urology

## 2020-04-04 ENCOUNTER — Other Ambulatory Visit: Payer: Self-pay

## 2020-04-05 ENCOUNTER — Ambulatory Visit (INDEPENDENT_AMBULATORY_CARE_PROVIDER_SITE_OTHER): Payer: Medicare PPO

## 2020-04-05 ENCOUNTER — Other Ambulatory Visit: Payer: Self-pay | Admitting: Physician Assistant

## 2020-04-05 DIAGNOSIS — I714 Abdominal aortic aneurysm, without rupture, unspecified: Secondary | ICD-10-CM

## 2020-04-10 ENCOUNTER — Telehealth: Payer: Self-pay | Admitting: *Deleted

## 2020-04-10 NOTE — Telephone Encounter (Signed)
Called patient to remind of post seed appts. for 04-11-20, lvm for a return call

## 2020-04-11 ENCOUNTER — Ambulatory Visit
Admission: RE | Admit: 2020-04-11 | Discharge: 2020-04-11 | Disposition: A | Discharge status: Home / Self Care | Payer: Medicare PPO | Source: Ambulatory Visit | Attending: Urology | Admitting: Urology

## 2020-04-11 ENCOUNTER — Other Ambulatory Visit: Payer: Self-pay

## 2020-04-11 ENCOUNTER — Telehealth: Payer: Self-pay

## 2020-04-11 ENCOUNTER — Ambulatory Visit
Admission: RE | Admit: 2020-04-11 | Discharge: 2020-04-11 | Disposition: A | Discharge status: Home / Self Care | Payer: Medicare PPO | Source: Ambulatory Visit | Attending: Radiation Oncology | Admitting: Radiation Oncology

## 2020-04-11 VITALS — BP 160/81 | HR 65 | Temp 97.0°F | Resp 18

## 2020-04-11 DIAGNOSIS — C61 Malignant neoplasm of prostate: Secondary | ICD-10-CM | POA: Insufficient documentation

## 2020-04-11 NOTE — Progress Notes (Signed)
  Radiation Oncology         [phone] ________________________________  Name: Jacob Rios MRN: [id number]  Date: 04/11/2020  DOB: [birth year]  COMPLEX SIMULATION NOTE  NARRATIVE:  The patient was brought to the [facility] today following prostate seed implantation approximately one month ago.  Identity was confirmed.  All relevant records and images related to the planned course of therapy were reviewed.  Then, the patient was set-up supine.  CT images were obtained.  The CT images were loaded into the planning software.  Then the prostate and rectum were contoured.  Treatment planning then occurred.  The implanted iodine 125 seeds were identified by the physics staff for projection of radiation distribution  I have requested : 3D Simulation  I have requested a DVH of the following structures: Prostate and rectum.    ________________________________  [name] Tammi Klippel, M.D.

## 2020-04-11 NOTE — Progress Notes (Signed)
Radiation Oncology         [phone] ________________________________  Name: Jacob Rios MRN: [id number]  Date: 04/11/2020  DOB: 1945-11-27  Post-Seed Follow-Up Visit Note  CC: Jacob Pretty, FNP  Jacob Seal, MD  Diagnosis:   75 y.o.gentleman with Stage T2aadenocarcinoma of the prostate with Gleason score of 3+4, and PSA of13.3.    ICD-10-CM   1. Malignant neoplasm of prostate (HCC)  C61     Interval Since Last Radiation:  4.5 weeks 03/09/20:  Insertion of radioactive I-125 seeds into the prostate gland; 145 Gy, definitive therapy with placement of SpaceOAR VUE gel.  Narrative:  The patient returns today for routine follow-up.  He is complaining of increased urinary frequency and urinary hesitation symptoms. He filled out a questionnaire regarding urinary function today providing and overall IPSS score of 9 characterizing his symptoms as moderate with nocturia x3 and intermittency with occasional mild dysuria at the start of his stream.  His pre-implant score was 7.  He specifically denies gross hematuria, straining to void, incomplete bladder emptying or incontinence.  He denies any abdominal pain or bowel symptoms.  Overall, he is quite pleased with his progress to date.  ALLERGIES:  is allergic to morphine.  Meds: Current Outpatient Medications  Medication Sig Dispense Refill  . aspirin 81 MG tablet Take 81 mg by mouth daily.    Marland Kitchen atorvastatin (LIPITOR) 40 MG tablet Take 1 tablet by mouth once daily (Patient taking differently: Take 40 mg by mouth at bedtime.) 90 tablet 1  . Cholecalciferol (D3 ADULT PO) Take 1 tablet by mouth daily.    Marland Kitchen CRANBERRY PO Take by mouth daily.    Marland Kitchen glipiZIDE (GLUCOTROL XL) 10 MG 24 hr tablet Take 1 tablet (10 mg total) by mouth daily. (Patient taking differently: Take 10 mg by mouth daily.) 90 tablet 1  . glucose blood (ONETOUCH VERIO) test strip Test 1X per day and as needed  Dx 250.02 100 each 12  . HYDROcodone-acetaminophen  (NORCO/VICODIN) 5-325 MG tablet Take 1 tablet by mouth every 6 (six) hours as needed for moderate pain. 6 tablet 0  . Lancets (ONETOUCH ULTRASOFT) lancets Patient test 1X per day and prn  Dx 250.02 100 each 12  . lisinopril (ZESTRIL) 2.5 MG tablet Take 1 tablet (2.5 mg total) by mouth daily. (Patient taking differently: Take 2.5 mg by mouth daily.) 90 tablet 1  . metFORMIN (GLUCOPHAGE) 1000 MG tablet Take 1 tablet (1,000 mg total) by mouth 2 (two) times daily with a meal. (Patient taking differently: Take 1,000 mg by mouth 2 (two) times daily with a meal.) 180 tablet 1  . metoprolol succinate (TOPROL-XL) 50 MG 24 hr tablet Take 1 tablet (50 mg total) by mouth daily. Take with or immediately following a meal. 90 tablet 1  . omeprazole (PRILOSEC) 40 MG capsule Take 1 capsule (40 mg total) by mouth daily. (Patient taking differently: Take 40 mg by mouth daily.) 90 capsule 1  . Phenyleph-Doxylamine-DM-APAP (ALKA-SELTZER PLS ALLERGY & CGH PO) Take 1 tablet by mouth once. As needed    . vitamin C (ASCORBIC ACID) 500 MG tablet Take 500 mg by mouth daily.     No current facility-administered medications for this encounter.    Physical Findings: In general this is a well appearing Caucasian male in no acute distress. He's alert and oriented x4 and appropriate throughout the examination. Cardiopulmonary assessment is negative for acute distress and he exhibits normal effort.   Lab Findings: Lab Results  Component  Value Date   WBC 6.4 03/06/2020   HGB 13.7 03/06/2020   HCT 41.3 03/06/2020   MCV 88.2 03/06/2020   PLT 161 03/06/2020    Radiographic Findings:  Patient underwent CT imaging in our clinic for post implant dosimetry. The CT will be reviewed by Dr. Tammi Klippel to confirm there is an adequate distribution of radioactive seeds throughout the prostate gland and ensure that there are no seeds in or near the rectum. . We suspect the final radiation plan and dosimetry will show appropriate coverage of  the prostate gland. He understands that we will call and inform him of any unexpected findings on further review of his imaging and dosimetry.  Impression/Plan: 75 y.o.gentleman with Stage T2aadenocarcinoma of the prostate with Gleason score of 3+4, and PSA of13.3. The patient is recovering from the effects of radiation. His urinary symptoms should gradually improve over the next 4-6 months. We talked about this today. He is encouraged by his improvement already and is otherwise pleased with his outcome. We also talked about long-term follow-up for prostate cancer following seed implant. He understands that ongoing PSA determinations and digital rectal exams will help perform surveillance to rule out disease recurrence. He had a recent follow-up visit with Dr. Jeffie Pollock last week and has his next follow up appointment scheduled with Dr. Jeffie Pollock in June 2022. He understands what to expect with his PSA measures. Patient was also educated today about some of the long-term effects from radiation including a small risk for rectal bleeding and possibly erectile dysfunction. We talked about some of the general management approaches to these potential complications. However, I did encourage the patient to contact our office or return at any point if he has questions or concerns related to his previous radiation and prostate cancer.    Jacob Johns, PA-C

## 2020-04-11 NOTE — Telephone Encounter (Signed)
Spoke with patient on the 23rd in regards to telephone visit with Freeman Caldron PA on 04/11/20 @ 10:30am. Called to review meaningful use questions. TM

## 2020-04-15 ENCOUNTER — Other Ambulatory Visit: Payer: Self-pay | Admitting: Nurse Practitioner

## 2020-04-15 DIAGNOSIS — E119 Type 2 diabetes mellitus without complications: Secondary | ICD-10-CM

## 2020-05-22 ENCOUNTER — Encounter: Payer: Self-pay | Admitting: Radiation Oncology

## 2020-05-22 ENCOUNTER — Ambulatory Visit
Admission: RE | Admit: 2020-05-22 | Discharge: 2020-05-22 | Disposition: A | Discharge status: Home / Self Care | Payer: Medicare PPO | Source: Ambulatory Visit | Attending: Radiation Oncology | Admitting: Radiation Oncology

## 2020-05-22 DIAGNOSIS — C61 Malignant neoplasm of prostate: Secondary | ICD-10-CM | POA: Insufficient documentation

## 2020-05-22 NOTE — Progress Notes (Signed)
  Radiation Oncology         [phone] ________________________________  Name: Jacob Rios MRN: [id number]  Date: 05/22/2020  DOB: 1946-01-06  3D Planning Note   Prostate Brachytherapy Post-Implant Dosimetry  Diagnosis: 75 y.o. gentleman with Stage T2a adenocarcinoma of the prostate with Gleason score of 3+4, and PSA of 13.3.  Narrative: On a previous date, [name] returned following prostate seed implantation for post implant planning. He underwent CT scan complex simulation to delineate the three-dimensional structures of the pelvis and demonstrate the radiation distribution.  Since that time, the seed localization, and complex isodose planning with dose volume histograms have now been completed.  Results:   Prostate Coverage - The dose of radiation delivered to the 90% or more of the prostate gland (D90) was 90.5% of the prescription dose. This exceeds our goal of greater than 90%. Rectal Sparing - The volume of rectal tissue receiving the prescription dose or higher was 0.0 cc. This falls under our thresholds tolerance of 1.0 cc.  Impression: The prostate seed implant appears to show adequate target coverage and appropriate rectal sparing.  Plan:  The patient will continue to follow with urology for ongoing PSA determinations. I would anticipate a high likelihood for local tumor control with minimal risk for rectal morbidity.  ________________________________  [name] Tammi Klippel, M.D.

## 2020-06-21 ENCOUNTER — Ambulatory Visit (INDEPENDENT_AMBULATORY_CARE_PROVIDER_SITE_OTHER): Payer: Medicare PPO

## 2020-06-21 DIAGNOSIS — Z0000 Encounter for general adult medical examination without abnormal findings: Secondary | ICD-10-CM | POA: Diagnosis not present

## 2020-06-21 NOTE — Progress Notes (Addendum)
Subjective:   Jacob Rios is a 75 y.o. male who presents for Medicare Annual/Subsequent preventive examination.  Virtual Visit via Telephone Note  I connected with  Jacob Rios on 06/21/20 at  9:00 AM EDT by telephone and verified that I am speaking with the correct person using two identifiers.  Location: Patient: Home Provider: [facility] Persons participating in the virtual visit: patient/Nurse Health Advisor   I discussed the limitations, risks, security and privacy concerns of performing an evaluation and management service by telephone and the availability of in person appointments. The patient expressed understanding and agreed to proceed.  Interactive audio and video telecommunications were attempted between this nurse and patient, however failed, due to patient having technical difficulties OR patient did not have access to video capability.  We continued and completed visit with audio only.  Some vital signs may be absent or patient reported.   Lanna Labella E Lourine Alberico, LPN   Review of Systems     Cardiac Risk Factors include: advanced age (>63men, >53 women);diabetes mellitus;obesity (BMI >30kg/m2);hypertension;dyslipidemia     Objective:    There were no vitals filed for this visit. There is no height or weight on file to calculate BMI.  Advanced Directives 06/21/2020 04/04/2020 03/09/2020 12/20/2019 06/21/2019 07/15/2018 05/27/2018  Does Patient Have a Medical Advance Directive? Yes Yes No No Yes No No  Type of Paramedic of [place];Living will [place];Living will - - Living will - -  Does patient want to make changes to medical advance directive? - - - - No - Patient declined - -  Copy of [place] in Chart? No - copy requested - - - - - -  Would patient like information on creating a medical advance directive? - - Yes (MAU/Ambulatory/Procedural Areas - Information given) No - Patient declined - - No - Patient declined     Current Medications (verified) Outpatient Encounter Medications as of 06/21/2020  Medication Sig   aspirin 81 MG tablet Take 81 mg by mouth daily.   atorvastatin (LIPITOR) 40 MG tablet Take 1 tablet by mouth once daily (Patient taking differently: Take 40 mg by mouth at bedtime.)   Cholecalciferol (D3 ADULT PO) Take 1 tablet by mouth daily.   CRANBERRY PO Take by mouth daily.   glipiZIDE (GLUCOTROL XL) 10 MG 24 hr tablet Take 1 tablet by mouth once daily   glucose blood (ONETOUCH VERIO) test strip Test 1X per day and as needed  Dx 250.02   HYDROcodone-acetaminophen (NORCO/VICODIN) 5-325 MG tablet Take 1 tablet by mouth every 6 (six) hours as needed for moderate pain.   Lancets (ONETOUCH ULTRASOFT) lancets Patient test 1X per day and prn  Dx 250.02   lisinopril (ZESTRIL) 2.5 MG tablet Take 1 tablet (2.5 mg total) by mouth daily. (Patient taking differently: Take 2.5 mg by mouth daily.)   metFORMIN (GLUCOPHAGE) 1000 MG tablet Take 1 tablet (1,000 mg total) by mouth 2 (two) times daily with a meal. (Patient taking differently: Take 1,000 mg by mouth 2 (two) times daily with a meal.)   metoprolol succinate (TOPROL-XL) 50 MG 24 hr tablet Take 1 tablet (50 mg total) by mouth daily. Take with or immediately following a meal.   omeprazole (PRILOSEC) 40 MG capsule Take 1 capsule (40 mg total) by mouth daily. (Patient taking differently: Take 40 mg by mouth daily.)   Phenyleph-Doxylamine-DM-APAP (ALKA-SELTZER PLS ALLERGY & CGH PO) Take 1 tablet by mouth once. As needed   vitamin  C (ASCORBIC ACID) 500 MG tablet Take 500 mg by mouth daily.   No facility-administered encounter medications on file as of 06/21/2020.    Allergies (verified) Morphine   History: Past Medical History:  Diagnosis Date   CKD (chronic kidney disease), stage III ([place])    followed by pcp   Coronary artery disease cardiologist--- dr Angelena Form   08/ 1999  s/p  cath w/ PTCA and stenting to RCA;   05/ 2000 inferior wall MI ,  06-06-1998 s/p cabg x3;   Last heart cath 2005,  2 patent grafts (diagnol and RCA) and occluded LIMA--LAD graft with normal LAD nonobstructive disease;  last nuclear study 11/ 2014 no evidence ishcemia, ef 40%   Full dentures    Hiatal hernia    History of acute inferior wall MI 05/1998   s/p  cabg   Hypertension    Mixed hyperlipidemia    Nocturia more than twice per night    PAD (peripheral artery disease) (HCC)    left common iliac artery stenosis per aorta ultrasound 03/ 2021 in epic and cardiology note   Prostate cancer [facility]) urologist--- dr Jeffie Pollock   first dx 07/ 2015 in active survillance until bx 11-11-2019,  Stage T2a, Gleason 3+4, PSA 13.3   S/P CABG x 3 06/06/1998   LIMA--LAD, SVG to Diagonal, RIMA to RCA   S/P primary angioplasty with coronary stent 08/1997   stent to RCA   Type 2 diabetes mellitus ([place])    followed by pcp  (03-06-2020 checks blood sugar dialy in am,  fasting sugar-- 110-130)   Past Surgical History:  Procedure Laterality Date   APPENDECTOMY  child   CARDIAC CATHETERIZATION  09/01/2000  @[facility]    patent RCA and Diagonal grafts, atretic LIMA, moderate nonobstructive proxLAD, normal lvsf   CARDIAC CATHETERIZATION  04-21-2003  @[facility]    LIMA--LAD graft occluded, native LAD with nonobstructive disease,  patent diagonal/ rca grafts   CATARACT EXTRACTION W/ INTRAOCULAR LENS IMPLANT Right 12/2018   CORONARY ANGIOPLASTY WITH STENT PLACEMENT  08/1997  @[facility]    ptca w/ stenting to rca   CORONARY ARTERY BYPASS GRAFT  06-06-1998  @[facility]     LIMA -- LAD, SVG -- Diagonal,  RIMA to RCA   CYSTOSCOPY N/A 03/09/2020   Procedure: CYSTOSCOPY FLEXIBLE;  Surgeon: [name], MD;  Location[facility];  Service: Urology;  Laterality: N/A;  NO SEEDS FOUND IN BLADDER   INGUINAL HERNIA REPAIR Right 1974   PROSTATE BIOPSY  06/2017   x 4-5    RADIOACTIVE SEED IMPLANT N/A 03/09/2020   Procedure: RADIOACTIVE SEED IMPLANT/BRACHYTHERAPY IMPLANT;  Surgeon: [name], MD;  [facility];  Service: Urology;  Laterality: N/A;   88  SEEDS IMPLANTED   SPACE OAR INSTILLATION N/A 03/09/2020   Procedure: SPACE OAR INSTILLATION;  Surgeon: [name], MD;  Location: [facility];  Service: Urology;  Laterality: N/A;   VENTRAL HERNIA REPAIR  09/ 2000 and recurrent repair 06/ 2001   Family History  Problem Relation Age of Onset   Lung cancer Mother    Dementia Father    Hyperlipidemia Father    Congestive Heart Failure Father    Post-traumatic stress disorder Son    Alcohol abuse Maternal Grandfather    Cancer Maternal Grandfather 90       stomach cancer    Heart attack Neg Hx    Stroke Neg Hx    Breast cancer Neg Hx    Colon cancer Neg Hx  Prostate cancer Neg Hx    Pancreatic cancer Neg Hx    Social History   Socioeconomic History   Marital status: Married    Spouse name: Vaughan Basta    Number of children: 3   Years of education: Not on file   Highest education level: Not on file  Occupational History   Occupation: part-time trucker    Comment: retired   Tobacco Use   Smoking status: Former    Packs/day: 4.00    Years: 45.00    Pack years: 180.00    Types: Cigarettes    Quit date: 01/13/1997    Years since quitting: 23.4   Smokeless tobacco: Never   Tobacco comments:    reports he began smoking at age 64  Vaping Use   Vaping Use: Never used  Substance and Sexual Activity   Alcohol use: No   Drug use: Never   Sexual activity: Not on file  Other Topics Concern   Not on file  Social History Narrative   Lives home with wife   Social Determinants of Health   Financial Resource Strain: Low Risk    Difficulty of Paying Living Expenses: Not very hard  Food Insecurity: No Food Insecurity   Worried About Charity fundraiser in the Last Year: Never true   [place] in the Last Year: Never true  Transportation Needs: No Transportation Needs   Lack of Transportation (Medical): No   Lack of Transportation (Non-Medical):  No  Physical Activity: Sufficiently Active   Days of Exercise per Week: 7 days   Minutes of Exercise per Session: 30 min  Stress: No Stress Concern Present   Feeling of Stress : Only a little  Social Connections: Engineer, building services of Communication with Friends and Family: More than three times a week   Frequency of Social Gatherings with Friends and Family: More than three times a week   Attends Religious Services: More than 4 times per year   Active Member of [employer] or Organizations: Yes   Attends Music therapist: More than 4 times per year   Marital Status: Married    Tobacco Counseling Counseling given: Not Answered Tobacco comments: reports he began smoking at age 70   Clinical Intake:  Pre-visit preparation completed: Yes  Pain : No/denies pain     BMI - recorded: 31.87 Nutritional Status: BMI > 30  Obese Nutritional Risks: None Diabetes: Yes CBG done?: No Did pt. bring in CBG monitor from home?: No  How often do you need to have someone help you when you read instructions, pamphlets, or other written materials from your doctor or pharmacy?: 1 - Never  Nutrition Risk Assessment:  Has the patient had any N/V/D within the last 2 months?  No  Does the patient have any non-healing wounds?  No  Has the patient had any unintentional weight loss or weight gain?  No   Diabetes:  Is the patient diabetic?  Yes  If diabetic, was a CBG obtained today?  No  Did the patient bring in their glucometer from home?  No  How often do you monitor your CBG's? BID usually. This am 120 per patient.   Financial Strains and Diabetes Management:  Are you having any financial strains with the device, your supplies or your medication? No .  Does the patient want to be seen by Chronic Care Management for management of their diabetes?  No  Would the patient like to be referred  to a Nutritionist or for Diabetic Management?  Yes   Diabetic Exams:  Diabetic Eye  Exam: Completed 08/24/2019.   Diabetic Foot Exam: Completed 01/20/2020. Pt has been advised about the importance in completing this exam. Pt is scheduled for diabetic foot exam on 07/2020.    Interpreter Needed?: No  Information entered by :: Yazlynn Birkeland, LPN   Activities of Daily Living In your present state of health, do you have any difficulty performing the following activities: 06/21/2020 03/09/2020  Hearing? N Y  Vision? N N  Difficulty concentrating or making decisions? N N  Walking or climbing stairs? N N  Dressing or bathing? N N  Doing errands, shopping? N -  Preparing Food and eating ? N -  Using the Toilet? N -  In the past six months, have you accidently leaked urine? Y -  Comment better now. has prostate cancer - f/u with Dr Jeffie Pollock -  Do you have problems with loss of bowel control? N -  Managing your Medications? N -  Managing your Finances? N -  Housekeeping or managing your Housekeeping? N -  Some recent data might be hidden    Patient Care Team: [name], FNP as PCP - General (Family Medicine) Burnell Blanks, MD as PCP - Cardiology (Cardiology) [name], MD as Attending Physician (Urology) Harlen Labs, MD as Referring Physician (Optometry) Cira Rue, RN as Oncology Nurse Navigator  Indicate any recent Medical Services you may have received from other than [facility] providers in the past year (date may be approximate).     Assessment:   This is a routine wellness examination for Joandry.  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 - was told hearing aids likely wouldn't help Vision Screening - Comments:: Wears reading glasses prn - up to date with annual eye exam with Dr Marin Comment in [place]  Dietary issues and exercise activities discussed: Current Exercise Habits: Home exercise routine, Type of exercise: walking, Time (Minutes): 30,  Frequency (Times/Week): 7, Weekly Exercise (Minutes/Week): 210, Intensity: Mild, Exercise limited by: cardiac condition(s);orthopedic condition(s)   Goals Addressed             This Visit's Progress    Prevent falls   On track    Stay active         Depression Screen [facility] 2/9 Scores 06/21/2020 10/17/2019 07/08/2019 06/21/2019 04/07/2019 12/21/2018 09/21/2018  PHQ - 2 Score 0 0 0 0 0 0 0  PHQ- 9 Score - - - - - - -    Fall Risk Fall Risk  06/21/2020 01/20/2020 10/17/2019 07/08/2019 04/07/2019  Falls in the past year? 0 0 0 0 0  Number falls in past yr: 0 - - - -  Injury with Fall? 0 - - - -  Risk for fall due to : Orthopedic patient;Medication side effect - - - -  Follow up Falls prevention discussed - - - -    FALL RISK PREVENTION PERTAINING TO THE HOME:  Any stairs in or around the home? Yes  If so, are there any without handrails? No  Home free of loose throw rugs in walkways, pet beds, electrical cords, etc? Yes  Adequate lighting in your home to reduce risk of falls? Yes   ASSISTIVE DEVICES UTILIZED TO PREVENT FALLS:  Life alert? No  Use of a cane, walker or w/c? No  Grab bars in the bathroom?  Yes  Shower chair or bench in shower? Yes  Elevated toilet seat or a handicapped toilet? Yes   TIMED UP AND GO:  Was the test performed? No . Telephonic visit.  Cognitive Function: Normal cognitive status assessed by direct observation by this Nurse Health Advisor. No abnormalities found.      6CIT Screen 06/21/2019 05/27/2018  What Year? 0 points 0 points  What month? 0 points 0 points  What time? 0 points 0 points  Count back from 20 0 points 0 points  Months in reverse 0 points 0 points  Repeat phrase 0 points 0 points  Total Score 0 0    Immunizations Immunization History  Administered Date(s) Administered   Fluad Quad(high Dose 65+) 10/26/2018, 11/01/2019   H1N1 11/10/2007   Influenza, High Dose Seasonal PF 10/26/2017   Influenza,inj,Quad PF,6+ Mos 11/03/2012,  10/24/2013, 10/16/2014, 10/16/2015, 10/20/2016   Moderna Sars-Covid-2 Vaccination 02/03/2019, 03/11/2019   Pneumococcal Conjugate-13 07/20/2014   Pneumococcal Polysaccharide-23 07/30/2012   Tdap 07/30/2012   Zoster Recombinat (Shingrix) 07/26/2019, 10/26/2019    TDAP status: Up to date  Flu Vaccine status: Up to date  Pneumococcal vaccine status: Up to date  Covid-19 vaccine status: Completed vaccines  Qualifies for Shingles Vaccine? Yes   Zostavax completed Yes   Shingrix Completed?: Yes  Screening Tests Health Maintenance  Topic Date Due   Pneumococcal Vaccine 50-46 Years old (1 - PCV) Never done   COVID-19 Vaccine (3 - Moderna risk series) 04/08/2019   COLONOSCOPY (Pts 45-61yrs Insurance coverage will need to be confirmed)  07/07/2020 (Originally 09/24/1991)   HEMOGLOBIN A1C  07/19/2020   INFLUENZA VACCINE  08/13/2020   OPHTHALMOLOGY EXAM  08/23/2020   FOOT EXAM  01/19/2021   TETANUS/TDAP  07/31/2022   Hepatitis C Screening  Completed   PNA vac Low Risk Adult  Completed   Zoster Vaccines- Shingrix  Completed   HPV VACCINES  Aged Out    Health Maintenance  Health Maintenance Due  Topic Date Due   Pneumococcal Vaccine 61-100 Years old (1 - PCV) Never done   COVID-19 Vaccine (3 - Moderna risk series) 04/08/2019    Colorectal cancer screening: No longer required.  This patient declined  Lung Cancer Screening: (Low Dose CT Chest recommended if Age 3-80 years, 30 pack-year currently smoking OR have quit w/in 15years.) does not qualify.   Additional Screening:  Hepatitis C Screening: does qualify; Completed 02/19/2015  Vision Screening: Recommended annual ophthalmology exams for early detection of glaucoma and other disorders of the eye. Is the patient up to date with their annual eye exam?  Yes  Who is the provider or what is the name of the office in which the patient attends annual eye exams? Dr Marin Comment in [place] If pt is not established with a provider, would they like  to be referred to a provider to establish care? No .   Dental Screening: Recommended annual dental exams for proper oral hygiene  Community Resource Referral / Chronic Care Management: CRR required this visit?  No   CCM required this visit?  No      Plan:     I have personally reviewed and noted the following in the patient's chart:   Medical and social history Use of alcohol, tobacco or illicit drugs  Current medications and supplements including opioid prescriptions. Patient is currently taking opioid prescriptions. Information provided to patient regarding non-opioid alternatives. Patient advised to discuss non-opioid treatment plan with their provider. Functional ability and status Nutritional status Physical  activity Advanced directives List of other physicians Hospitalizations, surgeries, and ER visits in previous 12 months Vitals Screenings to include cognitive, depression, and falls Referrals and appointments  In addition, I have reviewed and discussed with patient certain preventive protocols, quality metrics, and best practice recommendations. A written personalized care plan for preventive services as well as general preventive health recommendations were provided to patient.     Sandrea Hammond, LPN   [id number]/02/7251   Nurse Notes: None  I have read nurse Joncarlo Friberg' note and agree with her assessment and plan.  Patient to continue following with PCP for ongoing chronic medical problems.  Ashly M. Lajuana Ripple, [facility]

## 2020-06-21 NOTE — Patient Instructions (Signed)
Jacob Rios , Thank you for taking time to come for your Medicare Wellness Visit. I appreciate your ongoing commitment to your health goals. Please review the following plan we discussed and let me know if I can assist you in the future.   Screening recommendations/referrals: Colonoscopy: Declined Recommended yearly ophthalmology/optometry visit for glaucoma screening and checkup Recommended yearly dental visit for hygiene and checkup  Vaccinations: Influenza vaccine: Done 11/01/2019 - Repeat annually Pneumococcal vaccine: Done 07/30/2012 & 07/20/2014 Tdap vaccine: Done 07/30/2012 - Repeat in 10 years Shingles vaccine: Done 07/26/2019 & 10/26/2019   Covid-19: Done 02/03/19, 03/11/2019 - had one booster - we need dte  Advanced directives: Please bring a copy of your health care power of attorney and living will to the office to be added to your chart at your convenience.  Conditions/risks identified: Aim for 30 minutes of exercise or brisk walking each day, drink 6-8 glasses of water and eat lots of fruits and vegetables.  Next appointment: Follow up in one year for your annual wellness visit.   Preventive Care 18 Years and Older, Male  Preventive care refers to lifestyle choices and visits with your health care provider that can promote health and wellness. What does preventive care include? A yearly physical exam. This is also called an annual well check. Dental exams once or twice a year. Routine eye exams. Ask your health care provider how often you should have your eyes checked. Personal lifestyle choices, including: Daily care of your teeth and gums. Regular physical activity. Eating a healthy diet. Avoiding tobacco and drug use. Limiting alcohol use. Practicing safe sex. Taking low doses of aspirin every day. Taking vitamin and mineral supplements as recommended by your health care provider. What happens during an annual well check? The services and screenings done by your health  care provider during your annual well check will depend on your age, overall health, lifestyle risk factors, and family history of disease. Counseling  Your health care provider may ask you questions about your: Alcohol use. Tobacco use. Drug use. Emotional well-being. Home and relationship well-being. Sexual activity. Eating habits. History of falls. Memory and ability to understand (cognition). Work and work Statistician. Screening  You may have the following tests or measurements: Height, weight, and BMI. Blood pressure. Lipid and cholesterol levels. These may be checked every 5 years, or more frequently if you are over 26 years old. Skin check. Lung cancer screening. You may have this screening every year starting at age 54 if you have a 30-pack-year history of smoking and currently smoke or have quit within the past 15 years. Fecal occult blood test (FOBT) of the stool. You may have this test every year starting at age 55. Flexible sigmoidoscopy or colonoscopy. You may have a sigmoidoscopy every 5 years or a colonoscopy every 10 years starting at age 46. Prostate cancer screening. Recommendations will vary depending on your family history and other risks. Hepatitis C blood test. Hepatitis B blood test. Sexually transmitted disease (STD) testing. Diabetes screening. This is done by checking your blood sugar (glucose) after you have not eaten for a while (fasting). You may have this done every 1-3 years. Abdominal aortic aneurysm (AAA) screening. You may need this if you are a current or former smoker. Osteoporosis. You may be screened starting at age 90 if you are at high risk. Talk with your health care provider about your test results, treatment options, and if necessary, the need for more tests. Vaccines  Your health care provider  may recommend certain vaccines, such as: Influenza vaccine. This is recommended every year. Tetanus, diphtheria, and acellular pertussis (Tdap, Td)  vaccine. You may need a Td booster every 10 years. Zoster vaccine. You may need this after age 68. Pneumococcal 13-valent conjugate (PCV13) vaccine. One dose is recommended after age 53. Pneumococcal polysaccharide (PPSV23) vaccine. One dose is recommended after age 41. Talk to your health care provider about which screenings and vaccines you need and how often you need them. This information is not intended to replace advice given to you by your health care provider. Make sure you discuss any questions you have with your health care provider. Document Released: 01/26/2015 Document Revised: 09/19/2015 Document Reviewed: 10/31/2014 Elsevier Interactive Patient Education  2017 [place] Prevention in the Home Falls can cause injuries. They can happen to people of all ages. There are many things you can do to make your home safe and to help prevent falls. What can I do on the outside of my home? Regularly fix the edges of walkways and driveways and fix any cracks. Remove anything that might make you trip as you walk through a door, such as a raised step or threshold. Trim any bushes or trees on the path to your home. Use bright outdoor lighting. Clear any walking paths of anything that might make someone trip, such as rocks or tools. Regularly check to see if handrails are loose or broken. Make sure that both sides of any steps have handrails. Any raised decks and porches should have guardrails on the edges. Have any leaves, snow, or ice cleared regularly. Use sand or salt on walking paths during winter. Clean up any spills in your garage right away. This includes oil or grease spills. What can I do in the bathroom? Use night lights. Install grab bars by the toilet and in the tub and shower. Do not use towel bars as grab bars. Use non-skid mats or decals in the tub or shower. If you need to sit down in the shower, use a plastic, non-slip stool. Keep the floor dry. Clean up any  water that spills on the floor as soon as it happens. Remove soap buildup in the tub or shower regularly. Attach bath mats securely with double-sided non-slip rug tape. Do not have throw rugs and other things on the floor that can make you trip. What can I do in the bedroom? Use night lights. Make sure that you have a light by your bed that is easy to reach. Do not use any sheets or blankets that are too big for your bed. They should not hang down onto the floor. Have a firm chair that has side arms. You can use this for support while you get dressed. Do not have throw rugs and other things on the floor that can make you trip. What can I do in the kitchen? Clean up any spills right away. Avoid walking on wet floors. Keep items that you use a lot in easy-to-reach places. If you need to reach something above you, use a strong step stool that has a grab bar. Keep electrical cords out of the way. Do not use floor polish or wax that makes floors slippery. If you must use wax, use non-skid floor wax. Do not have throw rugs and other things on the floor that can make you trip. What can I do with my stairs? Do not leave any items on the stairs. Make sure that there are handrails on both sides  of the stairs and use them. Fix handrails that are broken or loose. Make sure that handrails are as long as the stairways. Check any carpeting to make sure that it is firmly attached to the stairs. Fix any carpet that is loose or worn. Avoid having throw rugs at the top or bottom of the stairs. If you do have throw rugs, attach them to the floor with carpet tape. Make sure that you have a light switch at the top of the stairs and the bottom of the stairs. If you do not have them, ask someone to add them for you. What else can I do to help prevent falls? Wear shoes that: Do not have high heels. Have rubber bottoms. Are comfortable and fit you well. Are closed at the toe. Do not wear sandals. If you use a  stepladder: Make sure that it is fully opened. Do not climb a closed stepladder. Make sure that both sides of the stepladder are locked into place. Ask someone to hold it for you, if possible. Clearly mark and make sure that you can see: Any grab bars or handrails. First and last steps. Where the edge of each step is. Use tools that help you move around (mobility aids) if they are needed. These include: Canes. Walkers. Scooters. Crutches. Turn on the lights when you go into a dark area. Replace any light bulbs as soon as they burn out. Set up your furniture so you have a clear path. Avoid moving your furniture around. If any of your floors are uneven, fix them. If there are any pets around you, be aware of where they are. Review your medicines with your doctor. Some medicines can make you feel dizzy. This can increase your chance of falling. Ask your doctor what other things that you can do to help prevent falls. This information is not intended to replace advice given to you by your health care provider. Make sure you discuss any questions you have with your health care provider. Document Released: 10/26/2008 Document Revised: 06/07/2015 Document Reviewed: 02/03/2014 Elsevier Interactive Patient Education  2017 [employer].

## 2020-06-25 DIAGNOSIS — H40033 Anatomical narrow angle, bilateral: Secondary | ICD-10-CM | POA: Diagnosis not present

## 2020-06-25 DIAGNOSIS — H10013 Acute follicular conjunctivitis, bilateral: Secondary | ICD-10-CM | POA: Diagnosis not present

## 2020-06-28 ENCOUNTER — Other Ambulatory Visit: Payer: Self-pay

## 2020-06-28 ENCOUNTER — Other Ambulatory Visit: Payer: Medicare PPO

## 2020-06-28 DIAGNOSIS — C61 Malignant neoplasm of prostate: Secondary | ICD-10-CM | POA: Diagnosis not present

## 2020-06-29 LAB — PSA: Prostate Specific Ag, Serum: 3 ng/mL (ref 0.0–4.0)

## 2020-07-05 ENCOUNTER — Encounter: Payer: Self-pay | Admitting: Urology

## 2020-07-05 ENCOUNTER — Ambulatory Visit (INDEPENDENT_AMBULATORY_CARE_PROVIDER_SITE_OTHER): Payer: Medicare PPO | Admitting: Urology

## 2020-07-05 ENCOUNTER — Other Ambulatory Visit: Payer: Self-pay

## 2020-07-05 VITALS — BP 139/86 | HR 84 | Temp 97.9°F

## 2020-07-05 DIAGNOSIS — C61 Malignant neoplasm of prostate: Secondary | ICD-10-CM | POA: Diagnosis not present

## 2020-07-05 DIAGNOSIS — R351 Nocturia: Secondary | ICD-10-CM | POA: Diagnosis not present

## 2020-07-05 DIAGNOSIS — R972 Elevated prostate specific antigen [PSA]: Secondary | ICD-10-CM

## 2020-07-05 DIAGNOSIS — N403 Nodular prostate with lower urinary tract symptoms: Secondary | ICD-10-CM | POA: Diagnosis not present

## 2020-07-05 LAB — MICROSCOPIC EXAMINATION
Epithelial Cells (non renal): NONE SEEN /hpf (ref 0–10)
RBC, Urine: NONE SEEN /hpf (ref 0–2)
Renal Epithel, UA: NONE SEEN /hpf
WBC, UA: NONE SEEN /hpf (ref 0–5)

## 2020-07-05 LAB — URINALYSIS, ROUTINE W REFLEX MICROSCOPIC
Bilirubin, UA: NEGATIVE
Glucose, UA: NEGATIVE
Leukocytes,UA: NEGATIVE
Nitrite, UA: NEGATIVE
RBC, UA: NEGATIVE
Specific Gravity, UA: 1.025 (ref 1.005–1.030)
Urobilinogen, Ur: 0.2 mg/dL (ref 0.2–1.0)
pH, UA: 6 (ref 5.0–7.5)

## 2020-07-05 NOTE — Progress Notes (Signed)
Subjective:  1. Cancer of prostate ([place])   2. Elevated PSA   3. Nodular prostate with lower urinary tract symptoms   4. Nocturia     07/05/20: Jacob Rios returns today in f/u for his history of prostate cancer.  His PSA has fallen to 3.0 from 13.3 prior to treatment.   His IPSS is 10 with nocturia x 4.   His UA is ok today.    03/29/20: Jacob Rios returns today in f/u from his recent seed implant on 03/09/20.  He is voiding well with an IPSS of 8. He has nocturia x 4.  He has some hesitancy.  His UA has >30 RBC's.   He has felt a lump about an inch up in the rectum that is probably the [place].  He has mild dysuria.     11/22/19: Jacob Rios returns today following a prostate biopsy for his history of low risk prostate cancer on surveillance.   The biopsy demonstrated 3 positive cores with Gleason 6 in the right and left apical lateral cores and Gleason 7(3+4) in the right apical medial core.   The prostate volume was 15ml.    His most recent PSA was 13.3 and has been rising.   He had a UTI and went to the ER in late July 2020 and was given antibiotics but had Mx species on the culture.    He has not had any further UTI's.  He passed a stone last year.  His IPSS is10.  His UA today is clear.   He had an MRI fusion biopsy in 3/19 for surveillance of his low risk prostate cancer. He had a single core of 20% of gleason 6 disease in the left lateral apex. he had atypia in one core. His PSA was 8.5 in 9/19 and 9.0 in 1/19. He has no associated signs or symptoms.   He was found to have a T2a Nx Mx Gleason 6 prostate cancer with 5% in one left apical and one right apical core in 07/26/13. A repeat biopsy in 8/16 had a single core of Gleason 6 in the left mid lateral gland with 10% involvement. His PSA prior to this visit is up to 8.4 from 6.6 in 5/18. It had been stable for the last 2 years. His Prostate volume was 31ml initally and 75ml on the repeat. He has a history of a 37mm right base prostate nodule and BOO. He  has no associated signs or symptoms.  IPSS     Row Name 07/05/20 0900         International Prostate Symptom Score   How often have you had the sensation of not emptying your bladder? Less than 1 in 5     How often have you had to urinate less than every two hours? Less than 1 in 5 times     How often have you found you stopped and started again several times when you urinated? Less than half the time     How often have you found it difficult to postpone urination? Less than 1 in 5 times     How often have you had a weak urinary stream? Less than 1 in 5 times     How often have you had to strain to start urination? Not at All     How many times did you typically get up at night to urinate? 4 Times     Total IPSS Score 10  Quality of Life due to urinary symptoms     If you were to spend the rest of your life with your urinary condition just the way it is now how would you feel about that? Pleased              ROS:  ROS:  A complete review of systems was performed.  All systems are negative except for pertinent findings as noted.   ROS  Allergies  Allergen Reactions   Morphine Shortness Of Breath and Swelling    Outpatient Encounter Medications as of 07/05/2020  Medication Sig   aspirin 81 MG tablet Take 81 mg by mouth daily.   atorvastatin (LIPITOR) 40 MG tablet Take 1 tablet by mouth once daily (Patient taking differently: Take 40 mg by mouth at bedtime.)   Cholecalciferol (D3 ADULT PO) Take 1 tablet by mouth daily.   CRANBERRY PO Take by mouth daily.   glipiZIDE (GLUCOTROL XL) 10 MG 24 hr tablet Take 1 tablet by mouth once daily   glucose blood (ONETOUCH VERIO) test strip Test 1X per day and as needed  Dx 250.02   Lancets (ONETOUCH ULTRASOFT) lancets Patient test 1X per day and prn  Dx 250.02   lisinopril (ZESTRIL) 2.5 MG tablet Take 1 tablet (2.5 mg total) by mouth daily. (Patient taking differently: Take 2.5 mg by mouth daily.)   metFORMIN (GLUCOPHAGE) 1000  MG tablet Take 1 tablet (1,000 mg total) by mouth 2 (two) times daily with a meal. (Patient taking differently: Take 1,000 mg by mouth 2 (two) times daily with a meal.)   metoprolol succinate (TOPROL-XL) 50 MG 24 hr tablet Take 1 tablet (50 mg total) by mouth daily. Take with or immediately following a meal.   omeprazole (PRILOSEC) 40 MG capsule Take 1 capsule (40 mg total) by mouth daily. (Patient taking differently: Take 40 mg by mouth daily.)   Phenyleph-Doxylamine-DM-APAP (ALKA-SELTZER PLS ALLER[facility] PO) Take 1 tablet by mouth once. As needed   vitamin C (ASCORBIC ACID) 500 MG tablet Take 500 mg by mouth daily.   [DISCONTINUED] HYDROcodone-acetaminophen (NORCO/VICODIN) 5-325 MG tablet Take 1 tablet by mouth every 6 (six) hours as needed for moderate pain. (Patient not taking: Reported on 07/05/2020)   No facility-administered encounter medications on file as of 07/05/2020.    Past Medical History:  Diagnosis Date   CKD (chronic kidney disease), stage III ([place])    followed by pcp   Coronary artery disease cardiologist--- dr Angelena Form   08/ 1999  s/p  cath w/ PTCA and stenting to RCA;   05/ 2000 inferior wall MI , 06-06-1998 s/p cabg x3;   Last heart cath 2005,  2 patent grafts (diagnol and RCA) and occluded LIMA--LAD graft with normal LAD nonobstructive disease;  last nuclear study 11/ 2014 no evidence ishcemia, ef 40%   Full dentures    Hiatal hernia    History of acute inferior wall MI 05/1998   s/p  cabg   Hypertension    Mixed hyperlipidemia    Nocturia more than twice per night    PAD (peripheral artery disease) ([facility])    left common iliac artery stenosis per aorta ultrasound 03/ 2021 in epic and cardiology note   Prostate cancer [facility]) urologist--- dr Jeffie Pollock   first dx 07/ 2015 in active survillance until bx 11-11-2019,  Stage T2a, Gleason 3+4, PSA 13.3   S/P CABG x 3 06/06/1998   LIMA--LAD, SVG to Diagonal, RIMA to RCA   S/P primary angioplasty with  coronary stent 08/1997   stent  to RCA   Type 2 diabetes mellitus ([place])    followed by pcp  (03-06-2020 checks blood sugar dialy in am,  fasting sugar-- 110-130)    Past Surgical History:  Procedure Laterality Date   APPENDECTOMY  child   CARDIAC CATHETERIZATION  09/01/2000  @[facility]    patent RCA and Diagonal grafts, atretic LIMA, moderate nonobstructive proxLAD, normal lvsf   CARDIAC CATHETERIZATION  04-21-2003  @[facility]    LIMA--LAD graft occluded, native LAD with nonobstructive disease,  patent diagonal/ rca grafts   CATARACT EXTRACTION W/ INTRAOCULAR LENS IMPLANT Right 12/2018   CORONARY ANGIOPLASTY WITH STENT PLACEMENT  08/1997  @[facility]    ptca w/ stenting to rca   CORONARY ARTERY BYPASS GRAFT  06-06-1998  @[facility]     LIMA -- LAD, SVG -- Diagonal,  RIMA to RCA   CYSTOSCOPY N/A 03/09/2020   Procedure: CYSTOSCOPY FLEXIBLE;  Surgeon: [name], MD;  Location: [facility];  Service: Urology;  Laterality: N/A;  NO SEEDS FOUND IN BLADDER   INGUINAL HERNIA REPAIR Right 1974   PROSTATE BIOPSY  06/2017   x 4-5    RADIOACTIVE SEED IMPLANT N/A 03/09/2020   Procedure: RADIOACTIVE SEED IMPLANT/BRACHYTHERAPY IMPLANT;  Surgeon: [name], MD;  Location: [facility];  Service: Urology;  Laterality: N/A;   59  SEEDS IMPLANTED   SPACE OAR INSTILLATION N/A 03/09/2020   Procedure: SPACE OAR INSTILLATION;  Surgeon: [name], MD;  Location: [facility];  Service: Urology;  Laterality: N/A;   VENTRAL HERNIA REPAIR  09/ 2000 and recurrent repair 06/ 2001    Social History   Socioeconomic History   Marital status: Married    Spouse name: Vaughan Basta    Number of children: 3   Years of education: Not on file   Highest education level: Not on file  Occupational History   Occupation: part-time trucker    Comment: retired   Tobacco Use   Smoking status: Former    Packs/day: 4.00    Years: 45.00    Pack years: 180.00    Types: Cigarettes    Quit date: 01/13/1997    Years since quitting: 23.4   Smokeless  tobacco: Never   Tobacco comments:    reports he began smoking at age 52  Vaping Use   Vaping Use: Never used  Substance and Sexual Activity   Alcohol use: No   Drug use: Never   Sexual activity: Not on file  Other Topics Concern   Not on file  Social History Narrative   Lives home with wife   Social Determinants of Health   Financial Resource Strain: Low Risk    Difficulty of Paying Living Expenses: Not very hard  Food Insecurity: No Food Insecurity   Worried About Charity fundraiser in the Last Year: Never true   [place] in the Last Year: Never true  Transportation Needs: No Transportation Needs   Lack of Transportation (Medical): No   Lack of Transportation (Non-Medical): No  Physical Activity: Sufficiently Active   Days of Exercise per Week: 7 days   Minutes of Exercise per Session: 30 min  Stress: No Stress Concern Present   Feeling of Stress : Only a little  Social Connections: Engineer, building services of Communication with Friends and Family: More than three times a week   Frequency of Social Gatherings with Friends and Family: More than three times a week   Attends Religious Services:  More than 4 times per year   Active Member of Clubs or Organizations: Yes   Attends Music therapist: More than 4 times per year   Marital Status: Married  Human resources officer Violence: Not At Risk   Fear of Current or Ex-Partner: No   Emotionally Abused: No   Physically Abused: No   Sexually Abused: No    Family History  Problem Relation Age of Onset   Lung cancer Mother    Dementia Father    Hyperlipidemia Father    Congestive Heart Failure Father    Post-traumatic stress disorder Son    Alcohol abuse Maternal Grandfather    Cancer Maternal Grandfather 90       stomach cancer    Heart attack Neg Hx    Stroke Neg Hx    Breast cancer Neg Hx    Colon cancer Neg Hx    Prostate cancer Neg Hx    Pancreatic cancer Neg Hx         Objective: Vitals:   07/05/20 0900  BP: 139/86  Pulse: 84  Temp: 97.9 F (36.6 C)     Physical Exam  Lab Results:  Results for orders placed or performed in visit on 07/05/20 (from the past 24 hour(s))  Urinalysis, Routine w reflex microscopic     Status: Abnormal   Collection Time: 07/05/20  9:54 AM  Result Value Ref Range   Specific Gravity, UA 1.025 1.005 - 1.030   pH, UA 6.0 5.0 - 7.5   Color, UA Yellow Yellow   Appearance Ur Clear Clear   Leukocytes,UA Negative Negative   Protein,UA 1+ (A) Negative/Trace   Glucose, UA Negative Negative   Ketones, UA Trace (A) Negative   RBC, UA Negative Negative   Bilirubin, UA Negative Negative   Urobilinogen, Ur 0.2 0.2 - 1.0 mg/dL   Nitrite, UA Negative Negative   Microscopic Examination See below:    Narrative   Performed at:  [address] [place] [address], [place], [place]  [id number] Lab Director: Mina Marble MT, Phone:  [phone]  Microscopic Examination     Status: None   Collection Time: 07/05/20  9:54 AM   Urine  Result Value Ref Range   WBC, UA None seen 0 - 5 /hpf   RBC None seen 0 - 2 /hpf   Epithelial Cells (non renal) None seen 0 - 10 /hpf   Renal Epithel, UA None seen None seen /hpf   Mucus, UA Present Not Estab.   Bacteria, UA Few None seen/Few   Narrative   Performed at:  [facility] [address] [place], [place]  [id number] Lab Director: [place], Phone:  [phone]     BMET No results for input(s): NA, K, CL, CO2, GLUCOSE, BUN, CREATININE, CALCIUM in the last 72 hours. PSA PSA  Date Value Ref Range Status  03/30/2019 10.4 (H) < OR = 4.0 ng/mL Final    Comment:    The total PSA value from this assay system is  standardized against the WHO standard. The test  result will be approximately 20% lower when compared  to the equimolar-standardized total PSA (Beckman  Coulter). Comparison of serial PSA results should be  interpreted with this fact in  mind. . This test was performed using the Siemens  chemiluminescent method. Values obtained from  different assay methods cannot be used interchangeably. PSA levels, regardless of value, should not be interpreted as absolute evidence of the presence or  absence of disease.   11/18/2013 5.0 (H) 0.0 - 4.0 ng/mL Final    Comment:    Roche ECLIA methodology. According to the American Urological Association, Serum PSA should decrease and remain at undetectable levels after radical prostatectomy. The AUA defines biochemical recurrence as an initial PSA value 0.2 ng/mL or greater followed by a subsequent confirmatory PSA value 0.2 ng/mL or greater. Values obtained with different assay methods or kits cannot be used interchangeably. Results cannot be interpreted as absolute evidence of the presence or absence of malignant disease.   05/20/2013 5.3 (H) 0.0 - 4.0 ng/mL Final    Comment:    Roche ECLIA methodology. According to the American Urological Association, Serum PSA should decrease and remain at undetectable levels after radical prostatectomy. The AUA defines biochemical recurrence as an initial PSA value 0.2 ng/mL or greater followed by a subsequent confirmatory PSA value 0.2 ng/mL or greater. Values obtained with different assay methods or kits cannot be used interchangeably. Results cannot be interpreted as absolute evidence of the presence or absence of malignant disease.   No results found for: TESTOSTERONE  Recent Results (from the past 2160 hour(s))  PSA     Status: None   Collection Time: 06/28/20  8:45 AM  Result Value Ref Range   Prostate Specific Ag, Serum 3.0 0.0 - 4.0 ng/mL    Comment: Roche ECLIA methodology. According to the American Urological Association, Serum PSA should decrease and remain at undetectable levels after radical prostatectomy. The AUA defines biochemical recurrence as an initial PSA value 0.2 ng/mL or greater followed by a subsequent  confirmatory PSA value 0.2 ng/mL or greater. Values obtained with different assay methods or kits cannot be used interchangeably. Results cannot be interpreted as absolute evidence of the presence or absence of malignant disease.   Urinalysis, Routine w reflex microscopic     Status: Abnormal   Collection Time: 07/05/20  9:54 AM  Result Value Ref Range   Specific Gravity, UA 1.025 1.005 - 1.030   pH, UA 6.0 5.0 - 7.5   Color, UA Yellow Yellow   Appearance Ur Clear Clear   Leukocytes,UA Negative Negative   Protein,UA 1+ (A) Negative/Trace   Glucose, UA Negative Negative   Ketones, UA Trace (A) Negative   RBC, UA Negative Negative   Bilirubin, UA Negative Negative   Urobilinogen, Ur 0.2 0.2 - 1.0 mg/dL   Nitrite, UA Negative Negative   Microscopic Examination See below:   Microscopic Examination     Status: None   Collection Time: 07/05/20  9:54 AM   Urine  Result Value Ref Range   WBC, UA None seen 0 - 5 /hpf   RBC None seen 0 - 2 /hpf   Epithelial Cells (non renal) None seen 0 - 10 /hpf   Renal Epithel, UA None seen None seen /hpf   Mucus, UA Present Not Estab.   Bacteria, UA Few None seen/Few     Studies/Results: Path report reviewed.     Assessment & Plan: T2a Nx Mx Gleason 7(3+4) disease s/p seed implant.   He is doing well post op with a nice decline in the PSA.  He with return in 6 months with a PSA.   Microhematuria.  Resolved.  Nodular prostate with LUTS.   He has stable nocturia.   No orders of the defined types were placed in this encounter.    Orders Placed This Encounter  Procedures   Microscopic Examination   Urinalysis, Routine w reflex microscopic  PSA    Standing Status:   Future    Standing Expiration Date:   07/05/2021      Return in about 6 months (around 01/04/2021) for with a PSA. .   CC: [name], FNP      [name] 07/05/2020

## 2020-07-05 NOTE — Progress Notes (Signed)

## 2020-07-15 ENCOUNTER — Other Ambulatory Visit: Payer: Self-pay | Admitting: Nurse Practitioner

## 2020-07-15 DIAGNOSIS — E119 Type 2 diabetes mellitus without complications: Secondary | ICD-10-CM

## 2020-07-15 DIAGNOSIS — I10 Essential (primary) hypertension: Secondary | ICD-10-CM

## 2020-07-20 ENCOUNTER — Other Ambulatory Visit: Payer: Self-pay

## 2020-07-20 ENCOUNTER — Ambulatory Visit: Payer: Medicare PPO | Admitting: Nurse Practitioner

## 2020-07-20 ENCOUNTER — Encounter: Payer: Self-pay | Admitting: Nurse Practitioner

## 2020-07-20 VITALS — BP 117/68 | HR 63 | Temp 98.0°F | Resp 20 | Ht 72.0 in | Wt 230.0 lb

## 2020-07-20 DIAGNOSIS — E1122 Type 2 diabetes mellitus with diabetic chronic kidney disease: Secondary | ICD-10-CM

## 2020-07-20 DIAGNOSIS — K219 Gastro-esophageal reflux disease without esophagitis: Secondary | ICD-10-CM | POA: Diagnosis not present

## 2020-07-20 DIAGNOSIS — C61 Malignant neoplasm of prostate: Secondary | ICD-10-CM | POA: Diagnosis not present

## 2020-07-20 DIAGNOSIS — I25810 Atherosclerosis of coronary artery bypass graft(s) without angina pectoris: Secondary | ICD-10-CM | POA: Diagnosis not present

## 2020-07-20 DIAGNOSIS — I10 Essential (primary) hypertension: Secondary | ICD-10-CM

## 2020-07-20 DIAGNOSIS — Z6831 Body mass index (BMI) 31.0-31.9, adult: Secondary | ICD-10-CM

## 2020-07-20 DIAGNOSIS — N183 Chronic kidney disease, stage 3 unspecified: Secondary | ICD-10-CM | POA: Diagnosis not present

## 2020-07-20 DIAGNOSIS — E119 Type 2 diabetes mellitus without complications: Secondary | ICD-10-CM | POA: Diagnosis not present

## 2020-07-20 DIAGNOSIS — E782 Mixed hyperlipidemia: Secondary | ICD-10-CM | POA: Diagnosis not present

## 2020-07-20 LAB — CBC WITH DIFFERENTIAL/PLATELET
Basophils Absolute: 0.1 10*3/uL (ref 0.0–0.2)
Basos: 1 %
EOS (ABSOLUTE): 0.4 10*3/uL (ref 0.0–0.4)
Eos: 5 %
Hematocrit: 39.1 % (ref 37.5–51.0)
Hemoglobin: 13.2 g/dL (ref 13.0–17.7)
Immature Grans (Abs): 0 10*3/uL (ref 0.0–0.1)
Immature Granulocytes: 0 %
Lymphocytes Absolute: 1.1 10*3/uL (ref 0.7–3.1)
Lymphs: 16 %
MCH: 28.4 pg (ref 26.6–33.0)
MCHC: 33.8 g/dL (ref 31.5–35.7)
MCV: 84 fL (ref 79–97)
Monocytes Absolute: 0.5 10*3/uL (ref 0.1–0.9)
Monocytes: 8 %
Neutrophils Absolute: 5 10*3/uL (ref 1.4–7.0)
Neutrophils: 70 %
Platelets: 196 10*3/uL (ref 150–450)
RBC: 4.64 x10E6/uL (ref 4.14–5.80)
RDW: 13.7 % (ref 11.6–15.4)
WBC: 7.1 10*3/uL (ref 3.4–10.8)

## 2020-07-20 LAB — CMP14+EGFR
ALT: 16 IU/L (ref 0–44)
AST: 15 IU/L (ref 0–40)
Albumin/Globulin Ratio: 1.6 (ref 1.2–2.2)
Albumin: 4.2 g/dL (ref 3.7–4.7)
Alkaline Phosphatase: 111 IU/L (ref 44–121)
BUN/Creatinine Ratio: 11 (ref 10–24)
BUN: 17 mg/dL (ref 8–27)
Bilirubin Total: 0.3 mg/dL (ref 0.0–1.2)
CO2: 24 mmol/L (ref 20–29)
Calcium: 9.7 mg/dL (ref 8.6–10.2)
Chloride: 104 mmol/L (ref 96–106)
Creatinine, Ser: 1.49 mg/dL — ABNORMAL HIGH (ref 0.76–1.27)
Globulin, Total: 2.6 g/dL (ref 1.5–4.5)
Glucose: 98 mg/dL (ref 65–99)
Potassium: 5.3 mmol/L — ABNORMAL HIGH (ref 3.5–5.2)
Sodium: 140 mmol/L (ref 134–144)
Total Protein: 6.8 g/dL (ref 6.0–8.5)
eGFR: 49 mL/min/{1.73_m2} — ABNORMAL LOW (ref >59)

## 2020-07-20 LAB — LIPID PANEL
Chol/HDL Ratio: 3.5 ratio (ref 0.0–5.0)
Cholesterol, Total: 101 mg/dL (ref 100–199)
HDL: 29 mg/dL — ABNORMAL LOW (ref >39)
LDL Chol Calc (NIH): 55 mg/dL (ref 0–99)
Triglycerides: 87 mg/dL (ref 0–149)
VLDL Cholesterol Cal: 17 mg/dL (ref 5–40)

## 2020-07-20 LAB — BAYER DCA HB A1C WAIVED: HB A1C (BAYER DCA - WAIVED): 6.1 % (ref <7.0)

## 2020-07-20 MED ORDER — GLIPIZIDE ER 10 MG PO TB24: 10.0000 mg | ORAL_TABLET | Freq: Every day | ORAL | 1 refills | Status: DC

## 2020-07-20 MED ORDER — METFORMIN HCL 1000 MG PO TABS: 1000.0000 mg | ORAL_TABLET | Freq: Two times a day (BID) | ORAL | 1 refills | Status: DC

## 2020-07-20 MED ORDER — METOPROLOL SUCCINATE ER 50 MG PO TB24: 50.0000 mg | ORAL_TABLET | Freq: Every day | ORAL | 1 refills | Status: DC

## 2020-07-20 MED ORDER — ATORVASTATIN CALCIUM 40 MG PO TABS
40.0000 mg | ORAL_TABLET | Freq: Every day | ORAL | 1 refills | Status: DC
Start: 2020-07-20 — End: 2021-02-01

## 2020-07-20 MED ORDER — OMEPRAZOLE 40 MG PO CPDR: 40.0000 mg | DELAYED_RELEASE_CAPSULE | Freq: Every day | ORAL | 1 refills | Status: DC

## 2020-07-20 MED ORDER — LISINOPRIL 2.5 MG PO TABS: 2.5000 mg | ORAL_TABLET | Freq: Every day | ORAL | 1 refills | Status: DC

## 2020-07-20 NOTE — Patient Instructions (Signed)
https://www.nhlbi.nih.gov/files/docs/public/heart/dash_brief.pdf">  DASH Eating Plan DASH stands for Dietary Approaches to Stop Hypertension. The DASH eating plan is a healthy eating plan that has been shown to: Reduce high blood pressure (hypertension). Reduce your risk for type 2 diabetes, heart disease, and stroke. Help with weight loss. What are tips for following this plan? Reading food labels Check food labels for the amount of salt (sodium) per serving. Choose foods with less than 5 percent of the Daily Value of sodium. Generally, foods with less than 300 milligrams (mg) of sodium per serving fit into this eating plan. To find whole grains, look for the word "whole" as the first word in the ingredient list. Shopping Buy products labeled as "low-sodium" or "no salt added." Buy fresh foods. Avoid canned foods and pre-made or frozen meals. Cooking Avoid adding salt when cooking. Use salt-free seasonings or herbs instead of table salt or sea salt. Check with your health care provider or pharmacist before using salt substitutes. Do not fry foods. Cook foods using healthy methods such as baking, boiling, grilling, roasting, and broiling instead. Cook with heart-healthy oils, such as olive, canola, avocado, soybean, or sunflower oil. Meal planning  Eat a balanced diet that includes: 4 or more servings of fruits and 4 or more servings of vegetables each day. Try to fill one-half of your plate with fruits and vegetables. 6-8 servings of whole grains each day. Less than 6 oz (170 g) of lean meat, poultry, or fish each day. A 3-oz (85-g) serving of meat is about the same size as a deck of cards. One egg equals 1 oz (28 g). 2-3 servings of low-fat dairy each day. One serving is 1 cup (237 mL). 1 serving of nuts, seeds, or beans 5 times each week. 2-3 servings of heart-healthy fats. Healthy fats called omega-3 fatty acids are found in foods such as walnuts, flaxseeds, fortified milks, and eggs.  These fats are also found in cold-water fish, such as sardines, salmon, and mackerel. Limit how much you eat of: Canned or prepackaged foods. Food that is high in trans fat, such as some fried foods. Food that is high in saturated fat, such as fatty meat. Desserts and other sweets, sugary drinks, and other foods with added sugar. Full-fat dairy products. Do not salt foods before eating. Do not eat more than 4 egg yolks a week. Try to eat at least 2 vegetarian meals a week. Eat more home-cooked food and less restaurant, buffet, and fast food.  Lifestyle When eating at a restaurant, ask that your food be prepared with less salt or no salt, if possible. If you drink alcohol: Limit how much you use to: 0-1 drink a day for women who are not pregnant. 0-2 drinks a day for men. Be aware of how much alcohol is in your drink. In the [place] one drink equals one 12 oz bottle of beer (355 mL), one 5 oz glass of wine (148 mL), or one 1 oz glass of hard liquor (44 mL). General information Avoid eating more than 2,300 mg of salt a day. If you have hypertension, you may need to reduce your sodium intake to 1,500 mg a day. Work with your health care provider to maintain a healthy body weight or to lose weight. Ask what an ideal weight is for you. Get at least 30 minutes of exercise that causes your heart to beat faster (aerobic exercise) most days of the week. Activities may include walking, swimming, or biking. Work with your health care provider   or dietitian to adjust your eating plan to your individual calorie needs. What foods should I eat? Fruits All fresh, dried, or frozen fruit. Canned fruit in natural juice (without addedsugar). Vegetables Fresh or frozen vegetables (raw, steamed, roasted, or grilled). Low-sodium or reduced-sodium tomato and vegetable juice. Low-sodium or reduced-sodium tomatosauce and tomato paste. Low-sodium or reduced-sodium canned vegetables. Grains Whole-grain or  whole-wheat bread. Whole-grain or whole-wheat pasta. Brown rice. Oatmeal. Quinoa. Bulgur. Whole-grain and low-sodium cereals. Pita bread.Low-fat, low-sodium crackers. Whole-wheat flour tortillas. Meats and other proteins Skinless chicken or turkey. Ground chicken or turkey. Pork with fat trimmed off. Fish and seafood. Egg whites. Dried beans, peas, or lentils. Unsalted nuts, nut butters, and seeds. Unsalted canned beans. Lean cuts of beef with fat trimmed off. Low-sodium, lean precooked or cured meat, such as sausages or meatloaves. Dairy Low-fat (1%) or fat-free (skim) milk. Reduced-fat, low-fat, or fat-free cheeses. Nonfat, low-sodium ricotta or cottage cheese. Low-fat or nonfatyogurt. Low-fat, low-sodium cheese. Fats and oils Soft margarine without trans fats. Vegetable oil. Reduced-fat, low-fat, or light mayonnaise and salad dressings (reduced-sodium). Canola, safflower, olive, avocado, soybean, andsunflower oils. Avocado. Seasonings and condiments Herbs. Spices. Seasoning mixes without salt. Other foods Unsalted popcorn and pretzels. Fat-free sweets. The items listed above may not be a complete list of foods and beverages you can eat. Contact a dietitian for more information. What foods should I avoid? Fruits Canned fruit in a light or heavy syrup. Fried fruit. Fruit in cream or buttersauce. Vegetables Creamed or fried vegetables. Vegetables in a cheese sauce. Regular canned vegetables (not low-sodium or reduced-sodium). Regular canned tomato sauce and paste (not low-sodium or reduced-sodium). Regular tomato and vegetable juice(not low-sodium or reduced-sodium). Pickles. Olives. Grains Baked goods made with fat, such as croissants, muffins, or some breads. Drypasta or rice meal packs. Meats and other proteins Fatty cuts of meat. Ribs. Fried meat. Bacon. Bologna, salami, and other precooked or cured meats, such as sausages or meat loaves. Fat from the back of a pig (fatback). Bratwurst.  Salted nuts and seeds. Canned beans with added salt. Canned orsmoked fish. Whole eggs or egg yolks. Chicken or turkey with skin. Dairy Whole or 2% milk, cream, and half-and-half. Whole or full-fat cream cheese. Whole-fat or sweetened yogurt. Full-fat cheese. Nondairy creamers. Whippedtoppings. Processed cheese and cheese spreads. Fats and oils Butter. Stick margarine. Lard. Shortening. Ghee. Bacon fat. Tropical oils, suchas coconut, palm kernel, or palm oil. Seasonings and condiments Onion salt, garlic salt, seasoned salt, table salt, and sea salt. Worcestershire sauce. Tartar sauce. Barbecue sauce. Teriyaki sauce. Soy sauce, including reduced-sodium. Steak sauce. Canned and packaged gravies. Fish sauce. Oyster sauce. Cocktail sauce. Store-bought horseradish. Ketchup. Mustard. Meat flavorings and tenderizers. Bouillon cubes. Hot sauces. Pre-made or packaged marinades. Pre-made or packaged taco seasonings. Relishes. Regular saladdressings. Other foods Salted popcorn and pretzels. The items listed above may not be a complete list of foods and beverages you should avoid. Contact a dietitian for more information. Where to find more information National Heart, Lung, and Blood Institute: www.nhlbi.nih.gov American Heart Association: www.heart.org Academy of Nutrition and Dietetics: www.eatright.org National Kidney Foundation: www.kidney.org Summary The DASH eating plan is a healthy eating plan that has been shown to reduce high blood pressure (hypertension). It may also reduce your risk for type 2 diabetes, heart disease, and stroke. When on the DASH eating plan, aim to eat more fresh fruits and vegetables, whole grains, lean proteins, low-fat dairy, and heart-healthy fats. With the DASH eating plan, you should limit salt (sodium) intake to 2,300   mg a day. If you have hypertension, you may need to reduce your sodium intake to 1,500 mg a day. Work with your health care provider or dietitian to adjust  your eating plan to your individual calorie needs. This information is not intended to replace advice given to you by your health care provider. Make sure you discuss any questions you have with your healthcare provider. Document Revised: 12/03/2018 Document Reviewed: 12/03/2018 Elsevier Patient Education  2022 [employer]vier Inc.  

## 2020-07-20 NOTE — Progress Notes (Signed)
Subjective:    Patient ID: Jacob Rios, male    DOB: [birth year], 75 y.o.   MRN: [id number]   Chief Complaint: Medical Management of Chronic Issues    HPI:  1. Essential hypertension No c/o chest pain, sob or headache. Does not check blood pressure at home. BP Readings from Last 3 Encounters:  07/05/20 139/86  04/11/20 (!) 160/81  03/29/20 132/73     2. Mixed hyperlipidemia Does not watch diet and does no dedicated exercise Lab Results  Component Value Date   CHOL 119 01/20/2020   HDL 30 (L) 01/20/2020   LDLCALC 68 01/20/2020   TRIG 116 01/20/2020   CHOLHDL 4.0 01/20/2020     3. Type 2 diabetes mellitus without complication, without long-term current use of insulin (HCC) Fasting blood sugars are running around 115-125. He denies any low blood sugars. Lab Results  Component Value Date   HGBA1C 6.3 01/20/2020     4. Coronary atherosclerosis of autologous vein bypass graft without angina Last saw cardiology on 02/08/20. No change was made to plan of care. A AAA duplex study was done which was unchanged from previous study in 2021. AAA measured 2.8 cm.  5. Gastroesophageal reflux disease without esophagitis Is on omprazole daily and is working well to keep symptoms under control.  6. CKD stage 3 due to type 2 diabetes mellitus (HCC) No problems voiding Lab Results  Component Value Date   CREATININE 1.53 (H) 03/06/2020     7. Malignant neoplasm of prostate ([place]) Has radioactive seeds implanted. He last saw DR. Wrenn on 07/05/20. He was doing well and has follow up in December. Lab Results  Component Value Date   PSA1 3.0 06/28/2020   PSA1 13.3 (H) 09/23/2019   PSA1 8.4 (H) 12/18/2016   PSA 10.4 (H) 03/30/2019   PSA 5.0 (H) 11/18/2013   PSA 5.3 (H) 05/20/2013      8. BMI 31.0-31.9,adult Weight is down 5lbs Wt Readings from Last 3 Encounters:  07/20/20 230 lb (104.3 kg)  03/29/20 235 lb (106.6 kg)  03/09/20 234 lb 14.4 oz (106.5 kg)   BMI Readings  from Last 3 Encounters:  07/20/20 31.19 kg/m  03/29/20 31.87 kg/m  03/09/20 32.76 kg/m        Outpatient Encounter Medications as of 07/20/2020  Medication Sig  . aspirin 81 MG tablet Take 81 mg by mouth daily.  Marland Kitchen atorvastatin (LIPITOR) 40 MG tablet Take 1 tablet by mouth once daily (Patient taking differently: Take 40 mg by mouth at bedtime.)  . Cholecalciferol (D3 ADULT PO) Take 1 tablet by mouth daily.  Marland Kitchen CRANBERRY PO Take by mouth daily.  Marland Kitchen glipiZIDE (GLUCOTROL XL) 10 MG 24 hr tablet Take 1 tablet by mouth once daily  . glucose blood (ONETOUCH VERIO) test strip Test 1X per day and as needed  Dx 250.02  . Lancets (ONETOUCH ULTRASOFT) lancets Patient test 1X per day and prn  Dx 250.02  . lisinopril (ZESTRIL) 2.5 MG tablet Take 1 tablet by mouth once daily  . metFORMIN (GLUCOPHAGE) 1000 MG tablet Take 1 tablet (1,000 mg total) by mouth 2 (two) times daily with a meal. (Patient taking differently: Take 1,000 mg by mouth 2 (two) times daily with a meal.)  . metoprolol succinate (TOPROL-XL) 50 MG 24 hr tablet Take 1 tablet (50 mg total) by mouth daily. Take with or immediately following a meal.  . omeprazole (PRILOSEC) 40 MG capsule Take 1 capsule (40 mg total) by mouth daily. (Patient taking  differently: Take 40 mg by mouth daily.)  . Phenyleph-Doxylamine-DM-APAP (ALKA-SELTZER PLS ALLERGY & CGH PO) Take 1 tablet by mouth once. As needed  . vitamin C (ASCORBIC ACID) 500 MG tablet Take 500 mg by mouth daily.   No facility-administered encounter medications on file as of 07/20/2020.    Past Surgical History:  Procedure Laterality Date  . APPENDECTOMY  child  . CARDIAC CATHETERIZATION  09/01/2000  '@[facility]'    patent RCA and Diagonal grafts, atretic LIMA, moderate nonobstructive proxLAD, normal lvsf  . CARDIAC CATHETERIZATION  04-21-2003  '@[facility]'    LIMA--LAD graft occluded, native LAD with nonobstructive disease,  patent diagonal/ rca grafts  . CATARACT EXTRACTION W/ INTRAOCULAR LENS IMPLANT Right  12/2018  . CORONARY ANGIOPLASTY WITH STENT PLACEMENT  08/1997  '@[facility]'    ptca w/ stenting to rca  . CORONARY ARTERY BYPASS GRAFT  06-06-1998  '@[facility]'     LIMA -- LAD, SVG -- Diagonal,  RIMA to RCA  . CYSTOSCOPY N/A 03/09/2020   Procedure: CYSTOSCOPY FLEXIBLE;  Surgeon: [name], MD;  Location: [facility];  Service: Urology;  Laterality: N/A;  NO SEEDS FOUND IN BLADDER  . INGUINAL HERNIA REPAIR Right 1974  . PROSTATE BIOPSY  06/2017   x 4-5   . RADIOACTIVE SEED IMPLANT N/A 03/09/2020   Procedure: RADIOACTIVE SEED IMPLANT/BRACHYTHERAPY IMPLANT;  Surgeon: [name], MD;  Location: [facility];  Service: Urology;  Laterality: N/A;   70  SEEDS IMPLANTED  . SPACE OAR INSTILLATION N/A 03/09/2020   Procedure: SPACE OAR INSTILLATION;  Surgeon: [name], MD;  Location: [facility];  Service: Urology;  Laterality: N/A;  . VENTRAL HERNIA REPAIR  09/ 2000 and recurrent repair 06/ 2001    Family History  Problem Relation Age of Onset  . Lung cancer Mother   . Dementia Father   . Hyperlipidemia Father   . Congestive Heart Failure Father   . Post-traumatic stress disorder Son   . Alcohol abuse Maternal Grandfather   . Cancer Maternal Grandfather 90       stomach cancer   . Heart attack Neg Hx   . Stroke Neg Hx   . Breast cancer Neg Hx   . Colon cancer Neg Hx   . Prostate cancer Neg Hx   . Pancreatic cancer Neg Hx     New complaints: None today  Social history: Lives with wife  Controlled substance contract: n/a     Review of Systems  Constitutional:  Negative for diaphoresis.  Eyes:  Negative for pain.  Respiratory:  Negative for shortness of breath.   Cardiovascular:  Negative for chest pain, palpitations and leg swelling.  Gastrointestinal:  Negative for abdominal pain.  Endocrine: Negative for polydipsia.  Skin:  Negative for rash.  Neurological:  Negative for dizziness, weakness and headaches.  Hematological:  Does not bruise/bleed  easily.  All other systems reviewed and are negative.     Objective:   Physical Exam Vitals and nursing note reviewed.  Constitutional:      Appearance: Normal appearance. He is well-developed.  HENT:     Head: Normocephalic.     Nose: Nose normal.  Eyes:     Pupils: Pupils are equal, round, and reactive to light.  Neck:     Thyroid: No thyroid mass or thyromegaly.     Vascular: No carotid bruit or JVD.     Trachea: Phonation normal.  Cardiovascular:     Rate and Rhythm: Normal rate and regular rhythm.  Pulmonary:  Effort: Pulmonary effort is normal. No respiratory distress.     Breath sounds: Normal breath sounds.  Abdominal:     General: Bowel sounds are normal.     Palpations: Abdomen is soft.     Tenderness: There is no abdominal tenderness.  Musculoskeletal:        General: Normal range of motion.     Cervical back: Normal range of motion and neck supple.  Lymphadenopathy:     Cervical: No cervical adenopathy.  Skin:    General: Skin is warm and dry.  Neurological:     Mental Status: He is alert and oriented to person, place, and time.  Psychiatric:        Behavior: Behavior normal.        Thought Content: Thought content normal.        Judgment: Judgment normal.    BP 117/68   Pulse 63   Temp 98 F (36.7 C) (Temporal)   Resp 20   Ht 6' (1.829 m)   Wt 230 lb (104.3 kg)   SpO2 95%   BMI 31.19 kg/m        Assessment & Plan:  TARVARES LANT comes in today with chief complaint of Medical Management of Chronic Issues   Diagnosis and orders addressed:  1. Essential hypertension Low sodium diet - CBC with Differential/Platelet - CMP14+EGFR - lisinopril (ZESTRIL) 2.5 MG tablet; Take 1 tablet (2.5 mg total) by mouth daily.  Dispense: 90 tablet; Refill: 1 - metoprolol succinate (TOPROL-XL) 50 MG 24 hr tablet; Take 1 tablet (50 mg total) by mouth daily. Take with or immediately following a meal.  Dispense: 90 tablet; Refill: 1  2. Mixed  hyperlipidemia Low fat diet - Lipid panel - atorvastatin (LIPITOR) 40 MG tablet; Take 1 tablet (40 mg total) by mouth daily.  Dispense: 90 tablet; Refill: 1  3. Type 2 diabetes mellitus without complication, without long-term current use of insulin (HCC) Watch carbs in diet - Bayer DCA Hb A1c Waived - glipiZIDE (GLUCOTROL XL) 10 MG 24 hr tablet; Take 1 tablet (10 mg total) by mouth daily.  Dispense: 90 tablet; Refill: 1 - metFORMIN (GLUCOPHAGE) 1000 MG tablet; Take 1 tablet (1,000 mg total) by mouth 2 (two) times daily with a meal.  Dispense: 180 tablet; Refill: 1  4. Coronary atherosclerosis of autologous vein bypass graft without angina Keep follow up with cardiology  5. Gastroesophageal reflux disease without esophagitis Avoid spicy foods Do not eat 2 hours prior to bedtime - omeprazole (PRILOSEC) 40 MG capsule; Take 1 capsule (40 mg total) by mouth daily.  Dispense: 90 capsule; Refill: 1  6. CKD stage 3 due to type 2 diabetes mellitus ([place]) Labs pending  7. Malignant neoplasm of prostate [facility]) Keep follow up with urology  8. BMI 31.0-31.9,adult Discussed diet and exercise for person with BMI >25 Will recheck weight in 3-6 months   Labs pending Health Maintenance reviewed Diet and exercise encouraged  Follow up plan: 3 months   [name], FNP

## 2020-08-28 ENCOUNTER — Observation Stay (HOSPITAL_BASED_OUTPATIENT_CLINIC_OR_DEPARTMENT_OTHER): Payer: Medicare PPO

## 2020-08-28 ENCOUNTER — Encounter (INDEPENDENT_AMBULATORY_CARE_PROVIDER_SITE_OTHER): Payer: Self-pay | Admitting: Ophthalmology

## 2020-08-28 ENCOUNTER — Encounter (HOSPITAL_COMMUNITY): Payer: Self-pay

## 2020-08-28 ENCOUNTER — Ambulatory Visit (INDEPENDENT_AMBULATORY_CARE_PROVIDER_SITE_OTHER): Payer: Medicare PPO | Admitting: Ophthalmology

## 2020-08-28 ENCOUNTER — Observation Stay (HOSPITAL_COMMUNITY)
Admission: EM | Admit: 2020-08-28 | Discharge: 2020-08-30 | Disposition: A | Discharge status: Home / Self Care | Payer: Medicare PPO | Attending: Emergency Medicine | Admitting: Emergency Medicine

## 2020-08-28 ENCOUNTER — Other Ambulatory Visit: Payer: Self-pay

## 2020-08-28 ENCOUNTER — Emergency Department (HOSPITAL_COMMUNITY): Payer: Medicare PPO

## 2020-08-28 VITALS — BP 150/79

## 2020-08-28 DIAGNOSIS — H3581 Retinal edema: Secondary | ICD-10-CM

## 2020-08-28 DIAGNOSIS — I639 Cerebral infarction, unspecified: Secondary | ICD-10-CM | POA: Diagnosis not present

## 2020-08-28 DIAGNOSIS — I2510 Atherosclerotic heart disease of native coronary artery without angina pectoris: Secondary | ICD-10-CM | POA: Diagnosis not present

## 2020-08-28 DIAGNOSIS — E1122 Type 2 diabetes mellitus with diabetic chronic kidney disease: Secondary | ICD-10-CM | POA: Diagnosis present

## 2020-08-28 DIAGNOSIS — I69312 Visuospatial deficit and spatial neglect following cerebral infarction: Secondary | ICD-10-CM | POA: Diagnosis not present

## 2020-08-28 DIAGNOSIS — I129 Hypertensive chronic kidney disease with stage 1 through stage 4 chronic kidney disease, or unspecified chronic kidney disease: Secondary | ICD-10-CM | POA: Insufficient documentation

## 2020-08-28 DIAGNOSIS — Z7984 Long term (current) use of oral hypoglycemic drugs: Secondary | ICD-10-CM | POA: Diagnosis not present

## 2020-08-28 DIAGNOSIS — Z951 Presence of aortocoronary bypass graft: Secondary | ICD-10-CM | POA: Diagnosis not present

## 2020-08-28 DIAGNOSIS — I10 Essential (primary) hypertension: Secondary | ICD-10-CM | POA: Diagnosis present

## 2020-08-28 DIAGNOSIS — E1151 Type 2 diabetes mellitus with diabetic peripheral angiopathy without gangrene: Secondary | ICD-10-CM | POA: Diagnosis not present

## 2020-08-28 DIAGNOSIS — H34232 Retinal artery branch occlusion, left eye: Secondary | ICD-10-CM

## 2020-08-28 DIAGNOSIS — E782 Mixed hyperlipidemia: Secondary | ICD-10-CM

## 2020-08-28 DIAGNOSIS — H53132 Sudden visual loss, left eye: Secondary | ICD-10-CM | POA: Insufficient documentation

## 2020-08-28 DIAGNOSIS — E1169 Type 2 diabetes mellitus with other specified complication: Secondary | ICD-10-CM | POA: Diagnosis present

## 2020-08-28 DIAGNOSIS — H35033 Hypertensive retinopathy, bilateral: Secondary | ICD-10-CM

## 2020-08-28 DIAGNOSIS — Z87891 Personal history of nicotine dependence: Secondary | ICD-10-CM | POA: Diagnosis not present

## 2020-08-28 DIAGNOSIS — H25811 Combined forms of age-related cataract, right eye: Secondary | ICD-10-CM

## 2020-08-28 DIAGNOSIS — H10013 Acute follicular conjunctivitis, bilateral: Secondary | ICD-10-CM | POA: Diagnosis not present

## 2020-08-28 DIAGNOSIS — Z961 Presence of intraocular lens: Secondary | ICD-10-CM | POA: Diagnosis not present

## 2020-08-28 DIAGNOSIS — E119 Type 2 diabetes mellitus without complications: Secondary | ICD-10-CM | POA: Diagnosis not present

## 2020-08-28 DIAGNOSIS — Z7982 Long term (current) use of aspirin: Secondary | ICD-10-CM | POA: Diagnosis not present

## 2020-08-28 DIAGNOSIS — E785 Hyperlipidemia, unspecified: Secondary | ICD-10-CM | POA: Diagnosis present

## 2020-08-28 DIAGNOSIS — H34239 Retinal artery branch occlusion, unspecified eye: Secondary | ICD-10-CM | POA: Diagnosis present

## 2020-08-28 DIAGNOSIS — Z79899 Other long term (current) drug therapy: Secondary | ICD-10-CM | POA: Diagnosis not present

## 2020-08-28 DIAGNOSIS — N1831 Chronic kidney disease, stage 3a: Secondary | ICD-10-CM | POA: Diagnosis not present

## 2020-08-28 DIAGNOSIS — H547 Unspecified visual loss: Secondary | ICD-10-CM | POA: Diagnosis not present

## 2020-08-28 DIAGNOSIS — Z20822 Contact with and (suspected) exposure to covid-19: Secondary | ICD-10-CM | POA: Insufficient documentation

## 2020-08-28 DIAGNOSIS — Z8546 Personal history of malignant neoplasm of prostate: Secondary | ICD-10-CM | POA: Diagnosis not present

## 2020-08-28 DIAGNOSIS — G459 Transient cerebral ischemic attack, unspecified: Secondary | ICD-10-CM | POA: Diagnosis not present

## 2020-08-28 DIAGNOSIS — N183 Chronic kidney disease, stage 3 unspecified: Secondary | ICD-10-CM | POA: Diagnosis present

## 2020-08-28 LAB — CBC WITH DIFFERENTIAL/PLATELET
Abs Immature Granulocytes: 0.03 10*3/uL (ref 0.00–0.07)
Basophils Absolute: 0.1 10*3/uL (ref 0.0–0.1)
Basophils Relative: 1 %
Eosinophils Absolute: 0.3 10*3/uL (ref 0.0–0.5)
Eosinophils Relative: 4 %
HCT: 41 % (ref 39.0–52.0)
Hemoglobin: 13.2 g/dL (ref 13.0–17.0)
Immature Granulocytes: 0 %
Lymphocytes Relative: 16 %
Lymphs Abs: 1.3 10*3/uL (ref 0.7–4.0)
MCH: 28.1 pg (ref 26.0–34.0)
MCHC: 32.2 g/dL (ref 30.0–36.0)
MCV: 87.2 fL (ref 80.0–100.0)
Monocytes Absolute: 0.6 10*3/uL (ref 0.1–1.0)
Monocytes Relative: 7 %
Neutro Abs: 5.8 10*3/uL (ref 1.7–7.7)
Neutrophils Relative %: 72 %
Platelets: 246 10*3/uL (ref 150–400)
RBC: 4.7 MIL/uL (ref 4.22–5.81)
RDW: 13.8 % (ref 11.5–15.5)
WBC: 8 10*3/uL (ref 4.0–10.5)
nRBC: 0 % (ref 0.0–0.2)

## 2020-08-28 LAB — COMPREHENSIVE METABOLIC PANEL
ALT: 16 U/L (ref 0–44)
AST: 15 U/L (ref 15–41)
Albumin: 3.9 g/dL (ref 3.5–5.0)
Alkaline Phosphatase: 95 U/L (ref 38–126)
Anion gap: 10 (ref 5–15)
BUN: 18 mg/dL (ref 8–23)
CO2: 27 mmol/L (ref 22–32)
Calcium: 9.5 mg/dL (ref 8.9–10.3)
Chloride: 103 mmol/L (ref 98–111)
Creatinine, Ser: 1.67 mg/dL — ABNORMAL HIGH (ref 0.61–1.24)
GFR, Estimated: 43 mL/min — ABNORMAL LOW (ref >60)
Glucose, Bld: 82 mg/dL (ref 70–99)
Potassium: 4 mmol/L (ref 3.5–5.1)
Sodium: 140 mmol/L (ref 135–145)
Total Bilirubin: 0.5 mg/dL (ref 0.3–1.2)
Total Protein: 7.5 g/dL (ref 6.5–8.1)

## 2020-08-28 LAB — ECHOCARDIOGRAM COMPLETE
AR max vel: 1.87 cm2
AV Area VTI: 1.94 cm2
AV Area mean vel: 1.94 cm2
AV Mean grad: 6 mmHg
AV Peak grad: 11.3 mmHg
Ao pk vel: 1.68 m/s
Area-P 1/2: 3.02 cm2
S' Lateral: 3.5 cm
Single Plane A4C EF: 61.9 %

## 2020-08-28 LAB — LIPID PANEL
Cholesterol: 121 mg/dL (ref 0–200)
HDL: 31 mg/dL — ABNORMAL LOW (ref >40)
LDL Cholesterol: 72 mg/dL (ref 0–99)
Total CHOL/HDL Ratio: 3.9 RATIO
Triglycerides: 89 mg/dL (ref <150)
VLDL: 18 mg/dL (ref 0–40)

## 2020-08-28 LAB — RESP PANEL BY RT-PCR (FLU A&B, COVID) ARPGX2
Influenza A by PCR: NEGATIVE
Influenza B by PCR: NEGATIVE
SARS Coronavirus 2 by RT PCR: NEGATIVE

## 2020-08-28 LAB — HEMOGLOBIN A1C
Hgb A1c MFr Bld: 6.5 % — ABNORMAL HIGH (ref 4.8–5.6)
Mean Plasma Glucose: 139.85 mg/dL

## 2020-08-28 LAB — PROTIME-INR
INR: 1 (ref 0.8–1.2)
Prothrombin Time: 13.3 seconds (ref 11.4–15.2)

## 2020-08-28 LAB — C-REACTIVE PROTEIN: CRP: 0.5 mg/dL (ref <1.0)

## 2020-08-28 LAB — SEDIMENTATION RATE: Sed Rate: 40 mm/hr — ABNORMAL HIGH (ref 0–16)

## 2020-08-28 MED ORDER — LORAZEPAM 2 MG/ML IJ SOLN: 1.0000 mg | Freq: Once | INTRAMUSCULAR | Status: DC

## 2020-08-28 MED ORDER — ACETAMINOPHEN 325 MG PO TABS: 650.0000 mg | ORAL_TABLET | ORAL | Status: DC | PRN

## 2020-08-28 MED ORDER — PANTOPRAZOLE SODIUM 40 MG PO TBEC
40.0000 mg | DELAYED_RELEASE_TABLET | Freq: Every day | ORAL | Status: DC
  Administered 2020-08-29 – 2020-08-30 (×2): 40 mg via ORAL
  Filled 2020-08-28 (×2): qty 1

## 2020-08-28 MED ORDER — ENOXAPARIN SODIUM 40 MG/0.4ML IJ SOSY
40.0000 mg | PREFILLED_SYRINGE | INTRAMUSCULAR | Status: DC
  Administered 2020-08-28 – 2020-08-30 (×3): 40 mg via SUBCUTANEOUS
  Filled 2020-08-28 (×3): qty 0.4

## 2020-08-28 MED ORDER — STROKE: EARLY STAGES OF RECOVERY BOOK
Freq: Once | Status: DC
  Filled 2020-08-28: qty 1

## 2020-08-28 MED ORDER — METOPROLOL SUCCINATE ER 50 MG PO TB24
50.0000 mg | ORAL_TABLET | Freq: Every day | ORAL | Status: DC
  Administered 2020-08-29 – 2020-08-30 (×2): 50 mg via ORAL
  Filled 2020-08-28: qty 1
  Filled 2020-08-28: qty 2

## 2020-08-28 MED ORDER — SODIUM CHLORIDE 0.9 % IV SOLN: Freq: Once | INTRAVENOUS | Status: AC

## 2020-08-28 MED ORDER — ATORVASTATIN CALCIUM 40 MG PO TABS
40.0000 mg | ORAL_TABLET | Freq: Every day | ORAL | Status: DC
  Administered 2020-08-29 – 2020-08-30 (×2): 40 mg via ORAL
  Filled 2020-08-28 (×2): qty 1

## 2020-08-28 MED ORDER — ACETAMINOPHEN 650 MG RE SUPP: 650.0000 mg | RECTAL | Status: DC | PRN

## 2020-08-28 MED ORDER — ACETAMINOPHEN 160 MG/5ML PO SOLN: 650.0000 mg | ORAL | Status: DC | PRN

## 2020-08-28 MED ORDER — DIAZEPAM 2 MG PO TABS: 2.0000 mg | ORAL_TABLET | Freq: Once | ORAL | Status: DC | PRN

## 2020-08-28 MED ORDER — INSULIN ASPART 100 UNIT/ML IJ SOLN
0.0000 [IU] | Freq: Three times a day (TID) | INTRAMUSCULAR | Status: DC
  Administered 2020-08-30: 2 [IU] via SUBCUTANEOUS

## 2020-08-28 MED ORDER — ASPIRIN 325 MG PO TABS
325.0000 mg | ORAL_TABLET | Freq: Every day | ORAL | Status: DC
  Administered 2020-08-28 – 2020-08-29 (×2): 325 mg via ORAL
  Filled 2020-08-28 (×2): qty 1

## 2020-08-28 MED ORDER — ASPIRIN 300 MG RE SUPP: 300.0000 mg | Freq: Every day | RECTAL | Status: DC

## 2020-08-28 NOTE — Consult Note (Addendum)
Neurology Consultation  Reason for Consult: BRAO.  Referring Physician: B.Henderly, PA-C  CC: vision loss  History is obtained from: Patient, ophthalmology note.   HPI: Jacob Rios is a 75 y.o. male with a PMHx of HTN, CKD, CAD s/p MI and CABG, PAD and DM II, hypertensive retinopathy, and cataracts who presented from his Ophthalmology office where a BRAO was diagnosed. Patient has never had this before. Patient states that 4 days ago, he was watching TV around 0830 hours and began to notice that his eyesight was blurry. When he covered his OS, he had no difficulty seeing normally for him. He sat up and walked around, not thinking much of this, but then noticed a dark shade coming down over his OD. The shade turned from dark to more of a gray. This resolved in 2-3 minutes. For the next two days, he had no trouble whatsoever. Then yesterday, he noticed the same symptoms as before, but this time, the symptoms did not resolve. He called his eye Dr. and got an appointment for today.   Today, he states that he sees gray when looking forward. He states examiner's face is gray from the eyes to the chin, but otherwise he can see well. He has no HA, jaw claudication, palpitations, dizziness, n/v, weakness or an extremity, ataxia, or dysphagia.   Because of the BRAO, neurology was asked to consult.   ROS: A robust ROS was performed and is negative except as noted in the HPI.    Past Medical History:  Diagnosis Date   CKD (chronic kidney disease), stage III ([place])    followed by pcp   Coronary artery disease cardiologist--- dr Angelena Form   08/ 1999  s/p  cath w/ PTCA and stenting to RCA;   05/ 2000 inferior wall MI , 06-06-1998 s/p cabg x3;   Last heart cath 2005,  2 patent grafts (diagnol and RCA) and occluded LIMA--LAD graft with normal LAD nonobstructive disease;  last nuclear study 11/ 2014 no evidence ishcemia, ef 40%   Full dentures    Hiatal hernia    History of acute inferior wall MI 05/1998    s/p  cabg   Hypertension    Mixed hyperlipidemia    Nocturia more than twice per night    PAD (peripheral artery disease) (HCC)    left common iliac artery stenosis per aorta ultrasound 03/ 2021 in epic and cardiology note   Prostate cancer [facility]) urologist--- dr Jeffie Pollock   first dx 07/ 2015 in active survillance until bx 11-11-2019,  Stage T2a, Gleason 3+4, PSA 13.3   S/P CABG x 3 06/06/1998   LIMA--LAD, SVG to Diagonal, RIMA to RCA   S/P primary angioplasty with coronary stent 08/1997   stent to RCA   Type 2 diabetes mellitus ([place])    followed by pcp  (03-06-2020 checks blood sugar dialy in am,  fasting sugar-- 110-130)     Family History  Problem Relation Age of Onset   Lung cancer Mother    Dementia Father    Hyperlipidemia Father    Congestive Heart Failure Father    Post-traumatic stress disorder Son    Alcohol abuse Maternal Grandfather    Cancer Maternal Grandfather 90       stomach cancer    Heart attack Neg Hx    Stroke Neg Hx    Breast cancer Neg Hx    Colon cancer Neg Hx    Prostate cancer Neg Hx    Pancreatic cancer Neg Hx  Social History:   reports that he quit smoking about 23 years ago. His smoking use included cigarettes. He has a 180.00 pack-year smoking history. He has never used smokeless tobacco. He reports that he does not drink alcohol and does not use drugs.  Medications  Current Facility-Administered Medications:     stroke: mapping our early stages of recovery book, , Does not apply, Once, Smith, Rondell A, MD   0.9 %  sodium chloride infusion, , Intravenous, Once, Smith, Rondell A, MD   acetaminophen (TYLENOL) tablet 650 mg, 650 mg, Oral, Q4H PRN **OR** acetaminophen (TYLENOL) 160 MG/5ML solution 650 mg, 650 mg, Per Tube, Q4H PRN **OR** acetaminophen (TYLENOL) suppository 650 mg, 650 mg, Rectal, Q4H PRN, Smith, Rondell A, MD   aspirin suppository 300 mg, 300 mg, Rectal, Daily **OR** aspirin tablet 325 mg, 325 mg, Oral, Daily, Smith, Rondell A,  MD   enoxaparin (LOVENOX) injection 40 mg, 40 mg, Subcutaneous, Q24H, Smith, Rondell A, MD  Current Outpatient Medications:    aspirin 81 MG tablet, Take 81 mg by mouth daily., Disp: , Rfl:    atorvastatin (LIPITOR) 40 MG tablet, Take 1 tablet (40 mg total) by mouth daily., Disp: 90 tablet, Rfl: 1   Cholecalciferol (D3 ADULT PO), Take 1 tablet by mouth daily., Disp: , Rfl:    CRANBERRY PO, Take by mouth daily., Disp: , Rfl:    glipiZIDE (GLUCOTROL XL) 10 MG 24 hr tablet, Take 1 tablet (10 mg total) by mouth daily., Disp: 90 tablet, Rfl: 1   glucose blood (ONETOUCH VERIO) test strip, Test 1X per day and as needed  Dx 250.02, Disp: 100 each, Rfl: 12   Lancets (ONETOUCH ULTRASOFT) lancets, Patient test 1X per day and prn  Dx 250.02, Disp: 100 each, Rfl: 12   lisinopril (ZESTRIL) 2.5 MG tablet, Take 1 tablet (2.5 mg total) by mouth daily., Disp: 90 tablet, Rfl: 1   metFORMIN (GLUCOPHAGE) 1000 MG tablet, Take 1 tablet (1,000 mg total) by mouth 2 (two) times daily with a meal., Disp: 180 tablet, Rfl: 1   metoprolol succinate (TOPROL-XL) 50 MG 24 hr tablet, Take 1 tablet (50 mg total) by mouth daily. Take with or immediately following a meal., Disp: 90 tablet, Rfl: 1   omeprazole (PRILOSEC) 40 MG capsule, Take 1 capsule (40 mg total) by mouth daily., Disp: 90 capsule, Rfl: 1   vitamin C (ASCORBIC ACID) 500 MG tablet, Take 500 mg by mouth daily., Disp: , Rfl:    Exam: Current vital signs: BP (!) 151/90   Pulse 63   Temp (!) 97.4 F (36.3 C) (Oral)   Resp 13   SpO2 100%  Vital signs in last 24 hours: Temp:  [97.4 F (36.3 C)] 97.4 F (36.3 C) (08/16 1247) Pulse Rate:  [63-68] 63 (08/16 1500) Resp:  [13-17] 13 (08/16 1500) BP: (150-167)/(79-103) 151/90 (08/16 1500) SpO2:  [98 %-100 %] 100 % (08/16 1500)  PE: GENERAL: Well appearing .Awake, alert in NAD.  HEENT: normocephalic and atraumatic. LUNGS - Normal respiratory effort.  CV - RRR. ABDOMEN - Soft, nontender. Ext: warm, well  perfused. Psych: affect light.   NEURO:  Mental Status: Alert and oriented x4.  Speech/Language: speech is without dysarthria or aphasia.  Naming, repetition, fluency, and comprehension intact.  Cranial Nerves:  II: PERRL 8 mm/brisk. visual fields full.  III, IV, VI: EOMI. Lid elevation symmetric and full.  V: sensation is intact and symmetrical to face.  VII: Smile is symmetrical.  VIII:hearing intact to voice. IX,  X: palate elevation is symmetric. Phonation normal.  XI: normal sternocleidomastoid and trapezius muscle strength. LXB:WIOMBT is symmetrical without fasciculations.   Motor:  5/5 strength throughout.  Tone is normal. Bulk is normal.  Sensation- Intact to light touch bilaterally in all four extremities. Extinction absent to DSS.  Coordination: FTN intact bilaterally. No pronator drift.  Gait- Steady, up in ED room walking around without assistance.   NIHSS: 0  Labs I have reviewed labs in epic and the results pertinent to this consultation are: INR, aPTT, ESR, CRP have been ordered.  CBC    Component Value Date/Time   WBC 8.0 08/28/2020 1345   RBC 4.70 08/28/2020 1345   HGB 13.2 08/28/2020 1345   HGB 13.2 07/20/2020 1036   HCT 41.0 08/28/2020 1345   HCT 39.1 07/20/2020 1036   PLT 246 08/28/2020 1345   PLT 196 07/20/2020 1036   MCV 87.2 08/28/2020 1345   MCV 84 07/20/2020 1036   MCH 28.1 08/28/2020 1345   MCHC 32.2 08/28/2020 1345   RDW 13.8 08/28/2020 1345   RDW 13.7 07/20/2020 1036   LYMPHSABS 1.3 08/28/2020 1345   LYMPHSABS 1.1 07/20/2020 1036   MONOABS 0.6 08/28/2020 1345   EOSABS 0.3 08/28/2020 1345   EOSABS 0.4 07/20/2020 1036   BASOSABS 0.1 08/28/2020 1345   BASOSABS 0.1 07/20/2020 1036    CMP     Component Value Date/Time   NA 140 08/28/2020 1345   NA 140 07/20/2020 1036   K 4.0 08/28/2020 1345   CL 103 08/28/2020 1345   CO2 27 08/28/2020 1345   GLUCOSE 82 08/28/2020 1345   BUN 18 08/28/2020 1345   BUN 17 07/20/2020 1036   CREATININE  1.67 (H) 08/28/2020 1345   CREATININE 1.34 07/30/2012 1158   CALCIUM 9.5 08/28/2020 1345   PROT 7.5 08/28/2020 1345   PROT 6.8 07/20/2020 1036   ALBUMIN 3.9 08/28/2020 1345   ALBUMIN 4.2 07/20/2020 1036   AST 15 08/28/2020 1345   ALT 16 08/28/2020 1345   ALKPHOS 95 08/28/2020 1345   BILITOT 0.5 08/28/2020 1345   BILITOT 0.3 07/20/2020 1036   GFRNONAA 43 (L) 08/28/2020 1345   GFRNONAA 55 (L) 07/30/2012 1158   GFRAA 57 (L) 01/20/2020 0829   GFRAA 64 07/30/2012 1158    Lipid Panel     Component Value Date/Time   CHOL 101 07/20/2020 1036   CHOL 107 07/30/2012 1158   TRIG 87 07/20/2020 1036   TRIG 96 07/30/2012 1158   HDL 29 (L) 07/20/2020 1036   HDL 24 (L) 07/30/2012 1158   CHOLHDL 3.5 07/20/2020 1036   LDLCALC 55 07/20/2020 1036   LDLCALC 64 07/30/2012 1158     Imaging MD reviewed the images obtained.  CT head No acute intracranial pathology. Status post left cataract extraction. Otherwise, unremarkable globes and orbits.   Assessment: 75 yo male with stroke risk factors of HTN, DM II, atherosclerosis, CAD, and DM. Very remote tobacco use. States he has had painless vision loss in OD since Friday, off and on, who saw his ophthalmologist today and was diagnosed with BRAO and sent to ED for stroke workup. He continues to have a gray color in his vision, but he does state it is somewhat improved. Due to BRAO association with GCA, we will check inflammatory panel. Given association with embolism, will monitor with telemetry, MRA neck and check echocardiogram. He may need to wear a 30 day event monitor on discharge, but he has no history of AFib.  Impression: -BRAO.   Recommendations/Plan:  -medicine admit.  -Echocardiogram.  -Vascular US neck.  -ASA 355m po qd.  -ESR, CRP, anti coag panel.  -Lipid panel, HbA1c.  -high intensity statin or increase dose if LDL > 70. -HbA1c goal < 7.  -frequent neuro checks.  -Risk factor modification - cardiac telemetry monitoring  for arrhythmia - stroke education. - Stroke team to follow. -Consider 30 day event monitor on discharge.   Pt seen by KClance Boll NP/Neuro and later by MD. Note/plan to be edited by MD as needed.  Pager: [phone] I have seen the patient and reviewed the above note.  He has a branch retinal artery occlusion referred by ophthalmology.  He is being admitted for a stroke work-up.  Plan as above.  MRoland Rack MD [facility] [phone] If 7pm- 7am, please page neurology on call as listed in [place]

## 2020-08-28 NOTE — H&P (Signed)
History and Physical    Jacob Rios EHO:[id number] DOB: [birth year] DOA: 08/28/2020  Referring MD/NP/PA: Shelby Dubin, PA-C PCP: [name], FNP  Patient coming from: Home  Chief Complaint: vision loss in left eye  I have personally briefly reviewed patient's old medical records in [place]   HPI: Jacob Rios is a 75 y.o. male with medical history significant of HTN, HLD, CAD s/p CABG, diabetes mellitus type 2 presents with complaints of vision loss out of his left eye.  Symptoms started 4 days ago and around 8 AM.  He complained of blurry vision out of his left eye while he was watching TV, but noted everything subsequently going black like a curtain coming down.  Symptoms lasted approximately 30 minutes before he regain vision.  During this time patient reports that he had been dealing with a sinus infection and had taken some Vicks sinus medicine.  Symptoms did not reappear until yesterday a evening.  Symptoms are similar but reported x-ray showed drip across the middle of his eye field.  He went and was evaluated by his eye doctor today and diagnosed with root prescription occlusion.  He denies having any significant headache, change in speech, focal weakness, palpitations, chest pain, nausea, vomiting, or diarrhea symptoms.  ED Course: Upon admission to the emergency department patient was seen to afebrile, blood pressure 150/79-160 7/81, and O2 saturations maintained.  Labs unremarkable except for creatinine 1.67.  CT scan of the brain showed no acute abnormalities.  Neurology have been formally consulted.  TRH called to admit  Review of Systems  Constitutional:  Negative for chills and fever.  HENT:  Negative for ear discharge and ear pain.   Eyes:  Positive for blurred vision. Negative for pain.  Respiratory:  Negative for cough and shortness of breath.   Cardiovascular:  Negative for chest pain and leg swelling.  Gastrointestinal:  Negative for abdominal  pain, diarrhea and vomiting.  Genitourinary:  Negative for dysuria and hematuria.  Musculoskeletal:  Negative for falls.  Skin:  Negative for rash.  Neurological:  Negative for loss of consciousness and headaches.  All other systems reviewed and are negative.  Past Medical History:  Diagnosis Date   CKD (chronic kidney disease), stage III ([place])    followed by pcp   Coronary artery disease cardiologist--- dr Angelena Form   08/ 1999  s/p  cath w/ PTCA and stenting to RCA;   05/ 2000 inferior wall MI , 06-06-1998 s/p cabg x3;   Last heart cath 2005,  2 patent grafts (diagnol and RCA) and occluded LIMA--LAD graft with normal LAD nonobstructive disease;  last nuclear study 11/ 2014 no evidence ishcemia, ef 40%   Full dentures    Hiatal hernia    History of acute inferior wall MI 05/1998   s/p  cabg   Hypertension    Mixed hyperlipidemia    Nocturia more than twice per night    PAD (peripheral artery disease) (HCC)    left common iliac artery stenosis per aorta ultrasound 03/ 2021 in epic and cardiology note   Prostate cancer [facility]) urologist--- dr Jeffie Pollock   first dx 07/ 2015 in active survillance until bx 11-11-2019,  Stage T2a, Gleason 3+4, PSA 13.3   S/P CABG x 3 06/06/1998   LIMA--LAD, SVG to Diagonal, RIMA to RCA   S/P primary angioplasty with coronary stent 08/1997   stent to RCA   Type 2 diabetes mellitus ([place])    followed by pcp  (03-06-2020 checks  blood sugar dialy in am,  fasting sugar-- 110-130)    Past Surgical History:  Procedure Laterality Date   APPENDECTOMY  child   CARDIAC CATHETERIZATION  09/01/2000  '@[facility]'    patent RCA and Diagonal grafts, atretic LIMA, moderate nonobstructive proxLAD, normal lvsf   CARDIAC CATHETERIZATION  04-21-2003  '@[facility]'    LIMA--LAD graft occluded, native LAD with nonobstructive disease,  patent diagonal/ rca grafts   CATARACT EXTRACTION     CATARACT EXTRACTION W/ INTRAOCULAR LENS IMPLANT Right 12/2018   CORONARY ANGIOPLASTY WITH STENT PLACEMENT  08/1997   '@[facility]'    ptca w/ stenting to rca   CORONARY ARTERY BYPASS GRAFT  06-06-1998  '@[facility]'     LIMA -- LAD, SVG -- Diagonal,  RIMA to RCA   CYSTOSCOPY N/A 03/09/2020   Procedure: CYSTOSCOPY FLEXIBLE;  Surgeon: [name], MD;  Location[facility];  Service: Urology;  Laterality: N/A;  NO SEEDS FOUND IN BLADDER   INGUINAL HERNIA REPAIR Right 1974   PROSTATE BIOPSY  06/2017   x 4-5    RADIOACTIVE SEED IMPLANT N/A 03/09/2020   Procedure: RADIOACTIVE SEED IMPLANT/BRACHYTHERAPY IMPLANT;  Surgeon: [name], MD;  Location: [facility];  Service: Urology;  Laterality: N/A;   70  SEEDS IMPLANTED   SPACE OAR INSTILLATION N/A 03/09/2020   Procedure: SPACE OAR INSTILLATION;  Surgeon: [name], MD;  Location: [facility];  Service: Urology;  Laterality: N/A;   VENTRAL HERNIA REPAIR  09/ 2000 and recurrent repair 06/ 2001     reports that he quit smoking about 23 years ago. His smoking use included cigarettes. He has a 180.00 pack-year smoking history. He has never used smokeless tobacco. He reports that he does not drink alcohol and does not use drugs.  Allergies  Allergen Reactions   Morphine Shortness Of Breath and Swelling    Family History  Problem Relation Age of Onset   Lung cancer Mother    Dementia Father    Hyperlipidemia Father    Congestive Heart Failure Father    Post-traumatic stress disorder Son    Alcohol abuse Maternal Grandfather    Cancer Maternal Grandfather 90       stomach cancer    Heart attack Neg Hx    Stroke Neg Hx    Breast cancer Neg Hx    Colon cancer Neg Hx    Prostate cancer Neg Hx    Pancreatic cancer Neg Hx     Prior to Admission medications   Medication Sig Start Date End Date Taking? Authorizing Provider  aspirin 81 MG tablet Take 81 mg by mouth daily.    [provider]  atorvastatin (LIPITOR) 40 MG tablet Take 1 tablet (40 mg total) by mouth daily. 07/20/20   Hassell Done, [name], FNP  Cholecalciferol  (D3 ADULT PO) Take 1 tablet by mouth daily.    [provider]  CRANBERRY PO Take by mouth daily.    [provider]  glipiZIDE (GLUCOTROL XL) 10 MG 24 hr tablet Take 1 tablet (10 mg total) by mouth daily. 07/20/20   Hassell Done [name], FNP  glucose blood (ONETOUCH VERIO) test strip Test 1X per day and as needed  Dx 250.02 07/01/13   [name], FNP  Lancets [facility] ULTRASOFT) lancets Patient test 1X per day and prn  Dx 250.02 09/01/12   Hassell Done, [name], FNP  lisinopril (ZESTRIL) 2.5 MG tablet Take 1 tablet (2.5 mg total) by mouth daily. 07/20/20   Hassell Done [name], FNP  metFORMIN (GLUCOPHAGE) 1000 MG  tablet Take 1 tablet (1,000 mg total) by mouth 2 (two) times daily with a meal. 07/20/20   Hassell Done, [name], FNP  metoprolol succinate (TOPROL-XL) 50 MG 24 hr tablet Take 1 tablet (50 mg total) by mouth daily. Take with or immediately following a meal. 07/20/20   Hassell Done, [name], FNP  omeprazole (PRILOSEC) 40 MG capsule Take 1 capsule (40 mg total) by mouth daily. 07/20/20   Hassell Done [name], FNP  vitamin C (ASCORBIC ACID) 500 MG tablet Take 500 mg by mouth daily.    [provider]    Physical Exam:  Constitutional: Elderly male in NAD, calm, comfortable Vitals:   08/28/20 1247 08/28/20 1449 08/28/20 1500  BP: (!) 167/81 (!) 156/103 (!) 151/90  Pulse: 65 68 63  Resp: '17 16 13  ' Temp: (!) 97.4 F (36.3 C)    TempSrc: Oral    SpO2: 98% 99% 100%   Eyes: Decrease visual field out of left eye ENMT: Mucous membranes are moist. Posterior pharynx clear of any exudate or lesions.Normal dentition.  Neck: normal, supple, no masses, no thyromegaly Respiratory: clear to auscultation bilaterally, no wheezing, no crackles. Normal respiratory effort. No accessory muscle use.  Cardiovascular: Regular rate and rhythm, no murmurs / rubs / gallops. No extremity edema. 2+ pedal pulses. No carotid bruits.  Abdomen: no tenderness, no masses palpated. No  hepatosplenomegaly. Bowel sounds positive.  Musculoskeletal: no clubbing / cyanosis. No joint deformity upper and lower extremities. Good ROM, no contractures. Normal muscle tone.  Skin: no rashes, lesions, ulcers. No induration Neurologic: Strength 5/5 in all 4.  Psychiatric: Normal judgment and insight. Alert and oriented x 3. Normal mood.     Labs on Admission: I have personally reviewed following labs and imaging studies  CBC: Recent Labs  Lab 08/28/20 1345  WBC 8.0  NEUTROABS 5.8  HGB 13.2  HCT 41.0  MCV 87.2  PLT 353   Basic Metabolic Panel: Recent Labs  Lab 08/28/20 1345  NA 140  K 4.0  CL 103  CO2 27  GLUCOSE 82  BUN 18  CREATININE 1.67*  CALCIUM 9.5   GFR: CrCl cannot be calculated (Unknown ideal weight.). Liver Function Tests: Recent Labs  Lab 08/28/20 1345  AST 15  ALT 16  ALKPHOS 95  BILITOT 0.5  PROT 7.5  ALBUMIN 3.9   No results for input(s): LIPASE, AMYLASE in the last 168 hours. No results for input(s): AMMONIA in the last 168 hours. Coagulation Profile: No results for input(s): INR, PROTIME in the last 168 hours. Cardiac Enzymes: No results for input(s): CKTOTAL, CKMB, CKMBINDEX, TROPONINI in the last 168 hours. BNP (last 3 results) No results for input(s): PROBNP in the last 8760 hours. HbA1C: No results for input(s): HGBA1C in the last 72 hours. CBG: No results for input(s): GLUCAP in the last 168 hours. Lipid Profile: No results for input(s): CHOL, HDL, LDLCALC, TRIG, CHOLHDL, LDLDIRECT in the last 72 hours. Thyroid Function Tests: No results for input(s): TSH, T4TOTAL, FREET4, T3FREE, THYROIDAB in the last 72 hours. Anemia Panel: No results for input(s): VITAMINB12, FOLATE, FERRITIN, TIBC, IRON, RETICCTPCT in the last 72 hours. Urine analysis:    Component Value Date/Time   COLORURINE AMBER (A) 07/15/2018 1202   APPEARANCEUR Clear 07/05/2020 0954   LABSPEC 1.026 07/15/2018 1202   PHURINE 5.0 07/15/2018 1202   GLUCOSEU  Negative 07/05/2020 0954   HGBUR NEGATIVE 07/15/2018 1202   BILIRUBINUR Negative 07/05/2020 0954   KETONESUR NEGATIVE 07/15/2018 1202   PROTEINUR 1+ (A) 07/05/2020 6144  PROTEINUR 100 (A) 07/15/2018 1202   UROBILINOGEN 0.2 04/08/2019 1118   NITRITE Negative 07/05/2020 0954   NITRITE NEGATIVE 07/15/2018 1202   LEUKOCYTESUR Negative 07/05/2020 0954   LEUKOCYTESUR LARGE (A) 07/15/2018 1202   Sepsis Labs: No results found for this or any previous visit (from the past 240 hour(s)).   Radiological Exams on Admission: CT HEAD WO CONTRAST (5MM)  Result Date: 08/28/2020 CLINICAL DATA:  Monocular vision loss EXAM: CT HEAD WITHOUT CONTRAST TECHNIQUE: Contiguous axial images were obtained from the base of the skull through the vertex without intravenous contrast. COMPARISON:  Head CT 05/16/2004 FINDINGS: Brain: There is no evidence of acute intracranial hemorrhage, extra-axial fluid collection, or infarct. Vascular: There is calcification of the bilateral cavernous ICAs. Skull: Normal. Negative for fracture or focal lesion. Sinuses/Orbits: There is mucosal thickening throughout the ethmoid air cells. There is a right mastoid effusion. The patient is status post left cataract extraction. The globes and orbits are otherwise unremarkable. Other: None. IMPRESSION: 1. No acute intracranial pathology. 2. Status post left cataract extraction. Otherwise, unremarkable globes and orbits. Electronically Signed   By: Valetta Mole M.D.   On: 08/28/2020 14:40   OCT, Retina - OU - Both Eyes  Result Date: 08/28/2020 Right Eye Quality was good. Central Foveal Thickness: 287. Progression has no prior data. Findings include normal foveal contour, no IRF, no SRF, vitreomacular adhesion . Left Eye Quality was good. Central Foveal Thickness: 307. Progression has no prior data. Findings include normal foveal contour, no IRF, no SRF, intraretinal hyper-reflective material, vitreomacular adhesion (Diffuse inner retinal hyper  reflectivity superior macula extending to fovea, partial PVD). Notes *Images captured and stored on drive Diagnosis / Impression: OD: NFP, no IRF/SRF OS: BRAO affecting superior macula and fovea Clinical management: See below Abbreviations: NFP - Normal foveal profile. CME - cystoid macular edema. PED - pigment epithelial detachment. IRF - intraretinal fluid. SRF - subretinal fluid. EZ - ellipsoid zone. ERM - epiretinal membrane. ORA - outer retinal atrophy. ORT - outer retinal tubulation. SRHM - subretinal hyper-reflective material. IRHM - intraretinal hyper-reflective material   Fluorescein Angiography Optos (Transit OS)  Result Date: 08/28/2020 Right Eye Progression has no prior data. Early phase findings include normal observations. Mid/Late phase findings include normal observations. Left Eye Progression has no prior data. Early phase findings include delayed filling (Severely delayed filling and venous return). Mid/Late phase findings include normal observations. Notes **Images stored on drive** Impression: OD: normal study OS: superior BRAO -- Severely delayed filling and venous return    EKG: Independently reviewed.  Normal sinus rhythm at 68 bpm  Assessment/Plan Retinal artery branch occlusion: Acute.  Patient presents with complaints of partial vision loss out of his left eye.  Seen by ophthalmology and noted to have branch retinal artery occlusion. -Admit to telemetry bed -Stroke order set initiated -Neuro checks -Check ESR, CRP, hemoglobin A1c, lipid -Check echocardiogram and carotid vascular ultrasound -PT/OT to evaluate and treat -Check Hemoglobin A1c and lipid panel in a.m. -ASA -Follow-up telemetry overnight and likely needs to be sent home with Holter monitor -Appreciate neurology consultative services, will follow-up further recommendation  Essential hypertension -Held lisinopril due to renal insufficiency -Resume metoprolol tomorrow  Chronic kidney disease stage IIIa:  Creatinine 1.67 on admission -Continue to monitor kidney function  Diabetes mellitus type 2: Hemoglobin A1c 6.5 -Hypoglycemic protocols -Follow-up hemoglobin A1c -Hold metformin and glipizide -CBGs before every meal with sensitive SSI  Hyperlipidemia -Check lipid panel -Continue atorvastatin  History of prostate cancer  DVT prophylaxis:  Lovenox Code Status: Full Family Communication: None Disposition Plan: Home Consults called: Neurology Admission status: Observation, requiring less than 2 midnight stay  Norval Morton MD [facility]   If 7PM-7AM, please contact night-coverage   08/28/2020, 4:00 PM

## 2020-08-28 NOTE — Progress Notes (Signed)
  Echocardiogram 2D Echocardiogram has been performed.  Jacob Rios F 08/28/2020, 5:15 PM

## 2020-08-28 NOTE — Progress Notes (Signed)
[facility] Note  08/28/2020     CHIEF COMPLAINT Patient presents for Retina Evaluation   HISTORY OF PRESENT ILLNESS: Jacob Rios is a 75 y.o. male who presents to the clinic today for:   HPI     Retina Evaluation   In left eye.  This started 4 days ago.  Duration of 4 days.  Associated Symptoms Flashes, Floaters and Blind Spot.  Negative for Distortion, Pain, Redness, Photophobia, Glare, Trauma, Scalp Tenderness, Jaw Claudication, Shoulder/Hip pain, Fever, Weight Loss and Fatigue.  Context:  distance vision, mid-range vision and near vision.  Treatments tried include no treatments.  I, the attending physician,  performed the HPI with the patient and updated documentation appropriately.        Comments   Patient states had sudden loss of vision OS, starting 08.12.2022. Vision went black for 2-3 minutes. Gradually vision improved. Then, yesterday, blurry vision returned OS. Sees spots moving in and out of vision that look like fingerprints. When patient closes OD, lower half of vision is blurry OS. Patient had heart attack in 1999, stents with triple bypass. No history of stroke. Patient is NIDDM for the past 5 years. Last a1c was checked 2 weeks ago, and a1c was less than 7. Had CE with IOL OS several years ago.        Last edited by Bernarda Caffey, MD on 08/28/2020 11:18 AM.    Pt is here on the referral of Dr. Anthony Sar for concern of BRAO, pt states on Friday morning, he was watching TV and his left eye vision became blurry and turned almost completely black, he states he then started seeing "stars" in his vision that were different colors, after a few minutes the stars went away and his vision return, pt states his vision was fine over the weekend, but yesterday afternoon, his left eye become blurry and grey again, pt had a heart attack in 2000, but is not on blood thinners, only aspirin, pt denies chest pain, headaches, numbness or tingling, pt states  he took some new medication on Thursday for sinus pain/pressure, pt had OS cataract sx about a year ago with Dr. Brigitte Pulse  Referring physician: Harlen Labs, MD [address] [place],   [id number]  HISTORICAL INFORMATION:   Selected notes from the MEDICAL RECORD NUMBER Referred by Dr. Marin Comment for BRAO OS LEE:  Ocular Hx- PMH-    CURRENT MEDICATIONS: No current outpatient medications on file. (Ophthalmic Drugs)   No current facility-administered medications for this visit. (Ophthalmic Drugs)   Current Outpatient Medications (Other)  Medication Sig   aspirin 81 MG tablet Take 81 mg by mouth daily.   atorvastatin (LIPITOR) 40 MG tablet Take 1 tablet (40 mg total) by mouth daily.   Cholecalciferol (D3 ADULT PO) Take 1 tablet by mouth daily.   CRANBERRY PO Take by mouth daily.   glipiZIDE (GLUCOTROL XL) 10 MG 24 hr tablet Take 1 tablet (10 mg total) by mouth daily.   glucose blood (ONETOUCH VERIO) test strip Test 1X per day and as needed  Dx 250.02   Lancets (ONETOUCH ULTRASOFT) lancets Patient test 1X per day and prn  Dx 250.02   lisinopril (ZESTRIL) 2.5 MG tablet Take 1 tablet (2.5 mg total) by mouth daily.   metFORMIN (GLUCOPHAGE) 1000 MG tablet Take 1 tablet (1,000 mg total) by mouth 2 (two) times daily with a meal.   metoprolol succinate (TOPROL-XL) 50 MG 24 hr tablet  Take 1 tablet (50 mg total) by mouth daily. Take with or immediately following a meal.   omeprazole (PRILOSEC) 40 MG capsule Take 1 capsule (40 mg total) by mouth daily.   vitamin C (ASCORBIC ACID) 500 MG tablet Take 500 mg by mouth daily.   No current facility-administered medications for this visit. (Other)      REVIEW OF SYSTEMS: ROS   Positive for: Endocrine, Cardiovascular, Eyes Negative for: Constitutional, Gastrointestinal, Neurological, Skin, Genitourinary, Musculoskeletal, HENT, Respiratory, Psychiatric, Allergic/Imm, Heme/Lymph Last edited by Roselee Nova D, COT on 08/28/2020 11:04 AM.        ALLERGIES Allergies  Allergen Reactions   Morphine Shortness Of Breath and Swelling    PAST MEDICAL HISTORY Past Medical History:  Diagnosis Date   CKD (chronic kidney disease), stage III ([place])    followed by pcp   Coronary artery disease cardiologist--- dr Angelena Form   08/ 1999  s/p  cath w/ PTCA and stenting to RCA;   05/ 2000 inferior wall MI , 06-06-1998 s/p cabg x3;   Last heart cath 2005,  2 patent grafts (diagnol and RCA) and occluded LIMA--LAD graft with normal LAD nonobstructive disease;  last nuclear study 11/ 2014 no evidence ishcemia, ef 40%   Full dentures    Hiatal hernia    History of acute inferior wall MI 05/1998   s/p  cabg   Hypertension    Mixed hyperlipidemia    Nocturia more than twice per night    PAD (peripheral artery disease) (HCC)    left common iliac artery stenosis per aorta ultrasound 03/ 2021 in epic and cardiology note   Prostate cancer [facility]) urologist--- dr Jeffie Pollock   first dx 07/ 2015 in active survillance until bx 11-11-2019,  Stage T2a, Gleason 3+4, PSA 13.3   S/P CABG x 3 06/06/1998   LIMA--LAD, SVG to Diagonal, RIMA to RCA   S/P primary angioplasty with coronary stent 08/1997   stent to RCA   Type 2 diabetes mellitus ([place])    followed by pcp  (03-06-2020 checks blood sugar dialy in am,  fasting sugar-- 110-130)   Past Surgical History:  Procedure Laterality Date   APPENDECTOMY  child   CARDIAC CATHETERIZATION  09/01/2000  _0    patent RCA and Diagonal grafts, atretic LIMA, moderate nonobstructive proxLAD, normal lvsf   CARDIAC CATHETERIZATION  04-21-2003  _1    LIMA--LAD graft occluded, native LAD with nonobstructive disease,  patent diagonal/ rca grafts   CATARACT EXTRACTION     CATARACT EXTRACTION W/ INTRAOCULAR LENS IMPLANT Right 12/2018   CORONARY ANGIOPLASTY WITH STENT PLACEMENT  08/1997  _2    ptca w/ stenting to rca   CORONARY ARTERY BYPASS GRAFT  06-06-1998  _3     LIMA -- LAD, SVG -- Diagonal,  RIMA to RCA   CYSTOSCOPY N/A  03/09/2020   Procedure: CYSTOSCOPY FLEXIBLE;  Surgeon: [name], MD;  Location: [facility];  Service: Urology;  Laterality: N/A;  NO SEEDS FOUND IN BLADDER   INGUINAL HERNIA REPAIR Right 1974   PROSTATE BIOPSY  06/2017   x 4-5    RADIOACTIVE SEED IMPLANT N/A 03/09/2020   Procedure: RADIOACTIVE SEED IMPLANT/BRACHYTHERAPY IMPLANT;  Surgeon: [name], MD;  Location: [facility];  Service: Urology;  Laterality: N/A;   70  SEEDS IMPLANTED   SPACE OAR INSTILLATION N/A 03/09/2020   Procedure: SPACE OAR INSTILLATION;  Surgeon: [name], MD;  Location: [facility];  Service: Urology;  Laterality: N/A;   VENTRAL HERNIA REPAIR  09/ 2000 and recurrent repair 06/ 2001    FAMILY HISTORY Family History  Problem Relation Age of Onset   Lung cancer Mother    Dementia Father    Hyperlipidemia Father    Congestive Heart Failure Father    Post-traumatic stress disorder Son    Alcohol abuse Maternal Grandfather    Cancer Maternal Grandfather 90       stomach cancer    Heart attack Neg Hx    Stroke Neg Hx    Breast cancer Neg Hx    Colon cancer Neg Hx    Prostate cancer Neg Hx    Pancreatic cancer Neg Hx     SOCIAL HISTORY Social History   Tobacco Use   Smoking status: Former    Packs/day: 4.00    Years: 45.00    Pack years: 180.00    Types: Cigarettes    Quit date: 01/13/1997    Years since quitting: 23.6   Smokeless tobacco: Never   Tobacco comments:    reports he began smoking at age 30  Vaping Use   Vaping Use: Never used  Substance Use Topics   Alcohol use: No   Drug use: Never     Vitals:   08/28/20 1130  BP: (!) 150/79      OPHTHALMIC EXAM:  Base Eye Exam     Visual Acuity (Snellen - Linear)       Right Left   Dist [place] 20/40 20/800   Dist ph [place] 20/30 +2 20/50  Re-checked pinhole multiple times OS        Tonometry (Tonopen, 11:15 AM)       Right Left   Pressure 18 14         Pupils       Dark Light  Shape React APD   Right 8 8 Round None None   Left 8 8 Round None None  Dilated OU. No APD noted OS, but probable        Visual Fields (Counting fingers)       Left Right     Full   Restrictions Total inferior nasal deficiency          Extraocular Movement       Right Left    Full, Ortho Full, Ortho  Small lag with OS        Neuro/Psych     Oriented x3: Yes   Mood/Affect: Normal         Dilation     Both eyes: 1.0% Mydriacyl, 2.5% Phenylephrine @ 11:15 AM           Slit Lamp and Fundus Exam     Slit Lamp Exam       Right Left   Lids/Lashes Dermatochalasis - upper lid Dermatochalasis - upper lid   Conjunctiva/Sclera nasal and temporal pinguecula nasal and temporal pinguecula   Cornea 1+ Punctate epithelial erosions 1+ Punctate epithelial erosions, well healed cataract wound   Anterior Chamber deep, clear, narrow temporal angle deep, clear, narrow temporal angle   Iris Round and dilated, mild PPM inferiorly Round and dilated, mild PPM inferiorly   Lens 2+ Nuclear sclerosis, 2+ Cortical cataract, trace Posterior subcapsular cataract PC IOL in good position   Vitreous Vitreous syneresis Vitreous syneresis         Fundus Exam       Right Left   Disc Pink and Sharp Pink and Sharp, mild PPP   C/D Ratio 0.5 0.5   Macula Flat, Blunted  foveal reflex, No heme or edema Flat, Blunted foveal reflex, mild retinal whitening superior macula and fovea, no heme   Vessels attenuated, Tortuous attenuated, Tortuous, no emboli    Periphery Attached, No heme  Attached, No heme            Refraction     Manifest Refraction       Sphere Cylinder Axis Dist VA   Right -1.00 +0.75 180 20/40-2   Left -0.25 Sphere  20/800            IMAGING AND PROCEDURES  Imaging and Procedures for 08/28/2020  OCT, Retina - OU - Both Eyes       Right Eye Quality was good. Central Foveal Thickness: 287. Progression has no prior data. Findings include normal foveal  contour, no IRF, no SRF, vitreomacular adhesion .   Left Eye Quality was good. Central Foveal Thickness: 307. Progression has no prior data. Findings include normal foveal contour, no IRF, no SRF, intraretinal hyper-reflective material, vitreomacular adhesion (Diffuse inner retinal hyper reflectivity superior macula extending to fovea, partial PVD).   Notes *Images captured and stored on drive  Diagnosis / Impression:  OD: NFP, no IRF/SRF OS: BRAO affecting superior macula and fovea  Clinical management:  See below  Abbreviations: NFP - Normal foveal profile. CME - cystoid macular edema. PED - pigment epithelial detachment. IRF - intraretinal fluid. SRF - subretinal fluid. EZ - ellipsoid zone. ERM - epiretinal membrane. ORA - outer retinal atrophy. ORT - outer retinal tubulation. SRHM - subretinal hyper-reflective material. IRHM - intraretinal hyper-reflective material      Fluorescein Angiography Optos (Transit OS)       Right Eye Progression has no prior data. Early phase findings include normal observations. Mid/Late phase findings include normal observations.   Left Eye Progression has no prior data. Early phase findings include delayed filling (Severely delayed filling and venous return). Mid/Late phase findings include normal observations.   Notes **Images stored on drive**  Impression: OD: normal study OS: superior BRAO -- Severely delayed filling and venous return            ASSESSMENT/PLAN:    ICD-10-CM   1. BRAO (branch retinal artery occlusion), left  H34.232     2. Retinal edema  H35.81 OCT, Retina - OU - Both Eyes    3. Essential hypertension  I10     4. Hypertensive retinopathy of both eyes  H35.033 Fluorescein Angiography Optos (Transit OS)    5. Combined forms of age-related cataract of right eye  H25.811     6. Pseudophakia  Z96.1       1,2. BRAO OS - pt reports 2 day history of painless inferior visual field loss OS - exam shows mild  retinal whitening of superior macula and fovea; no emboli noted - BCVA 20/50 (PH), IOP 14 - OCT shows BRAO effecting superior macula and fovea - FA 8.16.22 shows severely delayed filling time, but eventual perfusion of all retinal vessels OS - BP 150/79 - ROS for GCA negative -- no headache, jaw claudication, scalp tenderness, numbness/tingling - discussed findings, prognosis - no retinal or ophthalmic intervention indicated or recommended - recommend urgent referral to [facility] ED for stroke work up: MRI brain, possible carotid ultrasound, possible echocardiogram - recommend ESR, CRP to r/o GCA and possible hypercoag work up - will need optimization of CV risk factors -- pt is established with Cardiology due to history of CAD s/p CABG - F/U 3-4 weeks -- DFE/OCT  3,4.  Hypertensive retinopathy OU - discussed importance of tight BP control - monitor  5. Mixed Cataract OD - The symptoms of cataract, surgical options, and treatments and risks were discussed with patient. - discussed diagnosis and progression - not yet visually significant - monitor for now  6. Pseudophakia OS  - s/p CE/IOL (Dr. Manuella Ghazi, CE[facility], Dec. 2020)  - IOL in good position, doing well  - monitor   Ophthalmic Meds Ordered this visit:  No orders of the defined types were placed in this encounter.      Return for f/u 3-4 weeks, BRAO OS, DFE, OCT.  There are no Patient Instructions on file for this visit.   Explained the diagnoses, plan, and follow up with the patient and they expressed understanding.  Patient expressed understanding of the importance of proper follow up care.   This document serves as a record of services personally performed by Gardiner Sleeper, MD, PhD. It was created on their behalf by San Jetty. Owens Shark, OA an ophthalmic technician. The creation of this record is the provider's dictation and/or activities during the visit.    Electronically signed by: San Jetty. Marguerita Merles 08.16.2022 12:29  PM   Gardiner Sleeper, M.D., Ph.D. Diseases & Surgery of the Retina and Vitreous [facility]  I have reviewed the above documentation for accuracy and completeness, and I agree with the above. Gardiner Sleeper, M.D., Ph.D. 08/28/20 12:29 PM   Abbreviations: M myopia (nearsighted); A astigmatism; H hyperopia (farsighted); P presbyopia; Mrx spectacle prescription;  CTL contact lenses; OD right eye; OS left eye; OU both eyes  XT exotropia; ET esotropia; PEK punctate epithelial keratitis; PEE punctate epithelial erosions; DES dry eye syndrome; MGD meibomian gland dysfunction; ATs artificial tears; PFAT's preservative free artificial tears; [place] nuclear sclerotic cataract; PSC posterior subcapsular cataract; ERM epi-retinal membrane; PVD posterior vitreous detachment; RD retinal detachment; DM diabetes mellitus; DR diabetic retinopathy; NPDR non-proliferative diabetic retinopathy; PDR proliferative diabetic retinopathy; CSME clinically significant macular edema; DME diabetic macular edema; dbh dot blot hemorrhages; CWS cotton wool spot; POAG primary open angle glaucoma; C/D cup-to-disc ratio; HVF humphrey visual field; GVF goldmann visual field; OCT optical coherence tomography; IOP intraocular pressure; BRVO Branch retinal vein occlusion; CRVO central retinal vein occlusion; CRAO central retinal artery occlusion; BRAO branch retinal artery occlusion; RT retinal tear; SB scleral buckle; PPV pars plana vitrectomy; VH Vitreous hemorrhage; PRP panretinal laser photocoagulation; IVK intravitreal kenalog; VMT vitreomacular traction; MH Macular hole;  NVD neovascularization of the disc; NVE neovascularization elsewhere; AREDS age related eye disease study; ARMD age related macular degeneration; POAG primary open angle glaucoma; EBMD epithelial/anterior basement membrane dystrophy; ACIOL anterior chamber intraocular lens; IOL intraocular lens; PCIOL posterior chamber intraocular lens; Phaco/IOL  phacoemulsification with intraocular lens placement; [place] photorefractive keratectomy; LASIK laser assisted in situ keratomileusis; HTN hypertension; DM diabetes mellitus; COPD chronic obstructive pulmonary disease

## 2020-08-28 NOTE — ED Provider Notes (Signed)
Emergency Medicine Provider Triage Evaluation Note  Jacob Rios , a 75 y.o. male  was evaluated in triage.  Pt complains of visual loss left eye since Friday.  Pt saw eye doctor today who sent him here   Review of Systems  Positive: Visual loss left eye,  comes and goes  Negative: No weakness  Physical Exam  BP (!) 167/81 (BP Location: Right Arm)   Pulse 65   Temp (!) 97.4 F (36.3 C) (Oral)   Resp 17   SpO2 98%  Gen:   Awake, no distress   Resp:  Normal effort  MSK:   Moves extremities without difficulty  Other:  Bilat eyes dilated   Medical Decision Making  Medically screening exam initiated at 1:34 PM.  Appropriate orders placed.  [name] was informed that the remainder of the evaluation will be completed by another provider, this initial triage assessment does not replace that evaluation, and the importance of remaining in the ED until their evaluation is complete.     Fransico Meadow, [place] 08/28/20 1335    Blanchie Dessert, MD 08/28/20 1438

## 2020-08-28 NOTE — ED Notes (Signed)
Pt gone to echo

## 2020-08-28 NOTE — ED Triage Notes (Signed)
Pt reports intermittent blurred vision since Friday, today he says he can only see the top half of his visual field in the left eye, denies issues in the right eye. Pt denies dizziness or headache.

## 2020-08-28 NOTE — ED Provider Notes (Signed)
[facility] EMERGENCY DEPARTMENT Provider Note   CSN: [id number] Arrival date & time: 08/28/20  1241     History Chief Complaint  Patient presents with   Visual Field Change    Jacob Rios is a 75 y.o. male with past medical history significant for CKD, CAD, prior MI, PAD, CABG, diabetes who presents for evaluation of painless vision loss.  Patient states on Friday he initially had some blurred vision and then had complete vision loss in his left eye.  This lasted approximately 3 to 5 minutes.  Patient states his vision returned to normal and then yesterday approximately 4 PM he had some blurred vision and vision loss to inferior aspect of his left eye.  Patient states his vision is not completely "black" however is "extremely gray" to the inferior aspect.  He denies any pain.  No headache, jaw claudication.  No prior history of CVA.  He is not anticoagulated.  No HA, facial droop, jaw pain, numbness, tingling, weakness, chest pain, shortness of breath, abdominal pain.  Denies additional aggravating or alleviating factors.  Not currently followed by neurology.  Seen by Dr. Coralyn Pear with retina specialist today.  There was concern for BRAO affecting superior macula and fovea.  There is some delayed filling time of his retinal vessels.  Per ophthalmology note recommended referral for stroke work-up at the hospital as well as ESR, CRP to rule out GCA, as well as hypercoagulability work-up and optimization of CV risk factors.  History obtained from patient and past medical records.  No interpreter used.  PCP- Josie Saunders  HPI     Past Medical History:  Diagnosis Date   CKD (chronic kidney disease), stage III ([place])    followed by pcp   Coronary artery disease cardiologist--- dr Angelena Form   08/ 1999  s/p  cath w/ PTCA and stenting to RCA;   05/ 2000 inferior wall MI , 06-06-1998 s/p cabg x3;   Last heart cath 2005,  2 patent grafts (diagnol and RCA) and occluded  LIMA--LAD graft with normal LAD nonobstructive disease;  last nuclear study 11/ 2014 no evidence ishcemia, ef 40%   Full dentures    Hiatal hernia    History of acute inferior wall MI 05/1998   s/p  cabg   Hypertension    Mixed hyperlipidemia    Nocturia more than twice per night    PAD (peripheral artery disease) (HCC)    left common iliac artery stenosis per aorta ultrasound 03/ 2021 in epic and cardiology note   Prostate cancer [facility]) urologist--- dr Jeffie Pollock   first dx 07/ 2015 in active survillance until bx 11-11-2019,  Stage T2a, Gleason 3+4, PSA 13.3   S/P CABG x 3 06/06/1998   LIMA--LAD, SVG to Diagonal, RIMA to RCA   S/P primary angioplasty with coronary stent 08/1997   stent to RCA   Type 2 diabetes mellitus ([place])    followed by pcp  (03-06-2020 checks blood sugar dialy in am,  fasting sugar-- 110-130)    Patient Active Problem List   Diagnosis Date Noted   Malignant neoplasm of prostate ([place]) 12/20/2019   CKD stage 3 due to type 2 diabetes mellitus ([place]) 08/18/2016   Gastroesophageal reflux disease without esophagitis 02/19/2015   Hyperlipemia 07/20/2014   BMI 35.0-35.9,adult 07/20/2014   Diabetes mellitus ([place]) 10/21/2010   Essential hypertension 10/26/2008   CAD, AUTOLOGOUS BYPASS GRAFT 10/26/2008    Past Surgical History:  Procedure Laterality Date   APPENDECTOMY  child  CARDIAC CATHETERIZATION  09/01/2000  _0    patent RCA and Diagonal grafts, atretic LIMA, moderate nonobstructive proxLAD, normal lvsf   CARDIAC CATHETERIZATION  04-21-2003  _1    LIMA--LAD graft occluded, native LAD with nonobstructive disease,  patent diagonal/ rca grafts   CATARACT EXTRACTION     CATARACT EXTRACTION W/ INTRAOCULAR LENS IMPLANT Right 12/2018   CORONARY ANGIOPLASTY WITH STENT PLACEMENT  08/1997  _2    ptca w/ stenting to rca   CORONARY ARTERY BYPASS GRAFT  06-06-1998  _3     LIMA -- LAD, SVG -- Diagonal,  RIMA to RCA   CYSTOSCOPY N/A 03/09/2020   Procedure: CYSTOSCOPY  FLEXIBLE;  Surgeon: [name], MD;  Location: [facility];  Service: Urology;  Laterality: N/A;  NO SEEDS FOUND IN BLADDER   INGUINAL HERNIA REPAIR Right 1974   PROSTATE BIOPSY  06/2017   x 4-5    RADIOACTIVE SEED IMPLANT N/A 03/09/2020   Procedure: RADIOACTIVE SEED IMPLANT/BRACHYTHERAPY IMPLANT;  Surgeon: [name], MD;  Location: [facility];  Service: Urology;  Laterality: N/A;   70  SEEDS IMPLANTED   SPACE OAR INSTILLATION N/A 03/09/2020   Procedure: SPACE OAR INSTILLATION;  Surgeon: [name], MD;  Location: [facility];  Service: Urology;  Laterality: N/A;   VENTRAL HERNIA REPAIR  09/ 2000 and recurrent repair 06/ 2001       Family History  Problem Relation Age of Onset   Lung cancer Mother    Dementia Father    Hyperlipidemia Father    Congestive Heart Failure Father    Post-traumatic stress disorder Son    Alcohol abuse Maternal Grandfather    Cancer Maternal Grandfather 90       stomach cancer    Heart attack Neg Hx    Stroke Neg Hx    Breast cancer Neg Hx    Colon cancer Neg Hx    Prostate cancer Neg Hx    Pancreatic cancer Neg Hx     Social History   Tobacco Use   Smoking status: Former    Packs/day: 4.00    Years: 45.00    Pack years: 180.00    Types: Cigarettes    Quit date: 01/13/1997    Years since quitting: 23.6   Smokeless tobacco: Never   Tobacco comments:    reports he began smoking at age 75  Vaping Use   Vaping Use: Never used  Substance Use Topics   Alcohol use: No   Drug use: Never    Home Medications Prior to Admission medications   Medication Sig Start Date End Date Taking? Authorizing Provider  aspirin 81 MG tablet Take 81 mg by mouth daily.    [provider]  atorvastatin (LIPITOR) 40 MG tablet Take 1 tablet (40 mg total) by mouth daily. 07/20/20   Hassell Done, [name], FNP  Cholecalciferol (D3 ADULT PO) Take 1 tablet by mouth daily.    [provider]  CRANBERRY PO  Take by mouth daily.    [provider]  glipiZIDE (GLUCOTROL XL) 10 MG 24 hr tablet Take 1 tablet (10 mg total) by mouth daily. 07/20/20   Hassell Done [name], FNP  glucose blood (ONETOUCH VERIO) test strip Test 1X per day and as needed  Dx 250.02 07/01/13   [name], FNP  Lancets [facility] ULTRASOFT) lancets Patient test 1X per day and prn  Dx 250.02 09/01/12   Hassell Done, [name], FNP  lisinopril (ZESTRIL) 2.5 MG tablet Take 1 tablet (2.5 mg total) by mouth  daily. 07/20/20   [name], FNP  metFORMIN (GLUCOPHAGE) 1000 MG tablet Take 1 tablet (1,000 mg total) by mouth 2 (two) times daily with a meal. 07/20/20   Hassell Done, [name], FNP  metoprolol succinate (TOPROL-XL) 50 MG 24 hr tablet Take 1 tablet (50 mg total) by mouth daily. Take with or immediately following a meal. 07/20/20   Hassell Done, [name], FNP  omeprazole (PRILOSEC) 40 MG capsule Take 1 capsule (40 mg total) by mouth daily. 07/20/20   Hassell Done [name], FNP  vitamin C (ASCORBIC ACID) 500 MG tablet Take 500 mg by mouth daily.    [provider]    Allergies    Morphine  Review of Systems   Review of Systems  Constitutional: Negative.   HENT: Negative.    Eyes:  Positive for visual disturbance.  Respiratory: Negative.    Cardiovascular: Negative.   Gastrointestinal: Negative.   Genitourinary: Negative.   Musculoskeletal: Negative.   Skin: Negative.   Neurological: Negative.   All other systems reviewed and are negative.  Physical Exam Updated Vital Signs BP (!) 151/90   Pulse 63   Temp (!) 97.4 F (36.3 C) (Oral)   Resp 13   SpO2 100%   Physical Exam Vitals and nursing note reviewed.  Constitutional:      General: He is not in acute distress.    Appearance: He is well-developed. He is not ill-appearing, toxic-appearing or diaphoretic.  HENT:     Head: Normocephalic and atraumatic.     Jaw: There is normal jaw occlusion.     Comments: No facial pain No jaw  claudication    Nose: Nose normal.     Mouth/Throat:     Mouth: Mucous membranes are moist.  Eyes:     General: Visual field deficit present.     Extraocular Movements: Extraocular movements intact.     Conjunctiva/sclera: Conjunctivae normal.     Pupils: Pupils are equal, round, and reactive to light.     Visual Fields: Right eye visual fields normal.     Comments: Dilated pupils bilateral (came from Optho) EOM intact Decreased vision inferior vision left eye   Cardiovascular:     Rate and Rhythm: Normal rate and regular rhythm.     Pulses: Normal pulses.  Pulmonary:     Effort: Pulmonary effort is normal. No respiratory distress.     Breath sounds: Normal breath sounds.  Abdominal:     General: There is no distension.     Palpations: Abdomen is soft.  Musculoskeletal:        General: Normal range of motion.     Cervical back: Normal range of motion and neck supple.  Skin:    General: Skin is warm and dry.  Neurological:     General: No focal deficit present.     Mental Status: He is alert and oriented to person, place, and time.     Comments: Mental Status:  Alert, oriented, thought content appropriate. Speech fluent without evidence of aphasia. Able to follow 2 step commands without difficulty.  Cranial Nerves:  III,IV, VI: ptosis not present, extra-ocular motions intact bilaterally  V,VII: smile symmetric, facial light touch sensation equal VIII: hearing grossly normal bilaterally  IX,X: midline uvula rise  XI: bilateral shoulder shrug equal and strong XII: midline tongue extension  Motor:  5/5 in upper and lower extremities bilaterally including strong and equal grip strength and dorsiflexion/plantar flexion Sensory:  touch normal in all extremities.  Cerebellar: normal finger-to-nose with bilateral upper extremities Gait:  normal gait and balance CV: distal pulses palpable throughout      ED Results / Procedures / Treatments   Labs (all labs ordered are listed,  but only abnormal results are displayed) Labs Reviewed  COMPREHENSIVE METABOLIC PANEL - Abnormal; Notable for the following components:      Result Value   Creatinine, Ser 1.67 (*)    GFR, Estimated 43 (*)    All other components within normal limits  RESP PANEL BY RT-PCR (FLU A&B, COVID) ARPGX2  CBC WITH DIFFERENTIAL/PLATELET  SEDIMENTATION RATE  C-REACTIVE PROTEIN  PROTIME-INR    EKG None  Radiology CT HEAD WO CONTRAST (5MM)  Result Date: 08/28/2020 CLINICAL DATA:  Monocular vision loss EXAM: CT HEAD WITHOUT CONTRAST TECHNIQUE: Contiguous axial images were obtained from the base of the skull through the vertex without intravenous contrast. COMPARISON:  Head CT 05/16/2004 FINDINGS: Brain: There is no evidence of acute intracranial hemorrhage, extra-axial fluid collection, or infarct. Vascular: There is calcification of the bilateral cavernous ICAs. Skull: Normal. Negative for fracture or focal lesion. Sinuses/Orbits: There is mucosal thickening throughout the ethmoid air cells. There is a right mastoid effusion. The patient is status post left cataract extraction. The globes and orbits are otherwise unremarkable. Other: None. IMPRESSION: 1. No acute intracranial pathology. 2. Status post left cataract extraction. Otherwise, unremarkable globes and orbits. Electronically Signed   By: Valetta Mole M.D.   On: 08/28/2020 14:40   OCT, Retina - OU - Both Eyes  Result Date: 08/28/2020 Right Eye Quality was good. Central Foveal Thickness: 287. Progression has no prior data. Findings include normal foveal contour, no IRF, no SRF, vitreomacular adhesion . Left Eye Quality was good. Central Foveal Thickness: 307. Progression has no prior data. Findings include normal foveal contour, no IRF, no SRF, intraretinal hyper-reflective material, vitreomacular adhesion (Diffuse inner retinal hyper reflectivity superior macula extending to fovea, partial PVD). Notes *Images captured and stored on drive Diagnosis  / Impression: OD: NFP, no IRF/SRF OS: BRAO affecting superior macula and fovea Clinical management: See below Abbreviations: NFP - Normal foveal profile. CME - cystoid macular edema. PED - pigment epithelial detachment. IRF - intraretinal fluid. SRF - subretinal fluid. EZ - ellipsoid zone. ERM - epiretinal membrane. ORA - outer retinal atrophy. ORT - outer retinal tubulation. SRHM - subretinal hyper-reflective material. IRHM - intraretinal hyper-reflective material   Fluorescein Angiography Optos (Transit OS)  Result Date: 08/28/2020 Right Eye Progression has no prior data. Early phase findings include normal observations. Mid/Late phase findings include normal observations. Left Eye Progression has no prior data. Early phase findings include delayed filling (Severely delayed filling and venous return). Mid/Late phase findings include normal observations. Notes **Images stored on drive** Impression: OD: normal study OS: superior BRAO -- Severely delayed filling and venous return    Procedures Procedures   Medications Ordered in ED Medications - No data to display  ED Course  I have reviewed the triage vital signs and the nursing notes.  Pertinent labs & imaging results that were available during my care of the patient were reviewed by me and considered in my medical decision making (see chart for details).  75 year old here for evaluation of painless vision loss.  Afebrile, nonseptic, not ill-appearing.  Began on Friday with total vision loss to his left eye.  Vision returned.  Reoccurred approximately 4:30 PM yesterday with inferior left eye vision loss which she describes as "dark gray."  He has no headache.  He has a nonfocal neuro exam to extremities, face.  Heart and lungs clear.  Abdomen soft, nontender.  Seen by Dr. Coralyn Pear with [facility] who is concerned for BRAO, requesting stroke work-up.  Work-up started from triage which I personally reviewed and interpreted:  CBC no leukocytosis,  hemoglobin stable CMP creatinine 1.67 similar to prior CT head without any significant abnormality  CONSULT with Dr. Leonel Ramsay with Neuro who recommends admit for stroke [facility]. Will follow along  CONSULT with Dr. Tamala Julian with [facility] who agrees to evaluate patient for admission  The patient appears reasonably stabilized for admission considering the current resources, flow, and capabilities available in the ED at this time, and I doubt any other [facility] requiring further screening and/or treatment in the ED prior to admission.  Patient seen eval by attending, Dr. 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

## 2020-08-29 ENCOUNTER — Encounter (HOSPITAL_COMMUNITY): Payer: Medicare PPO

## 2020-08-29 ENCOUNTER — Encounter (HOSPITAL_COMMUNITY): Payer: Self-pay | Admitting: Internal Medicine

## 2020-08-29 ENCOUNTER — Observation Stay (HOSPITAL_COMMUNITY): Payer: Medicare PPO

## 2020-08-29 DIAGNOSIS — H34232 Retinal artery branch occlusion, left eye: Secondary | ICD-10-CM | POA: Diagnosis not present

## 2020-08-29 DIAGNOSIS — I63233 Cerebral infarction due to unspecified occlusion or stenosis of bilateral carotid arteries: Secondary | ICD-10-CM | POA: Diagnosis not present

## 2020-08-29 DIAGNOSIS — I63213 Cerebral infarction due to unspecified occlusion or stenosis of bilateral vertebral arteries: Secondary | ICD-10-CM | POA: Diagnosis not present

## 2020-08-29 DIAGNOSIS — I639 Cerebral infarction, unspecified: Secondary | ICD-10-CM | POA: Diagnosis not present

## 2020-08-29 LAB — GLUCOSE, CAPILLARY
Glucose-Capillary: 112 mg/dL — ABNORMAL HIGH (ref 70–99)
Glucose-Capillary: 214 mg/dL — ABNORMAL HIGH (ref 70–99)

## 2020-08-29 LAB — CBG MONITORING, ED
Glucose-Capillary: 90 mg/dL (ref 70–99)
Glucose-Capillary: 137 mg/dL — ABNORMAL HIGH (ref 70–99)
Glucose-Capillary: 97 mg/dL (ref 70–99)

## 2020-08-29 MED ORDER — ASPIRIN 81 MG PO CHEW
81.0000 mg | CHEWABLE_TABLET | Freq: Every day | ORAL | Status: DC
  Administered 2020-08-30: 81 mg via ORAL
  Filled 2020-08-29: qty 1

## 2020-08-29 MED ORDER — GADOBUTROL 1 MMOL/ML IV SOLN
9.0000 mL | Freq: Once | INTRAVENOUS | Status: AC | PRN
  Administered 2020-08-29: 9 mL via INTRAVENOUS

## 2020-08-29 MED ORDER — HALOPERIDOL LACTATE 5 MG/ML IJ SOLN: 2.0000 mg | INTRAMUSCULAR | Status: AC

## 2020-08-29 MED ORDER — CLOPIDOGREL BISULFATE 75 MG PO TABS
75.0000 mg | ORAL_TABLET | Freq: Every day | ORAL | Status: DC
  Administered 2020-08-29 – 2020-08-30 (×2): 75 mg via ORAL
  Filled 2020-08-29 (×2): qty 1

## 2020-08-29 MED ORDER — DIAZEPAM 5 MG/ML IJ SOLN
5.0000 mg | Freq: Once | INTRAMUSCULAR | Status: AC
  Administered 2020-08-29: 5 mg via INTRAVENOUS
  Filled 2020-08-29: qty 2

## 2020-08-29 NOTE — ED Notes (Signed)
Pt AxO x4. GCS 15. Steady on feet, walking around in room. Denies pain. Introduced myself to pt. Family at bedside.

## 2020-08-29 NOTE — Progress Notes (Signed)
PROGRESS NOTE  Jacob Rios  [id number] DOB: 23-Mar-1945 DOA: 08/28/2020 PCP: [name], FNP   Brief Narrative: Jacob Rios is a 75 y.o. male with a history of HTN, HLD, CAD s/p CABG, T2DM, stage IIIa CKD, who was sent from his ophthalmologist's office where he complained of vision changes in the left eye that initially started 4 days prior, resolved, and returned the day before presentation. Due to concern for branch retinal artery occlusion, he was sent to the ED where he was afebrile, hypertensive with labs unremarkable save for SCr 1.67. CT head showed no acute infarcts. Neurology was consulted, recommended MRI and MRA which revealed acute-to-subacute small infarct in the left subcortical parietal lobe.   Assessment & Plan: Principal Problem:   Retinal artery occlusion, branch Active Problems:   Essential hypertension   Diabetes mellitus ([place])   Hyperlipemia   CKD stage 3 due to type 2 diabetes mellitus ([place])  Left branch superior branch retinal artery occlusion: Dx by ophthalmology, Dr. Coralyn Pear. Retina OCT: Diffuse inner retinal hyper reflectivity superior macula extending to fovea, partial PVD consistent with BRAO affecting superior macula and fovea. Fluorescein Angiography Optos testing revealed severely delayed filling and venous return in the OS consistent with superior BRAO. - ROS for GCA and inflammatory markers are both negative.  - No retinal or ophthalmic intervention indicated or recommended.  - Follow up with ophthalmology in 3-4 weeks.   54m acute or early subacute left parietal lobe subcortical infarct: MRA without LVO. - Neurology recommended DAPT x3 weeks then plavix alone (ASA monoTx PTA). Touched base this morning and again after MRI findings returned. They now recommend loop recorder implantation which can be done tomorrow morning.  - LDL 72. Home atorvastatin '40mg'$  continued.  - HbA1c 6.5%.  - Will need neurology follow up.   T2DM:  - At goal A1c,  continue home medications.   HTN:  - Permissive HTN, planning to return to usual goal over 48-72 hours.   CAD s/p CABG:   Stage IIIa CKD:  - Near baseline. Risk factor modification planned as above.   2 mm aneurysm arising from the cavernous right ICA. 4. 1 mm aneurysm versus infundibulum arising from the supraclinoid left ICA.   DVT prophylaxis: Lovenox Code Status: Full Family Communication: None at bedside Disposition Plan:  Status is: Observation, d/w UR  The patient remains OBS appropriate and will d/c before 2 midnights.  Dispo: The patient is from: Home              Anticipated d/c is to: Home              Patient currently is not medically stable to d/c.   Difficult to place patient No  Consultants:  Neurology EP  Procedures:  ILR implantation planned 08/30/2020.  Antimicrobials: None   Subjective: Vision loss (described as solid grey/blue washout in lower sections of vision when covering right eye) is stable from admission. No new deficits. Denies speech or mobility problems. No known hx AFib, no palpitations, chest pain or dyspnea.   Objective: Vitals:   08/29/20 1308 08/29/20 1615 08/29/20 1616 08/29/20 1734  BP: 133/71 134/66  120/82  Pulse: 75  66 76  Resp: '16  17 18  '$ Temp: 98.5 F (36.9 C)  97.8 F (36.6 C) 97.9 F (36.6 C)  TempSrc: Oral  Oral Oral  SpO2: 96%  96% 99%  Weight:    68.5 kg  Height:    '5\' 11"'$  (1.803 m)  No intake or output data in the 24 hours ending 08/29/20 1751 Filed Weights   08/29/20 1734  Weight: 68.5 kg    Gen: 75 y.o. male in no distress  Pulm: Non-labored breathing room air. Clear to auscultation bilaterally.  CV: Regular rate and rhythm. No murmur, rub, or gallop. No JVD, no pedal edema. GI: Abdomen soft, non-tender, non-distended, with normoactive bowel sounds. No organomegaly or masses felt. Ext: Warm, no deformities Skin: No rashes, lesions or ulcers Neuro: Alert and oriented. Fluent speech, no sensory or motor  impairments. Gait steady. Impaired vision in lower quadrants of left eye only.   Psych: Judgement and insight appear normal. Mood & affect appropriate.   Data Reviewed: I have personally reviewed following labs and imaging studies  CBC: Recent Labs  Lab 08/28/20 1345  WBC 8.0  NEUTROABS 5.8  HGB 13.2  HCT 41.0  MCV 87.2  PLT 0000000   Basic Metabolic Panel: Recent Labs  Lab 08/28/20 1345  NA 140  K 4.0  CL 103  CO2 27  GLUCOSE 82  BUN 18  CREATININE 1.67*  CALCIUM 9.5   GFR: Estimated Creatinine Clearance: 38.2 mL/min (A) (by C-G formula based on SCr of 1.67 mg/dL (H)). Liver Function Tests: Recent Labs  Lab 08/28/20 1345  AST 15  ALT 16  ALKPHOS 95  BILITOT 0.5  PROT 7.5  ALBUMIN 3.9   No results for input(s): LIPASE, AMYLASE in the last 168 hours. No results for input(s): AMMONIA in the last 168 hours. Coagulation Profile: Recent Labs  Lab 08/28/20 1517  INR 1.0   Cardiac Enzymes: No results for input(s): CKTOTAL, CKMB, CKMBINDEX, TROPONINI in the last 168 hours. BNP (last 3 results) No results for input(s): PROBNP in the last 8760 hours. HbA1C: Recent Labs    08/28/20 1607  HGBA1C 6.5*   CBG: Recent Labs  Lab 08/29/20 0250 08/29/20 0830 08/29/20 1237 08/29/20 1740  GLUCAP 90 137* 97 112*   Lipid Profile: Recent Labs    08/28/20 1607  CHOL 121  HDL 31*  LDLCALC 72  TRIG 89  CHOLHDL 3.9   Thyroid Function Tests: No results for input(s): TSH, T4TOTAL, FREET4, T3FREE, THYROIDAB in the last 72 hours. Anemia Panel: No results for input(s): VITAMINB12, FOLATE, FERRITIN, TIBC, IRON, RETICCTPCT in the last 72 hours. Urine analysis:    Component Value Date/Time   COLORURINE AMBER (A) 07/15/2018 1202   APPEARANCEUR Clear 07/05/2020 0954   LABSPEC 1.026 07/15/2018 1202   PHURINE 5.0 07/15/2018 1202   GLUCOSEU Negative 07/05/2020 0954   HGBUR NEGATIVE 07/15/2018 1202   BILIRUBINUR Negative 07/05/2020 0954   KETONESUR NEGATIVE 07/15/2018  1202   PROTEINUR 1+ (A) 07/05/2020 0954   PROTEINUR 100 (A) 07/15/2018 1202   UROBILINOGEN 0.2 04/08/2019 1118   NITRITE Negative 07/05/2020 0954   NITRITE NEGATIVE 07/15/2018 1202   LEUKOCYTESUR Negative 07/05/2020 0954   LEUKOCYTESUR LARGE (A) 07/15/2018 1202   Recent Results (from the past 240 hour(s))  Resp Panel by RT-PCR (Flu A&B, Covid) Nasopharyngeal Swab     Status: None   Collection Time: 08/28/20  3:18 PM   Specimen: Nasopharyngeal Swab; Nasopharyngeal(NP) swabs in vial transport medium  Result Value Ref Range Status   SARS Coronavirus 2 by RT PCR NEGATIVE NEGATIVE Final    Comment: (NOTE) SARS-CoV-2 target nucleic acids are NOT DETECTED.  The SARS-CoV-2 RNA is generally detectable in upper respiratory specimens during the acute phase of infection. The lowest concentration of SARS-CoV-2 viral copies this assay can detect  is 138 copies/mL. A negative result does not preclude SARS-Cov-2 infection and should not be used as the sole basis for treatment or other patient management decisions. A negative result may occur with  improper specimen collection/handling, submission of specimen other than nasopharyngeal swab, presence of viral mutation(s) within the areas targeted by this assay, and inadequate number of viral copies(<138 copies/mL). A negative result must be combined with clinical observations, patient history, and epidemiological information. The expected result is Negative.  Fact Sheet for Patients:  EntrepreneurPulse.com.au  Fact Sheet for Healthcare Providers:  IncredibleEmployment.be  This test is no t yet approved or cleared by the [place] FDA and  has been authorized for detection and/or diagnosis of SARS-CoV-2 by FDA under an Emergency Use Authorization (EUA). This EUA will remain  in effect (meaning this test can be used) for the duration of the COVID-19 declaration under Section 564(b)(1) of the Act,  21 U.S.C.section 360bbb-3(b)(1), unless the authorization is terminated  or revoked sooner.       Influenza A by PCR NEGATIVE NEGATIVE Final   Influenza B by PCR NEGATIVE NEGATIVE Final    Comment: (NOTE) The Xpert Xpress SARS-CoV-2/FLU/RSV plus assay is intended as an aid in the diagnosis of influenza from Nasopharyngeal swab specimens and should not be used as a sole basis for treatment. Nasal washings and aspirates are unacceptable for Xpert Xpress SARS-CoV-2/FLU/RSV testing.  Fact Sheet for Patients: EntrepreneurPulse.com.au  Fact Sheet for Healthcare Providers: IncredibleEmployment.be  This test is not yet approved or cleared by the [place] FDA and has been authorized for detection and/or diagnosis of SARS-CoV-2 by FDA under an Emergency Use Authorization (EUA). This EUA will remain in effect (meaning this test can be used) for the duration of the COVID-19 declaration under Section 564(b)(1) of the Act, 21 U.S.C. section 360bbb-3(b)(1), unless the authorization is terminated or revoked.  Performed at [facility], [place] [address] [place], [place] [address]       Radiology Studies: CT HEAD WO CONTRAST (5MM)  Result Date: 08/28/2020 CLINICAL DATA:  Monocular vision loss EXAM: CT HEAD WITHOUT CONTRAST TECHNIQUE: Contiguous axial images were obtained from the base of the skull through the vertex without intravenous contrast. COMPARISON:  Head CT 05/16/2004 FINDINGS: Brain: There is no evidence of acute intracranial hemorrhage, extra-axial fluid collection, or infarct. Vascular: There is calcification of the bilateral cavernous ICAs. Skull: Normal. Negative for fracture or focal lesion. Sinuses/Orbits: There is mucosal thickening throughout the ethmoid air cells. There is a right mastoid effusion. The patient is status post left cataract extraction. The globes and orbits are otherwise unremarkable. Other: None. IMPRESSION: 1. No  acute intracranial pathology. 2. Status post left cataract extraction. Otherwise, unremarkable globes and orbits. Electronically Signed   By: Valetta Mole M.D.   On: 08/28/2020 14:40   MR ANGIO HEAD WO CONTRAST  Result Date: 08/29/2020 CLINICAL DATA:  Stroke suspected (Ped 0-18y). Additional history obtained from [place] eye vision loss. EXAM: MRI HEAD WITHOUT AND WITH CONTRAST MRA HEAD WITHOUT CONTRAST TECHNIQUE: Multiplanar, multi-echo pulse sequences of the brain and surrounding structures were acquired without and with intravenous contrast. Angiographic images of the Circle of Willis were acquired using MRA technique without intravenous contrast. CONTRAST:  22m GADAVIST GADOBUTROL 1 MMOL/ML IV SOLN COMPARISON:  Noncontrast head CT performed earlier today 08/28/2020. FINDINGS: MRI HEAD FINDINGS Brain: Mild generalized cerebral and cerebellar atrophy. 2 mm focus of diffusion-weighted hyperintense signal abnormality within the left parietal lobe subcortical white matter compatible  with acute or early subacute infarct (series 5, image 89) (series 7, image 2). A few small scattered foci of T2/FLAIR hyperintensity within the cerebral white matter are nonspecific, but compatible with chronic small vessel ischemic disease. No evidence of an intracranial mass. No chronic intracranial blood products. No extra-axial fluid collection. No midline shift. No pathologic intracranial enhancement. Vascular: Maintained flow voids within the proximal large arterial vessels. Skull and upper cervical spine: No focal suspicious marrow lesion. Sinuses/Orbits: Visualized orbits show no acute finding. Left lens replacement. Mild mucosal thickening within the bilateral ethmoid air cells. Other: Small bilateral mastoid effusions. MRA HEAD FINDINGS Anterior circulation: The intracranial internal carotid arteries are patent. Mild atherosclerotic irregularity of both vessels. The M1 middle cerebral arteries are  patent. No M2 proximal branch occlusion or high-grade proximal stenosis is identified. The anterior cerebral arteries are patent. Partially azygos configuration of the anterior cerebral arteries at the A2 level. 1 mm inferiorly projecting vascular protrusion arising from the supraclinoid left ICA, which may reflect an aneurysm or infundibulum (series 5, image 103). 2 mm posteriorly projecting vascular protrusion arising from the proximal cavernous right ICA, likely reflecting an aneurysm (series 1048, image 247). Posterior circulation: The intracranial vertebral arteries are patent. The basilar artery is patent. The posterior cerebral arteries are patent. The posterior cerebral arteries are patent. Posterior communicating arteries are hypoplastic or absent bilaterally. Anatomic variants: As described IMPRESSION: MRI brain: 1. 2 mm acute or early subacute infarct within the left parietal lobe subcortical white matter. 2. Minimal chronic small vessel ischemic changes within the cerebral white matter. 3. Mild generalized cerebral and cerebellar atrophy. 4. Mild bilateral ethmoid sinus mucosal thickening. 5. Small bilateral mastoid effusions. MRA head: 1. No intracranial large vessel occlusion or proximal high-grade arterial stenosis. 2. Mild atherosclerotic irregularity of the intracranial internal carotid arteries. 3. 2 mm aneurysm arising from the cavernous right ICA. 4. 1 mm aneurysm versus infundibulum arising from the supraclinoid left ICA. Electronically Signed   By: Kellie Simmering D.O.   On: 08/29/2020 12:30   MR ANGIO NECK W WO CONTRAST  Result Date: 08/29/2020 CLINICAL DATA:  Stroke, follow-up. EXAM: MRA NECK WITHOUT AND WITH CONTRAST TECHNIQUE: Multiplanar and multiecho pulse sequences of the neck were obtained without and with intravenous contrast. Angiographic images of the neck were obtained using MRA technique without and with intravenous contrast. CONTRAST:  2m GADAVIST GADOBUTROL 1 MMOL/ML IV SOLN  COMPARISON:  Same day MRA of the head 08/29/2020. FINDINGS: Mildly motion degraded examination. Common origin of the innominate and left common carotid arteries. The visualized aortic arch is normal in caliber. No hemodynamically significant innominate or proximal subclavian artery stenosis. Saccular aneurysm arising from the proximal left subclavian artery measuring 9 x 9 mm (series 1003, image 2). The right CCA and ICA are patent within the neck without hemodynamically significant stenosis (50% or greater). Mild atherosclerotic irregularity about the carotid bifurcation. The left CCA and ICA are patent within the neck without hemodynamically significant stenosis (50% or greater). Mild atherosclerotic irregularity about the carotid bifurcation. Vertebral arteries codominant and patent within the neck. Mild atherosclerotic narrowing at the origin of the right vertebral artery. There is at least moderate atherosclerotic narrowing at the origin of the left vertebral artery. IMPRESSION: The common and internal carotid arteries are patent within the neck without hemodynamically significant stenosis (50% or greater). Mild atherosclerotic irregularity about both carotid bifurcations. The vertebral arteries are codominant and patent within the neck. At least moderate atherosclerotic narrowing at the origin of the  left vertebral artery. Mild atherosclerotic narrowing at the origin of the right vertebral artery. 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 Electronically Signed   By: Kellie Simmering D.O.   On: 08/29/2020 12:41   MR BRAIN W WO CONTRAST  Result Date: 08/29/2020 CLINICAL DATA:  Stroke suspected (Ped 0-18y). Additional history obtained from [place] eye vision loss. EXAM: MRI HEAD WITHOUT AND WITH CONTRAST MRA HEAD WITHOUT CONTRAST TECHNIQUE: Multiplanar, multi-echo pulse sequences of the brain and surrounding structures were acquired without and with  intravenous contrast. Angiographic images of the Circle of Willis were acquired using MRA technique without intravenous contrast. CONTRAST:  64m GADAVIST GADOBUTROL 1 MMOL/ML IV SOLN COMPARISON:  Noncontrast head CT performed earlier today 08/28/2020. FINDINGS: MRI HEAD FINDINGS Brain: Mild generalized cerebral and cerebellar atrophy. 2 mm focus of diffusion-weighted hyperintense signal abnormality within the left parietal lobe subcortical white matter compatible with acute or early subacute infarct (series 5, image 89) (series 7, image 46). A few small scattered foci of T2/FLAIR hyperintensity within the cerebral white matter are nonspecific, but compatible with chronic small vessel ischemic disease. No evidence of an intracranial mass. No chronic intracranial blood products. No extra-axial fluid collection. No midline shift. No pathologic intracranial enhancement. Vascular: Maintained flow voids within the proximal large arterial vessels. Skull and upper cervical spine: No focal suspicious marrow lesion. Sinuses/Orbits: Visualized orbits show no acute finding. Left lens replacement. Mild mucosal thickening within the bilateral ethmoid air cells. Other: Small bilateral mastoid effusions. MRA HEAD FINDINGS Anterior circulation: The intracranial internal carotid arteries are patent. Mild atherosclerotic irregularity of both vessels. The M1 middle cerebral arteries are patent. No M2 proximal branch occlusion or high-grade proximal stenosis is identified. The anterior cerebral arteries are patent. Partially azygos configuration of the anterior cerebral arteries at the A2 level. 1 mm inferiorly projecting vascular protrusion arising from the supraclinoid left ICA, which may reflect an aneurysm or infundibulum (series 5, image 103). 2 mm posteriorly projecting vascular protrusion arising from the proximal cavernous right ICA, likely reflecting an aneurysm (series 1048, image 247). Posterior circulation: The intracranial  vertebral arteries are patent. The basilar artery is patent. The posterior cerebral arteries are patent. The posterior cerebral arteries are patent. Posterior communicating arteries are hypoplastic or absent bilaterally. Anatomic variants: As described IMPRESSION: MRI brain: 1. 2 mm acute or early subacute infarct within the left parietal lobe subcortical white matter. 2. Minimal chronic small vessel ischemic changes within the cerebral white matter. 3. Mild generalized cerebral and cerebellar atrophy. 4. Mild bilateral ethmoid sinus mucosal thickening. 5. Small bilateral mastoid effusions. MRA head: 1. No intracranial large vessel occlusion or proximal high-grade arterial stenosis. 2. Mild atherosclerotic irregularity of the intracranial internal carotid arteries. 3. 2 mm aneurysm arising from the cavernous right ICA. 4. 1 mm aneurysm versus infundibulum arising from the supraclinoid left ICA. Electronically Signed   By: KKellie SimmeringD.O.   On: 08/29/2020 12:30   ECHOCARDIOGRAM COMPLETE  Result Date: 08/28/2020    ECHOCARDIOGRAM REPORT   Patient Name:   RCADYN SHADOWENSDate of Exam: 08/28/2020 Medical Rec #:  [id number]     Height:       72.0 in Accession #:    [id number]    Weight:       230.0 lb Date of Birth:  [birth year]     BSA:          2.261 m Patient Age:  74 years       BP:           151/90 mmHg Patient Gender: M              HR:           94 bpm. Exam Location:  Inpatient Procedure: 2D Echo, Cardiac Doppler and Color Doppler Indications:    Stroke  History:        Patient has prior history of Echocardiogram examinations. CAD,                 Prior CABG; Risk Factors:Hypertension and Diabetes.  Sonographer:    Merrie Roof RDCS Referring Phys: [id number] [place]  1. Left ventricular ejection fraction, by estimation, is 50 to 55%. The left ventricle has low normal function. The left ventricle has no regional wall motion abnormalities. There is moderate left ventricular hypertrophy. Left  ventricular diastolic parameters were normal.  2. Right ventricular systolic function is normal. The right ventricular size is normal.  3. Left atrial size was mildly dilated.  4. The mitral valve is normal in structure. Trivial mitral valve regurgitation. No evidence of mitral stenosis.  5. Very mild AS with AVA 1.87 and mean gradient only 6 peak 11 mmhg no obvious sub aortic web . The aortic valve is tricuspid. There is moderate calcification of the aortic valve. There is moderate thickening of the aortic valve. Aortic valve regurgitation is not visualized. Mild aortic valve stenosis.  6. Aortic dilatation noted. There is mild dilatation of the aortic root, measuring 39 mm.  7. The inferior vena cava is normal in size with greater than 50% respiratory variability, suggesting right atrial pressure of 3 mmHg. FINDINGS  Left Ventricle: Left ventricular ejection fraction, by estimation, is 50 to 55%. The left ventricle has low normal function. The left ventricle has no regional wall motion abnormalities. The left ventricular internal cavity size was normal in size. There is moderate left ventricular hypertrophy. Left ventricular diastolic parameters were normal. Right Ventricle: The right ventricular size is normal. No increase in right ventricular wall thickness. Right ventricular systolic function is normal. Left Atrium: Left atrial size was mildly dilated. Right Atrium: Right atrial size was normal in size. Pericardium: There is no evidence of pericardial effusion. Mitral Valve: The mitral valve is normal in structure. Trivial mitral valve regurgitation. No evidence of mitral valve stenosis. Tricuspid Valve: The tricuspid valve is normal in structure. Tricuspid valve regurgitation is not demonstrated. No evidence of tricuspid stenosis. Aortic Valve: Very mild AS with AVA 1.87 and mean gradient only 6 peak 11 mmhg no obvious sub aortic web. The aortic valve is tricuspid. There is moderate calcification of the aortic  valve. There is moderate thickening of the aortic valve. Aortic valve regurgitation is not visualized. Mild aortic stenosis is present. Aortic valve mean gradient measures 6.0 mmHg. Aortic valve peak gradient measures 11.3 mmHg. Aortic valve area, by VTI measures 1.94 cm. Pulmonic Valve: The pulmonic valve was normal in structure. Pulmonic valve regurgitation is not visualized. No evidence of pulmonic stenosis. Aorta: The aortic root is normal in size and structure and aortic dilatation noted. There is mild dilatation of the aortic root, measuring 39 mm. Venous: The inferior vena cava is normal in size with greater than 50% respiratory variability, suggesting right atrial pressure of 3 mmHg. IAS/Shunts: No atrial level shunt detected by color flow Doppler.  LEFT VENTRICLE PLAX 2D LVIDd:         4.70 cm  Diastology LVIDs:         3.50 cm      LV e' medial:    4.68 cm/s LV PW:         1.30 cm      LV E/e' medial:  15.0 LV IVS:        1.50 cm      LV e' lateral:   11.60 cm/s LVOT diam:     2.30 cm      LV E/e' lateral: 6.1 LV SV:         81 LV SV Index:   36 LVOT Area:     4.15 cm  LV Volumes (MOD) LV vol d, MOD A4C: 178.0 ml LV vol s, MOD A4C: 67.9 ml LV SV MOD A4C:     178.0 ml RIGHT VENTRICLE RV Basal diam:  3.10 cm LEFT ATRIUM             Index       RIGHT ATRIUM           Index LA diam:        4.10 cm 1.81 cm/m  RA Area:     14.30 cm LA Vol (A2C):   66.2 ml 29.28 ml/m RA Volume:   31.60 ml  13.98 ml/m LA Vol (A4C):   55.5 ml 24.55 ml/m LA Biplane Vol: 62.0 ml 27.42 ml/m  AORTIC VALVE AV Area (Vmax):    1.87 cm AV Area (Vmean):   1.94 cm AV Area (VTI):     1.94 cm AV Vmax:           168.00 cm/s AV Vmean:          112.000 cm/s AV VTI:            0.415 m AV Peak Grad:      11.3 mmHg AV Mean Grad:      6.0 mmHg LVOT Vmax:         75.80 cm/s LVOT Vmean:        52.300 cm/s LVOT VTI:          0.194 m LVOT/AV VTI ratio: 0.47  AORTA Ao Root diam: 4.05 cm Ao Asc diam:  3.30 cm MITRAL VALVE MV Area (PHT): 3.02  cm    SHUNTS MV Decel Time: 251 msec    Systemic VTI:  0.19 m MV E velocity: 70.30 cm/s  Systemic Diam: 2.30 cm MV A velocity: 99.00 cm/s MV E/A ratio:  0.71 Jenkins Rouge MD Electronically signed by Jenkins Rouge MD Signature Date/Time: 08/28/2020/5:25:34 PM    Final    OCT, Retina - OU - Both Eyes  Result Date: 08/28/2020 Right Eye Quality was good. Central Foveal Thickness: 287. Progression has no prior data. Findings include normal foveal contour, no IRF, no SRF, vitreomacular adhesion . Left Eye Quality was good. Central Foveal Thickness: 307. Progression has no prior data. Findings include normal foveal contour, no IRF, no SRF, intraretinal hyper-reflective material, vitreomacular adhesion (Diffuse inner retinal hyper reflectivity superior macula extending to fovea, partial PVD). Notes *Images captured and stored on drive Diagnosis / Impression: OD: NFP, no IRF/SRF OS: BRAO affecting superior macula and fovea Clinical management: See below Abbreviations: NFP - Normal foveal profile. CME - cystoid macular edema. PED - pigment epithelial detachment. IRF - intraretinal fluid. SRF - subretinal fluid. EZ - ellipsoid zone. ERM - epiretinal membrane. ORA - outer retinal atrophy. ORT - outer retinal tubulation. SRHM - subretinal hyper-reflective material. IRHM - intraretinal hyper-reflective material  Fluorescein Angiography Optos (Transit OS)  Result Date: 08/28/2020 Right Eye Progression has no prior data. Early phase findings include normal observations. Mid/Late phase findings include normal observations. Left Eye Progression has no prior data. Early phase findings include delayed filling (Severely delayed filling and venous return). Mid/Late phase findings include normal observations. Notes **Images stored on drive** Impression: OD: normal study OS: superior BRAO -- Severely delayed filling and venous return    Scheduled Meds:   stroke: mapping our early stages of recovery book   Does not apply Once    [START ON 08/30/2020] aspirin  81 mg Oral Daily   atorvastatin  40 mg Oral Daily   clopidogrel  75 mg Oral Daily   enoxaparin (LOVENOX) injection  40 mg Subcutaneous Q24H   haloperidol lactate  2 mg Intravenous On Call   insulin aspart  0-9 Units Subcutaneous TID WC   metoprolol succinate  50 mg Oral Daily   pantoprazole  40 mg Oral Daily   Continuous Infusions:   LOS: 0 days   Time spent: 25 minutes.  Patrecia Pour, MD [facility] [email or web address] 08/29/2020, 5:51 PM

## 2020-08-29 NOTE — Evaluation (Signed)
Patient admitted with the above diagnosis anaOccupational Therapy Evaluation Patient Details Name: Jacob Rios MRN: [id number] DOB: July 16, 1945 Today's Date: 08/29/2020    History of Present Illness 75 y.o. male with medical history significant of HTN, HLD, CAD s/p CABG, diabetes mellitus type 2 presents with complaints of vision loss out of his left eye.  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  1 mm aneurysm versus infundibulum arising from the supraclinoid left ICA, and 2 mm aneurysm arising from the cavernous right ICA.   Clinical Impression   Patient admitted with the above diagnosis and subsequent MRI findings.  PTA he lives with his spouse, and remains very active, and needs no assist with any aspect of ADL or mobility.  Primary deficit is a "blue haze" as described by the patient that is occluding the inferior half of his left visual field.  He is compensating for it well, is not having any mobility deficits, and peripheral vision is intact to the left.  OT educated him on scanning his environment for trip hazards, and he verbalizes understanding.  No acute needs were identified.      Follow Up Recommendations  Other (comment) (continued follow up with opthamalogist as needed/recommended)    Equipment Recommendations  None recommended by OT    Recommendations for Other Services       Precautions / Restrictions Precautions Precautions: Fall Restrictions Weight Bearing Restrictions: No      Mobility Bed Mobility Overal bed mobility: Independent               Patient Response: Cooperative  Transfers Overall transfer level: Independent                    Balance Overall balance assessment: No apparent balance deficits (not formally assessed)                                         ADL either performed or assessed with clinical  judgement   ADL Overall ADL's : At baseline                                             Vision Patient Visual Report: Blurring of vision Vision Assessment?: Yes Ocular Range of Motion: Within Functional Limits Alignment/Gaze Preference: Within Defined Limits Tracking/Visual Pursuits: Other (comment) (L inferior quad with a "blue haze" that impacts visual field.) Saccades: Within functional limits Convergence: Within functional limits Depth Perception:  [facility])     Perception     Praxis      Pertinent Vitals/Pain Pain Assessment: No/denies pain     Hand Dominance Right   Extremity/Trunk Assessment Upper Extremity Assessment Upper Extremity Assessment: Overall WFL for tasks assessed   Lower Extremity Assessment Lower Extremity Assessment: Defer to PT evaluation   Cervical / Trunk Assessment Cervical / Trunk Assessment: Normal   Communication Communication Communication: No difficulties   Cognition Arousal/Alertness: Awake/alert Behavior During Therapy: WFL for tasks assessed/performed Overall Cognitive Status: Within Functional Limits for tasks assessed  General Comments       Exercises     Shoulder Instructions      Home Living Family/patient expects to be discharged to:: Private residence Living Arrangements: Spouse/significant other Available Help at Discharge: Family;Available 24 hours/day Type of Home: House Home Access: Stairs to enter [employer] of Steps: 8 from the back deck. Entrance Stairs-Rails: Right Home Layout: One level     Bathroom Shower/Tub: Teacher, early years/pre: Standard     Home Equipment: None          Prior Functioning/Environment Level of Independence: Independent                 OT Problem List: Impaired vision/perception      OT Treatment/Interventions:      OT Goals(Current goals can be found in the care plan  section) Acute Rehab OT Goals Patient Stated Goal: Return home soon OT Goal Formulation: With patient Time For Goal Achievement: 08/29/20 Potential to Achieve Goals: Good  OT Frequency:     Barriers to D/C:  None noted          Co-evaluation              AM-PAC OT "6 Clicks" Daily Activity     Outcome Measure Help from another person eating meals?: None Help from another person taking care of personal grooming?: None Help from another person toileting, which includes using toliet, bedpan, or urinal?: None Help from another person bathing (including washing, rinsing, drying)?: None Help from another person to put on and taking off regular upper body clothing?: None Help from another person to put on and taking off regular lower body clothing?: None 6 Click Score: 24   End of Session    Activity Tolerance: Patient tolerated treatment well Patient left: in bed;with call bell/phone within reach;with family/visitor present  OT Visit Diagnosis: Other (comment) (visual impairment)                Time: [id number] OT Time Calculation (min): 16 min Charges:  OT General Charges $OT Visit: 1 Visit OT Evaluation $OT Eval Moderate Complexity: 1 Mod  08/29/2020  Rich, OTR/L  Acute Rehabilitation Services  Office:  [phone]   Metta Clines 08/29/2020, 3:00 PM

## 2020-08-29 NOTE — Plan of Care (Signed)

## 2020-08-29 NOTE — ED Notes (Signed)
Pt transported to floor via transport via stretcher.

## 2020-08-29 NOTE — Progress Notes (Addendum)
STROKE TEAM PROGRESS NOTE    Interval History   No acute events overnight, patient is resting in bed with his wife at bedside.   He continues to have vision loss in the left eye on the bottom half of his vision.    Pertinent Lab Work and Imaging    08/29/20 CT Head WO IV Contrast 1. No acute intracranial pathology. 2. Status post left cataract extraction. Otherwise, unremarkable globes and orbits.  08/29/20 MRA Head + MRI Brain  MRI brain: 1. 2 mm acute or early subacute infarct within the left parietal lobe subcortical white matter. 2. Minimal chronic small vessel ischemic changes within the cerebral white matter. 3. Mild generalized cerebral and cerebellar atrophy. 4. Mild bilateral ethmoid sinus mucosal thickening. 5. Small bilateral mastoid effusions.   MRA head: 1. No intracranial large vessel occlusion or proximal high-grade arterial stenosis. 2. Mild atherosclerotic irregularity of the intracranial internal carotid arteries. 3. 2 mm aneurysm arising from the cavernous right ICA. 4. 1 mm aneurysm versus infundibulum arising from the supraclinoid left ICA.  08/29/20 MRA Neck  The common and internal carotid arteries are patent within the neck without hemodynamically significant stenosis (50% or greater). Mild atherosclerotic irregularity about both carotid bifurcations.   The vertebral arteries are codominant and patent within the neck. At least moderate atherosclerotic narrowing at the origin of the left vertebral artery. Mild atherosclerotic narrowing at the origin of the right vertebral artery.   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  08/28/20 Echocardiogram Complete   1. Left ventricular ejection fraction, by estimation, is 50 to 55%. The left ventricle has low normal function. The left ventricle has no regional wall motion abnormalities. There is moderate left ventricular hypertrophy. Left ventricular diastolic parameters were  normal.   2. Right ventricular systolic function is normal. The right ventricular size is normal.   3. Left atrial size was mildly dilated.   4. The mitral valve is normal in structure. Trivial mitral valve regurgitation. No evidence of mitral stenosis.   5. Very mild AS with AVA 1.87 and mean gradient only 6 peak 11 mmhg no obvious sub aortic web . The aortic valve is tricuspid. There is moderate calcification of the aortic valve. There is moderate thickening of the aortic valve. Aortic valve regurgitation is not visualized. Mild aortic valve stenosis.   6. Aortic dilatation noted. There is mild dilatation of the aortic root, measuring 39 mm.   7. The inferior vena cava is normal in size with greater than 50% respiratory variability, suggesting right atrial pressure of 3 mmHg.   Physical Examination   Constitutional: Calm, appropriate for condition  Cardiovascular: Normal RR Respiratory: No increased WOB   Mental status: AAOx4, following commands  Speech: Fluent, repetition and naming intact  Cranial nerves: EOMI, Loss of vision to the bottom visual fields to his left eye, Face symmetric, Tongue midline  Motor: Normal bulk and tone. No drift. Strength 5/5 throughout  Sensory: Intact to light touch throughout  Coordination: FNF intact Gait: Deferred  Assessment and Plan   [name] is a 75 y.o. male w/pmh of HTN, CKD, CAD s/p MI and CABG, PAD and DM II, hypertensive retinopathy, and cataracts who presented from his Ophthalmology office with BRAO. He was outside of the time window for IVTPA and thrombectomy.   #L BRAO  #L Parietal Stroke  Patient presented with the symptoms described above. At this time, his work up is complete. His MRI  Brain was pertinent for a Left Parietal stroke. MRA Head and Neck did not show significant stenosis to the left carotid system. Echo w/EF 50 to 55 %, LA moderately dilated. Stroke labs were completed including Lipid panel w/LDL 72 and Hemoglobin  A1C 6.5. Etiology of his left parietal stroke and L BRAO are cryptogenic. Will need cardiac monitoring to rule out cardioembolic etiology given he has no significant stenosis.  - DAPT for 21 days followed by Plavix monotherapy given he was taking ASA daily at home when stroke occurred  - ILR to evaluate for atrial fibrillation ( please consult EP for this)  - Continue Atorvastatin 40 mg for stroke prevention  - At discharge please place ambulatory referral to neurology for stroke follow up   #Hypertension He has a history of HTN. Recommend permissive hypertension 48 hours post stroke and from there, gradually reduce the blood pressure, avoiding any acute drops. Long term blood pressure goal is < 140/90.  #Hyperlipidemia From a stroke prevention stand point, the LDL goal is < 70. LDL is close to goal at 72, thus home statin was continued.   #Stroke Dysphagia Screening Passed bedside swallow evaluation for regular diet  #DMII  Hemoglobin A1C this admission noted to be 6.5, at goal from a stroke standpoint.   Hospital day # 0  Ruta Hinds, NP  [facility] Nurse Practitioner Patient seen and discussed with attending physician Dr. Carilyn Goodpasture MD NOTE I have personally obtained history,examined this patient, reviewed notes, independently viewed imaging studies, participated in medical decision making and plan of care.ROS completed by me personally and pertinent positives fully documented  I have made any additions or clarifications directly to the above note. Agree with note above.  Patient presented with altitudinal vision loss in the left eye in the lower field likely due to retinal artery branch occlusion of embolic etiology.  Strong suspicion for atrial fibrillation.  MRI scan of the brain shows an embolic left parietal infarct just clinically silent.  Recommend continue ongoing stroke work-up and patient likely needs cardiac monitoring for atrial fibrillation.  Loop recorder  as an outpatient by cardiology.  Dual antiplatelet therapy aspirin and Plavix for 3 weeks followed by Plavix alone and aggressive risk factor modification.  Greater than 50% time during this 35-minute visit was spent in counseling and coordination of care and discussion with care team about stroke prevention and treatment.  Discussed with Dr. Darleene Cleaver, MD Medical Director [place] Pager: [phone] 08/29/2020 6:15 PM   To contact Stroke Continuity provider, please refer to [email or web address] After hours, contact General Neurology

## 2020-08-29 NOTE — ED Notes (Signed)
Pt going upstairs.

## 2020-08-29 NOTE — Progress Notes (Signed)
New Admission Note:  Arrival Method: via stretcher from [facility] ED Mental Orientation:alert and oriented Telemetry: yes on box 31 Assessment: Completed Skin: clean dry and intact IV: 20 G Left FA SL Pain: No complaints of pain Safety Measures: Safety Fall Prevention Plan was given, discussed. Admission: Completed 3W: Patient has been orientated to the room, unit and the staff. Family: No family at bedside.  Orders have been reviewed and implemented. Will continue to monitor the patient. Call light has been placed within reach and bed alarm has been activated.   Jones Broom

## 2020-08-29 NOTE — ED Notes (Signed)
Pt to MRI

## 2020-08-29 NOTE — Progress Notes (Signed)
PT Cancellation Note  Patient Details Name: SUFIAN JACHIM MRN: [id number] DOB: 1945-08-05   Cancelled Treatment:    Reason Eval/Treat Not Completed: Other (comment);PT screened, no needs identified, will sign off.  Pt is up walking unassisted mult times this AM to bathroom, nursing agrees with pt that PT is not needed.  Asked nursing and pt to let PT know if this changes.   Ramond Dial 08/29/2020, 10:46 AM  Mee Hives, PT MS Acute Rehab Dept. Number: [place]

## 2020-08-30 ENCOUNTER — Encounter (HOSPITAL_COMMUNITY)
Admission: EM | Disposition: A | Discharge status: Home / Self Care | Payer: Self-pay | Source: Home / Self Care | Attending: Family Medicine

## 2020-08-30 DIAGNOSIS — N183 Chronic kidney disease, stage 3 unspecified: Secondary | ICD-10-CM | POA: Diagnosis not present

## 2020-08-30 DIAGNOSIS — E1122 Type 2 diabetes mellitus with diabetic chronic kidney disease: Secondary | ICD-10-CM

## 2020-08-30 DIAGNOSIS — I6389 Other cerebral infarction: Secondary | ICD-10-CM

## 2020-08-30 DIAGNOSIS — H34232 Retinal artery branch occlusion, left eye: Secondary | ICD-10-CM | POA: Diagnosis not present

## 2020-08-30 HISTORY — PX: LOOP RECORDER INSERTION: EP1214

## 2020-08-30 LAB — GLUCOSE, CAPILLARY
Glucose-Capillary: 112 mg/dL — ABNORMAL HIGH (ref 70–99)
Glucose-Capillary: 162 mg/dL — ABNORMAL HIGH (ref 70–99)
Glucose-Capillary: 107 mg/dL — ABNORMAL HIGH (ref 70–99)

## 2020-08-30 SURGERY — LOOP RECORDER INSERTION

## 2020-08-30 MED ORDER — ASPIRIN 81 MG PO TABS: 81.0000 mg | ORAL_TABLET | Freq: Every day | ORAL | 0 refills | Status: AC

## 2020-08-30 MED ORDER — CLOPIDOGREL BISULFATE 75 MG PO TABS: 75.0000 mg | ORAL_TABLET | Freq: Every day | ORAL | 1 refills | Status: DC

## 2020-08-30 MED ORDER — LIDOCAINE-EPINEPHRINE 1 %-1:100000 IJ SOLN
INTRAMUSCULAR | Status: AC
  Filled 2020-08-30: qty 1

## 2020-08-30 MED ORDER — LIDOCAINE HCL (PF) 1 % IJ SOLN
INTRAMUSCULAR | Status: DC | PRN
  Administered 2020-08-30: 30 mL

## 2020-08-30 SURGICAL SUPPLY — 2 items
MONITOR MOBILE MNGR LINQ22 (Prosthesis & Implant Heart) ×2 IMPLANT
PACK LOOP INSERTION (CUSTOM PROCEDURE TRAY) ×2 IMPLANT

## 2020-08-30 NOTE — Progress Notes (Signed)
Pt medically stable for discharge per MD order. Tele and IV removed. Belongings: cell phone, charger, glasses, clothing, shoes. Discharge paperwork reviewed with patient and wife. All questions answered. Pt leaving for home.

## 2020-08-30 NOTE — Consult Note (Signed)
ELECTROPHYSIOLOGY CONSULT NOTE  Patient ID: Jacob Rios MRN: [id number], DOB/AGE: [id number]   Admit date: 08/28/2020 Date of Consult: 08/30/2020  Primary Physician: [name], [place] Primary Cardiologist: Dr. Angelena Form Reason for Consultation: Cryptogenic stroke ; recommendations regarding Implantable Loop Recorder, requested by Dr. Leonie Man  History of Present Illness Jacob Rios was admitted on 08/28/2020 with 4 days of L eye vision changes/loss, saw his eye doctor and dx with root prescription occlusion, deferred to the ER, found with retinal artery occlusion/parietal stroke  PMHx includes: CABG (200), HTN, HLD, DM, PAD (Dr. Fletcher Anon), prostate Ca  Neurology notes: #L BRAO ,#L Parietal Stroke .  he has undergone workup for stroke including echocardiogram and carotid angio.  The patient has been monitored on telemetry which has demonstrated sinus rhythm with no arrhythmias.    Neurology has deferred TEE Echocardiogram this admission demonstrated    IMPRESSIONS   1. Left ventricular ejection fraction, by estimation, is 50 to 55%. The  left ventricle has low normal function. The left ventricle has no regional  wall motion abnormalities. There is moderate left ventricular hypertrophy.  Left ventricular diastolic  parameters were normal.   2. Right ventricular systolic function is normal. The right ventricular  size is normal.   3. Left atrial size was mildly dilated.   4. The mitral valve is normal in structure. Trivial mitral valve  regurgitation. No evidence of mitral stenosis.   5. Very mild AS with AVA 1.87 and mean gradient only 6 peak 11 mmhg no  obvious sub aortic web . The aortic valve is tricuspid. There is moderate  calcification of the aortic valve. There is moderate thickening of the  aortic valve. Aortic valve  regurgitation is not visualized. Mild aortic valve stenosis.   6. Aortic dilatation noted. There is mild dilatation of the aortic root,  measuring 39  mm.   7. The inferior vena cava is normal in size with greater than 50%  respiratory variability, suggesting right atrial pressure of 3 mmHg.    Lab work is reviewed.   Prior to admission, the patient denies chest pain, shortness of breath, dizziness, palpitations, or syncope.  They are recovering from their stroke with plans to home at discharge planned for today.      Past Medical History:  Diagnosis Date   CKD (chronic kidney disease), stage III ([place])    followed by pcp   Coronary artery disease cardiologist--- dr Angelena Form   08/ 1999  s/p  cath w/ PTCA and stenting to RCA;   05/ 2000 inferior wall MI , 06-06-1998 s/p cabg x3;   Last heart cath 2005,  2 patent grafts (diagnol and RCA) and occluded LIMA--LAD graft with normal LAD nonobstructive disease;  last nuclear study 11/ 2014 no evidence ishcemia, ef 40%   Full dentures    Hiatal hernia    History of acute inferior wall MI 05/1998   s/p  cabg   Hypertension    Mixed hyperlipidemia    Nocturia more than twice per night    PAD (peripheral artery disease) (HCC)    left common iliac artery stenosis per aorta ultrasound 03/ 2021 in epic and cardiology note   Prostate cancer [facility]) urologist--- dr Jeffie Pollock   first dx 07/ 2015 in active survillance until bx 11-11-2019,  Stage T2a, Gleason 3+4, PSA 13.3   S/P CABG x 3 06/06/1998   LIMA--LAD, SVG to Diagonal, RIMA to RCA   S/P primary angioplasty with coronary stent 08/1997  stent to RCA   Type 2 diabetes mellitus ([place])    followed by pcp  (03-06-2020 checks blood sugar dialy in am,  fasting sugar-- 110-130)     Surgical History:  Past Surgical History:  Procedure Laterality Date   APPENDECTOMY  child   CARDIAC CATHETERIZATION  09/01/2000  '@[facility]'$    patent RCA and Diagonal grafts, atretic LIMA, moderate nonobstructive proxLAD, normal lvsf   CARDIAC CATHETERIZATION  04-21-2003  '@[facility]    LIMA--LAD graft occluded, native LAD with nonobstructive disease,  patent diagonal/ rca grafts    CATARACT EXTRACTION     CATARACT EXTRACTION W/ INTRAOCULAR LENS IMPLANT Right 12/2018   CORONARY ANGIOPLASTY WITH STENT PLACEMENT  08/1997  '@[facility]    ptca w/ stenting to rca   CORONARY ARTERY BYPASS GRAFT  06-06-1998  '@[facility]     LIMA -- LAD, SVG -- Diagonal,  RIMA to RCA   CYSTOSCOPY N/A 03/09/2020   Procedure: CYSTOSCOPY FLEXIBLE;  Surgeon: [name], MD;  Location[facility];  Service: Urology;  Laterality: N/A;  NO SEEDS FOUND IN BLADDER   INGUINAL HERNIA REPAIR Right 1974   PROSTATE BIOPSY  06/2017   x 4-5    RADIOACTIVE SEED IMPLANT N/A 03/09/2020   Procedure: RADIOACTIVE SEED IMPLANT/BRACHYTHERAPY IMPLANT;  Surgeon: [name], MD;  Location: [facility];  Service: Urology;  Laterality: N/A;   70  SEEDS IMPLANTED   SPACE OAR INSTILLATION N/A 03/09/2020   Procedure: SPACE OAR INSTILLATION;  Surgeon: [name], MD;  Location: [facility];  Service: Urology;  Laterality: N/A;   VENTRAL HERNIA REPAIR  09/ 2000 and recurrent repair 06/ 2001     Medications Prior to Admission  Medication Sig Dispense Refill Last Dose   atorvastatin (LIPITOR) 40 MG tablet Take 1 tablet (40 mg total) by mouth daily. 90 tablet 1 08/27/2020   Cholecalciferol (D3 ADULT PO) Take 1 tablet by mouth daily.   08/28/2020   CRANBERRY PO Take 1 capsule by mouth daily.   08/28/2020   glipiZIDE (GLUCOTROL XL) 10 MG 24 hr tablet Take 1 tablet (10 mg total) by mouth daily. 90 tablet 1 08/28/2020   glucose blood (ONETOUCH VERIO) test strip Test 1X per day and as needed  Dx 250.02 100 each 12    Lancets (ONETOUCH ULTRASOFT) lancets Patient test 1X per day and prn  Dx 250.02 100 each 12    lisinopril (ZESTRIL) 2.5 MG tablet Take 1 tablet (2.5 mg total) by mouth daily. 90 tablet 1 08/28/2020   metFORMIN (GLUCOPHAGE) 1000 MG tablet Take 1 tablet (1,000 mg total) by mouth 2 (two) times daily with a meal. 180 tablet 1 08/28/2020   metoprolol succinate (TOPROL-XL) 50 MG 24 hr tablet Take 1  tablet (50 mg total) by mouth daily. Take with or immediately following a meal. 90 tablet 1 08/28/2020 at 0830   omeprazole (PRILOSEC) 40 MG capsule Take 1 capsule (40 mg total) by mouth daily. 90 capsule 1 08/28/2020   vitamin C (ASCORBIC ACID) 500 MG tablet Take 500 mg by mouth daily.   08/28/2020   [DISCONTINUED] aspirin 81 MG tablet Take 81 mg by mouth daily.   08/27/2020    Inpatient Medications:    stroke: mapping our early stages of recovery book   Does not apply Once   aspirin  81 mg Oral Daily   atorvastatin  40 mg Oral Daily   clopidogrel  75 mg Oral Daily   enoxaparin (LOVENOX) injection  40 mg Subcutaneous Q24H  insulin aspart  0-9 Units Subcutaneous TID WC   metoprolol succinate  50 mg Oral Daily   pantoprazole  40 mg Oral Daily    Allergies:  Allergies  Allergen Reactions   Morphine Shortness Of Breath and Swelling    Social History   Socioeconomic History   Marital status: Married    Spouse name: Vaughan Basta    Number of children: 3   Years of education: Not on file   Highest education level: Not on file  Occupational History   Occupation: part-time trucker    Comment: retired   Tobacco Use   Smoking status: Former    Packs/day: 4.00    Years: 45.00    Pack years: 180.00    Types: Cigarettes    Quit date: 01/13/1997    Years since quitting: 23.6   Smokeless tobacco: Never   Tobacco comments:    reports he began smoking at age 61  Vaping Use   Vaping Use: Never used  Substance and Sexual Activity   Alcohol use: No   Drug use: Never   Sexual activity: Not on file  Other Topics Concern   Not on file  Social History Narrative   Lives home with wife   Social Determinants of Health   Financial Resource Strain: Low Risk    Difficulty of Paying Living Expenses: Not very hard  Food Insecurity: No Food Insecurity   Worried About Charity fundraiser in the Last Year: Never true   [place] in the Last Year: Never true  Transportation Needs: No  Transportation Needs   Lack of Transportation (Medical): No   Lack of Transportation (Non-Medical): No  Physical Activity: Sufficiently Active   Days of Exercise per Week: 7 days   Minutes of Exercise per Session: 30 min  Stress: No Stress Concern Present   Feeling of Stress : Only a little  Social Connections: Engineer, building services of Communication with Friends and Family: More than three times a week   Frequency of Social Gatherings with Friends and Family: More than three times a week   Attends Religious Services: More than 4 times per year   Active Member of [employer] or Organizations: Yes   Attends Music therapist: More than 4 times per year   Marital Status: Married  Human resources officer Violence: Not At Risk   Fear of Current or Ex-Partner: No   Emotionally Abused: No   Physically Abused: No   Sexually Abused: No     Family History  Problem Relation Age of Onset   Lung cancer Mother    Dementia Father    Hyperlipidemia Father    Congestive Heart Failure Father    Post-traumatic stress disorder Son    Alcohol abuse Maternal Grandfather    Cancer Maternal Grandfather 90       stomach cancer    Heart attack Neg Hx    Stroke Neg Hx    Breast cancer Neg Hx    Colon cancer Neg Hx    Prostate cancer Neg Hx    Pancreatic cancer Neg Hx       Review of Systems: All other systems reviewed and are otherwise negative except as noted above.  Physical Exam: Vitals:   08/30/20 0002 08/30/20 0315 08/30/20 0800 08/30/20 1152  BP: (!) 158/61 (!) 153/69 127/79 (!) 145/77  Pulse: 66 66 78 62  Resp: '18 19 18 18  '$ Temp: 98.1 F (36.7 C) (!) 97.5 F (36.4 C)  97.7 F (36.5 C) (!) 97.5 F (36.4 C)  TempSrc: Oral Oral Oral Oral  SpO2: 97% 96% 97% 96%  Weight:      Height:        GEN- The patient is well appearing, alert and oriented x 3 today.   Head- normocephalic, atraumatic Eyes-  Sclera clear, conjunctiva pink Ears- hearing intact Oropharynx-  clear Neck- supple Lungs- CTA b/l, normal work of breathing Heart- RRR, no murmurs, rubs or gallops  GI- soft, NT, ND Extremities- no clubbing, cyanosis, or edema MS- no significant deformity or atrophy Skin- no rash or lesion Psych- euthymic mood, full affect   Labs:   Lab Results  Component Value Date   WBC 8.0 08/28/2020   HGB 13.2 08/28/2020   HCT 41.0 08/28/2020   MCV 87.2 08/28/2020   PLT 246 08/28/2020    Recent Labs  Lab 08/28/20 1345  NA 140  K 4.0  CL 103  CO2 27  BUN 18  CREATININE 1.67*  CALCIUM 9.5  PROT 7.5  BILITOT 0.5  ALKPHOS 95  ALT 16  AST 15  GLUCOSE 82   No results found for: CKTOTAL, CKMB, CKMBINDEX, TROPONINI Lab Results  Component Value Date   CHOL 121 08/28/2020   CHOL 101 07/20/2020   CHOL 119 01/20/2020   Lab Results  Component Value Date   HDL 31 (L) 08/28/2020   HDL 29 (L) 07/20/2020   HDL 30 (L) 01/20/2020   Lab Results  Component Value Date   LDLCALC 72 08/28/2020   LDLCALC 55 07/20/2020   LDLCALC 68 01/20/2020   Lab Results  Component Value Date   TRIG 89 08/28/2020   TRIG 87 07/20/2020   TRIG 116 01/20/2020   Lab Results  Component Value Date   CHOLHDL 3.9 08/28/2020   CHOLHDL 3.5 07/20/2020   CHOLHDL 4.0 01/20/2020   No results found for: LDLDIRECT  No results found for: DDIMER   Radiology/Studies: CT HEAD WO CONTRAST (5MM)  Result Date: 08/28/2020 CLINICAL DATA:  Monocular vision loss EXAM: CT HEAD WITHOUT CONTRAST TECHNIQUE: Contiguous axial images were obtained from the base of the skull through the vertex without intravenous contrast. COMPARISON:  Head CT 05/16/2004 FINDINGS: Brain: There is no evidence of acute intracranial hemorrhage, extra-axial fluid collection, or infarct. Vascular: There is calcification of the bilateral cavernous ICAs. Skull: Normal. Negative for fracture or focal lesion. Sinuses/Orbits: There is mucosal thickening throughout the ethmoid air cells. There is a right mastoid effusion.  The patient is status post left cataract extraction. The globes and orbits are otherwise unremarkable. Other: None. IMPRESSION: 1. No acute intracranial pathology. 2. Status post left cataract extraction. Otherwise, unremarkable globes and orbits. Electronically Signed   By: Valetta Mole M.D.   On: 08/28/2020 14:40   MR ANGIO HEAD WO CONTRAST Result Date: 08/29/2020 CLINICAL DATA:  Stroke suspected (Ped 0-18y). Additional history obtained from [place] eye vision loss. EXAM: MRI HEAD WITHOUT AND WITH CONTRAST MRA HEAD WITHOUT CONTRAST TECHNIQUE: Multiplanar, multi-echo pulse sequences of the brain and surrounding structures were acquired without and with intravenous contrast. Angiographic images of the Circle of Willis were acquired using MRA technique without intravenous contrast. CONTRAST:  60m GADAVIST GADOBUTROL 1 MMOL/ML IV SOLN COMPARISON:  Noncontrast head CT performed earlier today 08/28/2020. FINDINGS: MRI HEAD FINDINGS Brain: Mild generalized cerebral and cerebellar atrophy. 2 mm focus of diffusion-weighted hyperintense signal abnormality within the left parietal lobe subcortical white matter compatible with acute or early subacute infarct (series 5,  image 89) (series 7, image 46). A few small scattered foci of T2/FLAIR hyperintensity within the cerebral white matter are nonspecific, but compatible with chronic small vessel ischemic disease. No evidence of an intracranial mass. No chronic intracranial blood products. No extra-axial fluid collection. No midline shift. No pathologic intracranial enhancement. Vascular: Maintained flow voids within the proximal large arterial vessels. Skull and upper cervical spine: No focal suspicious marrow lesion. Sinuses/Orbits: Visualized orbits show no acute finding. Left lens replacement. Mild mucosal thickening within the bilateral ethmoid air cells. Other: Small bilateral mastoid effusions. MRA HEAD FINDINGS Anterior circulation: The  intracranial internal carotid arteries are patent. Mild atherosclerotic irregularity of both vessels. The M1 middle cerebral arteries are patent. No M2 proximal branch occlusion or high-grade proximal stenosis is identified. The anterior cerebral arteries are patent. Partially azygos configuration of the anterior cerebral arteries at the A2 level. 1 mm inferiorly projecting vascular protrusion arising from the supraclinoid left ICA, which may reflect an aneurysm or infundibulum (series 5, image 103). 2 mm posteriorly projecting vascular protrusion arising from the proximal cavernous right ICA, likely reflecting an aneurysm (series 1048, image 247). Posterior circulation: The intracranial vertebral arteries are patent. The basilar artery is patent. The posterior cerebral arteries are patent. The posterior cerebral arteries are patent. Posterior communicating arteries are hypoplastic or absent bilaterally. Anatomic variants: As described IMPRESSION: MRI brain: 1. 2 mm acute or early subacute infarct within the left parietal lobe subcortical white matter. 2. Minimal chronic small vessel ischemic changes within the cerebral white matter. 3. Mild generalized cerebral and cerebellar atrophy. 4. Mild bilateral ethmoid sinus mucosal thickening. 5. Small bilateral mastoid effusions. MRA head: 1. No intracranial large vessel occlusion or proximal high-grade arterial stenosis. 2. Mild atherosclerotic irregularity of the intracranial internal carotid arteries. 3. 2 mm aneurysm arising from the cavernous right ICA. 4. 1 mm aneurysm versus infundibulum arising from the supraclinoid left ICA. Electronically Signed   By: Kellie Simmering D.O.   On: 08/29/2020 12:30   MR ANGIO NECK W WO CONTRAST  Result Date: 08/29/2020 CLINICAL DATA:  Stroke, follow-up. EXAM: MRA NECK WITHOUT AND WITH CONTRAST TECHNIQUE: Multiplanar and multiecho pulse sequences of the neck were obtained without and with intravenous contrast. Angiographic images of  the neck were obtained using MRA technique without and with intravenous contrast. CONTRAST:  56m GADAVIST GADOBUTROL 1 MMOL/ML IV SOLN COMPARISON:  Same day MRA of the head 08/29/2020. FINDINGS: Mildly motion degraded examination. Common origin of the innominate and left common carotid arteries. The visualized aortic arch is normal in caliber. No hemodynamically significant innominate or proximal subclavian artery stenosis. Saccular aneurysm arising from the proximal left subclavian artery measuring 9 x 9 mm (series 1003, image 2). The right CCA and ICA are patent within the neck without hemodynamically significant stenosis (50% or greater). Mild atherosclerotic irregularity about the carotid bifurcation. The left CCA and ICA are patent within the neck without hemodynamically significant stenosis (50% or greater). Mild atherosclerotic irregularity about the carotid bifurcation. Vertebral arteries codominant and patent within the neck. Mild atherosclerotic narrowing at the origin of the right vertebral artery. There is at least moderate atherosclerotic narrowing at the origin of the left vertebral artery. IMPRESSION: The common and internal carotid arteries are patent within the neck without hemodynamically significant stenosis (50% or greater). Mild atherosclerotic irregularity about both carotid bifurcations. The vertebral arteries are codominant and patent within the neck. At least moderate atherosclerotic narrowing at the origin of the left vertebral artery. Mild atherosclerotic narrowing at the  origin of the right vertebral artery. 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 Electronically Signed   By: Kellie Simmering D.O.   On: 08/29/2020 12:41    12-lead ECG SR All prior EKG's in EPIC reviewed with no documented atrial fibrillation  Telemetry SR  Assessment and Plan:  1. Cryptogenic stroke The patient presents with cryptogenic stroke.   I spoke at length with the  patient and his wife at bedside about monitoring for afib with either a 30 day event monitor or an implantable loop recorder.  Risks, benefits, and alteratives to implantable loop recorder were discussed with the patient today.   At this time, the patient is very clear in their decision to proceed with implantable loop recorder.   Wound care was reviewed with the patient (keep incision clean and dry for 3 days).  Wound check follow up will be scheduled for the patient.  Please call with questions.   Britta Louth Dyane Dustman, PA-C 08/30/2020

## 2020-08-30 NOTE — Discharge Instructions (Signed)
Post procedure wound care instructions Keep incision clean and dry for 3 days. You can remove outer dressing tomorrow. Leave steri-strips (little pieces of tape) on until seen in the office for wound check appointment. Call the office ([phone]) for redness, drainage, swelling, or fever.  

## 2020-08-30 NOTE — Plan of Care (Signed)
  Problem: Education: Goal: Knowledge of General Education information will improve Description: Including pain rating scale, medication(s)/side effects and non-pharmacologic comfort measures Outcome: Progressing   Problem: Health Behavior/Discharge Planning: Goal: Ability to manage health-related needs will improve Outcome: Progressing   Problem: Clinical Measurements: Goal: Ability to maintain clinical measurements within normal limits will improve Outcome: Progressing Goal: Will remain free from infection Outcome: Progressing Goal: Diagnostic test results will improve Outcome: Progressing Goal: Respiratory complications will improve Outcome: Progressing Goal: Cardiovascular complication will be avoided Outcome: Progressing   Problem: Activity: Goal: Risk for activity intolerance will decrease Outcome: Progressing   Problem: Nutrition: Goal: Adequate nutrition will be maintained Outcome: Progressing   Problem: Coping: Goal: Level of anxiety will decrease Outcome: Progressing   Problem: Elimination: Goal: Will not experience complications related to bowel motility Outcome: Progressing Goal: Will not experience complications related to urinary retention Outcome: Progressing   Problem: Pain Managment: Goal: General experience of comfort will improve Outcome: Progressing   Problem: Safety: Goal: Ability to remain free from injury will improve Outcome: Progressing   Problem: Skin Integrity: Goal: Risk for impaired skin integrity will decrease Outcome: Progressing   Problem: Education: Goal: Knowledge of disease or condition will improve Outcome: Progressing Goal: Knowledge of secondary prevention will improve Outcome: Progressing Goal: Knowledge of patient specific risk factors addressed and post discharge goals established will improve Outcome: Progressing Goal: Individualized Educational Video(s) Outcome: Progressing   Problem: Health Behavior/Discharge  Planning: Goal: Ability to manage health-related needs will improve Outcome: Progressing

## 2020-08-30 NOTE — Progress Notes (Signed)
SLP Cancellation Note  Patient Details Name: Jacob Rios MRN: [id number] DOB: 1945-02-18   Cancelled treatment:       Reason Eval/Treat Not Completed: SLP screened, no needs identified, will sign off. Per staff, clinical notes, and pt and family report, no cognitive linguistic changes post small CVA.    [place], CCC-SLP Acute Rehabilitation Services   08/30/2020, 12:07 PM

## 2020-08-30 NOTE — Progress Notes (Signed)
STROKE TEAM PROGRESS NOTE    Interval History   No acute events overnight, patient is resting in bed with his wife at bedside.   He continues to have vision loss in the left eye on the bottom half of his vision but feels it may be his degree to get slightly better.  He is scheduled for a loop recorder this afternoon prior to discharge home.  2D echo showed ejection fraction 50 to 55% without definite cardiac source of embolism   Pertinent Lab Work and Imaging    08/29/20 CT Head WO IV Contrast 1. No acute intracranial pathology. 2. Status post left cataract extraction. Otherwise, unremarkable globes and orbits.  08/29/20 MRA Head + MRI Brain  MRI brain: 1. 2 mm acute or early subacute infarct within the left parietal lobe subcortical white matter. 2. Minimal chronic small vessel ischemic changes within the cerebral white matter. 3. Mild generalized cerebral and cerebellar atrophy. 4. Mild bilateral ethmoid sinus mucosal thickening. 5. Small bilateral mastoid effusions.   MRA head: 1. No intracranial large vessel occlusion or proximal high-grade arterial stenosis. 2. Mild atherosclerotic irregularity of the intracranial internal carotid arteries. 3. 2 mm aneurysm arising from the cavernous right ICA. 4. 1 mm aneurysm versus infundibulum arising from the supraclinoid left ICA.  08/29/20 MRA Neck  The common and internal carotid arteries are patent within the neck without hemodynamically significant stenosis (50% or greater). Mild atherosclerotic irregularity about both carotid bifurcations.   The vertebral arteries are codominant and patent within the neck. At least moderate atherosclerotic narrowing at the origin of the left vertebral artery. Mild atherosclerotic narrowing at the origin of the right vertebral artery.   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  08/28/20 Echocardiogram Complete   1. Left ventricular ejection fraction, by  estimation, is 50 to 55%. The left ventricle has low normal function. The left ventricle has no regional wall motion abnormalities. There is moderate left ventricular hypertrophy. Left ventricular diastolic parameters were normal.   2. Right ventricular systolic function is normal. The right ventricular size is normal.   3. Left atrial size was mildly dilated.   4. The mitral valve is normal in structure. Trivial mitral valve regurgitation. No evidence of mitral stenosis.   5. Very mild AS with AVA 1.87 and mean gradient only 6 peak 11 mmhg no obvious sub aortic web . The aortic valve is tricuspid. There is moderate calcification of the aortic valve. There is moderate thickening of the aortic valve. Aortic valve regurgitation is not visualized. Mild aortic valve stenosis.   6. Aortic dilatation noted. There is mild dilatation of the aortic root, measuring 39 mm.   7. The inferior vena cava is normal in size with greater than 50% respiratory variability, suggesting right atrial pressure of 3 mmHg.   Physical Examination   Constitutional: Calm, appropriate for condition  Cardiovascular: Normal RR Respiratory: No increased WOB   Mental status: AAOx4, following commands  Speech: Fluent, repetition and naming intact  Cranial nerves: EOMI, Loss of vision to the bottom visual fields to his left eye, Face symmetric, Tongue midline  Motor: Normal bulk and tone. No drift. Strength 5/5 throughout  Sensory: Intact to light touch throughout  Coordination: FNF intact Gait: Deferred  Assessment and Plan   [name] is a 75 y.o. male w/pmh of HTN, CKD, CAD s/p MI and CABG, PAD and DM II, hypertensive retinopathy, and cataracts who presented from his Ophthalmology office  with BRAO. He was outside of the time window for IVTPA and thrombectomy.   #L BRAO  #L Parietal Stroke  Patient presented with the symptoms described above. At this time, his work up is complete. His MRI Brain was pertinent for a  Left Parietal stroke. MRA Head and Neck did not show significant stenosis to the left carotid system. Echo w/EF 50 to 55 %, LA moderately dilated. Stroke labs were completed including Lipid panel w/LDL 72 and Hemoglobin A1C 6.5. Etiology of his left parietal stroke and L BRAO are cryptogenic. Will need cardiac monitoring to rule out cardioembolic etiology given he has no significant stenosis.  - DAPT for 21 days followed by Plavix monotherapy given he was taking ASA daily at home when stroke occurred  - ILR to evaluate for atrial fibrillation ( please consult EP for this)  - Continue Atorvastatin 40 mg for stroke prevention  - At discharge please place ambulatory referral to neurology for stroke follow up   #Hypertension He has a history of HTN. Recommend permissive hypertension 48 hours post stroke and from there, gradually reduce the blood pressure, avoiding any acute drops. Long term blood pressure goal is < 140/90.  #Hyperlipidemia From a stroke prevention stand point, the LDL goal is < 70. LDL is close to goal at 72, thus home statin was continued.   #Stroke Dysphagia Screening Passed bedside swallow evaluation for regular diet  #DMII  Hemoglobin A1C this admission noted to be 6.5, at goal from a stroke standpoint.   Hospital day # 0   Patient presented with altitudinal vision loss in the left eye in the lower field likely due to retinal artery branch occlusion of embolic etiology.  Strong suspicion for atrial fibrillation.  MRI scan of the brain shows an embolic left parietal infarct just clinically silent.  Recommend Loop recorder prior to discharge..  Dual antiplatelet therapy aspirin and Plavix for 3 weeks followed by Plavix alone and aggressive risk factor modification.  Long discussion with patient and wife and answered questions.  Greater than 50% time during this 25-minute visit was spent in counseling and coordination of care and discussion with care team about stroke prevention and  treatment.  Discussed with Dr. Gloris Ham stroke team will sign off.  Follow-up in outpatient stroke clinic in 6 weeks.  Antony Contras, MD Medical Director [facility] Pager: [phone] 08/30/2020 2:10 PM   To contact Stroke Continuity provider, please refer to [email or web address] After hours, contact General Neurology

## 2020-08-30 NOTE — Discharge Summary (Signed)
Physician Discharge Summary  Jacob Rios [id number] DOB: 1945/03/27 DOA: 08/28/2020  PCP: [name], FNP  Admit date: 08/28/2020 Discharge date: 08/30/2020  Admitted From: home Disposition:  home  Recommendations for Outpatient Follow-up:  Follow up with PCP in 1-2 weeks Follow-up with neurology as an outpatient Continue aspirin and Plavix for 3 weeks then Plavix alone  Home Health: none Equipment/Devices: none  Discharge Condition: stable CODE STATUS: Full code Diet recommendation: heart healthy  HPI: Per admitting MD, Jacob Rios is a 75 y.o. male with medical history significant of HTN, HLD, CAD s/p CABG, diabetes mellitus type 2 presents with complaints of vision loss out of his left eye.  Symptoms started 4 days ago and around 8 AM.  He complained of blurry vision out of his left eye while he was watching TV, but noted everything subsequently going black like a curtain coming down.  Symptoms lasted approximately 30 minutes before he regain vision.  During this time patient reports that he had been dealing with a sinus infection and had taken some Vicks sinus medicine.  Symptoms did not reappear until yesterday a evening.  Symptoms are similar but reported x-ray showed drip across the middle of his eye field.  He went and was evaluated by his eye doctor today and diagnosed with root prescription occlusion.  He denies having any significant headache, change in speech, focal weakness, palpitations, chest pain, nausea, vomiting, or diarrhea symptoms.  Hospital Course / Discharge diagnoses: Principal problem Left branch superior branch retinal artery occlusion -diagnosed by ophthalmology, Dr. Coralyn Pear. Retina OCT: Diffuse inner retinal hyper reflectivity superior macula extending to fovea, partial PVD consistent with BRAO affecting superior macula and fovea. Fluorescein Angiography Optos testing revealed severely delayed filling and venous return in the OS consistent  with superior BRAO. ROS for GCA and inflammatory markers are both negative.  No retinal or ophthalmic intervention indicated or recommended. Follow up with ophthalmology in 3-4 weeks.    Active problems CVA, 47m acute or early subacute left parietal lobe subcortical infarct - MRA without LVO.   Neurology recommended DAPT x3 weeks then plavix alone (ASA monoTx PTA).  Patient will get a loop recorder prior to discharge. LDL 72. Home atorvastatin '40mg'$  continued.  HbA1c 6.5%.  Will need neurology follow up.  T2DM - At goal A1c, continue home medications.  HTN - Permissive HTN, planning to return to usual goal over 48-72 hours.  CAD s/p CABG Stage IIIa CKD - Near baseline. Risk factor modification planned as above.  2 mm aneurysm arising from the cavernous right ICA. 4. 1 mm aneurysm versus infundibulum arising from the supraclinoid left ICA.   Sepsis ruled out   Discharge Instructions   Allergies as of 08/30/2020       Reactions   Morphine Shortness Of Breath, Swelling        Medication List     TAKE these medications    aspirin 81 MG tablet Take 1 tablet (81 mg total) by mouth daily for 21 days.   atorvastatin 40 MG tablet Commonly known as: LIPITOR Take 1 tablet (40 mg total) by mouth daily.   clopidogrel 75 MG tablet Commonly known as: PLAVIX Take 1 tablet (75 mg total) by mouth daily. Start taking on: August 31, 2020   CRANBERRY PO Take 1 capsule by mouth daily.   D3 ADULT PO Take 1 tablet by mouth daily.   glipiZIDE 10 MG 24 hr tablet Commonly known as: GLUCOTROL XL Take 1 tablet (10 mg  total) by mouth daily.   glucose blood test strip Commonly known as: OneTouch Verio Test 1X per day and as needed  Dx 250.02   lisinopril 2.5 MG tablet Commonly known as: ZESTRIL Take 1 tablet (2.5 mg total) by mouth daily.   metFORMIN 1000 MG tablet Commonly known as: GLUCOPHAGE Take 1 tablet (1,000 mg total) by mouth 2 (two) times daily with a meal.   metoprolol  succinate 50 MG 24 hr tablet Commonly known as: TOPROL-XL Take 1 tablet (50 mg total) by mouth daily. Take with or immediately following a meal.   omeprazole 40 MG capsule Commonly known as: PRILOSEC Take 1 capsule (40 mg total) by mouth daily.   onetouch ultrasoft lancets Patient test 1X per day and prn  Dx 250.02   vitamin C 500 MG tablet Commonly known as: ASCORBIC ACID Take 500 mg by mouth daily.        Follow-up Information     [facility].   Specialty: Cardiology Why: 09/06/20 @ 4:00PM, wound check visit (heart monitor) Contact information: [address] [place] [phone]              Consultations: Neurology  Cardiology / EP  Procedures/Studies:  CT HEAD WO CONTRAST (5MM)  Result Date: 08/28/2020 CLINICAL DATA:  Monocular vision loss EXAM: CT HEAD WITHOUT CONTRAST TECHNIQUE: Contiguous axial images were obtained from the base of the skull through the vertex without intravenous contrast. COMPARISON:  Head CT 05/16/2004 FINDINGS: Brain: There is no evidence of acute intracranial hemorrhage, extra-axial fluid collection, or infarct. Vascular: There is calcification of the bilateral cavernous ICAs. Skull: Normal. Negative for fracture or focal lesion. Sinuses/Orbits: There is mucosal thickening throughout the ethmoid air cells. There is a right mastoid effusion. The patient is status post left cataract extraction. The globes and orbits are otherwise unremarkable. Other: None. IMPRESSION: 1. No acute intracranial pathology. 2. Status post left cataract extraction. Otherwise, unremarkable globes and orbits. Electronically Signed   By: Valetta Mole M.D.   On: 08/28/2020 14:40   MR ANGIO HEAD WO CONTRAST  Result Date: 08/29/2020 CLINICAL DATA:  Stroke suspected (Ped 0-18y). Additional history obtained from [place] [facility] vision loss. EXAM: MRI HEAD WITHOUT AND WITH CONTRAST MRA  HEAD WITHOUT CONTRAST TECHNIQUE: Multiplanar, multi-echo pulse sequences of the brain and surrounding structures were acquired without and with intravenous contrast. Angiographic images of the Circle of Willis were acquired using MRA technique without intravenous contrast. CONTRAST:  53m GADAVIST GADOBUTROL 1 MMOL/ML IV SOLN COMPARISON:  Noncontrast head CT performed earlier today 08/28/2020. FINDINGS: MRI HEAD FINDINGS Brain: Mild generalized cerebral and cerebellar atrophy. 2 mm focus of diffusion-weighted hyperintense signal abnormality within the left parietal lobe subcortical white matter compatible with acute or early subacute infarct (series 5, image 89) (series 7, image 46). A few small scattered foci of T2/FLAIR hyperintensity within the cerebral white matter are nonspecific, but compatible with chronic small vessel ischemic disease. No evidence of an intracranial mass. No chronic intracranial blood products. No extra-axial fluid collection. No midline shift. No pathologic intracranial enhancement. Vascular: Maintained flow voids within the proximal large arterial vessels. Skull and upper cervical spine: No focal suspicious marrow lesion. Sinuses/Orbits: Visualized orbits show no acute finding. Left lens replacement. Mild mucosal thickening within the bilateral ethmoid air cells. Other: Small bilateral mastoid effusions. MRA HEAD FINDINGS Anterior circulation: The intracranial internal carotid arteries are patent. Mild atherosclerotic irregularity of both vessels. The  M1 middle cerebral arteries are patent. No M2 proximal branch occlusion or high-grade proximal stenosis is identified. The anterior cerebral arteries are patent. Partially azygos configuration of the anterior cerebral arteries at the A2 level. 1 mm inferiorly projecting vascular protrusion arising from the supraclinoid left ICA, which may reflect an aneurysm or infundibulum (series 5, image 103). 2 mm posteriorly projecting vascular protrusion  arising from the proximal cavernous right ICA, likely reflecting an aneurysm (series 1048, image 247). Posterior circulation: The intracranial vertebral arteries are patent. The basilar artery is patent. The posterior cerebral arteries are patent. The posterior cerebral arteries are patent. Posterior communicating arteries are hypoplastic or absent bilaterally. Anatomic variants: As described IMPRESSION: MRI brain: 1. 2 mm acute or early subacute infarct within the left parietal lobe subcortical white matter. 2. Minimal chronic small vessel ischemic changes within the cerebral white matter. 3. Mild generalized cerebral and cerebellar atrophy. 4. Mild bilateral ethmoid sinus mucosal thickening. 5. Small bilateral mastoid effusions. MRA head: 1. No intracranial large vessel occlusion or proximal high-grade arterial stenosis. 2. Mild atherosclerotic irregularity of the intracranial internal carotid arteries. 3. 2 mm aneurysm arising from the cavernous right ICA. 4. 1 mm aneurysm versus infundibulum arising from the supraclinoid left ICA. Electronically Signed   By: Kellie Simmering D.O.   On: 08/29/2020 12:30   MR ANGIO NECK W WO CONTRAST  Result Date: 08/29/2020 CLINICAL DATA:  Stroke, follow-up. EXAM: MRA NECK WITHOUT AND WITH CONTRAST TECHNIQUE: Multiplanar and multiecho pulse sequences of the neck were obtained without and with intravenous contrast. Angiographic images of the neck were obtained using MRA technique without and with intravenous contrast. CONTRAST:  77m GADAVIST GADOBUTROL 1 MMOL/ML IV SOLN COMPARISON:  Same day MRA of the head 08/29/2020. FINDINGS: Mildly motion degraded examination. Common origin of the innominate and left common carotid arteries. The visualized aortic arch is normal in caliber. No hemodynamically significant innominate or proximal subclavian artery stenosis. Saccular aneurysm arising from the proximal left subclavian artery measuring 9 x 9 mm (series 1003, image 2). The right CCA  and ICA are patent within the neck without hemodynamically significant stenosis (50% or greater). Mild atherosclerotic irregularity about the carotid bifurcation. The left CCA and ICA are patent within the neck without hemodynamically significant stenosis (50% or greater). Mild atherosclerotic irregularity about the carotid bifurcation. Vertebral arteries codominant and patent within the neck. Mild atherosclerotic narrowing at the origin of the right vertebral artery. There is at least moderate atherosclerotic narrowing at the origin of the left vertebral artery. IMPRESSION: The common and internal carotid arteries are patent within the neck without hemodynamically significant stenosis (50% or greater). Mild atherosclerotic irregularity about both carotid bifurcations. The vertebral arteries are codominant and patent within the neck. At least moderate atherosclerotic narrowing at the origin of the left vertebral artery. Mild atherosclerotic narrowing at the origin of the right vertebral artery. 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 Electronically Signed   By: KKellie SimmeringD.O.   On: 08/29/2020 12:41   MR BRAIN W WO CONTRAST  Result Date: 08/29/2020 CLINICAL DATA:  Stroke suspected (Ped 0-18y). Additional history obtained from [facility] [place] [facility]. EXAM: MRI HEAD WITHOUT AND WITH CONTRAST MRA HEAD WITHOUT CONTRAST TECHNIQUE: Multiplanar, multi-echo pulse sequences of the brain and surrounding structures were acquired without and with intravenous contrast. Angiographic images of the Circle of Willis were acquired using MRA technique without intravenous contrast. CONTRAST:  942mGADAVIST GADOBUTROL 1 MMOL/ML IV  SOLN COMPARISON:  Noncontrast head CT performed earlier today 08/28/2020. FINDINGS: MRI HEAD FINDINGS Brain: Mild generalized cerebral and cerebellar atrophy. 2 mm focus of diffusion-weighted hyperintense signal abnormality within the left  parietal lobe subcortical white matter compatible with acute or early subacute infarct (series 5, image 89) (series 7, image 46). A few small scattered foci of T2/FLAIR hyperintensity within the cerebral white matter are nonspecific, but compatible with chronic small vessel ischemic disease. No evidence of an intracranial mass. No chronic intracranial blood products. No extra-axial fluid collection. No midline shift. No pathologic intracranial enhancement. Vascular: Maintained flow voids within the proximal large arterial vessels. Skull and upper cervical spine: No focal suspicious marrow lesion. Sinuses/Orbits: Visualized orbits show no acute finding. Left lens replacement. Mild mucosal thickening within the bilateral ethmoid air cells. Other: Small bilateral mastoid effusions. MRA HEAD FINDINGS Anterior circulation: The intracranial internal carotid arteries are patent. Mild atherosclerotic irregularity of both vessels. The M1 middle cerebral arteries are patent. No M2 proximal branch occlusion or high-grade proximal stenosis is identified. The anterior cerebral arteries are patent. Partially azygos configuration of the anterior cerebral arteries at the A2 level. 1 mm inferiorly projecting vascular protrusion arising from the supraclinoid left ICA, which may reflect an aneurysm or infundibulum (series 5, image 103). 2 mm posteriorly projecting vascular protrusion arising from the proximal cavernous right ICA, likely reflecting an aneurysm (series 1048, image 247). Posterior circulation: The intracranial vertebral arteries are patent. The basilar artery is patent. The posterior cerebral arteries are patent. The posterior cerebral arteries are patent. Posterior communicating arteries are hypoplastic or absent bilaterally. Anatomic variants: As described IMPRESSION: MRI brain: 1. 2 mm acute or early subacute infarct within the left parietal lobe subcortical white matter. 2. Minimal chronic small vessel ischemic  changes within the cerebral white matter. 3. Mild generalized cerebral and cerebellar atrophy. 4. Mild bilateral ethmoid sinus mucosal thickening. 5. Small bilateral mastoid effusions. MRA head: 1. No intracranial large vessel occlusion or proximal high-grade arterial stenosis. 2. Mild atherosclerotic irregularity of the intracranial internal carotid arteries. 3. 2 mm aneurysm arising from the cavernous right ICA. 4. 1 mm aneurysm versus infundibulum arising from the supraclinoid left ICA. Electronically Signed   By: Kellie Simmering D.O.   On: 08/29/2020 12:30   ECHOCARDIOGRAM COMPLETE  Result Date: 08/28/2020    ECHOCARDIOGRAM REPORT   Patient Name:   Jacob Rios Date of Exam: 08/28/2020 Medical Rec #:  [id number]      Height:       72.0 in Accession #:    [id number]     Weight:       230.0 lb Date of Birth:  Dec 14, 1945      BSA:          2.261 m Patient Age:    75 years       BP:           151/90 mmHg Patient Gender: M              HR:           94 bpm. Exam Location:  Inpatient Procedure: 2D Echo, Cardiac Doppler and Color Doppler Indications:    Stroke  History:        Patient has prior history of Echocardiogram examinations. CAD,                 Prior CABG; Risk Factors:Hypertension and Diabetes.  Sonographer:    Merrie Roof RDCS Referring Phys: [id number] [place]  1. Left ventricular ejection fraction, by estimation, is 50 to 55%. The left ventricle has low normal function. The left ventricle has no regional wall motion abnormalities. There is moderate left ventricular hypertrophy. Left ventricular diastolic parameters were normal.  2. Right ventricular systolic function is normal. The right ventricular size is normal.  3. Left atrial size was mildly dilated.  4. The mitral valve is normal in structure. Trivial mitral valve regurgitation. No evidence of mitral stenosis.  5. Very mild AS with AVA 1.87 and mean gradient only 6 peak 11 mmhg no obvious sub aortic web . The aortic valve is  tricuspid. There is moderate calcification of the aortic valve. There is moderate thickening of the aortic valve. Aortic valve regurgitation is not visualized. Mild aortic valve stenosis.  6. Aortic dilatation noted. There is mild dilatation of the aortic root, measuring 39 mm.  7. The inferior vena cava is normal in size with greater than 50% respiratory variability, suggesting right atrial pressure of 3 mmHg. FINDINGS  Left Ventricle: Left ventricular ejection fraction, by estimation, is 50 to 55%. The left ventricle has low normal function. The left ventricle has no regional wall motion abnormalities. The left ventricular internal cavity size was normal in size. There is moderate left ventricular hypertrophy. Left ventricular diastolic parameters were normal. Right Ventricle: The right ventricular size is normal. No increase in right ventricular wall thickness. Right ventricular systolic function is normal. Left Atrium: Left atrial size was mildly dilated. Right Atrium: Right atrial size was normal in size. Pericardium: There is no evidence of pericardial effusion. Mitral Valve: The mitral valve is normal in structure. Trivial mitral valve regurgitation. No evidence of mitral valve stenosis. Tricuspid Valve: The tricuspid valve is normal in structure. Tricuspid valve regurgitation is not demonstrated. No evidence of tricuspid stenosis. Aortic Valve: Very mild AS with AVA 1.87 and mean gradient only 6 peak 11 mmhg no obvious sub aortic web. The aortic valve is tricuspid. There is moderate calcification of the aortic valve. There is moderate thickening of the aortic valve. Aortic valve regurgitation is not visualized. Mild aortic stenosis is present. Aortic valve mean gradient measures 6.0 mmHg. Aortic valve peak gradient measures 11.3 mmHg. Aortic valve area, by VTI measures 1.94 cm. Pulmonic Valve: The pulmonic valve was normal in structure. Pulmonic valve regurgitation is not visualized. No evidence of pulmonic  stenosis. Aorta: The aortic root is normal in size and structure and aortic dilatation noted. There is mild dilatation of the aortic root, measuring 39 mm. Venous: The inferior vena cava is normal in size with greater than 50% respiratory variability, suggesting right atrial pressure of 3 mmHg. IAS/Shunts: No atrial level shunt detected by color flow Doppler.  LEFT VENTRICLE PLAX 2D LVIDd:         4.70 cm      Diastology LVIDs:         3.50 cm      LV e' medial:    4.68 cm/s LV PW:         1.30 cm      LV E/e' medial:  15.0 LV IVS:        1.50 cm      LV e' lateral:   11.60 cm/s LVOT diam:     2.30 cm      LV E/e' lateral: 6.1 LV SV:         81 LV SV Index:   36 LVOT Area:     4.15 cm  LV Volumes (MOD) LV  vol d, MOD A4C: 178.0 ml LV vol s, MOD A4C: 67.9 ml LV SV MOD A4C:     178.0 ml RIGHT VENTRICLE RV Basal diam:  3.10 cm LEFT ATRIUM             Index       RIGHT ATRIUM           Index LA diam:        4.10 cm 1.81 cm/m  RA Area:     14.30 cm LA Vol (A2C):   66.2 ml 29.28 ml/m RA Volume:   31.60 ml  13.98 ml/m LA Vol (A4C):   55.5 ml 24.55 ml/m LA Biplane Vol: 62.0 ml 27.42 ml/m  AORTIC VALVE AV Area (Vmax):    1.87 cm AV Area (Vmean):   1.94 cm AV Area (VTI):     1.94 cm AV Vmax:           168.00 cm/s AV Vmean:          112.000 cm/s AV VTI:            0.415 m AV Peak Grad:      11.3 mmHg AV Mean Grad:      6.0 mmHg LVOT Vmax:         75.80 cm/s LVOT Vmean:        52.300 cm/s LVOT VTI:          0.194 m LVOT/AV VTI ratio: 0.47  AORTA Ao Root diam: 4.05 cm Ao Asc diam:  3.30 cm MITRAL VALVE MV Area (PHT): 3.02 cm    SHUNTS MV Decel Time: 251 msec    Systemic VTI:  0.19 m MV E velocity: 70.30 cm/s  Systemic Diam: 2.30 cm MV A velocity: 99.00 cm/s MV E/A ratio:  0.71 Jenkins Rouge MD Electronically signed by Jenkins Rouge MD Signature Date/Time: 08/28/2020/5:25:34 PM    Final    OCT, Retina - OU - Both Eyes  Result Date: 08/28/2020 Right Eye Quality was good. Central Foveal Thickness: 287. Progression has no  prior data. Findings include normal foveal contour, no IRF, no SRF, vitreomacular adhesion . Left Eye Quality was good. Central Foveal Thickness: 307. Progression has no prior data. Findings include normal foveal contour, no IRF, no SRF, intraretinal hyper-reflective material, vitreomacular adhesion (Diffuse inner retinal hyper reflectivity superior macula extending to fovea, partial PVD). Notes *Images captured and stored on drive Diagnosis / Impression: OD: NFP, no IRF/SRF OS: BRAO affecting superior macula and fovea Clinical management: See below Abbreviations: NFP - Normal foveal profile. CME - cystoid macular edema. PED - pigment epithelial detachment. IRF - intraretinal fluid. SRF - subretinal fluid. EZ - ellipsoid zone. ERM - epiretinal membrane. ORA - outer retinal atrophy. ORT - outer retinal tubulation. SRHM - subretinal hyper-reflective material. IRHM - intraretinal hyper-reflective material   Fluorescein Angiography Optos (Transit OS)  Result Date: 08/28/2020 Right Eye Progression has no prior data. Early phase findings include normal observations. Mid/Late phase findings include normal observations. Left Eye Progression has no prior data. Early phase findings include delayed filling (Severely delayed filling and venous return). Mid/Late phase findings include normal observations. Notes **Images stored on drive** Impression: OD: normal study OS: superior BRAO -- Severely delayed filling and venous return     Subjective: - no chest pain, shortness of breath, no abdominal pain, nausea or vomiting.   Discharge Exam: BP 131/80 (BP Location: Right Arm)   Pulse 70   Temp 98.2 F (36.8 C) (Oral)   Resp 16  Ht '5\' 11"'$  (1.803 m)   Wt 68.5 kg   SpO2 96%   BMI 21.06 kg/m   General: Pt is alert, awake, not in acute distress Cardiovascular: RRR, S1/S2 +, no rubs, no gallops Respiratory: CTA bilaterally, no wheezing, no rhonchi Abdominal: Soft, NT, ND, bowel sounds + Extremities: no edema, no  cyanosis    The results of significant diagnostics from this hospitalization (including imaging, microbiology, ancillary and laboratory) are listed below for reference.     Microbiology: Recent Results (from the past 240 hour(s))  Resp Panel by RT-PCR (Flu A&B, Covid) Nasopharyngeal Swab     Status: None   Collection Time: 08/28/20  3:18 PM   Specimen: Nasopharyngeal Swab; Nasopharyngeal(NP) swabs in vial transport medium  Result Value Ref Range Status   SARS Coronavirus 2 by RT PCR NEGATIVE NEGATIVE Final    Comment: (NOTE) SARS-CoV-2 target nucleic acids are NOT DETECTED.  The SARS-CoV-2 RNA is generally detectable in upper respiratory specimens during the acute phase of infection. The lowest concentration of SARS-CoV-2 viral copies this assay can detect is 138 copies/mL. A negative result does not preclude SARS-Cov-2 infection and should not be used as the sole basis for treatment or other patient management decisions. A negative result may occur with  improper specimen collection/handling, submission of specimen other than nasopharyngeal swab, presence of viral mutation(s) within the areas targeted by this assay, and inadequate number of viral copies(<138 copies/mL). A negative result must be combined with clinical observations, patient history, and epidemiological information. The expected result is Negative.  Fact Sheet for Patients:  EntrepreneurPulse.com.au  Fact Sheet for Healthcare Providers:  IncredibleEmployment.be  This test is no t yet approved or cleared by the [place] FDA and  has been authorized for detection and/or diagnosis of SARS-CoV-2 by FDA under an Emergency Use Authorization (EUA). This EUA will remain  in effect (meaning this test can be used) for the duration of the COVID-19 declaration under Section 564(b)(1) of the Act, 21 U.S.C.section 360bbb-3(b)(1), unless the authorization is terminated  or revoked  sooner.       Influenza A by PCR NEGATIVE NEGATIVE Final   Influenza B by PCR NEGATIVE NEGATIVE Final    Comment: (NOTE) The Xpert Xpress SARS-CoV-2/FLU/RSV plus assay is intended as an aid in the diagnosis of influenza from Nasopharyngeal swab specimens and should not be used as a sole basis for treatment. Nasal washings and aspirates are unacceptable for Xpert Xpress SARS-CoV-2/FLU/RSV testing.  Fact Sheet for Patients: EntrepreneurPulse.com.au  Fact Sheet for Healthcare Providers: IncredibleEmployment.be  This test is not yet approved or cleared by the [place] FDA and has been authorized for detection and/or diagnosis of SARS-CoV-2 by FDA under an Emergency Use Authorization (EUA). This EUA will remain in effect (meaning this test can be used) for the duration of the COVID-19 declaration under Section 564(b)(1) of the Act, 21 U.S.C. section 360bbb-3(b)(1), unless the authorization is terminated or revoked.  Performed at [facility], [place] [address], [place], [place] [address]      Labs: Basic Metabolic Panel: Recent Labs  Lab 08/28/20 1345  NA 140  K 4.0  CL 103  CO2 27  GLUCOSE 82  BUN 18  CREATININE 1.67*  CALCIUM 9.5   Liver Function Tests: Recent Labs  Lab 08/28/20 1345  AST 15  ALT 16  ALKPHOS 95  BILITOT 0.5  PROT 7.5  ALBUMIN 3.9   CBC: Recent Labs  Lab 08/28/20 1345  WBC 8.0  NEUTROABS 5.8  HGB 13.2  HCT 41.0  MCV 87.2  PLT 246   CBG: Recent Labs  Lab 08/29/20 1740 08/29/20 2104 08/30/20 0700 08/30/20 1210 08/30/20 1705  GLUCAP 112* 214* 112* 162* 107*   Hgb A1c Recent Labs    08/28/20 1607  HGBA1C 6.5*   Lipid Profile Recent Labs    08/28/20 1607  CHOL 121  HDL 31*  LDLCALC 72  TRIG 89  CHOLHDL 3.9   Thyroid function studies No results for input(s): TSH, T4TOTAL, T3FREE, THYROIDAB in the last 72 hours.  Invalid input(s): FREET3 Urinalysis    Component Value  Date/Time   COLORURINE AMBER (A) 07/15/2018 1202   APPEARANCEUR Clear 07/05/2020 0954   LABSPEC 1.026 07/15/2018 1202   PHURINE 5.0 07/15/2018 1202   GLUCOSEU Negative 07/05/2020 0954   HGBUR NEGATIVE 07/15/2018 1202   BILIRUBINUR Negative 07/05/2020 0954   KETONESUR NEGATIVE 07/15/2018 1202   PROTEINUR 1+ (A) 07/05/2020 0954   PROTEINUR 100 (A) 07/15/2018 1202   UROBILINOGEN 0.2 04/08/2019 1118   NITRITE Negative 07/05/2020 0954   NITRITE NEGATIVE 07/15/2018 1202   LEUKOCYTESUR Negative 07/05/2020 0954   LEUKOCYTESUR LARGE (A) 07/15/2018 1202    FURTHER DISCHARGE INSTRUCTIONS:   Get Medicines reviewed and adjusted: Please take all your medications with you for your next visit with your Primary MD   Laboratory/radiological data: Please request your Primary MD to go over all hospital tests and procedure/radiological results at the follow up, please ask your Primary MD to get all Hospital records sent to his/her office.   In some cases, they will be blood work, cultures and biopsy results pending at the time of your discharge. Please request that your primary care M.D. goes through all the records of your hospital data and follows up on these results.   Also Note the following: If you experience worsening of your admission symptoms, develop shortness of breath, life threatening emergency, suicidal or homicidal thoughts you must seek medical attention immediately by calling 911 or calling your MD immediately  if symptoms less severe.   You must read complete instructions/literature along with all the possible adverse reactions/side effects for all the Medicines you take and that have been prescribed to you. Take any new Medicines after you have completely understood and accpet all the possible adverse reactions/side effects.    Do not drive when taking Pain medications or sleeping medications (Benzodaizepines)   Do not take more than prescribed Pain, Sleep and Anxiety Medications. It  is not advisable to combine anxiety,sleep and pain medications without talking with your primary care practitioner   Special Instructions: If you have smoked or chewed Tobacco  in the last 2 yrs please stop smoking, stop any regular Alcohol  and or any Recreational drug use.   Wear Seat belts while driving.   Please note: You were cared for by a hospitalist during your hospital stay. Once you are discharged, your primary care physician will handle any further medical issues. Please note that NO REFILLS for any discharge medications will be authorized once you are discharged, as it is imperative that you return to your primary care physician (or establish a relationship with a primary care physician if you do not have one) for your post hospital discharge needs so that they can reassess your need for medications and monitor your lab values.  Time coordinating discharge: 40 minutes  SIGNED:  Marzetta Board, MD, PhD 08/30/2020, 5:40 PM

## 2020-08-31 ENCOUNTER — Telehealth: Payer: Self-pay

## 2020-08-31 ENCOUNTER — Telehealth: Payer: Self-pay | Admitting: Cardiovascular Disease

## 2020-08-31 ENCOUNTER — Encounter (HOSPITAL_COMMUNITY): Payer: Self-pay | Admitting: Cardiology

## 2020-08-31 NOTE — Telephone Encounter (Signed)
Pt states ILR implant site is wet with blood, it is not gushing just trickling.    Advised patient to reinforce dressing or apply new dressing, hold pressure without releasing for at least 15 minutes.  If still bleeding after that repeat this time holding pressure for 30 minutes.

## 2020-08-31 NOTE — Telephone Encounter (Signed)
Transition Care Management Unsuccessful Follow-up Telephone Call  Date of discharge and from where:  [facility] 08/30/20  Diagnosis: CVA   Attempts:  2nd Attempt  Reason for unsuccessful TCM follow-up call:  Voice mail full  Transition Care Management Unsuccessful Follow-up Telephone Call  Date of discharge and from where:  [facility] 08/30/20  Diagnosis: CVA   Attempts:  3rd Attempt  Reason for unsuccessful TCM follow-up call:  Unable to reach patient Pt's wife driving. Dropped call.

## 2020-08-31 NOTE — Telephone Encounter (Signed)
Transition Care Management Unsuccessful Follow-up Telephone Call  Date of discharge and from where:  [facility]  Diagnosis: CVS   Attempts:  1st Attempt  Reason for unsuccessful TCM follow-up call:  Left voice message

## 2020-08-31 NOTE — Telephone Encounter (Signed)
PT says that bandage is soggy and is bled through, would like to know how long its supposed to bleed and if he can change the dressing... please advise

## 2020-09-05 NOTE — Telephone Encounter (Signed)
Transition Care Management Follow-up Telephone Call Date of discharge and from where: 08/30/20 [facility] Diagnosis:  CVA, Left superior branch retinal artery occlusion How have you been since you were released from the hospital? He is fine - Left eye vision impaired - has f/u with ophthalmology - has to wear sunglasses most of the time Any questions or concerns? No  Items Reviewed: Did the pt receive and understand the discharge instructions provided? Yes  Medications obtained and verified? Yes  Other? No  Any new allergies since your discharge? No  Dietary orders reviewed? Yes Do you have support at home? Yes   Home Care and Equipment/Supplies: Were home health services ordered? no  Were any new equipment or medical supplies ordered?  No  Functional Questionnaire: (I = Independent and D = Dependent) ADLs: I  Bathing/Dressing- I  Meal Prep- I  Eating- I  Maintaining continence- I  Transferring/Ambulation- I  Managing Meds- I  Follow up appointments reviewed:  PCP Hospital f/u appt confirmed? Yes  Scheduled to see [name] on 8/25 @ 12. [facility] f/u appt confirmed? Yes  Scheduled to see cardiology on 8/25 @ 4. Also f/u with ophthalmology in [place] Are transportation arrangements needed? No  If their condition worsens, is the pt aware to call PCP or go to the Emergency Dept.? Yes Was the patient provided with contact information for the PCP's office or ED? Yes Was to pt encouraged to call back with questions or concerns? Yes

## 2020-09-06 ENCOUNTER — Ambulatory Visit: Payer: Medicare PPO | Admitting: Nurse Practitioner

## 2020-09-06 ENCOUNTER — Other Ambulatory Visit: Payer: Self-pay

## 2020-09-06 ENCOUNTER — Encounter: Payer: Self-pay | Admitting: Nurse Practitioner

## 2020-09-06 ENCOUNTER — Ambulatory Visit (INDEPENDENT_AMBULATORY_CARE_PROVIDER_SITE_OTHER): Payer: Medicare PPO

## 2020-09-06 VITALS — BP 131/70 | HR 63 | Temp 97.5°F | Resp 20 | Ht 71.0 in

## 2020-09-06 DIAGNOSIS — I6930 Unspecified sequelae of cerebral infarction: Secondary | ICD-10-CM

## 2020-09-06 DIAGNOSIS — I639 Cerebral infarction, unspecified: Secondary | ICD-10-CM

## 2020-09-06 DIAGNOSIS — Z7689 Persons encountering health services in other specified circumstances: Secondary | ICD-10-CM

## 2020-09-06 NOTE — Progress Notes (Signed)
Subjective:    Patient ID: Jacob Rios, male    DOB: 04-19-1945, 75 y.o.   MRN: [id number]  Today's visit was for Transitional Care Management.  The patient was discharged from [facility] on 08/30/20 with a primary diagnosis of CVA.   Contact with the patient and/or caregiver, by a clinical staff member, was made on 08/31/20 and was documented as a telephone encounter within the EMR.  Through chart review and discussion with the patient I have determined that management of their condition is of high complexity.    He says that only symptom of stroke was loss of vision in  hie left eye. He never had any extremity weakness. They were not able to determine cause of stroke. They inserted a loop recorder to see if he is having bouts of atrial fib. So far recordings have been normal. Since discharge from hospital he has been doing well. The only residual affect he has is trouble with vision left eye. He says he is back to baseline. No c/o fatigue. BP Readings from Last 3 Encounters:  09/06/20 131/70  08/30/20 131/80  08/28/20 (!) 150/79        Review of Systems  Constitutional:  Negative for diaphoresis.  Eyes:  Negative for pain.  Respiratory:  Negative for shortness of breath.   Cardiovascular:  Negative for chest pain, palpitations and leg swelling.  Gastrointestinal:  Negative for abdominal pain.  Endocrine: Negative for polydipsia.  Skin:  Negative for rash.  Neurological:  Negative for dizziness, weakness and headaches.  Hematological:  Does not bruise/bleed easily.  All other systems reviewed and are negative.     Objective:   Physical Exam Vitals and nursing note reviewed.  Constitutional:      Appearance: Normal appearance. He is well-developed.  HENT:     Head: Normocephalic.     Nose: Nose normal.  Eyes:     Pupils: Pupils are equal, round, and reactive to light.  Neck:     Thyroid: No thyroid mass or thyromegaly.     Vascular: No carotid bruit or JVD.      Trachea: Phonation normal.  Cardiovascular:     Rate and Rhythm: Normal rate and regular rhythm.  Pulmonary:     Effort: Pulmonary effort is normal. No respiratory distress.     Breath sounds: Normal breath sounds.  Abdominal:     General: Bowel sounds are normal.     Palpations: Abdomen is soft.     Tenderness: There is no abdominal tenderness.  Musculoskeletal:        General: Normal range of motion.     Cervical back: Normal range of motion and neck supple.  Lymphadenopathy:     Cervical: No cervical adenopathy.  Skin:    General: Skin is warm and dry.  Neurological:     General: No focal deficit present.     Mental Status: He is alert and oriented to person, place, and time.     Cranial Nerves: No cranial nerve deficit.     Sensory: No sensory deficit.     Motor: No weakness.     Coordination: Coordination normal.  Psychiatric:        Behavior: Behavior normal.        Thought Content: Thought content normal.        Judgment: Judgment normal.   BP 131/70   Pulse 63   Temp (!) 97.5 F (36.4 C) (Temporal)   Resp 20   Ht '5\' 11"'$  (  1.803 m)   SpO2 95%   BMI 21.06 kg/m          Assessment & Plan:  Jacob Rios in today with chief complaint of Transitions Of Care   1. Late effect of cerebrovascular accident (CVA) Continue plavix as prescribed To ED if reoccurs  2. Encounter for support and coordination of transition of care Hospital records reviewed     The above assessment and management plan was discussed with the patient. The patient verbalized understanding of and has agreed to the management plan. Patient is aware to call the clinic if symptoms persist or worsen. Patient is aware when to return to the clinic for a follow-up visit. Patient educated on when it is appropriate to go to the emergency department.   [name], FNP

## 2020-09-06 NOTE — Patient Instructions (Signed)
Clopidogrel Tablets What is this medication? CLOPIDOGREL (kloh PID oh grel) lowers the risk of heart attack, stroke, or blood clots. It prevents blood cells (platelets) from clumping together to forma clot. It belongs to a group of medications called antiplatelets. This medicine may be used for other purposes; ask your health care provider orpharmacist if you have questions. COMMON BRAND NAME(S): Plavix What should I tell my care team before I take this medication? They need to know if you have any of the following conditions: Bleeding disorders Bleeding in the brain Having surgery History of stomach bleeding An unusual or allergic reaction to clopidogrel, other medications, foods, dyes, or preservatives Pregnant or trying to get pregnant Breast-feeding How should I use this medication? Take this medication by mouth with a glass of water. Follow the directions on the prescription label. You may take this medication with or without food. If it upsets your stomach, take it with food. Take your medication at regular intervals. Do not take it more often than directed. Do not stop taking excepton your care team's advice. A special MedGuide will be given to you by the pharmacist with eachprescription and refill. Be sure to read this information carefully each time. Talk to your care team about the use of this medication in children. Specialcare may be needed. Overdosage: If you think you have taken too much of this medicine contact apoison control center or emergency room at once. NOTE: This medicine is only for you. Do not share this medicine with others. What if I miss a dose? If you miss a dose, take it as soon as you can. If it is almost time for yournext dose, take only that dose. Do not take double or extra doses. What may interact with this medication? Do not take this medication with the following: Dasabuvir; ombitasvir; paritaprevir; ritonavir Defibrotide Selexipag This medication may  also interact with the following: Certain medications that treat or prevent blood clots like warfarin Narcotic medications for pain NSAIDs, medications for pain and inflammation, like ibuprofen or naproxen Repaglinide SNRIs, medications for depression, like desvenlafaxine, duloxetine, levomilnacipran, venlafaxine SSRIs, medications for depression, like citalopram, escitalopram, fluoxetine, fluvoxamine, paroxetine, sertraline Stomach acid blockers like cimetidine, esomeprazole, omeprazole This list may not describe all possible interactions. Give your health care provider a list of all the medicines, herbs, non-prescription drugs, or dietary supplements you use. Also tell them if you smoke, drink alcohol, or use illegaldrugs. Some items may interact with your medicine. What should I watch for while using this medication? Visit your care team for regular check-ups. Do not stop taking your medicationunless your care team tells you to. Notify your care team and seek emergency services if you develop sudden numbness or weakness of the face, arm, or leg, trouble speaking, confusion, trouble walking, loss of balance or coordination, dizziness, severe headache,or change in vision. These can be signs that your condition has gotten worse. If you are going to have surgery or dental work, tell your care team that youare taking this medication. Certain genetic factors may reduce the effect of this medication. Your careteam may use genetic tests to determine treatment. Only take aspirin if you are instructed to. Low doses of aspirin are used with this medication to treat some conditions. Taking aspirin with this medication can increase your risk of bleeding, so you must be careful. Talk to your careteam if you have questions. What side effects may I notice from receiving this medication? Side effects that you should report to your care team  as soon as possible: Allergic reactions-skin rash, itching, hives,  swelling of the face, lips, tongue, or throat Bleeding-bloody or black, tar-like stools, red or dark brown urine, vomiting blood or brown material that looks like coffee grounds, small, red or purple spots on the skin, unusual bleeding or bruising TTP-purple spots on the skin or inside the mouth, pale skin, yellowing skin or eyes, unusual weakness or fatigue, fever, fast or irregular heartbeat, confusion, change in vision, trouble speaking, trouble walking Side effects that usually do not require medical attention (report to your careteam if they continue or are bothersome): Diarrhea Headache This list may not describe all possible side effects. Call your doctor for medical advice about side effects. You may report side effects to FDA at1-800-FDA-1088. Where should I keep my medication? Keep out of the reach of children and pets. Store at room temperature of 59 to 86 degrees F (15 to 30 degrees C). Throwaway any unused medication after the expiration date. NOTE: This sheet is a summary. It may not cover all possible information. If you have questions about this medicine, talk to your doctor, pharmacist, orhealth care provider.  2022 Elsevier/Gold Standard (2019-12-02 15:33:52)

## 2020-09-06 NOTE — Progress Notes (Signed)
ILR wound check in clinic. Steri strips removed by patient prior to appointment. Presented to appointment with bandaid covering wound secondary to reported bleeding until this am. No evidence of bleeding at this time. Left wound open to air. Mild bruising noted. Patient denies complaints. Wound well healed. Home monitor transmitting nightly. No episodes. Questions answered.

## 2020-09-20 ENCOUNTER — Encounter (INDEPENDENT_AMBULATORY_CARE_PROVIDER_SITE_OTHER): Payer: Medicare PPO | Admitting: Ophthalmology

## 2020-09-20 NOTE — Progress Notes (Signed)
[facility] Note  09/21/2020     CHIEF COMPLAINT Patient presents for Retina Follow Up   HISTORY OF PRESENT ILLNESS: Jacob Rios is a 75 y.o. male who presents to the clinic today for:   HPI     Retina Follow Up   Patient presents with  CRVO/BRVO.  In left eye.  Duration of 3 weeks.  Since onset it is gradually improving.  I, the attending physician,  performed the HPI with the patient and updated documentation appropriately.        Comments   3 1/2 week follow up BRAO OS- Some improvement this past week OS.  Instead of seeing black out of it, it seems gray now.  Still taking Plavix. OD doing well.   Both eyes feel really dry.  He has Refresh but didn't know if he should be using it or how often.   BS 111 yesterday A1C 6.7      Last edited by Bernarda Caffey, MD on 09/22/2020  1:25 AM.     Pt states after his last visit here, he went over to [facility] ED and was dx with a stroke and put on Plavix, he feels like over the last week, his vision has gotten a lot better  Referring physician: [name], [place],  [place] [address]  HISTORICAL INFORMATION:   Selected notes from the [place] Referred by Dr. Marin Comment for BRAO OS LEE:  Ocular Hx- PMH-    CURRENT MEDICATIONS: No current outpatient medications on file. (Ophthalmic Drugs)   No current facility-administered medications for this visit. (Ophthalmic Drugs)   Current Outpatient Medications (Other)  Medication Sig   atorvastatin (LIPITOR) 40 MG tablet Take 1 tablet (40 mg total) by mouth daily.   Cholecalciferol (D3 ADULT PO) Take 1 tablet by mouth daily.   clopidogrel (PLAVIX) 75 MG tablet Take 1 tablet (75 mg total) by mouth daily.   CRANBERRY PO Take 1 capsule by mouth daily.   glipiZIDE (GLUCOTROL XL) 10 MG 24 hr tablet Take 1 tablet (10 mg total) by mouth daily.   lisinopril (ZESTRIL) 2.5 MG tablet Take 1 tablet (2.5 mg total) by mouth daily.    metFORMIN (GLUCOPHAGE) 1000 MG tablet Take 1 tablet (1,000 mg total) by mouth 2 (two) times daily with a meal.   metoprolol succinate (TOPROL-XL) 50 MG 24 hr tablet Take 1 tablet (50 mg total) by mouth daily. Take with or immediately following a meal.   omeprazole (PRILOSEC) 40 MG capsule Take 1 capsule (40 mg total) by mouth daily.   vitamin C (ASCORBIC ACID) 500 MG tablet Take 500 mg by mouth daily.   glucose blood (ONETOUCH VERIO) test strip Test 1X per day and as needed  Dx 250.02   Lancets (ONETOUCH ULTRASOFT) lancets Patient test 1X per day and prn  Dx 250.02   No current facility-administered medications for this visit. (Other)   REVIEW OF SYSTEMS: ROS   Positive for: Endocrine, Cardiovascular, Eyes Negative for: Constitutional, Gastrointestinal, Neurological, Skin, Genitourinary, Musculoskeletal, HENT, Respiratory, Psychiatric, Allergic/Imm, Heme/Lymph Last edited by Leonie Douglas, COA on 09/21/2020  7:50 AM.     ALLERGIES Allergies  Allergen Reactions   Morphine Shortness Of Breath and Swelling    PAST MEDICAL HISTORY Past Medical History:  Diagnosis Date   CKD (chronic kidney disease), stage III ([place])    followed by pcp   Coronary artery disease cardiologist--- dr Angelena Form   08/ 1999  s/p  cath w/ PTCA and stenting to RCA;   05/ 2000 inferior wall MI , 06-06-1998 s/p cabg x3;   Last heart cath 2005,  2 patent grafts (diagnol and RCA) and occluded LIMA--LAD graft with normal LAD nonobstructive disease;  last nuclear study 11/ 2014 no evidence ishcemia, ef 40%   Full dentures    Hiatal hernia    History of acute inferior wall MI 05/1998   s/p  cabg   Hypertension    Mixed hyperlipidemia    Nocturia more than twice per night    PAD (peripheral artery disease) (HCC)    left common iliac artery stenosis per aorta ultrasound 03/ 2021 in epic and cardiology note   Prostate cancer [facility]) urologist--- dr Jeffie Pollock   first dx 07/ 2015 in active survillance until bx 11-11-2019,  Stage  T2a, Gleason 3+4, PSA 13.3   S/P CABG x 3 06/06/1998   LIMA--LAD, SVG to Diagonal, RIMA to RCA   S/P primary angioplasty with coronary stent 08/1997   stent to RCA   Type 2 diabetes mellitus ([place])    followed by pcp  (03-06-2020 checks blood sugar dialy in am,  fasting sugar-- 110-130)   Past Surgical History:  Procedure Laterality Date   APPENDECTOMY  child   CARDIAC CATHETERIZATION  09/01/2000  _0    patent RCA and Diagonal grafts, atretic LIMA, moderate nonobstructive proxLAD, normal lvsf   CARDIAC CATHETERIZATION  04-21-2003  _1    LIMA--LAD graft occluded, native LAD with nonobstructive disease,  patent diagonal/ rca grafts   CATARACT EXTRACTION     CATARACT EXTRACTION W/ INTRAOCULAR LENS IMPLANT Right 12/2018   CORONARY ANGIOPLASTY WITH STENT PLACEMENT  08/1997  _2    ptca w/ stenting to rca   CORONARY ARTERY BYPASS GRAFT  06-06-1998  _3     LIMA -- LAD, SVG -- Diagonal,  RIMA to RCA   CYSTOSCOPY N/A 03/09/2020   Procedure: CYSTOSCOPY FLEXIBLE;  Surgeon: [name], MD;  Location: [facility];  Service: Urology;  Laterality: N/A;  NO SEEDS FOUND IN BLADDER   INGUINAL HERNIA REPAIR Right 1974   LOOP RECORDER INSERTION N/A 08/30/2020   Procedure: LOOP RECORDER INSERTION;  Surgeon: Vickie Epley, MD;  Location: [place] [facility];  Service: Cardiovascular;  Laterality: N/A;   PROSTATE BIOPSY  06/2017   x 4-5    RADIOACTIVE SEED IMPLANT N/A 03/09/2020   Procedure: RADIOACTIVE SEED IMPLANT/BRACHYTHERAPY IMPLANT;  Surgeon: [name], MD;  Location: [facility];  Service: Urology;  Laterality: N/A;   70  SEEDS IMPLANTED   SPACE OAR INSTILLATION N/A 03/09/2020   Procedure: SPACE OAR INSTILLATION;  Surgeon: [name], MD;  Location: [facility];  Service: Urology;  Laterality: N/A;   VENTRAL HERNIA REPAIR  09/ 2000 and recurrent repair 06/ 2001    FAMILY HISTORY Family History  Problem Relation Age of Onset   Lung cancer Mother     Dementia Father    Hyperlipidemia Father    Congestive Heart Failure Father    Post-traumatic stress disorder Son    Alcohol abuse Maternal Grandfather    Cancer Maternal Grandfather 90       stomach cancer    Heart attack Neg Hx    Stroke Neg Hx    Breast cancer Neg Hx    Colon cancer Neg Hx    Prostate cancer Neg Hx    Pancreatic cancer Neg Hx     SOCIAL HISTORY Social History   Tobacco Use  Smoking status: Former    Packs/day: 4.00    Years: 45.00    Pack years: 180.00    Types: Cigarettes    Quit date: 01/13/1997    Years since quitting: 23.7   Smokeless tobacco: Never   Tobacco comments:    reports he began smoking at age 27  Vaping Use   Vaping Use: Never used  Substance Use Topics   Alcohol use: No   Drug use: Never     There were no vitals filed for this visit.     OPHTHALMIC EXAM:  Base Eye Exam     Visual Acuity (Snellen - Linear)       Right Left   Dist [place] 20/40 CF 5'   Dist ph [place] 20/30 20/40-  OS- only able to see last 3 letters of each line with PH. Could only see [place] letters with light on.        Tonometry (Tonopen, 8:03 AM)       Right Left   Pressure 16 10         Pupils       Dark Light Shape React APD   Right 4 3 Round Brisk None   Left 4 3 Round Brisk None  OS- hippus vs APD.  After a few times, appears to be more hippus.        Visual Fields (Counting fingers)       Left Right    Full Full         Extraocular Movement       Right Left    Full Full         Neuro/Psych     Oriented x3: Yes   Mood/Affect: Normal         Dilation     Both eyes: 1.0% Mydriacyl, 2.5% Phenylephrine @ 8:05 AM           Slit Lamp and Fundus Exam     Slit Lamp Exam       Right Left   Lids/Lashes Dermatochalasis - upper lid Dermatochalasis - upper lid   Conjunctiva/Sclera nasal and temporal pinguecula nasal and temporal pinguecula   Cornea 1+ Punctate epithelial erosions 1+ Punctate epithelial erosions, well  healed cataract wound   Anterior Chamber deep, clear, narrow temporal angle deep, clear, narrow temporal angle   Iris Round and dilated, mild PPM inferiorly Round and dilated, mild PPM inferiorly   Lens 2-3+ Nuclear sclerosis, 2-3+ Cortical cataract, trace Posterior subcapsular cataract PC IOL in good position   Vitreous Vitreous syneresis Vitreous syneresis         Fundus Exam       Right Left   Disc Pink and Sharp mild Pallor, Sharp rim, temporal PPP   C/D Ratio 0.5 0.6   Macula Flat, Blunted foveal reflex, No heme or edema Flat, Blunted foveal reflex, mild retinal whitening superior macula and fovea - improving, no heme   Vessels attenuated, Tortuous attenuated, Tortuous, no emboli    Periphery Attached, No heme  Attached, few DBH superior mac            IMAGING AND PROCEDURES  Imaging and Procedures for 09/21/2020  OCT, Retina - OU - Both Eyes       Right Eye Quality was good. Central Foveal Thickness: 269. Progression has been stable. Findings include normal foveal contour, no IRF, no SRF, vitreomacular adhesion .   Left Eye Quality was good. Central Foveal Thickness: [id number]. Progression has improved. Findings include normal  foveal contour, no IRF, no SRF, intraretinal hyper-reflective material, vitreomacular adhesion , inner retinal atrophy (persistent inner retinal hyper reflectivity superior macula, progression of inner retinal atrophy superior macula, partial PVD).   Notes *Images captured and stored on drive  Diagnosis / Impression:  OD: NFP, no IRF/SRF OS: BRAO affecting superior macula and fovea -- persistent inner retinal hyper reflectivity superior macula, progression of inner retinal atrophy superior macula  Clinical management:  See below  Abbreviations: NFP - Normal foveal profile. CME - cystoid macular edema. PED - pigment epithelial detachment. IRF - intraretinal fluid. SRF - subretinal fluid. EZ - ellipsoid zone. ERM - epiretinal membrane. ORA - outer  retinal atrophy. ORT - outer retinal tubulation. SRHM - subretinal hyper-reflective material. IRHM - intraretinal hyper-reflective material            ASSESSMENT/PLAN:    ICD-10-CM   1. BRAO (branch retinal artery occlusion), left  H34.232     2. Retinal edema  H35.81 OCT, Retina - OU - Both Eyes    3. Essential hypertension  I10     4. Hypertensive retinopathy of both eyes  H35.033     5. Combined forms of age-related cataract of right eye  H25.811     6. Pseudophakia  Z96.1        1,2. BRAO OS - pt reports history of painless inferior visual field loss OS - exam shows mild retinal whitening of superior macula and fovea; no emboli noted - BCVA 20/40 (PH), IOP 10 - OCT shows BRAO effecting superior macula and fovea - FA (08.16.22) shows severely delayed filling time, but eventual perfusion of all retinal vessels OS - ROS for GCA negative -- no headache, jaw claudication, scalp tenderness, numbness/tingling - pt was sent to ED for stroke work up on 8.16.22 -- admitted and MRI showed L parietal stroke -- started on plavix - exam today shows mild improvement in superior macular whitening - no retinal or ophthalmic intervention indicated or recommended at this time - F/U 2 months -- DFE/OCT  3,4. Hypertensive retinopathy OU - discussed importance of tight BP control - monitor   5. Mixed Cataract OD - The symptoms of cataract, surgical options, and treatments and risks were discussed with patient. - discussed diagnosis and progression - not yet visually significant - monitor for now  6. Pseudophakia OS  - s/p CE/IOL (Dr. Manuella Ghazi, [facility], Dec. 2020)  - IOL in good position, doing well  - monitor    Ophthalmic Meds Ordered this visit:  No orders of the defined types were placed in this encounter.      Return in about 2 months (around 11/21/2020) for f/u BRAO OS, DFE, OCT.  There are no Patient Instructions on file for this visit.   Explained the diagnoses, plan, and  follow up with the patient and they expressed understanding.  Patient expressed understanding of the importance of proper follow up care.   This document serves as a record of services personally performed by Gardiner Sleeper, MD, PhD. It was created on their behalf by Leonie Douglas, an ophthalmic technician. The creation of this record is the provider's dictation and/or activities during the visit.    Electronically signed by: Leonie Douglas COA, 09/22/20  1:27 AM  This document serves as a record of services personally performed by Gardiner Sleeper, MD, PhD. It was created on their behalf by San Jetty. Owens Shark, OA an ophthalmic technician. The creation of this record is the provider's dictation and/or activities during the  visit.    Electronically signed by: San Jetty. Owens Shark, [place] 09.09.2022 1:27 AM  Gardiner Sleeper, M.D., Ph.D. Diseases & Surgery of the Retina and [place] 09/21/2020  I have reviewed the above documentation for accuracy and completeness, and I agree with the above. Gardiner Sleeper, M.D., Ph.D. 09/22/20 1:31 AM  Abbreviations: M myopia (nearsighted); A astigmatism; H hyperopia (farsighted); P presbyopia; Mrx spectacle prescription;  CTL contact lenses; OD right eye; OS left eye; OU both eyes  XT exotropia; ET esotropia; PEK punctate epithelial keratitis; PEE punctate epithelial erosions; DES dry eye syndrome; MGD meibomian gland dysfunction; ATs artificial tears; PFAT's preservative free artificial tears; [place] nuclear sclerotic cataract; PSC posterior subcapsular cataract; ERM epi-retinal membrane; PVD posterior vitreous detachment; RD retinal detachment; DM diabetes mellitus; DR diabetic retinopathy; NPDR non-proliferative diabetic retinopathy; PDR proliferative diabetic retinopathy; CSME clinically significant macular edema; DME diabetic macular edema; dbh dot blot hemorrhages; CWS cotton wool spot; POAG primary open angle glaucoma; C/D cup-to-disc ratio;  HVF humphrey visual field; GVF goldmann visual field; OCT optical coherence tomography; IOP intraocular pressure; BRVO Branch retinal vein occlusion; CRVO central retinal vein occlusion; CRAO central retinal artery occlusion; BRAO branch retinal artery occlusion; RT retinal tear; SB scleral buckle; PPV pars plana vitrectomy; VH Vitreous hemorrhage; PRP panretinal laser photocoagulation; IVK intravitreal kenalog; VMT vitreomacular traction; MH Macular hole;  NVD neovascularization of the disc; NVE neovascularization elsewhere; AREDS age related eye disease study; ARMD age related macular degeneration; POAG primary open angle glaucoma; EBMD epithelial/anterior basement membrane dystrophy; ACIOL anterior chamber intraocular lens; IOL intraocular lens; PCIOL posterior chamber intraocular lens; Phaco/IOL phacoemulsification with intraocular lens placement; [place] photorefractive keratectomy; LASIK laser assisted in situ keratomileusis; HTN hypertension; DM diabetes mellitus; COPD chronic obstructive pulmonary disease

## 2020-09-21 ENCOUNTER — Ambulatory Visit (INDEPENDENT_AMBULATORY_CARE_PROVIDER_SITE_OTHER): Payer: Medicare PPO | Admitting: Ophthalmology

## 2020-09-21 ENCOUNTER — Other Ambulatory Visit: Payer: Self-pay

## 2020-09-21 DIAGNOSIS — H25811 Combined forms of age-related cataract, right eye: Secondary | ICD-10-CM | POA: Diagnosis not present

## 2020-09-21 DIAGNOSIS — H34232 Retinal artery branch occlusion, left eye: Secondary | ICD-10-CM | POA: Diagnosis not present

## 2020-09-21 DIAGNOSIS — H35033 Hypertensive retinopathy, bilateral: Secondary | ICD-10-CM

## 2020-09-21 DIAGNOSIS — H3581 Retinal edema: Secondary | ICD-10-CM

## 2020-09-21 DIAGNOSIS — Z961 Presence of intraocular lens: Secondary | ICD-10-CM

## 2020-09-21 DIAGNOSIS — I10 Essential (primary) hypertension: Secondary | ICD-10-CM

## 2020-09-22 ENCOUNTER — Encounter (INDEPENDENT_AMBULATORY_CARE_PROVIDER_SITE_OTHER): Payer: Self-pay | Admitting: Ophthalmology

## 2020-09-28 ENCOUNTER — Ambulatory Visit (INDEPENDENT_AMBULATORY_CARE_PROVIDER_SITE_OTHER): Payer: Medicare PPO | Admitting: Nurse Practitioner

## 2020-09-28 ENCOUNTER — Other Ambulatory Visit: Payer: Self-pay

## 2020-09-28 ENCOUNTER — Encounter: Payer: Self-pay | Admitting: Nurse Practitioner

## 2020-09-28 VITALS — BP 117/67 | HR 61 | Temp 97.7°F | Resp 20 | Ht 71.0 in | Wt 227.0 lb

## 2020-09-28 DIAGNOSIS — H6122 Impacted cerumen, left ear: Secondary | ICD-10-CM

## 2020-09-28 NOTE — Patient Instructions (Signed)
Earwax Buildup, Adult ?The ears produce a substance called earwax that helps keep bacteria out of the ear and protects the skin in the ear canal. Occasionally, earwax can build up in the ear and cause discomfort or hearing loss. ?What are the causes? ?This condition is caused by a buildup of earwax. Ear canals are self-cleaning. Ear wax is made in the outer part of the ear canal and generally falls out in small amounts over time. ?When the self-cleaning mechanism is not working, earwax builds up and can cause decreased hearing and discomfort. Attempting to clean ears with cotton swabs can push the earwax deep into the ear canal and cause decreased hearing and pain. ?What increases the risk? ?This condition is more likely to develop in people who: ?Clean their ears often with cotton swabs. ?Pick at their ears. ?Use earplugs or in-ear headphones often, or wear hearing aids. ?The following factors may also make you more likely to develop this condition: ?Being male. ?Being of older age. ?Naturally producing more earwax. ?Having narrow ear canals. ?Having earwax that is overly thick or sticky. ?Having excess hair in the ear canal. ?Having eczema. ?Being dehydrated. ?What are the signs or symptoms? ?Symptoms of this condition include: ?Reduced or muffled hearing. ?A feeling of fullness in the ear or feeling that the ear is plugged. ?Fluid coming from the ear. ?Ear pain or an itchy ear. ?Ringing in the ear. ?Coughing. ?Balance problems. ?An obvious piece of earwax that can be seen inside the ear canal. ?How is this diagnosed? ?This condition may be diagnosed based on: ?Your symptoms. ?Your medical history. ?An ear exam. During the exam, your health care provider will look into your ear with an instrument called an otoscope. ?You may have tests, including a hearing test. ?How is this treated? ?This condition may be treated by: ?Using ear drops to soften the earwax. ?Having the earwax removed by a health care provider. The  health care provider may: ?Flush the ear with water. ?Use an instrument that has a loop on the end (curette). ?Use a suction device. ?Having surgery to remove the wax buildup. This may be done in severe cases. ?Follow these instructions at home: ? ?Take over-the-counter and prescription medicines only as told by your health care provider. ?Do not put any objects, including cotton swabs, into your ear. You can clean the opening of your ear canal with a washcloth or facial tissue. ?Follow instructions from your health care provider about cleaning your ears. Do not overclean your ears. ?Drink enough fluid to keep your urine pale yellow. This will help to thin the earwax. ?Keep all follow-up visits as told. If earwax builds up in your ears often or if you use hearing aids, consider seeing your health care provider for routine, preventive ear cleanings. Ask your health care provider how often you should schedule your cleanings. ?If you have hearing aids, clean them according to instructions from the manufacturer and your health care provider. ?Contact a health care provider if: ?You have ear pain. ?You develop a fever. ?You have pus or other fluid coming from your ear. ?You have hearing loss. ?You have ringing in your ears that does not go away. ?You feel like the room is spinning (vertigo). ?Your symptoms do not improve with treatment. ?Get help right away if: ?You have bleeding from the affected ear. ?You have severe ear pain. ?Summary ?Earwax can build up in the ear and cause discomfort or hearing loss. ?The most common symptoms of this condition include   reduced or muffled hearing, a feeling of fullness in the ear, or feeling that the ear is plugged. ?This condition may be diagnosed based on your symptoms, your medical history, and an ear exam. ?This condition may be treated by using ear drops to soften the earwax or by having the earwax removed by a health care provider. ?Do not put any objects, including cotton  swabs, into your ear. You can clean the opening of your ear canal with a washcloth or facial tissue. ?This information is not intended to replace advice given to you by your health care provider. Make sure you discuss any questions you have with your health care provider. ?Document Revised: 04/19/2019 Document Reviewed: 04/19/2019 ?Elsevier Patient Education ? 2022 Elsevier Inc[employer] ? ?

## 2020-09-28 NOTE — Progress Notes (Signed)
   Subjective:    Patient ID: Jacob Rios, male    DOB: [birth year], 75 y.o.   MRN: [id number]  Chief Complaint: Ear Pain (Left ear)   HPI Patient comes in today c/o bil ear pain. Started a couple of days ago. Pain is intermittent. No problems hearing. No drainage.    Review of Systems  Constitutional:  Negative for diaphoresis.  Eyes:  Negative for pain.  Respiratory:  Negative for shortness of breath.   Cardiovascular:  Negative for chest pain, palpitations and leg swelling.  Gastrointestinal:  Negative for abdominal pain.  Endocrine: Negative for polydipsia.  Skin:  Negative for rash.  Neurological:  Negative for dizziness, weakness and headaches.  Hematological:  Does not bruise/bleed easily.  All other systems reviewed and are negative.     Objective:   Physical Exam Vitals and nursing note reviewed.  Constitutional:      Appearance: Normal appearance. He is obese.  HENT:     Right Ear: There is no impacted cerumen.     Left Ear: There is impacted cerumen.  Cardiovascular:     Rate and Rhythm: Normal rate and regular rhythm.     Heart sounds: Normal heart sounds.  Pulmonary:     Effort: Pulmonary effort is normal.     Breath sounds: Normal breath sounds.  Skin:    General: Skin is warm.  Neurological:     General: No focal deficit present.     Mental Status: He is alert and oriented to person, place, and time.    BP 117/67   Pulse 61   Temp 97.7 F (36.5 C) (Temporal)   Resp 20   Ht '5\' 11"'$  (1.803 m)   Wt 227 lb (103 kg)   SpO2 98%   BMI 31.66 kg/m   Left ear lavage      Assessment & Plan:  [name] in today with chief complaint of Ear Pain (Left ear)   1. Impacted cerumen of left ear [place] a couple of times a week    The above assessment and management plan was discussed with the patient. The patient verbalized understanding of and has agreed to the management plan. Patient is aware to call the clinic if symptoms persist or worsen.  Patient is aware when to return to the clinic for a follow-up visit. Patient educated on when it is appropriate to go to the emergency department.   [name], FNP

## 2020-10-08 ENCOUNTER — Ambulatory Visit (INDEPENDENT_AMBULATORY_CARE_PROVIDER_SITE_OTHER): Payer: Medicare PPO

## 2020-10-08 DIAGNOSIS — I639 Cerebral infarction, unspecified: Secondary | ICD-10-CM | POA: Diagnosis not present

## 2020-10-08 LAB — CUP PACEART REMOTE DEVICE CHECK
Date Time Interrogation Session: 20220924113334
Implantable Pulse Generator Implant Date: 20220818

## 2020-10-15 NOTE — Progress Notes (Signed)
[facility] Summary Report / Loop Recorder 

## 2020-10-24 ENCOUNTER — Other Ambulatory Visit: Payer: Self-pay

## 2020-10-24 ENCOUNTER — Emergency Department (HOSPITAL_COMMUNITY)
Admission: EM | Admit: 2020-10-24 | Discharge: 2020-10-24 | Disposition: A | Discharge status: Home / Self Care | Payer: Medicare PPO | Attending: Emergency Medicine | Admitting: Emergency Medicine

## 2020-10-24 ENCOUNTER — Encounter (HOSPITAL_COMMUNITY): Payer: Self-pay | Admitting: *Deleted

## 2020-10-24 DIAGNOSIS — Z87891 Personal history of nicotine dependence: Secondary | ICD-10-CM | POA: Diagnosis not present

## 2020-10-24 DIAGNOSIS — E1122 Type 2 diabetes mellitus with diabetic chronic kidney disease: Secondary | ICD-10-CM | POA: Diagnosis not present

## 2020-10-24 DIAGNOSIS — S0012XA Contusion of left eyelid and periocular area, initial encounter: Secondary | ICD-10-CM | POA: Diagnosis not present

## 2020-10-24 DIAGNOSIS — I129 Hypertensive chronic kidney disease with stage 1 through stage 4 chronic kidney disease, or unspecified chronic kidney disease: Secondary | ICD-10-CM | POA: Insufficient documentation

## 2020-10-24 DIAGNOSIS — Z79899 Other long term (current) drug therapy: Secondary | ICD-10-CM | POA: Insufficient documentation

## 2020-10-24 DIAGNOSIS — Z7984 Long term (current) use of oral hypoglycemic drugs: Secondary | ICD-10-CM | POA: Insufficient documentation

## 2020-10-24 DIAGNOSIS — S0083XA Contusion of other part of head, initial encounter: Secondary | ICD-10-CM | POA: Diagnosis not present

## 2020-10-24 DIAGNOSIS — I2510 Atherosclerotic heart disease of native coronary artery without angina pectoris: Secondary | ICD-10-CM | POA: Insufficient documentation

## 2020-10-24 DIAGNOSIS — N183 Chronic kidney disease, stage 3 unspecified: Secondary | ICD-10-CM | POA: Diagnosis not present

## 2020-10-24 DIAGNOSIS — X58XXXA Exposure to other specified factors, initial encounter: Secondary | ICD-10-CM | POA: Insufficient documentation

## 2020-10-24 DIAGNOSIS — S0993XA Unspecified injury of face, initial encounter: Secondary | ICD-10-CM | POA: Diagnosis present

## 2020-10-24 NOTE — ED Provider Notes (Signed)
[facility] EMERGENCY DEPARTMENT Provider Note   CSN: [id number] Arrival date & time: 10/24/20  1805     History Chief Complaint  Patient presents with   Eye Problem    CARNEL STEGMAN is a 75 y.o. male.  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  He noticed it just earlier today.  Denies any pain or discomfort denies any change in vision.  No numbness or weakness no fever no cough no vomiting or diarrhea reported.  No eye trauma that the patient can recall.      Past Medical History:  Diagnosis Date   CKD (chronic kidney disease), stage III ([place])    followed by pcp   Coronary artery disease cardiologist--- dr Angelena Form   08/ 1999  s/p  cath w/ PTCA and stenting to RCA;   05/ 2000 inferior wall MI , 06-06-1998 s/p cabg x3;   Last heart cath 2005,  2 patent grafts (diagnol and RCA) and occluded LIMA--LAD graft with normal LAD nonobstructive disease;  last nuclear study 11/ 2014 no evidence ishcemia, ef 40%   Full dentures    Hiatal hernia    History of acute inferior wall MI 05/1998   s/p  cabg   Hypertension    Mixed hyperlipidemia    Nocturia more than twice per night    PAD (peripheral artery disease) (HCC)    left common iliac artery stenosis per aorta ultrasound 03/ 2021 in epic and cardiology note   Prostate cancer [facility]) urologist--- dr Jeffie Pollock   first dx 07/ 2015 in active survillance until bx 11-11-2019,  Stage T2a, Gleason 3+4, PSA 13.3   S/P CABG x 3 06/06/1998   LIMA--LAD, SVG to Diagonal, RIMA to RCA   S/P primary angioplasty with coronary stent 08/1997   stent to RCA   Type 2 diabetes mellitus ([place])    followed by pcp  (03-06-2020 checks blood sugar dialy in am,  fasting sugar-- 110-130)    Patient Active Problem List   Diagnosis Date Noted   Retinal artery occlusion, branch 08/28/2020   Malignant neoplasm of prostate ([place]) 12/20/2019   CKD stage 3 due to type 2 diabetes mellitus ([place])  08/18/2016   Gastroesophageal reflux disease without esophagitis 02/19/2015   Hyperlipemia 07/20/2014   BMI 35.0-35.9,adult 07/20/2014   Diabetes mellitus ([place]) 10/21/2010   Essential hypertension 10/26/2008   CAD, AUTOLOGOUS BYPASS GRAFT 10/26/2008    Past Surgical History:  Procedure Laterality Date   APPENDECTOMY  child   CARDIAC CATHETERIZATION  09/01/2000  @[facility]    patent RCA and Diagonal grafts, atretic LIMA, moderate nonobstructive proxLAD, normal lvsf   CARDIAC CATHETERIZATION  04-21-2003  @[facility]    LIMA--LAD graft occluded, native LAD with nonobstructive disease,  patent diagonal/ rca grafts   CATARACT EXTRACTION     CATARACT EXTRACTION W/ INTRAOCULAR LENS IMPLANT Right 12/2018   CORONARY ANGIOPLASTY WITH STENT PLACEMENT  08/1997  @[facility]    ptca w/ stenting to rca   CORONARY ARTERY BYPASS GRAFT  06-06-1998  @[facility]     LIMA -- LAD, SVG -- Diagonal,  RIMA to RCA   CYSTOSCOPY N/A 03/09/2020   Procedure: CYSTOSCOPY FLEXIBLE;  Surgeon: [name], MD;  Location: [facility];  Service: Urology;  Laterality: N/A;  NO SEEDS FOUND IN BLADDER   INGUINAL HERNIA REPAIR Right 1974   LOOP RECORDER INSERTION N/A 08/30/2020   Procedure: LOOP RECORDER INSERTION;  Surgeon: Vickie Epley, MD;  Location: [place] CV LAB;  Service: Cardiovascular;  Laterality: N/A;   PROSTATE BIOPSY  06/2017   x 4-5    RADIOACTIVE SEED IMPLANT N/A 03/09/2020   Procedure: RADIOACTIVE SEED IMPLANT/BRACHYTHERAPY IMPLANT;  Surgeon: [name], MD;  Location: [facility];  Service: Urology;  Laterality: N/A;   70  SEEDS IMPLANTED   SPACE OAR INSTILLATION N/A 03/09/2020   Procedure: SPACE OAR INSTILLATION;  Surgeon: [name], MD;  Location: [facility];  Service: Urology;  Laterality: N/A;   VENTRAL HERNIA REPAIR  09/ 2000 and recurrent repair 06/ 2001       Family History  Problem Relation Age of Onset   Lung cancer Mother    Dementia Father    Hyperlipidemia Father     Congestive Heart Failure Father    Post-traumatic stress disorder Son    Alcohol abuse Maternal Grandfather    Cancer Maternal Grandfather 90       stomach cancer    Heart attack Neg Hx    Stroke Neg Hx    Breast cancer Neg Hx    Colon cancer Neg Hx    Prostate cancer Neg Hx    Pancreatic cancer Neg Hx     Social History   Tobacco Use   Smoking status: Former    Packs/day: 4.00    Years: 45.00    Pack years: 180.00    Types: Cigarettes    Quit date: 01/13/1997    Years since quitting: 23.7   Smokeless tobacco: Never   Tobacco comments:    reports he began smoking at age 55  Vaping Use   Vaping Use: Never used  Substance Use Topics   Alcohol use: No   Drug use: Never    Home Medications Prior to Admission medications   Medication Sig Start Date End Date Taking? Authorizing Provider  atorvastatin (LIPITOR) 40 MG tablet Take 1 tablet (40 mg total) by mouth daily. 07/20/20   Hassell Done, [name], FNP  Cholecalciferol (D3 ADULT PO) Take 1 tablet by mouth daily.    [provider]  clopidogrel (PLAVIX) 75 MG tablet Take 1 tablet (75 mg total) by mouth daily. 08/31/20   Caren Griffins, MD  CRANBERRY PO Take 1 capsule by mouth daily.    [provider]  glipiZIDE (GLUCOTROL XL) 10 MG 24 hr tablet Take 1 tablet (10 mg total) by mouth daily. 07/20/20   Hassell Done [name], FNP  glucose blood (ONETOUCH VERIO) test strip Test 1X per day and as needed  Dx 250.02 07/01/13   [name], FNP  Lancets [facility] ULTRASOFT) lancets Patient test 1X per day and prn  Dx 250.02 09/01/12   Hassell Done, [name], FNP  lisinopril (ZESTRIL) 2.5 MG tablet Take 1 tablet (2.5 mg total) by mouth daily. 07/20/20   Hassell Done [name], FNP  metFORMIN (GLUCOPHAGE) 1000 MG tablet Take 1 tablet (1,000 mg total) by mouth 2 (two) times daily with a meal. 07/20/20   Hassell Done, [name], FNP  metoprolol succinate (TOPROL-XL) 50 MG 24 hr tablet Take 1 tablet (50 mg total) by mouth  daily. Take with or immediately following a meal. 07/20/20   Hassell Done, [name], FNP  omeprazole (PRILOSEC) 40 MG capsule Take 1 capsule (40 mg total) by mouth daily. 07/20/20   Hassell Done [name], FNP  vitamin C (ASCORBIC ACID) 500 MG tablet Take 500 mg by mouth daily.    [provider]    Allergies    Morphine  Review of Systems   Review of  Systems  Constitutional:  Negative for fever.  HENT:  Negative for ear pain and sore throat.   Eyes:  Negative for pain.  Respiratory:  Negative for cough.   Cardiovascular:  Negative for chest pain.  Gastrointestinal:  Negative for abdominal pain.  Genitourinary:  Negative for flank pain.  Musculoskeletal:  Negative for back pain.  Skin:  Negative for color change and rash.  Neurological:  Negative for syncope.  All other systems reviewed and are negative.  Physical Exam Updated Vital Signs BP 130/65 (BP Location: Right Arm)   Pulse 70   Temp (!) 97.5 F (36.4 C) (Oral)   Resp 16   Ht 5\' 11"  (1.803 m)   Wt 106.6 kg   SpO2 96%   BMI 32.78 kg/m   Physical Exam Constitutional:      Appearance: He is well-developed.  HENT:     Head: Normocephalic.     Nose: Nose normal.  Eyes:     Extraocular Movements: Extraocular movements intact.     Conjunctiva/sclera: Conjunctivae normal.     Pupils: Pupils are equal, round, and reactive to light.  Cardiovascular:     Rate and Rhythm: Normal rate.  Pulmonary:     Effort: Pulmonary effort is normal.  Skin:    Coloration: Skin is not jaundiced.     Comments: Small dime size region of ecchymosis on the inferior part of the left lower eyelid.  Otherwise no evidence of bleeding in the orbit or conjunctiva.  Neurological:     Mental Status: He is alert. Mental status is at baseline.    ED Results / Procedures / Treatments   Labs (all labs ordered are listed, but only abnormal results are displayed) Labs Reviewed - No data to display  EKG None  Radiology No results  found.  Procedures Procedures   Medications Ordered in ED Medications - No data to display  ED Course  I have reviewed the triage vital signs and the nursing notes.  Pertinent labs & imaging results that were available during my care of the patient were reviewed by me and considered in my medical decision making (see chart for details).    MDM Rules/Calculators/A&P                           Patient appears to have a small area of bruising underneath the left eyelid.  No active bleeding noted.  No other eye injury noted.  No vision changes reported per patient.  Discharged home in stable condition advised follow-up with his doctor within the week.  Advised to return for worsening symptoms or any additional concerns.  Final Clinical Impression(s) / ED Diagnoses Final diagnoses:  Contusion of face, initial encounter    Rx / DC Orders ED Discharge Orders     None        Luna Fuse, MD 10/24/20 2014

## 2020-10-24 NOTE — Discharge Instructions (Addendum)
Follow-up with your doctor in the next week or so.  Return to the ER if you have worsening area of color change, pain, change in vision or any additional concerns.

## 2020-10-24 NOTE — ED Triage Notes (Signed)
Pt noted some discoloration under left eye, denies hitting his eye and denies any pain.  Pt is taking Plavix, pt taking due to stroke to left eye.

## 2020-10-25 ENCOUNTER — Telehealth: Payer: Self-pay | Admitting: Nurse Practitioner

## 2020-10-25 NOTE — Telephone Encounter (Signed)
Called patient and was asking him if he has seen neurology and patient states "just never mind" and hung up.

## 2020-10-25 NOTE — Telephone Encounter (Signed)
Patient called back - states that he has not followed up with neurology since his TIA

## 2020-10-25 NOTE — Telephone Encounter (Signed)
Did he follow-up with neurology after his TIA?

## 2020-10-25 NOTE — Telephone Encounter (Signed)
This is what I see on his discharge summary:  Recommendations for Outpatient Follow-up:  Follow up with PCP in 1-2 weeks Follow-up with neurology as an outpatient Continue aspirin and Plavix for 3 weeks then Plavix alone

## 2020-10-26 NOTE — Telephone Encounter (Signed)
Rc for nurse 

## 2020-10-26 NOTE — Telephone Encounter (Signed)
lmtcb

## 2020-10-26 NOTE — Telephone Encounter (Signed)
Pt aware of provider recommendations and voiced understanding.

## 2020-11-01 ENCOUNTER — Encounter: Payer: Self-pay | Admitting: Nurse Practitioner

## 2020-11-01 ENCOUNTER — Ambulatory Visit: Payer: Medicare PPO | Admitting: Nurse Practitioner

## 2020-11-01 ENCOUNTER — Other Ambulatory Visit: Payer: Self-pay

## 2020-11-01 VITALS — BP 106/64 | HR 71 | Temp 97.4°F | Ht 71.0 in | Wt 221.8 lb

## 2020-11-01 DIAGNOSIS — Z23 Encounter for immunization: Secondary | ICD-10-CM | POA: Diagnosis not present

## 2020-11-01 DIAGNOSIS — N183 Chronic kidney disease, stage 3 unspecified: Secondary | ICD-10-CM

## 2020-11-01 DIAGNOSIS — E1122 Type 2 diabetes mellitus with diabetic chronic kidney disease: Secondary | ICD-10-CM

## 2020-11-01 DIAGNOSIS — I25810 Atherosclerosis of coronary artery bypass graft(s) without angina pectoris: Secondary | ICD-10-CM | POA: Diagnosis not present

## 2020-11-01 DIAGNOSIS — C61 Malignant neoplasm of prostate: Secondary | ICD-10-CM

## 2020-11-01 DIAGNOSIS — K219 Gastro-esophageal reflux disease without esophagitis: Secondary | ICD-10-CM | POA: Diagnosis not present

## 2020-11-01 DIAGNOSIS — E119 Type 2 diabetes mellitus without complications: Secondary | ICD-10-CM | POA: Diagnosis not present

## 2020-11-01 DIAGNOSIS — I10 Essential (primary) hypertension: Secondary | ICD-10-CM | POA: Diagnosis not present

## 2020-11-01 DIAGNOSIS — E782 Mixed hyperlipidemia: Secondary | ICD-10-CM | POA: Diagnosis not present

## 2020-11-01 DIAGNOSIS — Z6835 Body mass index (BMI) 35.0-35.9, adult: Secondary | ICD-10-CM | POA: Diagnosis not present

## 2020-11-01 LAB — CBC WITH DIFFERENTIAL/PLATELET
Basophils Absolute: 0 10*3/uL (ref 0.0–0.2)
Basos: 1 %
EOS (ABSOLUTE): 0.3 10*3/uL (ref 0.0–0.4)
Eos: 5 %
Hematocrit: 39.9 % (ref 37.5–51.0)
Hemoglobin: 13.4 g/dL (ref 13.0–17.7)
Immature Grans (Abs): 0 10*3/uL (ref 0.0–0.1)
Immature Granulocytes: 0 %
Lymphocytes Absolute: 0.9 10*3/uL (ref 0.7–3.1)
Lymphs: 16 %
MCH: 28 pg (ref 26.6–33.0)
MCHC: 33.6 g/dL (ref 31.5–35.7)
MCV: 83 fL (ref 79–97)
Monocytes Absolute: 0.4 10*3/uL (ref 0.1–0.9)
Monocytes: 7 %
Neutrophils Absolute: 3.9 10*3/uL (ref 1.4–7.0)
Neutrophils: 71 %
Platelets: 171 10*3/uL (ref 150–450)
RBC: 4.79 x10E6/uL (ref 4.14–5.80)
RDW: 14.1 % (ref 11.6–15.4)
WBC: 5.4 10*3/uL (ref 3.4–10.8)

## 2020-11-01 LAB — BAYER DCA HB A1C WAIVED: HB A1C (BAYER DCA - WAIVED): 5.3 % (ref 4.8–5.6)

## 2020-11-01 LAB — LIPID PANEL
Chol/HDL Ratio: 4 ratio (ref 0.0–5.0)
Cholesterol, Total: 108 mg/dL (ref 100–199)
HDL: 27 mg/dL — ABNORMAL LOW (ref >39)
LDL Chol Calc (NIH): 60 mg/dL (ref 0–99)
Triglycerides: 115 mg/dL (ref 0–149)
VLDL Cholesterol Cal: 21 mg/dL (ref 5–40)

## 2020-11-01 LAB — CMP14+EGFR
ALT: 11 IU/L (ref 0–44)
AST: 13 IU/L (ref 0–40)
Albumin/Globulin Ratio: 1.7 (ref 1.2–2.2)
Albumin: 4.4 g/dL (ref 3.7–4.7)
Alkaline Phosphatase: 97 IU/L (ref 44–121)
BUN/Creatinine Ratio: 11 (ref 10–24)
BUN: 17 mg/dL (ref 8–27)
Bilirubin Total: 0.4 mg/dL (ref 0.0–1.2)
CO2: 23 mmol/L (ref 20–29)
Calcium: 9.6 mg/dL (ref 8.6–10.2)
Chloride: 102 mmol/L (ref 96–106)
Creatinine, Ser: 1.54 mg/dL — ABNORMAL HIGH (ref 0.76–1.27)
Globulin, Total: 2.6 g/dL (ref 1.5–4.5)
Glucose: 119 mg/dL — ABNORMAL HIGH (ref 70–99)
Potassium: 4.7 mmol/L (ref 3.5–5.2)
Sodium: 139 mmol/L (ref 134–144)
Total Protein: 7 g/dL (ref 6.0–8.5)
eGFR: 47 mL/min/{1.73_m2} — ABNORMAL LOW (ref >59)

## 2020-11-01 NOTE — Progress Notes (Signed)
Subjective:    Patient ID: Jacob Rios, male    DOB: Jun 25, 1945, 75 y.o.   MRN: [id number]  Chief Complaint: Diabetes    HPI:  1. Type 2 diabetes mellitus without complication, without long-term current use of insulin (HCC) Fasting blood sugars running from 95/125. No low blood sugars. Lab Results  Component Value Date   HGBA1C 6.5 (H) 08/28/2020     2. Essential hypertension No c/o chest pain, sob or headaches. Does check blood sugars occasionally. BP Readings from Last 3 Encounters:  11/01/20 106/64  10/24/20 130/60  09/28/20 117/67     3. Mixed hyperlipidemia Does watch diet but does nit do any dedicated exercise. Lab Results  Component Value Date   CHOL 121 08/28/2020   HDL 31 (L) 08/28/2020   LDLCALC 72 08/28/2020   TRIG 89 08/28/2020   CHOLHDL 3.9 08/28/2020     4. Coronary atherosclerosis of autologous vein bypass graft without angina Last saw cardiology on 02/08/20. According to office note- no changes were made to plan of car.  5. Gastroesophageal reflux disease without esophagitis Is on omeprazole daily and works well to keep symptoms under control.  6. CKD stage 3 due to type 2 diabetes mellitus ([place]) Lab Results  Component Value Date   CREATININE 1.67 (H) 08/28/2020     7. Malignant neoplasm of prostate [facility]) Currently has radiation seeds implanted- sees urology every several months  8. BMI 35.0-35.9,adult Weight is down 14lbs Wt Readings from Last 3 Encounters:  11/01/20 221 lb 12.8 oz (100.6 kg)  10/24/20 235 lb (106.6 kg)  09/28/20 227 lb (103 kg)   BMI Readings from Last 3 Encounters:  11/01/20 30.93 kg/m  10/24/20 32.78 kg/m  09/28/20 31.66 kg/m       Outpatient Encounter Medications as of 11/01/2020  Medication Sig   atorvastatin (LIPITOR) 40 MG tablet Take 1 tablet (40 mg total) by mouth daily.   Cholecalciferol (D3 ADULT PO) Take 1 tablet by mouth daily.   clopidogrel (PLAVIX) 75 MG tablet Take 1 tablet (75 mg total)  by mouth daily.   glipiZIDE (GLUCOTROL XL) 10 MG 24 hr tablet Take 1 tablet (10 mg total) by mouth daily.   glucose blood (ONETOUCH VERIO) test strip Test 1X per day and as needed  Dx 250.02   Lancets (ONETOUCH ULTRASOFT) lancets Patient test 1X per day and prn  Dx 250.02   lisinopril (ZESTRIL) 2.5 MG tablet Take 1 tablet (2.5 mg total) by mouth daily.   metFORMIN (GLUCOPHAGE) 1000 MG tablet Take 1 tablet (1,000 mg total) by mouth 2 (two) times daily with a meal.   metoprolol succinate (TOPROL-XL) 50 MG 24 hr tablet Take 1 tablet (50 mg total) by mouth daily. Take with or immediately following a meal.   omeprazole (PRILOSEC) 40 MG capsule Take 1 capsule (40 mg total) by mouth daily.   vitamin C (ASCORBIC ACID) 500 MG tablet Take 500 mg by mouth daily.   [DISCONTINUED] CRANBERRY PO Take 1 capsule by mouth daily.   No facility-administered encounter medications on file as of 11/01/2020.    Past Surgical History:  Procedure Laterality Date   APPENDECTOMY  child   CARDIAC CATHETERIZATION  09/01/2000  _0    patent RCA and Diagonal grafts, atretic LIMA, moderate nonobstructive proxLAD, normal lvsf   CARDIAC CATHETERIZATION  04-21-2003  _1    LIMA--LAD graft occluded, native LAD with nonobstructive disease,  patent diagonal/ rca grafts   CATARACT EXTRACTION     CATARACT EXTRACTION W/ INTRAOCULAR  LENS IMPLANT Right 12/2018   CORONARY ANGIOPLASTY WITH STENT PLACEMENT  08/1997  _0    ptca w/ stenting to rca   CORONARY ARTERY BYPASS GRAFT  06-06-1998  _1     LIMA -- LAD, SVG -- Diagonal,  RIMA to RCA   CYSTOSCOPY N/A 03/09/2020   Procedure: CYSTOSCOPY FLEXIBLE;  Surgeon: [name], MD;  Location: [facility];  Service: Urology;  Laterality: N/A;  NO SEEDS FOUND IN BLADDER   INGUINAL HERNIA REPAIR Right 1974   LOOP RECORDER INSERTION N/A 08/30/2020   Procedure: LOOP RECORDER INSERTION;  Surgeon: Vickie Epley, MD;  Location: [place] [facility];  Service: Cardiovascular;   Laterality: N/A;   PROSTATE BIOPSY  06/2017   x 4-5    RADIOACTIVE SEED IMPLANT N/A 03/09/2020   Procedure: RADIOACTIVE SEED IMPLANT/BRACHYTHERAPY IMPLANT;  Surgeon: [name], MD;  Location: [facility];  Service: Urology;  Laterality: N/A;   70  SEEDS IMPLANTED   SPACE OAR INSTILLATION N/A 03/09/2020   Procedure: SPACE OAR INSTILLATION;  Surgeon: [name], MD;  Location: [facility];  Service: Urology;  Laterality: N/A;   VENTRAL HERNIA REPAIR  09/ 2000 and recurrent repair 06/ 2001    Family History  Problem Relation Age of Onset   Lung cancer Mother    Dementia Father    Hyperlipidemia Father    Congestive Heart Failure Father    Post-traumatic stress disorder Son    Alcohol abuse Maternal Grandfather    Cancer Maternal Grandfather 90       stomach cancer    Heart attack Neg Hx    Stroke Neg Hx    Breast cancer Neg Hx    Colon cancer Neg Hx    Prostate cancer Neg Hx    Pancreatic cancer Neg Hx     New complaints: None today  Social history: Lives with his wife  Controlled substance contract: n/a     Review of Systems  Constitutional:  Negative for diaphoresis.  Eyes:  Negative for pain.  Respiratory:  Negative for shortness of breath.   Cardiovascular:  Negative for chest pain, palpitations and leg swelling.  Gastrointestinal:  Negative for abdominal pain.  Endocrine: Negative for polydipsia.  Skin:  Negative for rash.  Neurological:  Negative for dizziness, weakness and headaches.  Hematological:  Does not bruise/bleed easily.  All other systems reviewed and are negative.     Objective:   Physical Exam Vitals and nursing note reviewed.  Constitutional:      Appearance: Normal appearance. He is well-developed.  HENT:     Head: Normocephalic.     Nose: Nose normal.  Eyes:     Pupils: Pupils are equal, round, and reactive to light.  Neck:     Thyroid: No thyroid mass or thyromegaly.     Vascular: No carotid bruit or  JVD.     Trachea: Phonation normal.  Cardiovascular:     Rate and Rhythm: Normal rate and regular rhythm.  Pulmonary:     Effort: Pulmonary effort is normal. No respiratory distress.     Breath sounds: Normal breath sounds.  Abdominal:     General: Bowel sounds are normal.     Palpations: Abdomen is soft.     Tenderness: There is no abdominal tenderness.  Musculoskeletal:        General: Normal range of motion.     Cervical back: Normal range of motion and neck supple.  Lymphadenopathy:     Cervical: No  cervical adenopathy.  Skin:    General: Skin is warm and dry.  Neurological:     Mental Status: He is alert and oriented to person, place, and time.  Psychiatric:        Behavior: Behavior normal.        Thought Content: Thought content normal.        Judgment: Judgment normal.    BP 106/64   Pulse 71   Temp (!) 97.4 F (36.3 C)   Ht _0  (1.803 m)   Wt 221 lb 12.8 oz (100.6 kg)   SpO2 96%   BMI 30.93 kg/m   HGBA1c 5.3%     Assessment & Plan:  QUARAN KEDZIERSKI comes in today with chief complaint of Diabetes   Diagnosis and orders addressed:  1. Type 2 diabetes mellitus without complication, without long-term current use of insulin (HCC) Continue to wtahc fats in diet - Bayer DCA Hb A1c Waived - Lipid panel  2. Essential hypertension Low sodium diet - CBC with Differential/Platelet - CMP14+EGFR  3. Mixed hyperlipidemia Low fat diet  4. Coronary atherosclerosis of autologous vein bypass graft without angina Keep yearly follow up with cardiology  5. Gastroesophageal reflux disease without esophagitis Avoid spicy foods Do not eat 2 hours prior to bedtime  6. CKD stage 3 due to type 2 diabetes mellitus ([place]) Labs pending  7. Malignant neoplasm of prostate [facility]) Keep follow up with urology  8. BMI 35.0-35.9,adult Discussed diet and exercise for person with BMI >25 Will recheck weight in 3-6 months   9. Need for immunization against influenza - Flu  Vaccine QUAD High Dose(Fluad)   Labs pending Health Maintenance reviewed Diet and exercise encouraged  Follow up plan: 3 months   [name], FNP

## 2020-11-08 LAB — CUP PACEART REMOTE DEVICE CHECK
Date Time Interrogation Session: 20221027113349
Implantable Pulse Generator Implant Date: 20220818

## 2020-11-12 ENCOUNTER — Ambulatory Visit (INDEPENDENT_AMBULATORY_CARE_PROVIDER_SITE_OTHER): Payer: Medicare PPO

## 2020-11-12 DIAGNOSIS — I639 Cerebral infarction, unspecified: Secondary | ICD-10-CM | POA: Diagnosis not present

## 2020-11-13 ENCOUNTER — Other Ambulatory Visit: Payer: Self-pay | Admitting: *Deleted

## 2020-11-13 MED ORDER — CLOPIDOGREL BISULFATE 75 MG PO TABS: 75.0000 mg | ORAL_TABLET | Freq: Every day | ORAL | 5 refills | Status: DC

## 2020-11-19 NOTE — Progress Notes (Signed)
[facility] Note  11/21/2020     CHIEF COMPLAINT Patient presents for Retina Follow Up   HISTORY OF PRESENT ILLNESS: Jacob Rios is a 75 y.o. male who presents to the clinic today for:   HPI     Retina Follow Up   Patient presents with  Other.  In left eye.  This started months ago.  Severity is moderate.  Duration of 2 months.  Since onset it is stable.  I, the attending physician,  performed the HPI with the patient and updated documentation appropriately.        Comments   75 y/o male pt here for 2 mo f/u for BRAO OS.  No change in [place] OU.  Denies pain, FOL.  Has floaters OS.  AT prn OU.      Last edited by Bernarda Caffey, MD on 11/22/2020 11:08 PM.    Pt states no change in vision, he states a lot of light helps him see better  Referring physician: [name], [place],  [place] [id number]  HISTORICAL INFORMATION:   Selected notes from the MEDICAL RECORD NUMBER Referred by Dr. Marin Comment for BRAO OS LEE:  Ocular Hx- PMH-    CURRENT MEDICATIONS: No current outpatient medications on file. (Ophthalmic Drugs)   No current facility-administered medications for this visit. (Ophthalmic Drugs)   Current Outpatient Medications (Other)  Medication Sig   atorvastatin (LIPITOR) 40 MG tablet Take 1 tablet (40 mg total) by mouth daily.   Cholecalciferol (D3 ADULT PO) Take 1 tablet by mouth daily.   clopidogrel (PLAVIX) 75 MG tablet Take 1 tablet (75 mg total) by mouth daily.   glipiZIDE (GLUCOTROL XL) 10 MG 24 hr tablet Take 1 tablet (10 mg total) by mouth daily.   glucose blood (ONETOUCH VERIO) test strip Test 1X per day and as needed  Dx 250.02   Lancets (ONETOUCH ULTRASOFT) lancets Patient test 1X per day and prn  Dx 250.02   lisinopril (ZESTRIL) 2.5 MG tablet Take 1 tablet (2.5 mg total) by mouth daily.   metFORMIN (GLUCOPHAGE) 1000 MG tablet Take 1 tablet (1,000 mg total) by mouth 2 (two) times daily with a meal.    metoprolol succinate (TOPROL-XL) 50 MG 24 hr tablet Take 1 tablet (50 mg total) by mouth daily. Take with or immediately following a meal.   omeprazole (PRILOSEC) 40 MG capsule Take 1 capsule (40 mg total) by mouth daily.   vitamin C (ASCORBIC ACID) 500 MG tablet Take 500 mg by mouth daily.   No current facility-administered medications for this visit. (Other)   REVIEW OF SYSTEMS: ROS   Positive for: Gastrointestinal, Genitourinary, Endocrine, Eyes Negative for: Constitutional, Neurological, Skin, Musculoskeletal, HENT, Cardiovascular, Respiratory, Psychiatric, Allergic/Imm, Heme/Lymph Last edited by Matthew Folks, COA on 11/21/2020  8:46 AM.      ALLERGIES Allergies  Allergen Reactions   Morphine Shortness Of Breath and Swelling    PAST MEDICAL HISTORY Past Medical History:  Diagnosis Date   Cataract    CKD (chronic kidney disease), stage III ([place])    followed by pcp   Coronary artery disease cardiologist--- dr Angelena Form   08/ 1999  s/p  cath w/ PTCA and stenting to RCA;   05/ 2000 inferior wall MI , 06-06-1998 s/p cabg x3;   Last heart cath 2005,  2 patent grafts (diagnol and RCA) and occluded LIMA--LAD graft with normal LAD nonobstructive disease;  last nuclear study 11/ 2014 no evidence  ishcemia, ef 40%   Full dentures    Hiatal hernia    History of acute inferior wall MI 05/1998   s/p  cabg   Hypertension    Hypertensive retinopathy    Mixed hyperlipidemia    Nocturia more than twice per night    PAD (peripheral artery disease) (HCC)    left common iliac artery stenosis per aorta ultrasound 03/ 2021 in epic and cardiology note   Prostate cancer [facility]) urologist--- dr Jeffie Pollock   first dx 07/ 2015 in active survillance until bx 11-11-2019,  Stage T2a, Gleason 3+4, PSA 13.3   S/P CABG x 3 06/06/1998   LIMA--LAD, SVG to Diagonal, RIMA to RCA   S/P primary angioplasty with coronary stent 08/1997   stent to RCA   Type 2 diabetes mellitus ([place])    followed by pcp  (03-06-2020  checks blood sugar dialy in am,  fasting sugar-- 110-130)   Past Surgical History:  Procedure Laterality Date   APPENDECTOMY  child   CARDIAC CATHETERIZATION  09/01/2000  @[facility]    patent RCA and Diagonal grafts, atretic LIMA, moderate nonobstructive proxLAD, normal lvsf   CARDIAC CATHETERIZATION  04-21-2003  @[facility]    LIMA--LAD graft occluded, native LAD with nonobstructive disease,  patent diagonal/ rca grafts   CATARACT EXTRACTION     CATARACT EXTRACTION W/ INTRAOCULAR LENS IMPLANT Right 12/2018   CORONARY ANGIOPLASTY WITH STENT PLACEMENT  08/1997  @[facility]    ptca w/ stenting to rca   CORONARY ARTERY BYPASS GRAFT  06-06-1998  @[facility]     LIMA -- LAD, SVG -- Diagonal,  RIMA to RCA   CYSTOSCOPY N/A 03/09/2020   Procedure: CYSTOSCOPY FLEXIBLE;  Surgeon: [name], MD;  Location: [facility];  Service: Urology;  Laterality: N/A;  NO SEEDS FOUND IN BLADDER   EYE SURGERY     INGUINAL HERNIA REPAIR Right 1974   LOOP RECORDER INSERTION N/A 08/30/2020   Procedure: LOOP RECORDER INSERTION;  Surgeon: Vickie Epley, MD;  Location: [facility];  Service: Cardiovascular;  Laterality: N/A;   PROSTATE BIOPSY  06/2017   x 4-5    RADIOACTIVE SEED IMPLANT N/A 03/09/2020   Procedure: RADIOACTIVE SEED IMPLANT/BRACHYTHERAPY IMPLANT;  Surgeon: [name], MD;  Location: [facility];  Service: Urology;  Laterality: N/A;   70  SEEDS IMPLANTED   SPACE OAR INSTILLATION N/A 03/09/2020   Procedure: SPACE OAR INSTILLATION;  Surgeon: [name], MD;  Location: [facility];  Service: Urology;  Laterality: N/A;   VENTRAL HERNIA REPAIR  09/ 2000 and recurrent repair 06/ 2001    FAMILY HISTORY Family History  Problem Relation Age of Onset   Lung cancer Mother    Dementia Father    Hyperlipidemia Father    Congestive Heart Failure Father    Post-traumatic stress disorder Son    Alcohol abuse Maternal Grandfather    Cancer Maternal Grandfather 90       stomach cancer     Heart attack Neg Hx    Stroke Neg Hx    Breast cancer Neg Hx    Colon cancer Neg Hx    Prostate cancer Neg Hx    Pancreatic cancer Neg Hx     SOCIAL HISTORY Social History   Tobacco Use   Smoking status: Former    Packs/day: 4.00    Years: 45.00    Pack years: 180.00    Types: Cigarettes    Quit date: 01/13/1997    Years since quitting: 34.8  Smokeless tobacco: Never   Tobacco comments:    reports he began smoking at age 76  Vaping Use   Vaping Use: Never used  Substance Use Topics   Alcohol use: No   Drug use: Never     There were no vitals filed for this visit.     OPHTHALMIC EXAM:  Base Eye Exam     Visual Acuity (Snellen - Linear)       Right Left   Dist [place] 20/25 -2 20/100 -2   Dist ph [place] NI 20/40 -2         Tonometry (Tonopen, 8:49 AM)       Right Left   Pressure 16 10         Pupils       Dark Light Shape React APD   Right 4 3 Round Brisk None   Left 4 3 Round Brisk None         Visual Fields (Counting fingers)       Left Right    Full Full         Extraocular Movement       Right Left    Full, Ortho Full, Ortho         Neuro/Psych     Oriented x3: Yes   Mood/Affect: Normal         Dilation     Both eyes: 1.0% Mydriacyl, 2.5% Phenylephrine @ 8:49 AM           Slit Lamp and Fundus Exam     Slit Lamp Exam       Right Left   Lids/Lashes Dermatochalasis - upper lid Dermatochalasis - upper lid   Conjunctiva/Sclera nasal and temporal pinguecula nasal and temporal pinguecula   Cornea trace PEE, arcus 1+ Punctate epithelial erosions, well healed cataract wound   Anterior Chamber deep, clear, narrow temporal angle deep, clear, narrow temporal angle   Iris Round and dilated, mild PPM inferiorly Round and dilated, mild PPM inferiorly, No NVI   Lens 3+ Nuclear sclerosis with brunescence, 2-3+ Cortical cataract, trace Posterior subcapsular cataract PC IOL in good position   Vitreous Vitreous syneresis Vitreous  syneresis         Fundus Exam       Right Left   Disc Pink and Sharp mild Pallor, Sharp rim, temporal PPP   C/D Ratio 0.5 0.6   Macula Flat, Blunted foveal reflex, No heme or edema Flat, Blunted foveal reflex, mild retinal whitening superior macula and fovea - resolved, no heme   Vessels attenuated, Tortuous Attenuated superior vessels greater than inferior, Tortuous, no emboli , no NVE   Periphery Attached, No heme  Attached, few DBH superior periphery - improving            IMAGING AND PROCEDURES  Imaging and Procedures for 11/21/2020  OCT, Retina - OU - Both Eyes       Right Eye Quality was good. Central Foveal Thickness: 293. Progression has been stable. Findings include normal foveal contour, no IRF, no SRF, vitreomacular adhesion .   Left Eye Quality was good. Central Foveal Thickness: 275. Progression has improved. Findings include no IRF, no SRF, intraretinal hyper-reflective material, vitreomacular adhesion , inner retinal atrophy, abnormal foveal contour (inner retinal hyper reflectivity superior macula - improving, progression of inner retinal atrophy superior macula, partial PVD).   Notes *Images captured and stored on drive  Diagnosis / Impression:  OD: NFP, no IRF/SRF OS: BRAO affecting superior macula and fovea --  inner retinal hyper reflectivity superior macula - improving, progression of inner retinal atrophy superior macula  Clinical management:  See below  Abbreviations: NFP - Normal foveal profile. CME - cystoid macular edema. PED - pigment epithelial detachment. IRF - intraretinal fluid. SRF - subretinal fluid. EZ - ellipsoid zone. ERM - epiretinal membrane. ORA - outer retinal atrophy. ORT - outer retinal tubulation. SRHM - subretinal hyper-reflective material. IRHM - intraretinal hyper-reflective material            ASSESSMENT/PLAN:    ICD-10-CM   1. BRAO (branch retinal artery occlusion), left  H34.232     2. Retinal edema  H35.81 OCT,  Retina - OU - Both Eyes    3. Essential hypertension  I10     4. Hypertensive retinopathy of both eyes  H35.033     5. Combined forms of age-related cataract of right eye  H25.811     6. Pseudophakia  Z96.1      1,2. BRAO OS - pt reports history of painless inferior visual field loss OS -- onset August 15 - exam shows mild retinal whitening of superior macula and fovea; no emboli noted - BCVA 20/40 (PH), IOP 10 - OCT shows evolving BRAO effecting superior macula and fovea -- decrease in inner retinal hyper-reflectivity and progression of inner retinal atrophy - FA (08.16.22) shows severely delayed filling time, but eventual perfusion of all retinal vessels OS - ROS for GCA negative -- no headache, jaw claudication, scalp tenderness, numbness/tingling - pt was sent to ED for stroke work up on 8.16.22 -- admitted and MRI showed L parietal stroke -- started on plavix - exam today shows mild improvement in superior macular whitening - no neovascularization; no NVG or elevated IOP - no retinal or ophthalmic intervention indicated or recommended at this time - F/U 3 months -- DFE/OCT  3,4. Hypertensive retinopathy OU - discussed importance of tight BP control - monitor   5. Mixed Cataract OD - The symptoms of cataract, surgical options, and treatments and risks were discussed with patient. - discussed diagnosis and progression - not yet visually significant - monitor for now  6. Pseudophakia OS  - s/p CE/IOL (Dr. Brigitte Pulse, CEA, Dec. 2020)  - IOL in good position, doing well  - monitor   Ophthalmic Meds Ordered this visit:  No orders of the defined types were placed in this encounter.    Return in about 3 months (around 02/21/2021) for f/u BRAO OS, DFE, OCT.  There are no Patient Instructions on file for this visit.   Explained the diagnoses, plan, and follow up with the patient and they expressed understanding.  Patient expressed understanding of the importance of proper follow  up care.   This document serves as a record of services personally performed by Gardiner Sleeper, MD, PhD. It was created on their behalf by Orvan Falconer, an ophthalmic technician. The creation of this record is the provider's dictation and/or activities during the visit.    Electronically signed by: Orvan Falconer, OA, 11/22/20  11:20 PM   Gardiner Sleeper, M.D., Ph.D. Diseases & Surgery of the Retina and Vitreous [facility]  I have reviewed the above documentation for accuracy and completeness, and I agree with the above. Gardiner Sleeper, M.D., Ph.D. 11/22/20 11:20 PM   Abbreviations: M myopia (nearsighted); A astigmatism; H hyperopia (farsighted); P presbyopia; Mrx spectacle prescription;  CTL contact lenses; OD right eye; OS left eye; OU both eyes  XT exotropia; ET esotropia;  PEK punctate epithelial keratitis; PEE punctate epithelial erosions; DES dry eye syndrome; MGD meibomian gland dysfunction; ATs artificial tears; PFAT's preservative free artificial tears; [place] nuclear sclerotic cataract; PSC posterior subcapsular cataract; ERM epi-retinal membrane; PVD posterior vitreous detachment; RD retinal detachment; DM diabetes mellitus; DR diabetic retinopathy; NPDR non-proliferative diabetic retinopathy; PDR proliferative diabetic retinopathy; CSME clinically significant macular edema; DME diabetic macular edema; dbh dot blot hemorrhages; CWS cotton wool spot; POAG primary open angle glaucoma; C/D cup-to-disc ratio; HVF humphrey visual field; GVF goldmann visual field; OCT optical coherence tomography; IOP intraocular pressure; BRVO Branch retinal vein occlusion; CRVO central retinal vein occlusion; CRAO central retinal artery occlusion; BRAO branch retinal artery occlusion; RT retinal tear; SB scleral buckle; PPV pars plana vitrectomy; VH Vitreous hemorrhage; PRP panretinal laser photocoagulation; IVK intravitreal kenalog; VMT vitreomacular traction; MH Macular hole;  NVD  neovascularization of the disc; NVE neovascularization elsewhere; AREDS age related eye disease study; ARMD age related macular degeneration; POAG primary open angle glaucoma; EBMD epithelial/anterior basement membrane dystrophy; ACIOL anterior chamber intraocular lens; IOL intraocular lens; PCIOL posterior chamber intraocular lens; Phaco/IOL phacoemulsification with intraocular lens placement; [place] photorefractive keratectomy; LASIK laser assisted in situ keratomileusis; HTN hypertension; DM diabetes mellitus; COPD chronic obstructive pulmonary disease

## 2020-11-20 NOTE — Progress Notes (Signed)
[facility] Summary Report / Loop Recorder 

## 2020-11-21 ENCOUNTER — Encounter (INDEPENDENT_AMBULATORY_CARE_PROVIDER_SITE_OTHER): Payer: Self-pay | Admitting: Ophthalmology

## 2020-11-21 ENCOUNTER — Other Ambulatory Visit: Payer: Self-pay

## 2020-11-21 ENCOUNTER — Ambulatory Visit (INDEPENDENT_AMBULATORY_CARE_PROVIDER_SITE_OTHER): Payer: Medicare PPO | Admitting: Ophthalmology

## 2020-11-21 DIAGNOSIS — H25811 Combined forms of age-related cataract, right eye: Secondary | ICD-10-CM | POA: Diagnosis not present

## 2020-11-21 DIAGNOSIS — H35033 Hypertensive retinopathy, bilateral: Secondary | ICD-10-CM

## 2020-11-21 DIAGNOSIS — Z961 Presence of intraocular lens: Secondary | ICD-10-CM | POA: Diagnosis not present

## 2020-11-21 DIAGNOSIS — H34232 Retinal artery branch occlusion, left eye: Secondary | ICD-10-CM | POA: Diagnosis not present

## 2020-11-21 DIAGNOSIS — H3581 Retinal edema: Secondary | ICD-10-CM

## 2020-11-21 DIAGNOSIS — I10 Essential (primary) hypertension: Secondary | ICD-10-CM | POA: Diagnosis not present

## 2020-11-22 ENCOUNTER — Encounter (INDEPENDENT_AMBULATORY_CARE_PROVIDER_SITE_OTHER): Payer: Self-pay | Admitting: Ophthalmology

## 2020-12-12 LAB — CUP PACEART REMOTE DEVICE CHECK
Date Time Interrogation Session: 20221129113426
Implantable Pulse Generator Implant Date: 20220818

## 2020-12-17 ENCOUNTER — Ambulatory Visit (INDEPENDENT_AMBULATORY_CARE_PROVIDER_SITE_OTHER): Payer: Medicare PPO

## 2020-12-17 DIAGNOSIS — I639 Cerebral infarction, unspecified: Secondary | ICD-10-CM

## 2020-12-26 NOTE — Progress Notes (Signed)
[facility] Summary Report / Loop Recorder 

## 2020-12-27 ENCOUNTER — Other Ambulatory Visit: Payer: Medicare PPO

## 2020-12-27 ENCOUNTER — Other Ambulatory Visit: Payer: Self-pay

## 2020-12-27 DIAGNOSIS — C61 Malignant neoplasm of prostate: Secondary | ICD-10-CM | POA: Diagnosis not present

## 2020-12-28 LAB — PSA: Prostate Specific Ag, Serum: 1.2 ng/mL (ref 0.0–4.0)

## 2021-01-03 ENCOUNTER — Ambulatory Visit: Payer: Medicare PPO | Admitting: Urology

## 2021-01-03 ENCOUNTER — Other Ambulatory Visit: Payer: Self-pay

## 2021-01-03 VITALS — BP 177/52 | HR 66

## 2021-01-03 DIAGNOSIS — R351 Nocturia: Secondary | ICD-10-CM

## 2021-01-03 DIAGNOSIS — C61 Malignant neoplasm of prostate: Secondary | ICD-10-CM

## 2021-01-03 LAB — URINALYSIS, ROUTINE W REFLEX MICROSCOPIC
Bilirubin, UA: NEGATIVE
Glucose, UA: NEGATIVE
Ketones, UA: NEGATIVE
Leukocytes,UA: NEGATIVE
Nitrite, UA: NEGATIVE
Protein,UA: NEGATIVE
RBC, UA: NEGATIVE
Specific Gravity, UA: 1.02 (ref 1.005–1.030)
Urobilinogen, Ur: 0.2 mg/dL (ref 0.2–1.0)
pH, UA: 6.5 (ref 5.0–7.5)

## 2021-01-03 NOTE — Progress Notes (Signed)
Subjective:  1. Cancer of prostate ([place])   2. Nocturia     01/03/21: Jacob Rios returns in f/u for the history below.  His PSA is down further to 1.2.  He is voiding well but has nocturia x 4 which is stable.  His IPSS is 6.  He had a stroke of the left eye since the last visit.  He was hospitalized with that for a week in August.   He had a Loop recorder placed in August for further monitoring and is now on Plavix.  He has had partial recovery of his vision.  UA is clear.     07/05/20: Jacob Rios returns today in f/u for his history of prostate cancer.  His PSA has fallen to 3.0 from 13.3 prior to treatment.   His IPSS is 10 with nocturia x 4.   His UA is ok today.    03/29/20: Jacob Rios returns today in f/u from his recent seed implant on 03/09/20.  He is voiding well with an IPSS of 8. He has nocturia x 4.  He has some hesitancy.  His UA has >30 RBC's.   He has felt a lump about an inch up in the rectum that is probably the [place].  He has mild dysuria.     11/22/19: Jacob Rios returns today following a prostate biopsy for his history of low risk prostate cancer on surveillance.   The biopsy demonstrated 3 positive cores with Gleason 6 in the right and left apical lateral cores and Gleason 7(3+4) in the right apical medial core.   The prostate volume was 27m.    His most recent PSA was 13.3 and has been rising.   He had a UTI and went to the ER in late July 2020 and was given antibiotics but had Mx species on the culture.    He has not had any further UTI's.  He passed a stone last year.  His IPSS is10.  His UA today is clear.   He had an MRI fusion biopsy in 3/19 for surveillance of his low risk prostate cancer. He had a single core of 20% of gleason 6 disease in the left lateral apex. he had atypia in one core. His PSA was 8.5 in 9/19 and 9.0 in 1/19. He has no associated signs or symptoms.   He was found to have a T2a Nx Mx Gleason 6 prostate cancer with 5% in one left apical and one right apical core in  07/26/13. A repeat biopsy in 8/16 had a single core of Gleason 6 in the left mid lateral gland with 10% involvement. His PSA prior to this visit is up to 8.4 from 6.6 in 5/18. It had been stable for the last 2 years. His Prostate volume was 269minitally and 3453mn the repeat. He has a history of a 32m8mght base prostate nodule and BOO. He has no associated signs or symptoms.  IPSS     Row Name 01/03/21 0900         International Prostate Symptom Score   How often have you had the sensation of not emptying your bladder? Less than 1 in 5     How often have you had to urinate less than every two hours? Less than 1 in 5 times     How often have you found you stopped and started again several times when you urinated? Not at All     How often have you found it difficult to postpone urination?  Not at All     How often have you had a weak urinary stream? Not at All     How often have you had to strain to start urination? Not at All     How many times did you typically get up at night to urinate? 4 Times     Total IPSS Score 6       Quality of Life due to urinary symptoms   If you were to spend the rest of your life with your urinary condition just the way it is now how would you feel about that? Pleased                ROS:  ROS:  A complete review of systems was performed.  All systems are negative except for pertinent findings as noted.   ROS  Allergies  Allergen Reactions   Morphine Shortness Of Breath and Swelling    Outpatient Encounter Medications as of 01/03/2021  Medication Sig   atorvastatin (LIPITOR) 40 MG tablet Take 1 tablet (40 mg total) by mouth daily.   Cholecalciferol (D3 ADULT PO) Take 1 tablet by mouth daily.   clopidogrel (PLAVIX) 75 MG tablet Take 1 tablet (75 mg total) by mouth daily.   glipiZIDE (GLUCOTROL XL) 10 MG 24 hr tablet Take 1 tablet (10 mg total) by mouth daily.   glucose blood (ONETOUCH VERIO) test strip Test 1X per day and as needed  Dx 250.02    Lancets (ONETOUCH ULTRASOFT) lancets Patient test 1X per day and prn  Dx 250.02   lisinopril (ZESTRIL) 2.5 MG tablet Take 1 tablet (2.5 mg total) by mouth daily.   metFORMIN (GLUCOPHAGE) 1000 MG tablet Take 1 tablet (1,000 mg total) by mouth 2 (two) times daily with a meal.   metoprolol succinate (TOPROL-XL) 50 MG 24 hr tablet Take 1 tablet (50 mg total) by mouth daily. Take with or immediately following a meal.   omeprazole (PRILOSEC) 40 MG capsule Take 1 capsule (40 mg total) by mouth daily.   vitamin C (ASCORBIC ACID) 500 MG tablet Take 500 mg by mouth daily.   No facility-administered encounter medications on file as of 01/03/2021.    Past Medical History:  Diagnosis Date   Cataract    CKD (chronic kidney disease), stage III ([place])    followed by pcp   Coronary artery disease cardiologist--- dr Angelena Form   08/ 1999  s/p  cath w/ PTCA and stenting to RCA;   05/ 2000 inferior wall MI , 06-06-1998 s/p cabg x3;   Last heart cath 2005,  2 patent grafts (diagnol and RCA) and occluded LIMA--LAD graft with normal LAD nonobstructive disease;  last nuclear study 11/ 2014 no evidence ishcemia, ef 40%   Full dentures    Hiatal hernia    History of acute inferior wall MI 05/1998   s/p  cabg   Hypertension    Hypertensive retinopathy    Mixed hyperlipidemia    Nocturia more than twice per night    PAD (peripheral artery disease) ([place])    left common iliac artery stenosis per aorta ultrasound 03/ 2021 in epic and cardiology note   Prostate cancer [facility]) urologist--- dr Jeffie Pollock   first dx 07/ 2015 in active survillance until bx 11-11-2019,  Stage T2a, Gleason 3+4, PSA 13.3   S/P CABG x 3 06/06/1998   LIMA--LAD, SVG to Diagonal, RIMA to RCA   S/P primary angioplasty with coronary stent 08/1997   stent to RCA   Type  2 diabetes mellitus ([place])    followed by pcp  (03-06-2020 checks blood sugar dialy in am,  fasting sugar-- 110-130)    Past Surgical History:  Procedure Laterality Date    APPENDECTOMY  child   CARDIAC CATHETERIZATION  09/01/2000  '@[facility]'    patent RCA and Diagonal grafts, atretic LIMA, moderate nonobstructive proxLAD, normal lvsf   CARDIAC CATHETERIZATION  04-21-2003  '@[facility]'    LIMA--LAD graft occluded, native LAD with nonobstructive disease,  patent diagonal/ rca grafts   CATARACT EXTRACTION     CATARACT EXTRACTION W/ INTRAOCULAR LENS IMPLANT Right 12/2018   CORONARY ANGIOPLASTY WITH STENT PLACEMENT  08/1997  '@[facility]'    ptca w/ stenting to rca   CORONARY ARTERY BYPASS GRAFT  06-06-1998  '@[facility]'     LIMA -- LAD, SVG -- Diagonal,  RIMA to RCA   CYSTOSCOPY N/A 03/09/2020   Procedure: CYSTOSCOPY FLEXIBLE;  Surgeon: [name], MD;  Location: [facility];  Service: Urology;  Laterality: N/A;  NO SEEDS FOUND IN BLADDER   EYE SURGERY     INGUINAL HERNIA REPAIR Right 1974   LOOP RECORDER INSERTION N/A 08/30/2020   Procedure: LOOP RECORDER INSERTION;  Surgeon: Vickie Epley, MD;  Location: [place] [facility];  Service: Cardiovascular;  Laterality: N/A;   PROSTATE BIOPSY  06/2017   x 4-5    RADIOACTIVE SEED IMPLANT N/A 03/09/2020   Procedure: RADIOACTIVE SEED IMPLANT/BRACHYTHERAPY IMPLANT;  Surgeon: [name], MD;  Location: [facility];  Service: Urology;  Laterality: N/A;   70  SEEDS IMPLANTED   SPACE OAR INSTILLATION N/A 03/09/2020   Procedure: SPACE OAR INSTILLATION;  Surgeon: [name], MD;  Location: [facility];  Service: Urology;  Laterality: N/A;   VENTRAL HERNIA REPAIR  09/ 2000 and recurrent repair 06/ 2001    Social History   Socioeconomic History   Marital status: Married    Spouse name: Vaughan Basta    Number of children: 3   Years of education: Not on file   Highest education level: Not on file  Occupational History   Occupation: part-time trucker    Comment: retired   Tobacco Use   Smoking status: Former    Packs/day: 4.00    Years: 45.00    Pack years: 180.00    Types: Cigarettes    Quit date: 01/13/1997     Years since quitting: 23.9   Smokeless tobacco: Never   Tobacco comments:    reports he began smoking at age 28  Vaping Use   Vaping Use: Never used  Substance and Sexual Activity   Alcohol use: No   Drug use: Never   Sexual activity: Not on file  Other Topics Concern   Not on file  Social History Narrative   Lives home with wife   Social Determinants of Health   Financial Resource Strain: Low Risk    Difficulty of Paying Living Expenses: Not very hard  Food Insecurity: No Food Insecurity   Worried About Charity fundraiser in the Last Year: Never true   [place] in the Last Year: Never true  Transportation Needs: No Transportation Needs   Lack of Transportation (Medical): No   Lack of Transportation (Non-Medical): No  Physical Activity: Sufficiently Active   Days of Exercise per Week: 7 days   Minutes of Exercise per Session: 30 min  Stress: No Stress Concern Present   Feeling of Stress : Only a little  Social Connections: Engineer, building services of  Communication with Friends and Family: More than three times a week   Frequency of Social Gatherings with Friends and Family: More than three times a week   Attends Religious Services: More than 4 times per year   Active Member of [employer] or Organizations: Yes   Attends Music therapist: More than 4 times per year   Marital Status: Married  Human resources officer Violence: Not At Risk   Fear of Current or Ex-Partner: No   Emotionally Abused: No   Physically Abused: No   Sexually Abused: No    Family History  Problem Relation Age of Onset   Lung cancer Mother    Dementia Father    Hyperlipidemia Father    Congestive Heart Failure Father    Post-traumatic stress disorder Son    Alcohol abuse Maternal Grandfather    Cancer Maternal Grandfather 90       stomach cancer    Heart attack Neg Hx    Stroke Neg Hx    Breast cancer Neg Hx    Colon cancer Neg Hx    Prostate cancer Neg Hx    Pancreatic  cancer Neg Hx        Objective: Vitals:   01/03/21 0915  BP: (!) 177/52  Pulse: 66     Physical Exam  Lab Results:  Lab Results  Component Value Date   PSA1 1.2 12/27/2020   PSA1 3.0 06/28/2020   PSA1 13.3 (H) 09/23/2019       BMET No results for input(s): NA, K, CL, CO2, GLUCOSE, BUN, CREATININE, CALCIUM in the last 72 hours. PSA PSA  Date Value Ref Range Status  03/30/2019 10.4 (H) < OR = 4.0 ng/mL Final    Comment:    The total PSA value from this assay system is  standardized against the WHO standard. The test  result will be approximately 20% lower when compared  to the equimolar-standardized total PSA (Beckman  Coulter). Comparison of serial PSA results should be  interpreted with this fact in mind. . This test was performed using the Siemens  chemiluminescent method. Values obtained from  different assay methods cannot be used interchangeably. PSA levels, regardless of value, should not be interpreted as absolute evidence of the presence or absence of disease.   11/18/2013 5.0 (H) 0.0 - 4.0 ng/mL Final    Comment:    Roche ECLIA methodology. According to the American Urological Association, Serum PSA should decrease and remain at undetectable levels after radical prostatectomy. The AUA defines biochemical recurrence as an initial PSA value 0.2 ng/mL or greater followed by a subsequent confirmatory PSA value 0.2 ng/mL or greater. Values obtained with different assay methods or kits cannot be used interchangeably. Results cannot be interpreted as absolute evidence of the presence or absence of malignant disease.   05/20/2013 5.3 (H) 0.0 - 4.0 ng/mL Final    Comment:    Roche ECLIA methodology. According to the American Urological Association, Serum PSA should decrease and remain at undetectable levels after radical prostatectomy. The AUA defines biochemical recurrence as an initial PSA value 0.2 ng/mL or greater followed by a subsequent  confirmatory PSA value 0.2 ng/mL or greater. Values obtained with different assay methods or kits cannot be used interchangeably. Results cannot be interpreted as absolute evidence of the presence or absence of malignant disease.   No results found for: TESTOSTERONE  Recent Results (from the past 2160 hour(s))  CUP PACEART REMOTE DEVICE CHECK     Status: None  Collection Time: 10/06/20 11:33 AM  Result Value Ref Range   Date Time Interrogation Session [id number]    Pulse Generator Manufacturer MERM    Pulse Gen Model LNQ22 LINQ II    Pulse Gen Serial Number [id number]    Clinic Name [facility]    Implantable Pulse Generator Type ICM/ILR    Implantable Pulse Generator Implant Date 32992426   Bayer DCA Hb A1c Waived     Status: None   Collection Time: 11/01/20  9:55 AM  Result Value Ref Range   HB A1C (BAYER DCA - WAIVED) 5.3 4.8 - 5.6 %    Comment:          Prediabetes: 5.7 - 6.4          Diabetes: >6.4          Glycemic control for adults with diabetes: <7.0               **Please note reference interval change**   CBC with Differential/Platelet     Status: None   Collection Time: 11/01/20 10:04 AM  Result Value Ref Range   WBC 5.4 3.4 - 10.8 x10E3/uL   RBC 4.79 4.14 - 5.80 x10E6/uL   Hemoglobin 13.4 13.0 - 17.7 g/dL   Hematocrit 39.9 37.5 - 51.0 %   MCV 83 79 - 97 fL   MCH 28.0 26.6 - 33.0 pg   MCHC 33.6 31.5 - 35.7 g/dL   RDW 14.1 11.6 - 15.4 %   Platelets 171 150 - 450 x10E3/uL   Neutrophils 71 Not Estab. %   Lymphs 16 Not Estab. %   Monocytes 7 Not Estab. %   Eos 5 Not Estab. %   Basos 1 Not Estab. %   Neutrophils Absolute 3.9 1.4 - 7.0 x10E3/uL   Lymphocytes Absolute 0.9 0.7 - 3.1 x10E3/uL   Monocytes Absolute 0.4 0.1 - 0.9 x10E3/uL   EOS (ABSOLUTE) 0.3 0.0 - 0.4 x10E3/uL   Basophils Absolute 0.0 0.0 - 0.2 x10E3/uL   Immature Granulocytes 0 Not Estab. %   Immature Grans (Abs) 0.0 0.0 - 0.1 x10E3/uL  CMP14+EGFR     Status: Abnormal   Collection Time:  11/01/20 10:04 AM  Result Value Ref Range   Glucose 119 (H) 70 - 99 mg/dL   BUN 17 8 - 27 mg/dL   Creatinine, Ser 1.54 (H) 0.76 - 1.27 mg/dL   eGFR 47 (L) >59 mL/min/1.73   BUN/Creatinine Ratio 11 10 - 24   Sodium 139 134 - 144 mmol/L   Potassium 4.7 3.5 - 5.2 mmol/L   Chloride 102 96 - 106 mmol/L   CO2 23 20 - 29 mmol/L   Calcium 9.6 8.6 - 10.2 mg/dL   Total Protein 7.0 6.0 - 8.5 g/dL   Albumin 4.4 3.7 - 4.7 g/dL   Globulin, Total 2.6 1.5 - 4.5 g/dL   Albumin/Globulin Ratio 1.7 1.2 - 2.2   Bilirubin Total 0.4 0.0 - 1.2 mg/dL   Alkaline Phosphatase 97 44 - 121 IU/L   AST 13 0 - 40 IU/L   ALT 11 0 - 44 IU/L  Lipid panel     Status: Abnormal   Collection Time: 11/01/20 10:04 AM  Result Value Ref Range   Cholesterol, Total 108 100 - 199 mg/dL   Triglycerides 115 0 - 149 mg/dL   HDL 27 (L) >39 mg/dL   VLDL Cholesterol Cal 21 5 - 40 mg/dL   LDL Chol Calc (NIH) 60 0 - 99 mg/dL   Chol/HDL  Ratio 4.0 0.0 - 5.0 ratio    Comment:                                   T. Chol/HDL Ratio                                             Men  Women                               1/2 Avg.Risk  3.4    3.3                                   Avg.Risk  5.0    4.4                                2X Avg.Risk  9.6    7.1                                3X Avg.Risk 23.4   11.0   CUP PACEART REMOTE DEVICE CHECK     Status: None   Collection Time: 11/08/20 11:33 AM  Result Value Ref Range   Date Time Interrogation Session [id number]    Pulse Generator Manufacturer MERM    Pulse Gen Model LNQ22 LINQ II    Pulse Gen Serial Number [id number] [place] [facility]    Implantable Pulse Generator Type ICM/ILR    Implantable Pulse Generator Implant Date [id number]   CUP PACEART REMOTE DEVICE CHECK     Status: None   Collection Time: 12/11/20 11:34 AM  Result Value Ref Range   Date Time Interrogation Session [id number]    Pulse Generator Manufacturer MERM    Pulse Gen Model LNQ22 LINQ II    Pulse  Gen Serial Number [id number]    Clinic Name [facility]    Implantable Pulse Generator Type ICM/ILR    Implantable Pulse Generator Implant Date [id number]    Eval Rhythm SR at 70 bpm   PSA     Status: None   Collection Time: 12/27/20  8:38 AM  Result Value Ref Range   Prostate Specific Ag, Serum 1.2 0.0 - 4.0 ng/mL    Comment: Roche ECLIA methodology. According to the American Urological Association, Serum PSA should decrease and remain at undetectable levels after radical prostatectomy. The AUA defines biochemical recurrence as an initial PSA value 0.2 ng/mL or greater followed by a subsequent confirmatory PSA value 0.2 ng/mL or greater. Values obtained with different assay methods or kits cannot be used interchangeably. Results cannot be interpreted as absolute evidence of the presence or absence of malignant disease.   Urinalysis, Routine w reflex microscopic     Status: None   Collection Time: 01/03/21  9:28 AM  Result Value Ref Range   Specific Gravity, UA 1.020 1.005 - 1.030   pH, UA 6.5 5.0 - 7.5   Color, UA Yellow Yellow   Appearance Ur Clear Clear   Leukocytes,UA Negative Negative   Protein,UA Negative Negative/Trace   Glucose, UA Negative Negative  Ketones, UA Negative Negative   RBC, UA Negative Negative   Bilirubin, UA Negative Negative   Urobilinogen, Ur 0.2 0.2 - 1.0 mg/dL   Nitrite, UA Negative Negative   Microscopic Examination Comment     Comment: Microscopic follows if indicated.     Studies/Results: Path report reviewed.     Assessment & Plan: T2a Nx Mx Gleason 7(3+4) disease s/p seed implant.   He is doing well post op with a nice decline in the PSA.  He with return in 6 months with a PSA.   Microhematuria.  Resolved.  Nodular prostate with LUTS.   He has stable nocturia.   No orders of the defined types were placed in this encounter.     Orders Placed This Encounter  Procedures   Urinalysis, Routine w reflex microscopic   PSA     Standing Status:   Future    Standing Expiration Date:   01/03/2022      Return in about 6 months (around 07/04/2021) for with PSA. .   CC: [name], FNP      [name] 01/03/2021 Patient ID: [name], male   DOB: 1945-03-13, 75 y.o.   MRN: [id number]

## 2021-01-03 NOTE — Progress Notes (Signed)

## 2021-01-21 ENCOUNTER — Ambulatory Visit (INDEPENDENT_AMBULATORY_CARE_PROVIDER_SITE_OTHER): Payer: Medicare PPO

## 2021-01-21 DIAGNOSIS — I639 Cerebral infarction, unspecified: Secondary | ICD-10-CM

## 2021-01-21 LAB — CUP PACEART REMOTE DEVICE CHECK
Date Time Interrogation Session: 20230108231139
Implantable Pulse Generator Implant Date: 20220818

## 2021-01-29 NOTE — Progress Notes (Signed)
[facility] Summary Report / Loop Recorder 

## 2021-02-01 ENCOUNTER — Encounter: Payer: Self-pay | Admitting: Nurse Practitioner

## 2021-02-01 ENCOUNTER — Ambulatory Visit: Payer: Medicare PPO | Admitting: Nurse Practitioner

## 2021-02-01 VITALS — BP 127/76 | HR 65 | Temp 97.5°F | Resp 20 | Ht 71.0 in | Wt 221.0 lb

## 2021-02-01 DIAGNOSIS — Z6835 Body mass index (BMI) 35.0-35.9, adult: Secondary | ICD-10-CM | POA: Diagnosis not present

## 2021-02-01 DIAGNOSIS — E1122 Type 2 diabetes mellitus with diabetic chronic kidney disease: Secondary | ICD-10-CM | POA: Diagnosis not present

## 2021-02-01 DIAGNOSIS — E119 Type 2 diabetes mellitus without complications: Secondary | ICD-10-CM

## 2021-02-01 DIAGNOSIS — I10 Essential (primary) hypertension: Secondary | ICD-10-CM

## 2021-02-01 DIAGNOSIS — I25810 Atherosclerosis of coronary artery bypass graft(s) without angina pectoris: Secondary | ICD-10-CM | POA: Diagnosis not present

## 2021-02-01 DIAGNOSIS — I6930 Unspecified sequelae of cerebral infarction: Secondary | ICD-10-CM | POA: Diagnosis not present

## 2021-02-01 DIAGNOSIS — Z8673 Personal history of transient ischemic attack (TIA), and cerebral infarction without residual deficits: Secondary | ICD-10-CM | POA: Insufficient documentation

## 2021-02-01 DIAGNOSIS — C61 Malignant neoplasm of prostate: Secondary | ICD-10-CM

## 2021-02-01 DIAGNOSIS — N183 Chronic kidney disease, stage 3 unspecified: Secondary | ICD-10-CM

## 2021-02-01 DIAGNOSIS — K219 Gastro-esophageal reflux disease without esophagitis: Secondary | ICD-10-CM | POA: Diagnosis not present

## 2021-02-01 DIAGNOSIS — E782 Mixed hyperlipidemia: Secondary | ICD-10-CM

## 2021-02-01 LAB — CBC WITH DIFFERENTIAL/PLATELET
Basophils Absolute: 0.1 10*3/uL (ref 0.0–0.2)
Basos: 1 %
EOS (ABSOLUTE): 0.3 10*3/uL (ref 0.0–0.4)
Eos: 5 %
Hematocrit: 40.3 % (ref 37.5–51.0)
Hemoglobin: 13.6 g/dL (ref 13.0–17.7)
Immature Grans (Abs): 0 10*3/uL (ref 0.0–0.1)
Immature Granulocytes: 0 %
Lymphocytes Absolute: 1.1 10*3/uL (ref 0.7–3.1)
Lymphs: 19 %
MCH: 29 pg (ref 26.6–33.0)
MCHC: 33.7 g/dL (ref 31.5–35.7)
MCV: 86 fL (ref 79–97)
Monocytes Absolute: 0.5 10*3/uL (ref 0.1–0.9)
Monocytes: 8 %
Neutrophils Absolute: 3.6 10*3/uL (ref 1.4–7.0)
Neutrophils: 67 %
Platelets: 189 10*3/uL (ref 150–450)
RBC: 4.69 x10E6/uL (ref 4.14–5.80)
RDW: 14 % (ref 11.6–15.4)
WBC: 5.5 10*3/uL (ref 3.4–10.8)

## 2021-02-01 LAB — LIPID PANEL
Chol/HDL Ratio: 3.7 ratio (ref 0.0–5.0)
Cholesterol, Total: 116 mg/dL (ref 100–199)
HDL: 31 mg/dL — ABNORMAL LOW (ref >39)
LDL Chol Calc (NIH): 68 mg/dL (ref 0–99)
Triglycerides: 83 mg/dL (ref 0–149)
VLDL Cholesterol Cal: 17 mg/dL (ref 5–40)

## 2021-02-01 LAB — CMP14+EGFR
ALT: 11 IU/L (ref 0–44)
AST: 14 IU/L (ref 0–40)
Albumin/Globulin Ratio: 1.6 (ref 1.2–2.2)
Albumin: 4.4 g/dL (ref 3.7–4.7)
Alkaline Phosphatase: 81 IU/L (ref 44–121)
BUN/Creatinine Ratio: 14 (ref 10–24)
BUN: 21 mg/dL (ref 8–27)
Bilirubin Total: 0.4 mg/dL (ref 0.0–1.2)
CO2: 25 mmol/L (ref 20–29)
Calcium: 9.3 mg/dL (ref 8.6–10.2)
Chloride: 102 mmol/L (ref 96–106)
Creatinine, Ser: 1.51 mg/dL — ABNORMAL HIGH (ref 0.76–1.27)
Globulin, Total: 2.7 g/dL (ref 1.5–4.5)
Glucose: 95 mg/dL (ref 70–99)
Potassium: 4.8 mmol/L (ref 3.5–5.2)
Sodium: 140 mmol/L (ref 134–144)
Total Protein: 7.1 g/dL (ref 6.0–8.5)
eGFR: 48 mL/min/{1.73_m2} — ABNORMAL LOW (ref >59)

## 2021-02-01 LAB — BAYER DCA HB A1C WAIVED: HB A1C (BAYER DCA - WAIVED): 5.8 % — ABNORMAL HIGH (ref 4.8–5.6)

## 2021-02-01 MED ORDER — CLOPIDOGREL BISULFATE 75 MG PO TABS: 75.0000 mg | ORAL_TABLET | Freq: Every day | ORAL | 5 refills | Status: DC

## 2021-02-01 MED ORDER — ATORVASTATIN CALCIUM 40 MG PO TABS: 40.0000 mg | ORAL_TABLET | Freq: Every day | ORAL | 1 refills | Status: DC

## 2021-02-01 MED ORDER — GLIPIZIDE ER 10 MG PO TB24: 10.0000 mg | ORAL_TABLET | Freq: Every day | ORAL | 1 refills | Status: DC

## 2021-02-01 MED ORDER — METFORMIN HCL 1000 MG PO TABS: 1000.0000 mg | ORAL_TABLET | Freq: Two times a day (BID) | ORAL | 1 refills | Status: DC

## 2021-02-01 NOTE — Progress Notes (Signed)
Subjective:    Patient ID: Jacob Rios, male    DOB: [birth year], 76 y.o.   MRN: [id number]   Chief Complaint: medical management of chronic issues     HPI:  Jacob Rios is a 76 y.o. who identifies as a male who was assigned male at birth.   Social history: Lives with: wife Work history: retired   Scientist, forensic in today for follow up of the following chronic medical issues:  1. Essential hypertension No c/o chest pain, SOB or headache. Does not check blood pressure at home. BP Readings from Last 3 Encounters:  01/03/21 (!) 177/52  11/01/20 106/64  10/24/20 130/60     2. Coronary atherosclerosis of autologous vein bypass graft without angina Has not seen cardiology in awhile. He has spoke with them on the phone but has not been seen in person. He has had a past stroke and currently has a loop recorder in that cardiology is monitoring  3. Gastroesophageal reflux disease without esophagitis Is on omeprazole daily and is doing well.  4. CKD stage 3 due to type 2 diabetes mellitus ([place]) No problems voiding Lab Results  Component Value Date   CREATININE 1.54 (H) 11/01/2020     5. Type 2 diabetes mellitus without complication, without long-term current use of insulin (HCC) Fasting blood sugars are running around 110-140.  No low blood sugars. Lab Results  Component Value Date   HGBA1C 5.3 11/01/2020     6. Mixed hyperlipidemia He does try to watch his diet and does very little exercise. Lab Results  Component Value Date   CHOL 108 11/01/2020   HDL 27 (L) 11/01/2020   LDLCALC 60 11/01/2020   TRIG 115 11/01/2020   CHOLHDL 4.0 11/01/2020     7. Malignant neoplasm of prostate [facility]) He currently has radiation seeds in place. Has frequent follow ups with urology.  8. Late effect CVA Only issue he has is visual problems in left eye. Has follow up in a few weeks.  9. BMI 35.0-35.9,adult No recent weight changes Wt Readings from Last 3 Encounters:  02/01/21 221  lb (100.2 kg)  11/01/20 221 lb 12.8 oz (100.6 kg)  10/24/20 235 lb (106.6 kg)   BP Readings from Last 3 Encounters:  02/01/21 127/76  01/03/21 (!) 177/52  11/01/20 106/64     New complaints: None today  Allergies  Allergen Reactions   Morphine Shortness Of Breath and Swelling   Outpatient Encounter Medications as of 02/01/2021  Medication Sig   atorvastatin (LIPITOR) 40 MG tablet Take 1 tablet (40 mg total) by mouth daily.   Cholecalciferol (D3 ADULT PO) Take 1 tablet by mouth daily.   clopidogrel (PLAVIX) 75 MG tablet Take 1 tablet (75 mg total) by mouth daily.   glipiZIDE (GLUCOTROL XL) 10 MG 24 hr tablet Take 1 tablet (10 mg total) by mouth daily.   glucose blood (ONETOUCH VERIO) test strip Test 1X per day and as needed  Dx 250.02   Lancets (ONETOUCH ULTRASOFT) lancets Patient test 1X per day and prn  Dx 250.02   lisinopril (ZESTRIL) 2.5 MG tablet Take 1 tablet (2.5 mg total) by mouth daily.   metFORMIN (GLUCOPHAGE) 1000 MG tablet Take 1 tablet (1,000 mg total) by mouth 2 (two) times daily with a meal.   metoprolol succinate (TOPROL-XL) 50 MG 24 hr tablet Take 1 tablet (50 mg total) by mouth daily. Take with or immediately following a meal.   omeprazole (PRILOSEC) 40 MG capsule Take 1 capsule (  40 mg total) by mouth daily.   vitamin C (ASCORBIC ACID) 500 MG tablet Take 500 mg by mouth daily.   No facility-administered encounter medications on file as of 02/01/2021.    Past Surgical History:  Procedure Laterality Date   APPENDECTOMY  child   CARDIAC CATHETERIZATION  09/01/2000  _0    patent RCA and Diagonal grafts, atretic LIMA, moderate nonobstructive proxLAD, normal lvsf   CARDIAC CATHETERIZATION  04-21-2003  _1    LIMA--LAD graft occluded, native LAD with nonobstructive disease,  patent diagonal/ rca grafts   CATARACT EXTRACTION     CATARACT EXTRACTION W/ INTRAOCULAR LENS IMPLANT Right 12/2018   CORONARY ANGIOPLASTY WITH STENT PLACEMENT  08/1997  _2    ptca w/ stenting  to rca   CORONARY ARTERY BYPASS GRAFT  06-06-1998  _3     LIMA -- LAD, SVG -- Diagonal,  RIMA to RCA   CYSTOSCOPY N/A 03/09/2020   Procedure: CYSTOSCOPY FLEXIBLE;  Surgeon: [name], MD;  Location[facility];  Service: Urology;  Laterality: N/A;  NO SEEDS FOUND IN BLADDER   EYE SURGERY     INGUINAL HERNIA REPAIR Right 1974   LOOP RECORDER INSERTION N/A 08/30/2020   Procedure: LOOP RECORDER INSERTION;  Surgeon: Vickie Epley, MD;  Location: [place] [facility];  Service: Cardiovascular;  Laterality: N/A;   PROSTATE BIOPSY  06/2017   x 4-5    RADIOACTIVE SEED IMPLANT N/A 03/09/2020   Procedure: RADIOACTIVE SEED IMPLANT/BRACHYTHERAPY IMPLANT;  Surgeon: [name], MD;  [facility];  Service: Urology;  Laterality: N/A;   70  SEEDS IMPLANTED   SPACE OAR INSTILLATION N/A 03/09/2020   Procedure: SPACE OAR INSTILLATION;  Surgeon: [name], MD;  Location: [facility];  Service: Urology;  Laterality: N/A;   VENTRAL HERNIA REPAIR  09/ 2000 and recurrent repair 06/ 2001    Family History  Problem Relation Age of Onset   Lung cancer Mother    Dementia Father    Hyperlipidemia Father    Congestive Heart Failure Father    Post-traumatic stress disorder Son    Alcohol abuse Maternal Grandfather    Cancer Maternal Grandfather 90       stomach cancer    Heart attack Neg Hx    Stroke Neg Hx    Breast cancer Neg Hx    Colon cancer Neg Hx    Prostate cancer Neg Hx    Pancreatic cancer Neg Hx       Controlled substance contract: n/a     Review of Systems  Constitutional:  Negative for diaphoresis.  Eyes:  Negative for pain.  Respiratory:  Negative for shortness of breath.   Cardiovascular:  Negative for chest pain, palpitations and leg swelling.  Gastrointestinal:  Negative for abdominal pain.  Endocrine: Negative for polydipsia.  Skin:  Negative for rash.  Neurological:  Negative for dizziness, weakness and headaches.   Hematological:  Does not bruise/bleed easily.  All other systems reviewed and are negative.     Objective:   Physical Exam Vitals and nursing note reviewed.  Constitutional:      Appearance: Normal appearance. He is well-developed.  HENT:     Head: Normocephalic.     Nose: Nose normal.     Mouth/Throat:     Mouth: Mucous membranes are moist.     Pharynx: Oropharynx is clear.  Eyes:     Pupils: Pupils are equal, round, and reactive to light.  Neck:     Thyroid: No  thyroid mass or thyromegaly.     Vascular: No carotid bruit or JVD.     Trachea: Phonation normal.  Cardiovascular:     Rate and Rhythm: Normal rate and regular rhythm.  Pulmonary:     Effort: Pulmonary effort is normal. No respiratory distress.     Breath sounds: Normal breath sounds.  Abdominal:     General: Bowel sounds are normal.     Palpations: Abdomen is soft.     Tenderness: There is no abdominal tenderness.  Musculoskeletal:        General: Normal range of motion.     Cervical back: Normal range of motion and neck supple.  Lymphadenopathy:     Cervical: No cervical adenopathy.  Skin:    General: Skin is warm and dry.  Neurological:     Mental Status: He is alert and oriented to person, place, and time.  Psychiatric:        Behavior: Behavior normal.        Thought Content: Thought content normal.        Judgment: Judgment normal.    BP 127/76    Pulse 65    Temp (!) 97.5 F (36.4 C) (Temporal)    Resp 20    Ht _0  (1.803 m)    Wt 221 lb (100.2 kg)    SpO2 97%    BMI 30.82 kg/m   Hgba1c 5.8%     Assessment & Plan:  ELBERT SPICKLER comes in today with chief complaint of Medical Management of Chronic Issues   Diagnosis and orders addressed:  1. Essential hypertension Low sodium diet - CBC with Differential/Platelet - CMP14+EGFR  2. Coronary atherosclerosis of autologous vein bypass graft without angina Keep follow up with cardiology  3. Gastroesophageal reflux disease without  esophagitis Avoid spicy foods Do not eat 2 hours prior to bedtime   4. CKD stage 3 due to type 2 diabetes mellitus ([place]) Labs pending  5. Type 2 diabetes mellitus without complication, without long-term current use of insulin (HCC) Continue to watch carbs in diet - Bayer DCA Hb A1c Waived - Microalbumin / creatinine urine ratio - glipiZIDE (GLUCOTROL XL) 10 MG 24 hr tablet; Take 1 tablet (10 mg total) by mouth daily.  Dispense: 90 tablet; Refill: 1 - metFORMIN (GLUCOPHAGE) 1000 MG tablet; Take 1 tablet (1,000 mg total) by mouth 2 (two) times daily with a meal.  Dispense: 180 tablet; Refill: 1  6. Mixed hyperlipidemia Low fat diet - Lipid panel - atorvastatin (LIPITOR) 40 MG tablet; Take 1 tablet (40 mg total) by mouth daily.  Dispense: 90 tablet; Refill: 1  7. Malignant neoplasm of prostate [facility]) Keep follow up with urology  8. BMI 35.0-35.9,adult Discussed diet and exercise for person with BMI >25 Will recheck weight in 3-6 months   9. Late effect of cerebrovascular accident (CVA) Keep follow up with eye doctor   Labs pending Health Maintenance reviewed Diet and exercise encouraged  Follow up plan: 6 months   [place], FNP

## 2021-02-02 LAB — MICROALBUMIN / CREATININE URINE RATIO
Creatinine, Urine: 95.2 mg/dL
Microalb/Creat Ratio: 12 mg/g creat (ref 0–29)
Microalbumin, Urine: 11.2 ug/mL

## 2021-02-18 NOTE — Progress Notes (Signed)
[facility] Note  02/20/2021     CHIEF COMPLAINT Patient presents for Retina Follow Up   HISTORY OF PRESENT ILLNESS: Jacob Rios is a 76 y.o. male who presents to the clinic today for:   HPI     Retina Follow Up   Patient presents with  CRVO/BRVO.  In left eye.  Duration of 3 months.  Since onset it is gradually improving.  I, the attending physician,  performed the HPI with the patient and updated documentation appropriately.        Comments   3 month follow up BRAO OS-  Someday's are better than others.  It has a lot to do with the light per patient.  If he closes his right eye he can see a persons eyes but the nose and mouth is blurry.  It has lightened up some.  Most of the time he feels like "a big piece of trash is in the eye" OS.   BS 121 this morning A1C 6.2      Last edited by Bernarda Caffey, MD on 02/21/2021  9:12 PM.    Pt states occasional when looking at people their mouth or neck will be missing and a grey spot will be in it's place, he feels like light helps him see better  Referring physician: [name], [place],  [place] [address]  HISTORICAL INFORMATION:   Selected notes from the MEDICAL RECORD NUMBER Referred by Dr. Marin Comment for BRAO OS LEE:  Ocular Hx- PMH-    CURRENT MEDICATIONS: No current outpatient medications on file. (Ophthalmic Drugs)   No current facility-administered medications for this visit. (Ophthalmic Drugs)   Current Outpatient Medications (Other)  Medication Sig   atorvastatin (LIPITOR) 40 MG tablet Take 1 tablet (40 mg total) by mouth daily.   Cholecalciferol (D3 ADULT PO) Take 1 tablet by mouth daily.   clopidogrel (PLAVIX) 75 MG tablet Take 1 tablet (75 mg total) by mouth daily.   glipiZIDE (GLUCOTROL XL) 10 MG 24 hr tablet Take 1 tablet (10 mg total) by mouth daily.   lisinopril (ZESTRIL) 2.5 MG tablet Take 1 tablet (2.5 mg total) by mouth daily.   metFORMIN  (GLUCOPHAGE) 1000 MG tablet Take 1 tablet (1,000 mg total) by mouth 2 (two) times daily with a meal.   metoprolol succinate (TOPROL-XL) 50 MG 24 hr tablet Take 1 tablet (50 mg total) by mouth daily. Take with or immediately following a meal.   omeprazole (PRILOSEC) 40 MG capsule Take 1 capsule (40 mg total) by mouth daily.   vitamin C (ASCORBIC ACID) 500 MG tablet Take 500 mg by mouth daily.   glucose blood (ONETOUCH VERIO) test strip Test 1X per day and as needed  Dx 250.02   Lancets (ONETOUCH ULTRASOFT) lancets Patient test 1X per day and prn  Dx 250.02   No current facility-administered medications for this visit. (Other)   REVIEW OF SYSTEMS: ROS   Positive for: Gastrointestinal, Genitourinary, Endocrine, Eyes Negative for: Constitutional, Neurological, Skin, Musculoskeletal, HENT, Cardiovascular, Respiratory, Psychiatric, Allergic/Imm, Heme/Lymph Last edited by Leonie Douglas, COA on 02/20/2021  8:41 AM.     ALLERGIES Allergies  Allergen Reactions   Morphine Shortness Of Breath and Swelling   PAST MEDICAL HISTORY Past Medical History:  Diagnosis Date   Cataract    CKD (chronic kidney disease), stage III ([place])    followed by pcp   Coronary artery disease cardiologist--- dr Angelena Form   08/ 1999  s/p  cath w/ PTCA and stenting to RCA;   05/ 2000 inferior wall MI , 06-06-1998 s/p cabg x3;   Last heart cath 2005,  2 patent grafts (diagnol and RCA) and occluded LIMA--LAD graft with normal LAD nonobstructive disease;  last nuclear study 11/ 2014 no evidence ishcemia, ef 40%   Full dentures    Hiatal hernia    History of acute inferior wall MI 05/1998   s/p  cabg   Hypertension    Hypertensive retinopathy    Mixed hyperlipidemia    Nocturia more than twice per night    PAD (peripheral artery disease) (HCC)    left common iliac artery stenosis per aorta ultrasound 03/ 2021 in epic and cardiology note   Prostate cancer [facility]) urologist--- dr Jeffie Pollock   first dx 07/ 2015 in active  survillance until bx 11-11-2019,  Stage T2a, Gleason 3+4, PSA 13.3   S/P CABG x 3 06/06/1998   LIMA--LAD, SVG to Diagonal, RIMA to RCA   S/P primary angioplasty with coronary stent 08/1997   stent to RCA   Type 2 diabetes mellitus ([place])    followed by pcp  (03-06-2020 checks blood sugar dialy in am,  fasting sugar-- 110-130)   Past Surgical History:  Procedure Laterality Date   APPENDECTOMY  child   CARDIAC CATHETERIZATION  09/01/2000  _0    patent RCA and Diagonal grafts, atretic LIMA, moderate nonobstructive proxLAD, normal lvsf   CARDIAC CATHETERIZATION  04-21-2003  _1    LIMA--LAD graft occluded, native LAD with nonobstructive disease,  patent diagonal/ rca grafts   CATARACT EXTRACTION     CATARACT EXTRACTION W/ INTRAOCULAR LENS IMPLANT Right 12/2018   CORONARY ANGIOPLASTY WITH STENT PLACEMENT  08/1997  _2    ptca w/ stenting to rca   CORONARY ARTERY BYPASS GRAFT  06-06-1998  _3     LIMA -- LAD, SVG -- Diagonal,  RIMA to RCA   CYSTOSCOPY N/A 03/09/2020   Procedure: CYSTOSCOPY FLEXIBLE;  Surgeon: [name], MD;  Location: [facility];  Service: Urology;  Laterality: N/A;  NO SEEDS FOUND IN BLADDER   EYE SURGERY     INGUINAL HERNIA REPAIR Right 1974   LOOP RECORDER INSERTION N/A 08/30/2020   Procedure: LOOP RECORDER INSERTION;  Surgeon: Vickie Epley, MD;  Location: [place] [facility];  Service: Cardiovascular;  Laterality: N/A;   PROSTATE BIOPSY  06/2017   x 4-5    RADIOACTIVE SEED IMPLANT N/A 03/09/2020   Procedure: RADIOACTIVE SEED IMPLANT/BRACHYTHERAPY IMPLANT;  Surgeon: [name], MD;  Location: [facility];  Service: Urology;  Laterality: N/A;   69  SEEDS IMPLANTED   SPACE OAR INSTILLATION N/A 03/09/2020   Procedure: SPACE OAR INSTILLATION;  Surgeon: [name], MD;  Location: [facility];  Service: Urology;  Laterality: N/A;   VENTRAL HERNIA REPAIR  09/ 2000 and recurrent repair 06/ 2001    FAMILY HISTORY Family  History  Problem Relation Age of Onset   Lung cancer Mother    Dementia Father    Hyperlipidemia Father    Congestive Heart Failure Father    Post-traumatic stress disorder Son    Alcohol abuse Maternal Grandfather    Cancer Maternal Grandfather 90       stomach cancer    Heart attack Neg Hx    Stroke Neg Hx    Breast cancer Neg Hx    Colon cancer Neg Hx    Prostate cancer Neg Hx    Pancreatic cancer Neg Hx  SOCIAL HISTORY Social History   Tobacco Use   Smoking status: Former    Packs/day: 4.00    Years: 45.00    Pack years: 180.00    Types: Cigarettes    Quit date: 01/13/1997    Years since quitting: 24.1   Smokeless tobacco: Never   Tobacco comments:    reports he began smoking at age 20  Vaping Use   Vaping Use: Never used  Substance Use Topics   Alcohol use: No   Drug use: Never     There were no vitals filed for this visit.     OPHTHALMIC EXAM:  Base Eye Exam     Visual Acuity (Snellen - Linear)       Right Left   Dist [place] 20/40 -2 20/50 -2   Dist ph [place] 20/30 +2 20/40 +2  OS- unable to see the first 1-2 letters on some lines        Tonometry (Tonopen, 8:48 AM)       Right Left   Pressure 15 12         Pupils       Dark Light Shape React APD   Right 4 3 Round Brisk None   Left 4 3 Round Brisk None         Visual Fields (Counting fingers)       Left Right    Full Full         Extraocular Movement       Right Left    Full Full         Neuro/Psych     Oriented x3: Yes   Mood/Affect: Normal         Dilation     Both eyes: 1.0% Mydriacyl, 2.5% Phenylephrine @ 8:49 AM           Slit Lamp and Fundus Exam     Slit Lamp Exam       Right Left   Lids/Lashes Dermatochalasis - upper lid Dermatochalasis - upper lid   Conjunctiva/Sclera nasal and temporal pinguecula nasal and temporal pinguecula   Cornea trace PEE, arcus well healed cataract wound   Anterior Chamber deep, clear, narrow temporal angle deep, clear,  narrow temporal angle   Iris Round and dilated, mild PPM inferiorly Round and dilated, mild PPM inferiorly, No NVI   Lens 3+ Nuclear sclerosis with brunescence, 2-3+ Cortical cataract, trace Posterior subcapsular cataract PC IOL in good position   Anterior Vitreous Vitreous syneresis Vitreous syneresis         Fundus Exam       Right Left   Disc Pink and Sharp mild Pallor, Sharp rim, temporal PPP   C/D Ratio 0.5 0.6   Macula Flat, Blunted foveal reflex, No heme or edema Flat, good foveal reflex, mild atrophy superior mac, no heme   Vessels attenuated, Tortuous mild attenuation, mild tortuousity, mild Copper wiring   Periphery Attached, No heme  Attached, few DBH superior periphery - improving            IMAGING AND PROCEDURES  Imaging and Procedures for 02/20/2021  OCT, Retina - OU - Both Eyes       Right Eye Quality was good. Central Foveal Thickness: 296. Progression has been stable. Findings include normal foveal contour, no IRF, no SRF, vitreomacular adhesion .   Left Eye Quality was good. Central Foveal Thickness: 267. Progression has improved. Findings include no IRF, no SRF, intraretinal hyper-reflective material, vitreomacular adhesion , inner retinal atrophy,  abnormal foveal contour (inner retinal hyper reflectivity superior macula - improving, progression of inner retinal atrophy superior macula, partial PVD).   Notes *Images captured and stored on drive  Diagnosis / Impression:  OD: NFP, no IRF/SRF OS: BRAO affecting superior macula and fovea -- inner retinal hyper reflectivity superior macula - improving, progression of inner retinal atrophy superior macula  Clinical management:  See below  Abbreviations: NFP - Normal foveal profile. CME - cystoid macular edema. PED - pigment epithelial detachment. IRF - intraretinal fluid. SRF - subretinal fluid. EZ - ellipsoid zone. ERM - epiretinal membrane. ORA - outer retinal atrophy. ORT - outer retinal tubulation. SRHM -  subretinal hyper-reflective material. IRHM - intraretinal hyper-reflective material            ASSESSMENT/PLAN:    ICD-10-CM   1. BRAO (branch retinal artery occlusion), left  H34.232 OCT, Retina - OU - Both Eyes    2. Essential hypertension  I10     3. Hypertensive retinopathy of both eyes  H35.033     4. Combined forms of age-related cataract of right eye  H25.811     5. Pseudophakia  Z96.1      1,2. BRAO OS - pt reports history of painless inferior visual field loss OS -- onset August 15 - exam shows mild retinal whitening of superior macula and fovea; no emboli noted - BCVA 20/40 (PH), IOP 10 - OCT shows evolving BRAO effecting superior macula and fovea -- decrease in inner retinal hyper-reflectivity and progression of inner retinal atrophy - FA (08.16.22) shows severely delayed filling time, but eventual perfusion of all retinal vessels OS - ROS for GCA negative -- no headache, jaw claudication, scalp tenderness, numbness/tingling - pt was sent to ED for stroke work up on 8.16.22 -- admitted and MRI showed L parietal stroke -- started on plavix - exam today shows superior macular atrophy - no neovascularization; no NVG or elevated IOP - no retinal or ophthalmic intervention indicated or recommended at this time - F/U 6-9 months -- DFE/OCT  3,4. Hypertensive retinopathy OU - discussed importance of tight BP control - monitor   5. Mixed Cataract OD - The symptoms of cataract, surgical options, and treatments and risks were discussed with patient. - discussed diagnosis and progression - approaching visual significance - pt plans to follow up with Dr. Brigitte Pulse - monitor for now  6. Pseudophakia OS  - s/p CE/IOL (Dr. Brigitte Pulse, [facility], Dec. 2020)  - IOL in good position, doing well  - monitor   Ophthalmic Meds Ordered this visit:  No orders of the defined types were placed in this encounter.    Return for f/u 6-9 months, BRAO OS, DFE, OCT.  There are no Patient  Instructions on file for this visit.   Explained the diagnoses, plan, and follow up with the patient and they expressed understanding.  Patient expressed understanding of the importance of proper follow up care.   This document serves as a record of services personally performed by Gardiner Sleeper, MD, PhD. It was created on their behalf by Orvan Falconer, an ophthalmic technician. The creation of this record is the provider's dictation and/or activities during the visit.    Electronically signed by: Orvan Falconer, OA, 02/21/21  9:13 PM  This document serves as a record of services personally performed by Gardiner Sleeper, MD, PhD. It was created on their behalf by San Jetty. Owens Shark, OA an ophthalmic technician. The creation of this record is the provider's dictation and/or activities  during the visit.    Electronically signed by: San Jetty. Marguerita Merles 02.08.2023 9:13 PM  Gardiner Sleeper, M.D., Ph.D. Diseases & Surgery of the Retina and Vitreous [facility]  I have reviewed the above documentation for accuracy and completeness, and I agree with the above. Gardiner Sleeper, M.D., Ph.D. 02/21/21 9:16 PM   Abbreviations: M myopia (nearsighted); A astigmatism; H hyperopia (farsighted); P presbyopia; Mrx spectacle prescription;  CTL contact lenses; OD right eye; OS left eye; OU both eyes  XT exotropia; ET esotropia; PEK punctate epithelial keratitis; PEE punctate epithelial erosions; DES dry eye syndrome; MGD meibomian gland dysfunction; ATs artificial tears; PFAT's preservative free artificial tears; [place] nuclear sclerotic cataract; PSC posterior subcapsular cataract; ERM epi-retinal membrane; PVD posterior vitreous detachment; RD retinal detachment; DM diabetes mellitus; DR diabetic retinopathy; NPDR non-proliferative diabetic retinopathy; PDR proliferative diabetic retinopathy; CSME clinically significant macular edema; DME diabetic macular edema; dbh dot blot hemorrhages; CWS  cotton wool spot; POAG primary open angle glaucoma; C/D cup-to-disc ratio; HVF humphrey visual field; GVF goldmann visual field; OCT optical coherence tomography; IOP intraocular pressure; BRVO Branch retinal vein occlusion; CRVO central retinal vein occlusion; CRAO central retinal artery occlusion; BRAO branch retinal artery occlusion; RT retinal tear; SB scleral buckle; PPV pars plana vitrectomy; VH Vitreous hemorrhage; PRP panretinal laser photocoagulation; IVK intravitreal kenalog; VMT vitreomacular traction; MH Macular hole;  NVD neovascularization of the disc; NVE neovascularization elsewhere; AREDS age related eye disease study; ARMD age related macular degeneration; POAG primary open angle glaucoma; EBMD epithelial/anterior basement membrane dystrophy; ACIOL anterior chamber intraocular lens; IOL intraocular lens; PCIOL posterior chamber intraocular lens; Phaco/IOL phacoemulsification with intraocular lens placement; [place] photorefractive keratectomy; LASIK laser assisted in situ keratomileusis; HTN hypertension; DM diabetes mellitus; COPD chronic obstructive pulmonary disease

## 2021-02-20 ENCOUNTER — Other Ambulatory Visit: Payer: Self-pay

## 2021-02-20 ENCOUNTER — Ambulatory Visit (INDEPENDENT_AMBULATORY_CARE_PROVIDER_SITE_OTHER): Payer: Medicare PPO | Admitting: Ophthalmology

## 2021-02-20 ENCOUNTER — Encounter (INDEPENDENT_AMBULATORY_CARE_PROVIDER_SITE_OTHER): Payer: Self-pay | Admitting: Ophthalmology

## 2021-02-20 DIAGNOSIS — H35033 Hypertensive retinopathy, bilateral: Secondary | ICD-10-CM | POA: Diagnosis not present

## 2021-02-20 DIAGNOSIS — I10 Essential (primary) hypertension: Secondary | ICD-10-CM | POA: Diagnosis not present

## 2021-02-20 DIAGNOSIS — H25811 Combined forms of age-related cataract, right eye: Secondary | ICD-10-CM | POA: Diagnosis not present

## 2021-02-20 DIAGNOSIS — H34232 Retinal artery branch occlusion, left eye: Secondary | ICD-10-CM | POA: Diagnosis not present

## 2021-02-20 DIAGNOSIS — Z961 Presence of intraocular lens: Secondary | ICD-10-CM | POA: Diagnosis not present

## 2021-02-21 ENCOUNTER — Encounter (INDEPENDENT_AMBULATORY_CARE_PROVIDER_SITE_OTHER): Payer: Self-pay | Admitting: Ophthalmology

## 2021-02-23 LAB — CUP PACEART REMOTE DEVICE CHECK
Date Time Interrogation Session: 20230210230648
Implantable Pulse Generator Implant Date: 20220818

## 2021-02-25 ENCOUNTER — Ambulatory Visit (INDEPENDENT_AMBULATORY_CARE_PROVIDER_SITE_OTHER): Payer: Medicare PPO

## 2021-02-25 DIAGNOSIS — I639 Cerebral infarction, unspecified: Secondary | ICD-10-CM | POA: Diagnosis not present

## 2021-02-27 NOTE — Progress Notes (Signed)
[facility] Summary Report / Loop Recorder 

## 2021-03-14 NOTE — Progress Notes (Signed)
Chief Complaint  Patient presents with   Follow-up    CAD     History of Present Illness: 76 yo male with history of CAD s/p CABG 2000, HTN, HLD, DM, CVA, prostate cancer and former tobacco abuse here today for cardiac follow up. In 2000 he had an MI with subsequent bypass surgery (last cath 2005 with occluded LIMA to LAD-native LAD open, patent SVG to diagonal and patent RIMA to RCA). He quit smoking in 1999. Stress myoview 12/01/12 with no ischemia. Given his risk factors and previous tobacco use.  He underwent screening for abdominal aortic aneurysm 03/2019. There was mild ectasia of the abdominal aorta at 2.9 cm.  In addition, there was evidence of significant left common iliac artery stenosis with peak velocity of 398. Seen by Dr. Fletcher Anon 04/2019. No claudication symptoms and normal distal pulses. Recommended yearly screening tests. He was admitted to [facility] August 2022 with a stroke. Echo August 2022 with LVEF=50-55%, moderate LVH, trivial MR. Mild aortic stenosis. A loop recorder was placed. He was diagnosed with prostate cancer in February 2022 and has been treated with radiation seeds.   He is here today for follow up. The patient denies any chest pain, dyspnea, palpitations, lower extremity edema, orthopnea, PND, dizziness, near syncope or syncope. No leg pain with ambulation. He is mostly recovered from his stroke but still has some visual trouble in on eye.   Primary Care Physician: [name], FNP  Past Medical History:  Diagnosis Date   Cataract    CKD (chronic kidney disease), stage III ([place])    followed by pcp   Coronary artery disease cardiologist--- dr Angelena Form   08/ 1999  s/p  cath w/ PTCA and stenting to RCA;   05/ 2000 inferior wall MI , 06-06-1998 s/p cabg x3;   Last heart cath 2005,  2 patent grafts (diagnol and RCA) and occluded LIMA--LAD graft with normal LAD nonobstructive disease;  last nuclear study 11/ 2014 no evidence ishcemia, ef 40%   Full dentures     Hiatal hernia    History of acute inferior wall MI 05/1998   s/p  cabg   Hypertension    Hypertensive retinopathy    Mixed hyperlipidemia    Nocturia more than twice per night    PAD (peripheral artery disease) (HCC)    left common iliac artery stenosis per aorta ultrasound 03/ 2021 in epic and cardiology note   Prostate cancer [facility]) urologist--- dr Jeffie Pollock   first dx 07/ 2015 in active survillance until bx 11-11-2019,  Stage T2a, Gleason 3+4, PSA 13.3   S/P CABG x 3 06/06/1998   LIMA--LAD, SVG to Diagonal, RIMA to RCA   S/P primary angioplasty with coronary stent 08/1997   stent to RCA   Type 2 diabetes mellitus ([place])    followed by pcp  (03-06-2020 checks blood sugar dialy in am,  fasting sugar-- 110-130)    Past Surgical History:  Procedure Laterality Date   APPENDECTOMY  child   CARDIAC CATHETERIZATION  09/01/2000  @[facility]    patent RCA and Diagonal grafts, atretic LIMA, moderate nonobstructive proxLAD, normal lvsf   CARDIAC CATHETERIZATION  04-21-2003  @[facility]    LIMA--LAD graft occluded, native LAD with nonobstructive disease,  patent diagonal/ rca grafts   CATARACT EXTRACTION     CATARACT EXTRACTION W/ INTRAOCULAR LENS IMPLANT Right 12/2018   CORONARY ANGIOPLASTY WITH STENT PLACEMENT  08/1997  @[facility]    ptca w/ stenting to rca   CORONARY ARTERY BYPASS GRAFT  06-06-1998  @  [facility]    LIMA -- LAD, SVG -- Diagonal,  RIMA to RCA   CYSTOSCOPY N/A 03/09/2020   Procedure: CYSTOSCOPY FLEXIBLE;  Surgeon: [name], MD;  Location[facility];  Service: Urology;  Laterality: N/A;  NO SEEDS FOUND IN BLADDER   EYE SURGERY     INGUINAL HERNIA REPAIR Right 1974   LOOP RECORDER INSERTION N/A 08/30/2020   Procedure: LOOP RECORDER INSERTION;  Surgeon: Vickie Epley, MD;  Location: [facility];  Service: Cardiovascular;  Laterality: N/A;   PROSTATE BIOPSY  06/2017   x 4-5    RADIOACTIVE SEED IMPLANT N/A 03/09/2020   Procedure: RADIOACTIVE SEED IMPLANT/BRACHYTHERAPY IMPLANT;   Surgeon: [name], MD;  Location: [facility];  Service: Urology;  Laterality: N/A;   70  SEEDS IMPLANTED   SPACE OAR INSTILLATION N/A 03/09/2020   Procedure: SPACE OAR INSTILLATION;  Surgeon: [name], MD;  Location[facility];  Service: Urology;  Laterality: N/A;   VENTRAL HERNIA REPAIR  09/ 2000 and recurrent repair 06/ 2001    Current Outpatient Medications  Medication Sig Dispense Refill   atorvastatin (LIPITOR) 40 MG tablet Take 1 tablet (40 mg total) by mouth daily. 90 tablet 1   Cholecalciferol (D3 ADULT PO) Take 1 tablet by mouth daily.     clopidogrel (PLAVIX) 75 MG tablet Take 1 tablet (75 mg total) by mouth daily. 30 tablet 5   glipiZIDE (GLUCOTROL XL) 10 MG 24 hr tablet Take 1 tablet (10 mg total) by mouth daily. 90 tablet 1   glucose blood (ONETOUCH VERIO) test strip Test 1X per day and as needed  Dx 250.02 100 each 12   Lancets (ONETOUCH ULTRASOFT) lancets Patient test 1X per day and prn  Dx 250.02 100 each 12   lisinopril (ZESTRIL) 2.5 MG tablet Take 1 tablet (2.5 mg total) by mouth daily. 90 tablet 1   metFORMIN (GLUCOPHAGE) 1000 MG tablet Take 1 tablet (1,000 mg total) by mouth 2 (two) times daily with a meal. 180 tablet 1   metoprolol succinate (TOPROL-XL) 50 MG 24 hr tablet Take 1 tablet (50 mg total) by mouth daily. Take with or immediately following a meal. 90 tablet 1   omeprazole (PRILOSEC) 40 MG capsule Take 1 capsule (40 mg total) by mouth daily. 90 capsule 1   vitamin C (ASCORBIC ACID) 500 MG tablet Take 500 mg by mouth daily.     No current facility-administered medications for this visit.    Allergies  Allergen Reactions   Morphine Shortness Of Breath and Swelling    Social History   Socioeconomic History   Marital status: Married    Spouse name: Vaughan Basta    Number of children: 3   Years of education: Not on file   Highest education level: Not on file  Occupational History   Occupation: part-time trucker    Comment:  retired   Tobacco Use   Smoking status: Former    Packs/day: 4.00    Years: 45.00    Pack years: 180.00    Types: Cigarettes    Quit date: 01/13/1997    Years since quitting: 24.1   Smokeless tobacco: Never   Tobacco comments:    reports he began smoking at age 37  Vaping Use   Vaping Use: Never used  Substance and Sexual Activity   Alcohol use: No   Drug use: Never   Sexual activity: Not on file  Other Topics Concern   Not on file  Social History Narrative   Lives home with wife   Social Determinants of Health   Financial Resource Strain: Low Risk    Difficulty of Paying Living Expenses: Not very hard  Food Insecurity: No Food Insecurity   Worried About Charity fundraiser in the Last Year: Never true   Ran Out of Food in the Last Year: Never true  Transportation Needs: No Transportation Needs   Lack of Transportation (Medical): No   Lack of Transportation (Non-Medical): No  Physical Activity: Sufficiently Active   Days of Exercise per Week: 7 days   Minutes of Exercise per Session: 30 min  Stress: No Stress Concern Present   Feeling of Stress : Only a little  Social Connections: Engineer, building services of Communication with Friends and Family: More than three times a week   Frequency of Social Gatherings with Friends and Family: More than three times a week   Attends Religious Services: More than 4 times per year   Active Member of [employer] or Organizations: Yes   Attends Music therapist: More than 4 times per year   Marital Status: Married  Human resources officer Violence: Not At Risk   Fear of Current or Ex-Partner: No   Emotionally Abused: No   Physically Abused: No   Sexually Abused: No    Family History  Problem Relation Age of Onset   Lung cancer Mother    Dementia Father    Hyperlipidemia Father    Congestive Heart Failure Father    Post-traumatic stress disorder Son    Alcohol abuse Maternal Grandfather    Cancer Maternal Grandfather 90        stomach cancer    Heart attack Neg Hx    Stroke Neg Hx    Breast cancer Neg Hx    Colon cancer Neg Hx    Prostate cancer Neg Hx    Pancreatic cancer Neg Hx     Review of Systems:  As stated in the HPI and otherwise negative.   BP 136/70    Pulse 62    Ht 5\' 11"  (1.803 m)    Wt 227 lb (103 kg)    SpO2 95%    BMI 31.66 kg/m   Physical Examination: General: Well developed, well nourished, NAD  HEENT: OP clear, mucus membranes moist  SKIN: warm, dry. No rashes. Neuro: No focal deficits  Musculoskeletal: Muscle strength 5/5 all ext  Psychiatric: Mood and affect normal  Neck: No JVD, no carotid bruits, no thyromegaly, no lymphadenopathy.  Lungs:Clear bilaterally, no wheezes, rhonci, crackles Cardiovascular: Regular rate and rhythm. No murmurs, gallops or rubs. Abdomen:Soft. Bowel sounds present. Non-tender.  Extremities: No lower extremity edema. Pulses are 2 + in the bilateral DP/PT.  EKG:  EKG is not ordered today. The ekg ordered today demonstrates   Echo 08/28/20:  1. Left ventricular ejection fraction, by estimation, is 50 to 55%. The  left ventricle has low normal function. The left ventricle has no regional  wall motion abnormalities. There is moderate left ventricular hypertrophy.  Left ventricular diastolic  parameters were normal.   2. Right ventricular systolic function is normal. The right ventricular  size is normal.   3. Left atrial size was mildly dilated.   4. The mitral valve is normal in structure. Trivial mitral valve  regurgitation. No evidence of mitral stenosis.   5. Very mild AS with AVA 1.87 and mean gradient only 6 peak 11 mmhg no  obvious sub aortic web .  The aortic valve is tricuspid. There is moderate  calcification of the aortic valve. There is moderate thickening of the  aortic valve. Aortic valve  regurgitation is not visualized. Mild aortic valve stenosis.   6. Aortic dilatation noted. There is mild dilatation of the aortic root,   measuring 39 mm.   7. The inferior vena cava is normal in size with greater than 50%  respiratory variability, suggesting right atrial pressure of 3 mmHg.   Recent Labs: 02/01/2021: ALT 11; BUN 21; Creatinine, Ser 1.51; Hemoglobin 13.6; Platelets 189; Potassium 4.8; Sodium 140   Lipid Panel    Component Value Date/Time   CHOL 116 02/01/2021 0911   CHOL 107 07/30/2012 1158   TRIG 83 02/01/2021 0911   TRIG 96 07/30/2012 1158   HDL 31 (L) 02/01/2021 0911   HDL 24 (L) 07/30/2012 1158   CHOLHDL 3.7 02/01/2021 0911   CHOLHDL 3.9 08/28/2020 1607   VLDL 18 08/28/2020 1607   LDLCALC 68 02/01/2021 0911   LDLCALC 64 07/30/2012 1158     Wt Readings from Last 3 Encounters:  03/15/21 227 lb (103 kg)  02/01/21 221 lb (100.2 kg)  11/01/20 221 lb 12.8 oz (100.6 kg)     Other studies Reviewed: Additional studies/ records that were reviewed today include: . Review of the above records demonstrates:    Assessment and Plan:   1. CAD without angina: He has no chest pain. LV function normal by echo in 2022. Continue Plavix, beta blocker and statin.    2. HTN: BP is controlled. No changes  3. Hyperlipidemia:  LDL at goal in January 2023. Continue statin.    4. History of CVA: Loop recorder in place. Continue Plavix  5. PAD: No claudication. He was called last week about rescheduling his ABI. He is due for f/u with Dr. Fletcher Anon.   Current medicines are reviewed at length with the patient today.  The patient does not have concerns regarding medicines.  The following changes have been made:  no change  Labs/ tests ordered today include:   No orders of the defined types were placed in this encounter.  Disposition:   F/U with me in 12  months   Signed, Lauree Chandler, MD 03/15/2021 11:38 AM    [facility] [address], [place], [place]  [address] Phone: [phone]; Fax: [phone]

## 2021-03-15 ENCOUNTER — Encounter: Payer: Self-pay | Admitting: Cardiovascular Disease

## 2021-03-15 ENCOUNTER — Ambulatory Visit: Payer: Medicare PPO | Admitting: Cardiovascular Disease

## 2021-03-15 ENCOUNTER — Other Ambulatory Visit: Payer: Self-pay

## 2021-03-15 VITALS — BP 136/70 | HR 62 | Ht 71.0 in | Wt 227.0 lb

## 2021-03-15 DIAGNOSIS — I2510 Atherosclerotic heart disease of native coronary artery without angina pectoris: Secondary | ICD-10-CM | POA: Diagnosis not present

## 2021-03-15 DIAGNOSIS — I10 Essential (primary) hypertension: Secondary | ICD-10-CM

## 2021-03-15 DIAGNOSIS — E782 Mixed hyperlipidemia: Secondary | ICD-10-CM

## 2021-03-15 DIAGNOSIS — I739 Peripheral vascular disease, unspecified: Secondary | ICD-10-CM | POA: Diagnosis not present

## 2021-03-15 NOTE — Patient Instructions (Signed)

## 2021-03-17 ENCOUNTER — Other Ambulatory Visit: Payer: Self-pay | Admitting: Nurse Practitioner

## 2021-03-17 DIAGNOSIS — K219 Gastro-esophageal reflux disease without esophagitis: Secondary | ICD-10-CM

## 2021-03-17 DIAGNOSIS — I10 Essential (primary) hypertension: Secondary | ICD-10-CM

## 2021-03-19 ENCOUNTER — Other Ambulatory Visit: Payer: Self-pay | Admitting: *Deleted

## 2021-04-04 ENCOUNTER — Other Ambulatory Visit: Payer: Medicare PPO

## 2021-04-23 ENCOUNTER — Telehealth: Payer: Self-pay | Admitting: Nurse Practitioner

## 2021-04-23 ENCOUNTER — Ambulatory Visit (INDEPENDENT_AMBULATORY_CARE_PROVIDER_SITE_OTHER): Payer: Medicare PPO

## 2021-04-23 DIAGNOSIS — I7143 Infrarenal abdominal aortic aneurysm, without rupture: Secondary | ICD-10-CM | POA: Diagnosis not present

## 2021-04-23 DIAGNOSIS — I714 Abdominal aortic aneurysm, without rupture, unspecified: Secondary | ICD-10-CM

## 2021-04-23 NOTE — Telephone Encounter (Signed)
Patient wants to know when he needs a tetanus shot. Please call back and advise.  ?

## 2021-04-23 NOTE — Telephone Encounter (Signed)
Patient called and advised understanding not due till July of 2024 ?

## 2021-04-23 NOTE — Telephone Encounter (Signed)
Left detailed message on patients voicemail that tetanus up to date ?

## 2021-04-24 ENCOUNTER — Ambulatory Visit: Payer: Medicare PPO | Admitting: Family Medicine

## 2021-04-24 ENCOUNTER — Other Ambulatory Visit: Payer: Self-pay

## 2021-04-24 ENCOUNTER — Encounter: Payer: Self-pay | Admitting: Family Medicine

## 2021-04-24 VITALS — BP 116/70 | HR 69 | Ht 71.0 in | Wt 225.0 lb

## 2021-04-24 DIAGNOSIS — T148XXA Other injury of unspecified body region, initial encounter: Secondary | ICD-10-CM

## 2021-04-24 DIAGNOSIS — S41102A Unspecified open wound of left upper arm, initial encounter: Secondary | ICD-10-CM | POA: Diagnosis not present

## 2021-04-24 DIAGNOSIS — I714 Abdominal aortic aneurysm, without rupture, unspecified: Secondary | ICD-10-CM

## 2021-04-24 NOTE — Progress Notes (Signed)
? ?Acute Office Visit ? ?Subjective:  ? ? Patient ID: Jacob Rios, male    DOB: 1945/06/13, 76 y.o.   MRN: [id number] ? ?Chief Complaint  ?Patient presents with  ? Wound Check  ?  Left arm/ Mowing and scraped on thorn bush.  ? ? ?HPI ?Patient is in today for a wound check. He has an wound on his left forearm that has been present for 1 week. It occurred when he was mowing under a thorn bush and was scrap by a thorn. Another scrap occurred from the same thorn bush a few days ago as well in the same location. He has been using neosporin and peroxide to the area. He did have a bandaid on the area but this caused discoloration to his skin when he took it off. He has had some scant bleeding but denies drainage otherwise. Denies fever. It is tender.  ? ?Past Medical History:  ?Diagnosis Date  ? Cataract   ? CKD (chronic kidney disease), stage III ([place])   ? followed by pcp  ? Coronary artery disease cardiologist--- dr Angelena Form  ? 08/ 1999  s/p  cath w/ PTCA and stenting to RCA;   05/ 2000 inferior wall MI , 06-06-1998 s/p cabg x3;   Last heart cath 2005,  2 patent grafts (diagnol and RCA) and occluded LIMA--LAD graft with normal LAD nonobstructive disease;  last nuclear study 11/ 2014 no evidence ishcemia, ef 40%  ? Full dentures   ? Hiatal hernia   ? History of acute inferior wall MI 05/1998  ? s/p  cabg  ? Hypertension   ? Hypertensive retinopathy   ? Mixed hyperlipidemia   ? Nocturia more than twice per night   ? PAD (peripheral artery disease) ([place])   ? left common iliac artery stenosis per aorta ultrasound 03/ 2021 in epic and cardiology note  ? Prostate cancer [facility]) urologist--- dr Jeffie Pollock  ? first dx 07/ 2015 in active survillance until bx 11-11-2019,  Stage T2a, Gleason 3+4, PSA 13.3  ? S/P CABG x 3 06/06/1998  ? LIMA--LAD, SVG to Diagonal, RIMA to RCA  ? S/P primary angioplasty with coronary stent 08/1997  ? stent to RCA  ? Type 2 diabetes mellitus ([place])   ? followed by pcp  (03-06-2020 checks blood sugar dialy in  am,  fasting sugar-- 110-130)  ? ? ?Past Surgical History:  ?Procedure Laterality Date  ? APPENDECTOMY  child  ? CARDIAC CATHETERIZATION  09/01/2000  '@[facility]'$   ? patent RCA and Diagonal grafts, atretic LIMA, moderate nonobstructive proxLAD, normal lvsf  ? CARDIAC CATHETERIZATION  04-21-2003  '@[facility]'$   ? LIMA--LAD graft occluded, native LAD with nonobstructive disease,  patent diagonal/ rca grafts  ? CATARACT EXTRACTION    ? CATARACT EXTRACTION W/ INTRAOCULAR LENS IMPLANT Right 12/2018  ? CORONARY ANGIOPLASTY WITH STENT PLACEMENT  08/1997  '@[facility]'$   ? ptca w/ stenting to rca  ? CORONARY ARTERY BYPASS GRAFT  06-06-1998  '@[facility]'$    ? LIMA -- LAD, SVG -- Diagonal,  RIMA to RCA  ? CYSTOSCOPY N/A 03/09/2020  ? Procedure: CYSTOSCOPY FLEXIBLE;  Surgeon: [name], MD;  Location: [facility];  Service: Urology;  Laterality: N/A;  NO SEEDS FOUND IN BLADDER  ? EYE SURGERY    ? INGUINAL HERNIA REPAIR Right 1974  ? LOOP RECORDER INSERTION N/A 08/30/2020  ? Procedure: LOOP RECORDER INSERTION;  Surgeon: Vickie Epley, MD;  Location: [place] [facility];  Service: Cardiovascular;  Laterality: N/A;  ? PROSTATE  BIOPSY  06/2017  ? x 4-5   ? RADIOACTIVE SEED IMPLANT N/A 03/09/2020  ? Procedure: RADIOACTIVE SEED IMPLANT/BRACHYTHERAPY IMPLANT;  Surgeon: [name], MD;  Location: [facility];  Service: Urology;  Laterality: N/A;   70  SEEDS IMPLANTED  ? SPACE OAR INSTILLATION N/A 03/09/2020  ? Procedure: SPACE OAR INSTILLATION;  Surgeon: [name], MD;  Location: [facility];  Service: Urology;  Laterality: N/A;  ? VENTRAL HERNIA REPAIR  09/ 2000 and recurrent repair 06/ 2001  ? ? ?Family History  ?Problem Relation Age of Onset  ? Lung cancer Mother   ? Dementia Father   ? Hyperlipidemia Father   ? Congestive Heart Failure Father   ? Post-traumatic stress disorder Son   ? Alcohol abuse Maternal Grandfather   ? Cancer Maternal Grandfather 90  ?     stomach cancer   ? Heart attack Neg Hx   ? Stroke Neg Hx    ? Breast cancer Neg Hx   ? Colon cancer Neg Hx   ? Prostate cancer Neg Hx   ? Pancreatic cancer Neg Hx   ? ? ?Social History  ? ?Socioeconomic History  ? Marital status: Married  ?  Spouse name: Vaughan Basta   ? Number of children: 3  ? Years of education: Not on file  ? Highest education level: Not on file  ?Occupational History  ? 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 Smoking status: Former  ?  Packs/day: 4.00  ?  Years: 45.00  ?  Pack years: 180.00  ?  Types: Cigarettes  ?  Quit date: 01/13/1997  ?  Years since quitting: 24.2  ? Smokeless tobacco: Never  ? Tobacco comments:  ?  reports he began smoking at age 44  ?Vaping Use  ? Vaping Use: Never used  ?Substance and Sexual Activity  ? Alcohol use: No  ? Drug use: Never  ? Sexual activity: Not on file  ?Other Topics Concern  ? Not on file  ?Social History Narrative  ? Lives home with wife  ? ?Social Determinants of Health  ? ?Financial Resource Strain: Low Risk   ? Difficulty of Paying Living Expenses: Not very hard  ?Food Insecurity: No Food Insecurity  ? Worried About Charity fundraiser in the Last Year: Never true  ? Ran Out of Food in the Last Year: Never true  ?Transportation Needs: No Transportation Needs  ? Lack of Transportation (Medical): No  ? Lack of Transportation (Non-Medical): No  ?Physical Activity: Sufficiently Active  ? Days of Exercise per Week: 7 days  ? Minutes of Exercise per Session: 30 min  ?Stress: No Stress Concern Present  ? Feeling of Stress : Only a little  ?Social Connections: Socially Integrated  ? Frequency of Communication with Friends and Family: More than three times a week  ? Frequency of Social Gatherings with Friends and Family: More than three times a week  ? Attends Religious Services: More than 4 times per year  ? Active Member of Clubs or Organizations: Yes  ? Attends Archivist Meetings: More than 4 times per year  ? Marital Status: Married  ?Intimate Partner Violence: Not At Risk  ? Fear  of Current or Ex-Partner: No  ? Emotionally Abused: No  ? Physically Abused: No  ? Sexually Abused: No  ? ? ?Outpatient Medications Prior to Visit  ?Medication Sig Dispense Refill  ? atorvastatin (LIPITOR) 40 MG tablet  Take 1 tablet (40 mg total) by mouth daily. 90 tablet 1  ? Cholecalciferol (D3 ADULT PO) Take 1 tablet by mouth daily.    ? clopidogrel (PLAVIX) 75 MG tablet Take 1 tablet (75 mg total) by mouth daily. 30 tablet 5  ? glipiZIDE (GLUCOTROL XL) 10 MG 24 hr tablet Take 1 tablet (10 mg total) by mouth daily. 90 tablet 1  ? glucose blood (ONETOUCH VERIO) test strip Test 1X per day and as needed  Dx 250.02 100 each 12  ? Lancets (ONETOUCH ULTRASOFT) lancets Patient test 1X per day and prn  Dx 250.02 100 each 12  ? lisinopril (ZESTRIL) 2.5 MG tablet Take 1 tablet (2.5 mg total) by mouth daily. 90 tablet 1  ? metFORMIN (GLUCOPHAGE) 1000 MG tablet Take 1 tablet (1,000 mg total) by mouth 2 (two) times daily with a meal. 180 tablet 1  ? metoprolol succinate (TOPROL-XL) 50 MG 24 hr tablet TAKE 1 TABLET BY MOUTH ONCE DAILY WITH OR IMMEDIATELY FOLLOWING A MEAL 90 tablet 1  ? omeprazole (PRILOSEC) 40 MG capsule Take 1 capsule by mouth once daily 90 capsule 1  ? vitamin C (ASCORBIC ACID) 500 MG tablet Take 500 mg by mouth daily.    ? ?No facility-administered medications prior to visit.  ? ? ?Allergies  ?Allergen Reactions  ? Morphine Shortness Of Breath and Swelling  ? ? ?Review of Systems ?As per HPI.  ?   ?Objective:  ?  ?Physical Exam ?Vitals and nursing note reviewed.  ?Constitutional:   ?   General: He is not in acute distress. ?   Appearance: He is not ill-appearing, toxic-appearing or diaphoretic.  ?Pulmonary:  ?   Effort: Pulmonary effort is normal. No respiratory distress.  ?Skin: ?   General: Skin is warm and dry.  ?   Findings: Abrasion (2 cm round round abrasion to left forearm. No drainage present. No surrounding erythema. No swelling) present.  ?Neurological:  ?   Mental Status: He is alert and oriented  to person, place, and time.  ?Psychiatric:     ?   Mood and Affect: Mood normal.  ? ? ?There were no vitals taken for this visit. ?Wt Readings from Last 3 Encounters:  ?03/15/21 227 lb (103 kg)  ?01

## 2021-04-24 NOTE — Patient Instructions (Signed)
Laceration Care, Adult A laceration is a cut that may go through all layers of the skin and into the tissue that is right under the skin. Some lacerations heal on their own. Others need to be closed with stitches (sutures), staples, skin adhesive strips, or skin glue. Proper care of a laceration reduces the risk for infection, helps the laceration heal better, and may prevent scarring. General tips Keep the wound clean and dry. Do not scratch or pick at the wound. Wash your hands with soap and water for at least 20 seconds before and after touching your wound or changing your bandage (dressing). If soap and water are not available, use hand sanitizer. Do not usedisinfectants or antiseptics, such as rubbing alcohol, to clean your wound unless told by your health care provider. If you were given a dressing, you should change it at least once a day, or as told by your health care provider. You should also change it if it becomes wet or dirty. How to care for your laceration If sutures or staples were used: Keep the wound completely dry for the first 24 hours, or as told by your health care provider. After that time, you may shower or bathe. Do not soak your wound in water until after the sutures or staples have been removed. Clean the wound once each day, or as told by your health care provider. To do this: Wash the wound with soap and water. Rinse the wound with water to remove all soap. Pat the wound dry with a clean towel. Do not rub the wound. After cleaning the wound, apply a thin layer of antibiotic ointment, other topical ointments, or a non-adherent dressing as told by your health care provider. This will help prevent infection and keep the dressing from sticking to the wound. Have the sutures or staples removed as told by your health care provider. Do not  remove sutures or staples yourself. If skin adhesive strips were used: Do not get the skin adhesive strips wet. You may shower or bathe,  but keep the wound dry. If the wound gets wet, pat it dry with a clean towel. Do not rub the wound. Skin adhesive strips fall off on their own. If adhesive strip edges start to loosen and curl up, you may trim the loose edges. Do not remove adhesive strips completely unless your health care provider tells you to do that. If skin glue was used: You may shower or bathe, but try to keep the wound dry. Do not soak the wound in water. After showering or bathing, pat the wound dry with a clean towel. Do not rub the wound. Do not do any activities that will make you sweat a lot until the skin glue has fallen off. Do not apply liquid, cream, or ointment medicine to the wound while the skin glue is in place. Doing this may loosen the film before the wound has healed. If a dressing is placed over the wound, do not apply tape directly over the skin glue. Doing this may cause the glue to be pulled off before the wound has healed. Do not pick at the glue. Skin glue usually remains in place for 5-10 days and then falls off the skin. Follow these instructions at home: Medicines Take over-the-counter and prescription medicines only as told by your health care provider. If you were prescribed an antibiotic medicine or ointment, take or apply it as told by your health care provider. Do not stop using it even if   your condition improves. ?Managing pain and swelling ?If directed, put ice on the injured area. To do this: ?Put ice in a plastic bag. ?Place a towel between your skin and the bag. ?Leave the ice on for 20 minutes, 2-3 times a day. ?Remove the ice if your skin turns bright red. This is very important. If you cannot feel pain, heat, or cold, you have a greater risk of damage to the area. ?Raise (elevate) the injured area above the level of your heart while you are sitting or lying down for the first 24-48 hours after the laceration is repaired. ?General instructions ? ?Avoid any activity that could cause your wound  to reopen. ?Check your wound every day for signs of infection. Watch for: ?More redness, swelling, or pain. ?Fluid or blood. ?Warmth. ?Pus or a bad smell. ?Keep all follow-up visits. This is important. ?Contact a health care provider if: ?You received a tetanus shot and you have swelling, severe pain, redness, or bleeding at the injection site. ?Your closed wound breaks open. ?You have any of these signs of infection: ?More redness, swelling, or pain around your wound. ?Fluid or blood coming from your wound. ?Warmth coming from your wound. ?Pus or a bad smell coming from your wound. ?A fever. ?You notice something coming out of the wound, such as wood or glass. ?Your pain is not controlled with medicine. ?You notice a change in the color of your skin near your wound. ?You need to change the dressing often. ?You develop a new rash. ?You have numbness around the wound. ?Get help right away if: ?You develop severe swelling around the wound. ?Your pain suddenly increases and is severe. ?You develop painful lumps near the wound or on skin anywhere else on your body. ?You have a red streak going away from your wound. ?The wound is on your hand or foot, and you cannot properly move a finger or toe. ?The wound is on your hand or foot, and you notice that your fingers or toes look pale or bluish. ?Summary ?A laceration is a cut that may go through all layers of the skin and into the tissue that is right under the skin. ?Some lacerations heal on their own. Others need to be closed with stitches (sutures), staples, skin adhesive strips, or skin glue. ?Proper care of a laceration reduces the risk of infection, helps the laceration heal better, and may prevent scarring. ?This information is not intended to replace advice given to you by your health care provider. Make sure you discuss any questions you have with your health care provider. ?Document Revised: 03/08/2020 Document Reviewed: 03/08/2020 ?Elsevier Patient Education ?  [employer]. ? ?

## 2021-05-30 DIAGNOSIS — H34231 Retinal artery branch occlusion, right eye: Secondary | ICD-10-CM | POA: Diagnosis not present

## 2021-05-30 DIAGNOSIS — Z01818 Encounter for other preprocedural examination: Secondary | ICD-10-CM | POA: Diagnosis not present

## 2021-06-03 DIAGNOSIS — H2511 Age-related nuclear cataract, right eye: Secondary | ICD-10-CM | POA: Diagnosis not present

## 2021-06-11 DIAGNOSIS — H04123 Dry eye syndrome of bilateral lacrimal glands: Secondary | ICD-10-CM | POA: Diagnosis not present

## 2021-06-24 ENCOUNTER — Ambulatory Visit: Payer: Medicare PPO

## 2021-06-27 ENCOUNTER — Other Ambulatory Visit: Payer: Medicare PPO

## 2021-06-27 ENCOUNTER — Other Ambulatory Visit: Payer: Self-pay

## 2021-06-27 DIAGNOSIS — R972 Elevated prostate specific antigen [PSA]: Secondary | ICD-10-CM

## 2021-06-27 DIAGNOSIS — C61 Malignant neoplasm of prostate: Secondary | ICD-10-CM

## 2021-06-28 LAB — PSA: Prostate Specific Ag, Serum: 0.9 ng/mL (ref 0.0–4.0)

## 2021-07-04 ENCOUNTER — Encounter: Payer: Self-pay | Admitting: Urology

## 2021-07-04 ENCOUNTER — Ambulatory Visit: Payer: Medicare PPO | Admitting: Urology

## 2021-07-04 VITALS — BP 134/68 | HR 64

## 2021-07-04 DIAGNOSIS — C61 Malignant neoplasm of prostate: Secondary | ICD-10-CM

## 2021-07-04 DIAGNOSIS — Z8546 Personal history of malignant neoplasm of prostate: Secondary | ICD-10-CM | POA: Diagnosis not present

## 2021-07-04 DIAGNOSIS — N403 Nodular prostate with lower urinary tract symptoms: Secondary | ICD-10-CM | POA: Diagnosis not present

## 2021-07-04 DIAGNOSIS — R351 Nocturia: Secondary | ICD-10-CM

## 2021-07-04 LAB — URINALYSIS, ROUTINE W REFLEX MICROSCOPIC
Bilirubin, UA: NEGATIVE
Glucose, UA: NEGATIVE
Ketones, UA: NEGATIVE
Leukocytes,UA: NEGATIVE
Nitrite, UA: NEGATIVE
Protein,UA: NEGATIVE
RBC, UA: NEGATIVE
Specific Gravity, UA: 1.02 (ref 1.005–1.030)
Urobilinogen, Ur: 1 mg/dL (ref 0.2–1.0)
pH, UA: 6 (ref 5.0–7.5)

## 2021-07-04 NOTE — Progress Notes (Signed)
Subjective:  1. Cancer of prostate (HCC)   2. Nodular prostate with lower urinary tract symptoms   3. Nocturia     07/04/21: Jacob Rios returns today in f/u.   His PSA is down to 0.9 from 1.2 in 12/22.  It was 13.3 prior to the seed implant on 03/09/20.   He continues to have nocturia 3-4x.  He has had no hematuria or dysuria.  He has rare constipation but no bloody stools.  He has no weight loss or bone pain.  He has arthralgias.  His UA is clear today. His IPSS is 13.  01/03/21: Jacob Rios returns in f/u for the history below.  His PSA is down further to 1.2.  He is voiding well but has nocturia x 4 which is stable.  His IPSS is 6.  He had a stroke of the left eye since the last visit.  He was hospitalized with that for a week in August.   He had a Loop recorder placed in August for further monitoring and is now on Plavix.  He has had partial recovery of his vision.  UA is clear.     07/05/20: Jacob Rios returns today in f/u for his history of prostate cancer.  His PSA has fallen to 3.0 from 13.3 prior to treatment.   His IPSS is 10 with nocturia x 4.   His UA is ok today.    03/29/20: Jacob Rios returns today in f/u from his recent seed implant on 03/09/20.  He is voiding well with an IPSS of 8. He has nocturia x 4.  He has some hesitancy.  His UA has >30 RBC's.   He has felt a lump about an inch up in the rectum that is probably the SpaceOAR.  He has mild dysuria.     11/22/19: Jacob Rios returns today following a prostate biopsy for his history of low risk prostate cancer on surveillance.   The biopsy demonstrated 3 positive cores with Gleason 6 in the right and left apical lateral cores and Gleason 7(3+4) in the right apical medial core.   The prostate volume was 36ml.    His most recent PSA was 13.3 and has been rising.   He had a UTI and went to the ER in late July 2020 and was given antibiotics but had Mx species on the culture.    He has not had any further UTI's.  He passed a stone last year.  His IPSS is10.   His UA today is clear.   He had an MRI fusion biopsy in 3/19 for surveillance of his low risk prostate cancer. He had a single core of 20% of gleason 6 disease in the left lateral apex. he had atypia in one core. His PSA was 8.5 in 9/19 and 9.0 in 1/19. He has no associated signs or symptoms.   He was found to have a T2a Nx Mx Gleason 6 prostate cancer with 5% in one left apical and one right apical core in 07/26/13. A repeat biopsy in 8/16 had a single core of Gleason 6 in the left mid lateral gland with 10% involvement. His PSA prior to this visit is up to 8.4 from 6.6 in 5/18. It had been stable for the last 2 years. His Prostate volume was 24ml initally and 34ml on the repeat. He has a history of a 10mm right base prostate nodule and BOO. He has no associated signs or symptoms.  IPSS     Row Name 07/04/21 0900  International Prostate Symptom Score   How often have you had the sensation of not emptying your bladder? About half the time     How often have you had to urinate less than every two hours? About half the time     How often have you found you stopped and started again several times when you urinated? Less than 1 in 5 times     How often have you found it difficult to postpone urination? Not at All     How often have you had a weak urinary stream? Less than half the time     How often have you had to strain to start urination? Not at All     How many times did you typically get up at night to urinate? 4 Times     Total IPSS Score 13       Quality of Life due to urinary symptoms   If you were to spend the rest of your life with your urinary condition just the way it is now how would you feel about that? Pleased                 ROS:  ROS:  A complete review of systems was performed.  All systems are negative except for pertinent findings as noted.   ROS  Allergies  Allergen Reactions   Morphine Shortness Of Breath and Swelling    Outpatient Encounter  Medications as of 07/04/2021  Medication Sig   atorvastatin (LIPITOR) 40 MG tablet Take 1 tablet (40 mg total) by mouth daily.   Cholecalciferol (D3 ADULT PO) Take 1 tablet by mouth daily.   clopidogrel (PLAVIX) 75 MG tablet Take 1 tablet (75 mg total) by mouth daily.   glipiZIDE (GLUCOTROL XL) 10 MG 24 hr tablet Take 1 tablet (10 mg total) by mouth daily.   glucose blood (ONETOUCH VERIO) test strip Test 1X per day and as needed  Dx 250.02   Lancets (ONETOUCH ULTRASOFT) lancets Patient test 1X per day and prn  Dx 250.02   lisinopril (ZESTRIL) 2.5 MG tablet Take 1 tablet (2.5 mg total) by mouth daily.   metFORMIN (GLUCOPHAGE) 1000 MG tablet Take 1 tablet (1,000 mg total) by mouth 2 (two) times daily with a meal.   metoprolol succinate (TOPROL-XL) 50 MG 24 hr tablet TAKE 1 TABLET BY MOUTH ONCE DAILY WITH OR IMMEDIATELY FOLLOWING A MEAL   omeprazole (PRILOSEC) 40 MG capsule Take 1 capsule by mouth once daily   vitamin C (ASCORBIC ACID) 500 MG tablet Take 500 mg by mouth daily.   No facility-administered encounter medications on file as of 07/04/2021.    Past Medical History:  Diagnosis Date   Cataract    CKD (chronic kidney disease), stage III (HCC)    followed by pcp   Coronary artery disease cardiologist--- dr Clifton James   08/ 1999  s/p  cath w/ PTCA and stenting to RCA;   05/ 2000 inferior wall MI , 06-06-1998 s/p cabg x3;   Last heart cath 2005,  2 patent grafts (diagnol and RCA) and occluded LIMA--LAD graft with normal LAD nonobstructive disease;  last nuclear study 11/ 2014 no evidence ishcemia, ef 40%   Full dentures    Hiatal hernia    History of acute inferior wall MI 05/1998   s/p  cabg   Hypertension    Hypertensive retinopathy    Mixed hyperlipidemia    Nocturia more than twice per night    PAD (peripheral artery disease) (  HCC)    left common iliac artery stenosis per aorta ultrasound 03/ 2021 in epic and cardiology note   Prostate cancer [facility]) urologist--- dr Annabell Howells   first dx  07/ 2015 in active survillance until bx 11-11-2019,  Stage T2a, Gleason 3+4, PSA 13.3   S/P CABG x 3 06/06/1998   LIMA--LAD, SVG to Diagonal, RIMA to RCA   S/P primary angioplasty with coronary stent 08/1997   stent to RCA   Type 2 diabetes mellitus (HCC)    followed by pcp  (03-06-2020 checks blood sugar dialy in am,  fasting sugar-- 110-130)    Past Surgical History:  Procedure Laterality Date   APPENDECTOMY  child   CARDIAC CATHETERIZATION  09/01/2000  @[facility]    patent RCA and Diagonal grafts, atretic LIMA, moderate nonobstructive proxLAD, normal lvsf   CARDIAC CATHETERIZATION  04-21-2003  @[facility]    LIMA--LAD graft occluded, native LAD with nonobstructive disease,  patent diagonal/ rca grafts   CATARACT EXTRACTION     CATARACT EXTRACTION W/ INTRAOCULAR LENS IMPLANT Right 12/2018   CORONARY ANGIOPLASTY WITH STENT PLACEMENT  08/1997  @[facility]    ptca w/ stenting to rca   CORONARY ARTERY BYPASS GRAFT  06-06-1998  @[facility]     LIMA -- LAD, SVG -- Diagonal,  RIMA to RCA   CYSTOSCOPY N/A 03/09/2020   Procedure: CYSTOSCOPY FLEXIBLE;  Surgeon: Bjorn Pippin, MD;  Location[facility];  Service: Urology;  Laterality: N/A;  NO SEEDS FOUND IN BLADDER   EYE SURGERY     INGUINAL HERNIA REPAIR Right 1974   LOOP RECORDER INSERTION N/A 08/30/2020   Procedure: LOOP RECORDER INSERTION;  Surgeon: Lanier Prude, MD;  Location: [facility];  Service: Cardiovascular;  Laterality: N/A;   PROSTATE BIOPSY  06/2017   x 4-5    RADIOACTIVE SEED IMPLANT N/A 03/09/2020   Procedure: RADIOACTIVE SEED IMPLANT/BRACHYTHERAPY IMPLANT;  Surgeon: Bjorn Pippin, MD;  Location: [facility];  Service: Urology;  Laterality: N/A;   70  SEEDS IMPLANTED   SPACE OAR INSTILLATION N/A 03/09/2020   Procedure: SPACE OAR INSTILLATION;  Surgeon: Bjorn Pippin, MD;  Location: [facility];  Service: Urology;  Laterality: N/A;   VENTRAL HERNIA REPAIR  09/ 2000 and recurrent repair 06/ 2001    Social  History   Socioeconomic History   Marital status: Married    Spouse name: Bonita Quin    Number of children: 3   Years of education: Not on file   Highest education level: Not on file  Occupational History   Occupation: part-time trucker    Comment: retired   Tobacco Use   Smoking status: Former    Packs/day: 4.00    Years: 45.00    Total pack years: 180.00    Types: Cigarettes    Quit date: 01/13/1997    Years since quitting: 24.4   Smokeless tobacco: Never   Tobacco comments:    reports he began smoking at age 60  Vaping Use   Vaping Use: Never used  Substance and Sexual Activity   Alcohol use: No   Drug use: Never   Sexual activity: Not on file  Other Topics Concern   Not on file  Social History Narrative   Lives home with wife   Social Determinants of Health   Financial Resource Strain: Low Risk  (06/21/2020)   Overall Financial Resource Strain (CARDIA)    Difficulty of Paying Living Expenses: Not very hard  Food Insecurity: No Food Insecurity (06/21/2020)   Hunger  Vital Sign    Worried About Programme researcher, broadcasting/film/video in the Last Year: Never true    Ran Out of Food in the Last Year: Never true  Transportation Needs: No Transportation Needs (06/21/2020)   PRAPARE - Administrator, Civil Service (Medical): No    Lack of Transportation (Non-Medical): No  Physical Activity: Sufficiently Active (06/21/2020)   Exercise Vital Sign    Days of Exercise per Week: 7 days    Minutes of Exercise per Session: 30 min  Stress: No Stress Concern Present (06/21/2020)   Jacob Rios [employer] Occupational [employer] - Occupational Stress Questionnaire    Feeling of Stress : Only a little  Social Connections: Socially Integrated (06/21/2020)   Social Connection and Isolation Panel [NHANES]    Frequency of Communication with Friends and Family: More than three times a week    Frequency of Social Gatherings with Friends and Family: More than three times a week    Attends Religious Services: More  than 4 times per year    Active Member of [employer] or Organizations: Yes    Attends Engineer, structural: More than 4 times per year    Marital Status: Married  Catering manager Violence: Not At Risk (06/21/2020)   Humiliation, Afraid, Rape, and Kick questionnaire    Fear of Current or Ex-Partner: No    Emotionally Abused: No    Physically Abused: No    Sexually Abused: No    Family History  Problem Relation Age of Onset   Lung cancer Mother    Dementia Father    Hyperlipidemia Father    Congestive Heart Failure Father    Post-traumatic stress disorder Son    Alcohol abuse Maternal Grandfather    Cancer Maternal Grandfather 90       stomach cancer    Heart attack Neg Hx    Stroke Neg Hx    Breast cancer Neg Hx    Colon cancer Neg Hx    Prostate cancer Neg Hx    Pancreatic cancer Neg Hx        Objective: Vitals:   07/04/21 0910  BP: 134/68  Pulse: 64     Physical Exam  Lab Results:  Lab Results  Component Value Date   PSA1 0.9 06/27/2021   PSA1 1.2 12/27/2020   PSA1 3.0 06/28/2020       BMET No results for input(s): "NA", "K", "CL", "CO2", "GLUCOSE", "BUN", "CREATININE", "CALCIUM" in the last 72 hours. PSA PSA  Date Value Ref Range Status  03/30/2019 10.4 (H) < OR = 4.0 ng/mL Final    Comment:    The total PSA value from this assay system is  standardized against the WHO standard. The test  result will be approximately 20% lower when compared  to the equimolar-standardized total PSA (Beckman  Coulter). Comparison of serial PSA results should be  interpreted with this fact in mind. . This test was performed using the Siemens  chemiluminescent method. Values obtained from  different assay methods cannot be used interchangeably. PSA levels, regardless of value, should not be interpreted as absolute evidence of the presence or absence of disease.   11/18/2013 5.0 (H) 0.0 - 4.0 ng/mL Final    Comment:    Roche ECLIA methodology. According to  the American Urological Association, Serum PSA should decrease and remain at undetectable levels after radical prostatectomy. The AUA defines biochemical recurrence as an initial PSA value 0.2 ng/mL or greater followed by a subsequent confirmatory  PSA value 0.2 ng/mL or greater. Values obtained with different assay methods or kits cannot be used interchangeably. Results cannot be interpreted as absolute evidence of the presence or absence of malignant disease.   05/20/2013 5.3 (H) 0.0 - 4.0 ng/mL Final    Comment:    Roche ECLIA methodology. According to the American Urological Association, Serum PSA should decrease and remain at undetectable levels after radical prostatectomy. The AUA defines biochemical recurrence as an initial PSA value 0.2 ng/mL or greater followed by a subsequent confirmatory PSA value 0.2 ng/mL or greater. Values obtained with different assay methods or kits cannot be used interchangeably. Results cannot be interpreted as absolute evidence of the presence or absence of malignant disease.   No results found for: "TESTOSTERONE"  Recent Results (from the past 2160 hour(s))  PSA     Status: None   Collection Time: 06/27/21  9:00 AM  Result Value Ref Range   Prostate Specific Ag, Serum 0.9 0.0 - 4.0 ng/mL    Comment: Roche ECLIA methodology. According to the American Urological Association, Serum PSA should decrease and remain at undetectable levels after radical prostatectomy. The AUA defines biochemical recurrence as an initial PSA value 0.2 ng/mL or greater followed by a subsequent confirmatory PSA value 0.2 ng/mL or greater. Values obtained with different assay methods or kits cannot be used interchangeably. Results cannot be interpreted as absolute evidence of the presence or absence of malignant disease.   Urinalysis, Routine w reflex microscopic     Status: None   Collection Time: 07/04/21  9:46 AM  Result Value Ref Range   Specific Gravity, UA 1.020  1.005 - 1.030   pH, UA 6.0 5.0 - 7.5   Color, UA Yellow Yellow   Appearance Ur Clear Clear   Leukocytes,UA Negative Negative   Protein,UA Negative Negative/Trace   Glucose, UA Negative Negative   Ketones, UA Negative Negative   RBC, UA Negative Negative   Bilirubin, UA Negative Negative   Urobilinogen, Ur 1.0 0.2 - 1.0 mg/dL   Nitrite, UA Negative Negative   Microscopic Examination Comment     Comment: Microscopic follows if indicated.     Studies/Results:     Assessment & Plan: T2a Nx Mx Gleason 7(3+4) disease s/p seed implant.   He is doing well post op with a nice decline in the PSA.  He with return in 6 months with a PSA.   Microhematuria.  Resolved.  Nodular prostate with LUTS.   He has stable nocturia.   No orders of the defined types were placed in this encounter.     Orders Placed This Encounter  Procedures   Urinalysis, Routine w reflex microscopic   PSA    Standing Status:   Future    Standing Expiration Date:   07/05/2022      Return in about 6 months (around 01/03/2022) for with PSA.   CC: Bennie Pierini, FNP      Bjorn Pippin 07/05/2021 Patient ID: Jacob Rios, male   DOB: 08/28/1945, 76 y.o.   MRN: [id number]

## 2021-07-11 ENCOUNTER — Telehealth: Payer: Self-pay

## 2021-07-11 NOTE — Telephone Encounter (Signed)
Patient called to let us know [employer] is sending a new phone since his is no longer working. They should receive it 1-2 weeks and they will call us if they need any help

## 2021-08-02 ENCOUNTER — Ambulatory Visit: Payer: Medicare PPO | Admitting: Nurse Practitioner

## 2021-08-02 ENCOUNTER — Encounter: Payer: Self-pay | Admitting: Nurse Practitioner

## 2021-08-02 VITALS — BP 134/77 | HR 63 | Temp 97.8°F | Resp 20 | Ht 71.0 in | Wt 225.0 lb

## 2021-08-02 DIAGNOSIS — N183 Chronic kidney disease, stage 3 unspecified: Secondary | ICD-10-CM

## 2021-08-02 DIAGNOSIS — Z6835 Body mass index (BMI) 35.0-35.9, adult: Secondary | ICD-10-CM

## 2021-08-02 DIAGNOSIS — E119 Type 2 diabetes mellitus without complications: Secondary | ICD-10-CM

## 2021-08-02 DIAGNOSIS — E782 Mixed hyperlipidemia: Secondary | ICD-10-CM

## 2021-08-02 DIAGNOSIS — K219 Gastro-esophageal reflux disease without esophagitis: Secondary | ICD-10-CM | POA: Diagnosis not present

## 2021-08-02 DIAGNOSIS — C61 Malignant neoplasm of prostate: Secondary | ICD-10-CM | POA: Diagnosis not present

## 2021-08-02 DIAGNOSIS — I25810 Atherosclerosis of coronary artery bypass graft(s) without angina pectoris: Secondary | ICD-10-CM | POA: Diagnosis not present

## 2021-08-02 DIAGNOSIS — I10 Essential (primary) hypertension: Secondary | ICD-10-CM | POA: Diagnosis not present

## 2021-08-02 DIAGNOSIS — I6930 Unspecified sequelae of cerebral infarction: Secondary | ICD-10-CM

## 2021-08-02 DIAGNOSIS — E1122 Type 2 diabetes mellitus with diabetic chronic kidney disease: Secondary | ICD-10-CM | POA: Diagnosis not present

## 2021-08-02 LAB — BAYER DCA HB A1C WAIVED: HB A1C (BAYER DCA - WAIVED): 5.8 % — ABNORMAL HIGH (ref 4.8–5.6)

## 2021-08-02 MED ORDER — ATORVASTATIN CALCIUM 40 MG PO TABS: 40.0000 mg | ORAL_TABLET | Freq: Every day | ORAL | 1 refills | Status: DC

## 2021-08-02 MED ORDER — METFORMIN HCL 1000 MG PO TABS: 1000.0000 mg | ORAL_TABLET | Freq: Two times a day (BID) | ORAL | 1 refills | Status: DC

## 2021-08-02 MED ORDER — METOPROLOL SUCCINATE ER 50 MG PO TB24: ORAL_TABLET | ORAL | 1 refills | Status: DC

## 2021-08-02 MED ORDER — GLIPIZIDE ER 10 MG PO TB24: 10.0000 mg | ORAL_TABLET | Freq: Every day | ORAL | 1 refills | Status: DC

## 2021-08-02 MED ORDER — CLOPIDOGREL BISULFATE 75 MG PO TABS: 75.0000 mg | ORAL_TABLET | Freq: Every day | ORAL | 1 refills | Status: DC

## 2021-08-02 MED ORDER — OMEPRAZOLE 40 MG PO CPDR: 40.0000 mg | DELAYED_RELEASE_CAPSULE | Freq: Every day | ORAL | 1 refills | Status: DC

## 2021-08-02 MED ORDER — LISINOPRIL 2.5 MG PO TABS: 2.5000 mg | ORAL_TABLET | Freq: Every day | ORAL | 1 refills | Status: DC

## 2021-08-02 NOTE — Patient Instructions (Signed)

## 2021-08-02 NOTE — Progress Notes (Signed)
Subjective:    Patient ID: Jacob Rios, male    DOB: [birth year], 76 y.o.   MRN: [id number]   Chief Complaint: medical management of chronic issues     HPI:  Jacob Rios is a 76 y.o. who identifies as a male who was assigned male at birth.   Social history: Lives with: wife Work history: retired   Scientist, forensic in today for follow up of the following chronic medical issues:  1. Essential hypertension No c/o chest pain, sob or headache. Does not check blood pressure at home. BP Readings from Last 3 Encounters:  07/04/21 134/68  04/24/21 116/70  03/15/21 136/70    2. 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 He was scheduled for vas [place] of abdominal aorta on 04/23/21. Aorta measurements were unchanged from previous study. Will repeat U/S in 2024. No changes were made to plan of care.  3. Gastroesophageal reflux disease without esophagitis Is on omeprazole daily and is doing well.  4. CKD stage 3 due to type 2 diabetes mellitus ([place]) No voiding  issues. Lab Results  Component Value Date   CREATININE 1.51 (H) 02/01/2021     5. Type 2 diabetes mellitus without complication, without long-term current use of insulin (HCC) Fasting blood sugars usually around -110-130. No low blood sugars. Lab Results  Component Value Date   HGBA1C 5.8 (H) 02/01/2021     6. Mixed hyperlipidemia Does not watch diet very closely. Does no dedicated exercise. Lab Results  Component Value Date   CHOL 116 02/01/2021   HDL 31 (L) 02/01/2021   LDLCALC 68 02/01/2021   TRIG 83 02/01/2021   CHOLHDL 3.7 02/01/2021     7. Late effect of cerebrovascular accident (CVA) No permanent effects.   8. Malignant neoplasm of prostate ([place]) Has radiation seeds- sees urology every 6 months  9. BMI 35.0-35.9,adult No recent weight changes Wt Readings from Last 3 Encounters:  08/02/21 225 lb (102.1 kg)  04/24/21 225 lb (102.1 kg)  03/15/21  227 lb (103 kg)   BMI Readings from Last 3 Encounters:  08/02/21 31.38 kg/m  04/24/21 31.38 kg/m  03/15/21 31.66 kg/m     New complaints: None today  Allergies  Allergen Reactions   Morphine Shortness Of Breath and Swelling   Outpatient Encounter Medications as of 08/02/2021  Medication Sig   atorvastatin (LIPITOR) 40 MG tablet Take 1 tablet (40 mg total) by mouth daily.   Cholecalciferol (D3 ADULT PO) Take 1 tablet by mouth daily.   clopidogrel (PLAVIX) 75 MG tablet Take 1 tablet (75 mg total) by mouth daily.   glipiZIDE (GLUCOTROL XL) 10 MG 24 hr tablet Take 1 tablet (10 mg total) by mouth daily.   glucose blood (ONETOUCH VERIO) test strip Test 1X per day and as needed  Dx 250.02   Lancets (ONETOUCH ULTRASOFT) lancets Patient test 1X per day and prn  Dx 250.02   lisinopril (ZESTRIL) 2.5 MG tablet Take 1 tablet (2.5 mg total) by mouth daily.   metFORMIN (GLUCOPHAGE) 1000 MG tablet Take 1 tablet (1,000 mg total) by mouth 2 (two) times daily with a meal.   metoprolol succinate (TOPROL-XL) 50 MG 24 hr tablet TAKE 1 TABLET BY MOUTH ONCE DAILY WITH OR IMMEDIATELY FOLLOWING A MEAL   omeprazole (PRILOSEC) 40 MG capsule Take 1 capsule by mouth once daily   vitamin C (ASCORBIC ACID) 500 MG tablet Take 500 mg by mouth daily.   No facility-administered encounter  medications on file as of 08/02/2021.    Past Surgical History:  Procedure Laterality Date   APPENDECTOMY  child   CARDIAC CATHETERIZATION  09/01/2000  '@[facility]'    patent RCA and Diagonal grafts, atretic LIMA, moderate nonobstructive proxLAD, normal lvsf   CARDIAC CATHETERIZATION  04-21-2003  '@[facility]'    LIMA--LAD graft occluded, native LAD with nonobstructive disease,  patent diagonal/ rca grafts   CATARACT EXTRACTION     CATARACT EXTRACTION W/ INTRAOCULAR LENS IMPLANT Right 12/2018   CORONARY ANGIOPLASTY WITH STENT PLACEMENT  08/1997  '@[facility]'    ptca w/ stenting to rca   CORONARY ARTERY BYPASS GRAFT  06-06-1998  '@[facility]'     LIMA -- LAD, SVG  -- Diagonal,  RIMA to RCA   CYSTOSCOPY N/A 03/09/2020   Procedure: CYSTOSCOPY FLEXIBLE;  Surgeon: [name], MD;  Location: [facility];  Service: Urology;  Laterality: N/A;  NO SEEDS FOUND IN BLADDER   EYE SURGERY     INGUINAL HERNIA REPAIR Right 1974   LOOP RECORDER INSERTION N/A 08/30/2020   Procedure: LOOP RECORDER INSERTION;  Surgeon: Vickie Epley, MD;  Location: [facility];  Service: Cardiovascular;  Laterality: N/A;   PROSTATE BIOPSY  06/2017   x 4-5    RADIOACTIVE SEED IMPLANT N/A 03/09/2020   Procedure: RADIOACTIVE SEED IMPLANT/BRACHYTHERAPY IMPLANT;  Surgeon: [name], MD;  [facility];  Service: Urology;  Laterality: N/A;   70  SEEDS IMPLANTED   SPACE OAR INSTILLATION N/A 03/09/2020   Procedure: SPACE OAR INSTILLATION;  Surgeon: [name], MD;  Location: [facility];  Service: Urology;  Laterality: N/A;   VENTRAL HERNIA REPAIR  09/ 2000 and recurrent repair 06/ 2001    Family History  Problem Relation Age of Onset   Lung cancer Mother    Dementia Father    Hyperlipidemia Father    Congestive Heart Failure Father    Post-traumatic stress disorder Son    Alcohol abuse Maternal Grandfather    Cancer Maternal Grandfather 90       stomach cancer    Heart attack Neg Hx    Stroke Neg Hx    Breast cancer Neg Hx    Colon cancer Neg Hx    Prostate cancer Neg Hx    Pancreatic cancer Neg Hx       Controlled substance contract: n/a     Review of Systems  Constitutional:  Negative for diaphoresis.  Eyes:  Negative for pain.  Respiratory:  Negative for shortness of breath.   Cardiovascular:  Negative for chest pain, palpitations and leg swelling.  Gastrointestinal:  Negative for abdominal pain.  Endocrine: Negative for polydipsia.  Skin:  Negative for rash.  Neurological:  Negative for dizziness, weakness and headaches.  Hematological:  Does not bruise/bleed easily.  All other systems reviewed  and are negative.      Objective:   Physical Exam Vitals and nursing note reviewed.  Constitutional:      Appearance: Normal appearance. He is well-developed.  HENT:     Head: Normocephalic.     Nose: Nose normal.     Mouth/Throat:     Mouth: Mucous membranes are moist.     Pharynx: Oropharynx is clear.  Eyes:     Pupils: Pupils are equal, round, and reactive to light.  Neck:     Thyroid: No thyroid mass or thyromegaly.     Vascular: No carotid bruit or JVD.     Trachea: Phonation normal.  Cardiovascular:  Rate and Rhythm: Normal rate and regular rhythm.  Pulmonary:     Effort: Pulmonary effort is normal. No respiratory distress.     Breath sounds: Normal breath sounds.  Abdominal:     General: Bowel sounds are normal.     Palpations: Abdomen is soft.     Tenderness: There is no abdominal tenderness.  Musculoskeletal:        General: Normal range of motion.     Cervical back: Normal range of motion and neck supple.  Lymphadenopathy:     Cervical: No cervical adenopathy.  Skin:    General: Skin is warm and dry.  Neurological:     Mental Status: He is alert and oriented to person, place, and time.  Psychiatric:        Behavior: Behavior normal.        Thought Content: Thought content normal.        Judgment: Judgment normal.    BP 134/77   Pulse 63   Temp 97.8 F (36.6 C) (Temporal)   Resp 20   Ht '5\' 11"'  (1.803 m)   Wt 225 lb (102.1 kg)   SpO2 97%   BMI 31.38 kg/m   HGBA1c 5.8%       Assessment & Plan:   Jacob Rios comes in today with chief complaint of Medical Management of Chronic Issues   Diagnosis and orders addressed:  1. Essential hypertension Low sodium diet - CBC with Differential/Platelet - CMP14+EGFR - metoprolol succinate (TOPROL-XL) 50 MG 24 hr tablet; TAKE 1 TABLET BY MOUTH ONCE DAILY WITH OR IMMEDIATELY FOLLOWING A MEAL  Dispense: 90 tablet; Refill: 1 - lisinopril (ZESTRIL) 2.5 MG tablet; Take 1 tablet (2.5 mg total) by mouth  daily.  Dispense: 90 tablet; Refill: 1  2. 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  Dispense: 90 tablet; Refill: 1  3. Gastroesophageal reflux disease without esophagitis Avoid spicy foods Do not eat 2 hours prior to bedtime - omeprazole (PRILOSEC) 40 MG capsule; Take 1 capsule (40 mg total) by mouth daily.  Dispense: 90 capsule; Refill: 1  4. CKD stage 3 due to type 2 diabetes mellitus ([place]) Labs pending  5. Type 2 diabetes mellitus without complication, without long-term current use of insulin (HCC) Continue to watch carbs in diet - Bayer DCA Hb A1c Waived - glipiZIDE (GLUCOTROL XL) 10 MG 24 hr tablet; Take 1 tablet (10 mg total) by mouth daily.  Dispense: 90 tablet; Refill: 1 - metFORMIN (GLUCOPHAGE) 1000 MG tablet; Take 1 tablet (1,000 mg total) by mouth 2 (two) times daily with a meal.  Dispense: 180 tablet; Refill: 1  6. Mixed hyperlipidemia Low fat diet - Lipid panel - atorvastatin (LIPITOR) 40 MG tablet; Take 1 tablet (40 mg total) by mouth daily.  Dispense: 90 tablet; Refill: 1  7. Late effect of cerebrovascular accident (CVA)  8. Malignant neoplasm of prostate [facility]) Keep follow up with urology - PSA, total and free  9. BMI 35.0-35.9,adult Discussed diet and exercise for person with BMI >25 Will recheck weight in 3-6 months    Labs pending Health Maintenance reviewed Diet and exercise encouraged  Follow up plan: 6 months   [name], FNP

## 2021-08-03 LAB — CMP14+EGFR
ALT: 11 IU/L (ref 0–44)
AST: 13 IU/L (ref 0–40)
Albumin/Globulin Ratio: 1.8 (ref 1.2–2.2)
Albumin: 4.4 g/dL (ref 3.8–4.8)
Alkaline Phosphatase: 83 IU/L (ref 44–121)
BUN/Creatinine Ratio: 13 (ref 10–24)
BUN: 21 mg/dL (ref 8–27)
Bilirubin Total: 0.3 mg/dL (ref 0.0–1.2)
CO2: 23 mmol/L (ref 20–29)
Calcium: 9.4 mg/dL (ref 8.6–10.2)
Chloride: 104 mmol/L (ref 96–106)
Creatinine, Ser: 1.63 mg/dL — ABNORMAL HIGH (ref 0.76–1.27)
Globulin, Total: 2.5 g/dL (ref 1.5–4.5)
Glucose: 105 mg/dL — ABNORMAL HIGH (ref 70–99)
Potassium: 4.9 mmol/L (ref 3.5–5.2)
Sodium: 141 mmol/L (ref 134–144)
Total Protein: 6.9 g/dL (ref 6.0–8.5)
eGFR: 44 mL/min/{1.73_m2} — ABNORMAL LOW (ref >59)

## 2021-08-03 LAB — CBC WITH DIFFERENTIAL/PLATELET
Basophils Absolute: 0 10*3/uL (ref 0.0–0.2)
Basos: 1 %
EOS (ABSOLUTE): 0.4 10*3/uL (ref 0.0–0.4)
Eos: 6 %
Hematocrit: 37.8 % (ref 37.5–51.0)
Hemoglobin: 12.3 g/dL — ABNORMAL LOW (ref 13.0–17.7)
Immature Grans (Abs): 0 10*3/uL (ref 0.0–0.1)
Immature Granulocytes: 0 %
Lymphocytes Absolute: 0.9 10*3/uL (ref 0.7–3.1)
Lymphs: 15 %
MCH: 27.8 pg (ref 26.6–33.0)
MCHC: 32.5 g/dL (ref 31.5–35.7)
MCV: 86 fL (ref 79–97)
Monocytes Absolute: 0.5 10*3/uL (ref 0.1–0.9)
Monocytes: 9 %
Neutrophils Absolute: 3.9 10*3/uL (ref 1.4–7.0)
Neutrophils: 69 %
Platelets: 176 10*3/uL (ref 150–450)
RBC: 4.42 x10E6/uL (ref 4.14–5.80)
RDW: 14.5 % (ref 11.6–15.4)
WBC: 5.7 10*3/uL (ref 3.4–10.8)

## 2021-08-03 LAB — PSA, TOTAL AND FREE
PSA, Free Pct: 21.1 %
PSA, Free: 0.19 ng/mL
Prostate Specific Ag, Serum: 0.9 ng/mL (ref 0.0–4.0)

## 2021-08-03 LAB — LIPID PANEL
Chol/HDL Ratio: 3.9 ratio (ref 0.0–5.0)
Cholesterol, Total: 116 mg/dL (ref 100–199)
HDL: 30 mg/dL — ABNORMAL LOW (ref >39)
LDL Chol Calc (NIH): 71 mg/dL (ref 0–99)
Triglycerides: 71 mg/dL (ref 0–149)
VLDL Cholesterol Cal: 15 mg/dL (ref 5–40)

## 2021-08-14 DIAGNOSIS — H10023 Other mucopurulent conjunctivitis, bilateral: Secondary | ICD-10-CM | POA: Diagnosis not present

## 2021-08-19 ENCOUNTER — Ambulatory Visit (INDEPENDENT_AMBULATORY_CARE_PROVIDER_SITE_OTHER): Payer: Medicare PPO

## 2021-08-19 DIAGNOSIS — I639 Cerebral infarction, unspecified: Secondary | ICD-10-CM

## 2021-08-19 LAB — CUP PACEART REMOTE DEVICE CHECK
Date Time Interrogation Session: 20230804231013
Implantable Pulse Generator Implant Date: 20220818

## 2021-09-18 NOTE — Progress Notes (Signed)
[facility] Summary Report / Loop Recorder 

## 2021-09-23 ENCOUNTER — Ambulatory Visit (INDEPENDENT_AMBULATORY_CARE_PROVIDER_SITE_OTHER): Payer: Medicare PPO

## 2021-09-23 DIAGNOSIS — I639 Cerebral infarction, unspecified: Secondary | ICD-10-CM

## 2021-09-24 LAB — CUP PACEART REMOTE DEVICE CHECK
Date Time Interrogation Session: 20230906230948
Implantable Pulse Generator Implant Date: 20220818

## 2021-10-03 ENCOUNTER — Encounter: Payer: Self-pay | Admitting: Nurse Practitioner

## 2021-10-03 ENCOUNTER — Ambulatory Visit: Payer: Medicare PPO | Admitting: Nurse Practitioner

## 2021-10-03 VITALS — BP 147/78 | HR 62 | Temp 97.3°F | Resp 20 | Ht 71.0 in | Wt 235.0 lb

## 2021-10-03 DIAGNOSIS — L723 Sebaceous cyst: Secondary | ICD-10-CM

## 2021-10-03 NOTE — Progress Notes (Signed)
Subjective:    Patient ID: Jacob Rios, male    DOB: [birth year], 76 y.o.   MRN: [id number]   Chief Complaint: Knot on top of head   76 yo M seen today for cc of knot on top of his head. Pt reports he hit his head on a piece of galvanized metal when he was coming out from under a house about 5 weeks ago. Denies any loss of consciousness; says it "may have bled a little bit". Denies tenderness to the area, headache, dizziness.       Review of Systems  Constitutional: Negative.  Negative for fatigue.  HENT:         Knot on top of head on  L side  Eyes:  Negative for pain.  Respiratory:  Negative for shortness of breath.   Cardiovascular:  Negative for chest pain, palpitations and leg swelling.  Gastrointestinal:  Negative for abdominal pain.  Endocrine: Negative.   Skin:  Negative for rash.  Neurological:  Negative for dizziness, syncope, weakness, light-headedness and headaches.  Hematological:  Does not bruise/bleed easily.  All other systems reviewed and are negative.      Objective:   Physical Exam Vitals and nursing note reviewed.  Constitutional:      General: He is not in acute distress.    Appearance: Normal appearance.  HENT:     Head: Normocephalic.     Comments: Small, soft, nontender, palpable knot on top of head L side.    Nose: Nose normal.  Eyes:     Pupils: Pupils are equal, round, and reactive to light.  Cardiovascular:     Rate and Rhythm: Normal rate and regular rhythm.  Pulmonary:     Effort: Pulmonary effort is normal.     Breath sounds: Normal breath sounds.  Abdominal:     General: Bowel sounds are normal.     Palpations: Abdomen is soft.  Musculoskeletal:        General: Normal range of motion.     Cervical back: Normal range of motion and neck supple.  Skin:    General: Skin is warm and dry.     Findings: No abrasion, abscess, laceration or lesion.  Neurological:     Mental Status: He is alert and oriented to person, place, and  time.  Psychiatric:        Mood and Affect: Mood normal.        Behavior: Behavior normal.        Thought Content: Thought content normal.        Judgment: Judgment normal.   BP (!) 147/78   Pulse 62   Temp (!) 97.3 F (36.3 C) (Temporal)   Resp 20   Ht '5\' 11"'$  (1.803 m)   Wt 235 lb (106.6 kg)   SpO2 97%   BMI 32.78 kg/m          Assessment & Plan:   Jacob Rios in today with chief complaint of Knot on top of head   1. Sebaceous cyst Just watch for now.  Does not appear inflammed so antibiotic is not needed    The above assessment and management plan was discussed with the patient. The patient verbalized understanding of and has agreed to the management plan. Patient is aware to call the clinic if symptoms persist or worsen. Patient is aware when to return to the clinic for a follow-up visit. Patient educated on when it is appropriate to go to the emergency department.  [name], FNP    Collene Leyden, FNP student

## 2021-10-11 NOTE — Progress Notes (Signed)
[facility] Summary Report / Loop Recorder 

## 2021-10-25 LAB — CUP PACEART REMOTE DEVICE CHECK
Date Time Interrogation Session: 20231009230921
Implantable Pulse Generator Implant Date: 20220818

## 2021-10-28 ENCOUNTER — Ambulatory Visit (INDEPENDENT_AMBULATORY_CARE_PROVIDER_SITE_OTHER): Payer: Medicare PPO

## 2021-10-28 DIAGNOSIS — I639 Cerebral infarction, unspecified: Secondary | ICD-10-CM

## 2021-11-06 ENCOUNTER — Ambulatory Visit: Payer: Medicare PPO

## 2021-11-15 NOTE — Progress Notes (Signed)
[facility] Summary Report / Loop Recorder 

## 2021-11-18 NOTE — Progress Notes (Signed)
[facility] Note  11/20/2021     CHIEF COMPLAINT Patient presents for Retina Follow Up   HISTORY OF PRESENT ILLNESS: Jacob Rios is a 76 y.o. male who presents to the clinic today for:   HPI     Retina Follow Up   Patient presents with  CRVO/BRVO.  In left eye.  This started years ago.  Duration of 9 months.  Since onset it is gradually improving.  I, the attending physician,  performed the HPI with the patient and updated documentation appropriately.        Comments   Patient feels that he sees the shadow in the left eye at random times and it goes from dark to clear. The shadow moves around. He is using Refresh OU PRN- TID. His blood sugar was 120 and his A1C is 6.1.      Last edited by Jacob Caffey, MD on 11/21/2021 10:41 PM.    Pt states he has had cataract sx OD with Jacob Rios since he was here last, he states left eye vision is gradually getting better, pt is using Refresh as needed for dryness  Referring physician: Chevis Rios, [place],  [place] [id number]  HISTORICAL INFORMATION:   Selected notes from the MEDICAL RECORD NUMBER Referred by Dr. Marin Rios for BRAO OS LEE:  Ocular Hx- PMH-    CURRENT MEDICATIONS: No current outpatient medications on file. (Ophthalmic Drugs)   No current facility-administered medications for this visit. (Ophthalmic Drugs)   Current Outpatient Medications (Other)  Medication Sig   atorvastatin (LIPITOR) 40 MG tablet Take 1 tablet (40 mg total) by mouth daily.   Cholecalciferol (D3 ADULT PO) Take 1 tablet by mouth daily.   clopidogrel (PLAVIX) 75 MG tablet Take 1 tablet (75 mg total) by mouth daily.   glipiZIDE (GLUCOTROL XL) 10 MG 24 hr tablet Take 1 tablet (10 mg total) by mouth daily.   glucose blood (ONETOUCH VERIO) test strip Test 1X per day and as needed  Dx 250.02   Lancets (ONETOUCH ULTRASOFT) lancets Patient test 1X per day and prn  Dx 250.02   lisinopril (ZESTRIL)  2.5 MG tablet Take 1 tablet (2.5 mg total) by mouth daily.   metFORMIN (GLUCOPHAGE) 1000 MG tablet Take 1 tablet (1,000 mg total) by mouth 2 (two) times daily with a meal.   metoprolol succinate (TOPROL-XL) 50 MG 24 hr tablet TAKE 1 TABLET BY MOUTH ONCE DAILY WITH OR IMMEDIATELY FOLLOWING A MEAL   omeprazole (PRILOSEC) 40 MG capsule Take 1 capsule (40 mg total) by mouth daily.   vitamin C (ASCORBIC ACID) 500 MG tablet Take 500 mg by mouth daily.   No current facility-administered medications for this visit. (Other)   REVIEW OF SYSTEMS: ROS   Positive for: Gastrointestinal, Genitourinary, Endocrine, Eyes Negative for: Constitutional, Neurological, Skin, Musculoskeletal, HENT, Cardiovascular, Respiratory, Psychiatric, Allergic/Imm, Heme/Lymph Last edited by Jacob Rios, COT on 11/20/2021  8:40 AM.     ALLERGIES Allergies  Allergen Reactions   Morphine Shortness Of Breath and Swelling   PAST MEDICAL HISTORY Past Medical History:  Diagnosis Date   Cataract    CKD (chronic kidney disease), stage III ([place])    followed by pcp   Coronary artery disease cardiologist--- dr Jacob Rios   08/ 1999  s/p  cath w/ PTCA and stenting to RCA;   05/ 2000 inferior wall MI , 06-06-1998 s/p cabg x3;   Last heart cath 2005,  2 patent  grafts (diagnol and RCA) and occluded LIMA--LAD graft with normal LAD nonobstructive disease;  last nuclear study 11/ 2014 no evidence ishcemia, ef 40%   Full dentures    Hiatal hernia    History of acute inferior wall MI 05/1998   s/p  cabg   Hypertension    Hypertensive retinopathy    Mixed hyperlipidemia    Nocturia more than twice per night    PAD (peripheral artery disease) (HCC)    left common iliac artery stenosis per aorta ultrasound 03/ 2021 in epic and cardiology note   Prostate cancer [facility]) urologist--- dr Jacob Rios   first dx 07/ 2015 in active survillance until bx 11-11-2019,  Stage T2a, Gleason 3+4, PSA 13.3   S/P CABG x 3 06/06/1998   LIMA--LAD, SVG to  Diagonal, RIMA to RCA   S/P primary angioplasty with coronary stent 08/1997   stent to RCA   Type 2 diabetes mellitus ([place])    followed by pcp  (03-06-2020 checks blood sugar dialy in am,  fasting sugar-- 110-130)   Past Surgical History:  Procedure Laterality Date   APPENDECTOMY  child   CARDIAC CATHETERIZATION  09/01/2000  _0    patent RCA and Diagonal grafts, atretic LIMA, moderate nonobstructive proxLAD, normal lvsf   CARDIAC CATHETERIZATION  04-21-2003  _1    LIMA--LAD graft occluded, native LAD with nonobstructive disease,  patent diagonal/ rca grafts   CATARACT EXTRACTION     CATARACT EXTRACTION W/ INTRAOCULAR LENS IMPLANT Right 12/2018   CORONARY ANGIOPLASTY WITH STENT PLACEMENT  08/1997  _2    ptca w/ stenting to rca   CORONARY ARTERY BYPASS GRAFT  06-06-1998  _3     LIMA -- LAD, SVG -- Diagonal,  RIMA to RCA   CYSTOSCOPY N/A 03/09/2020   Procedure: CYSTOSCOPY FLEXIBLE;  Surgeon: Jacob Seal, MD;  Location: [facility];  Service: Urology;  Laterality: N/A;  NO SEEDS FOUND IN BLADDER   EYE SURGERY     INGUINAL HERNIA REPAIR Right 1974   LOOP RECORDER INSERTION N/A 08/30/2020   Procedure: LOOP RECORDER INSERTION;  Surgeon: Jacob Epley, MD;  Location: [facility];  Service: Cardiovascular;  Laterality: N/A;   PROSTATE BIOPSY  06/2017   x 4-5    RADIOACTIVE SEED IMPLANT N/A 03/09/2020   Procedure: RADIOACTIVE SEED IMPLANT/BRACHYTHERAPY IMPLANT;  Surgeon: Jacob Seal, MD;  Location: [facility];  Service: Urology;  Laterality: N/A;   70  SEEDS IMPLANTED   SPACE OAR INSTILLATION N/A 03/09/2020   Procedure: SPACE OAR INSTILLATION;  Surgeon: Jacob Seal, MD;  Location: [facility];  Service: Urology;  Laterality: N/A;   VENTRAL HERNIA REPAIR  09/ 2000 and recurrent repair 06/ 2001   FAMILY HISTORY Family History  Problem Relation Age of Onset   Lung cancer Mother    Dementia Father    Hyperlipidemia Father     Congestive Heart Failure Father    Post-traumatic stress disorder Son    Alcohol abuse Maternal Grandfather    Cancer Maternal Grandfather 90       stomach cancer    Heart attack Neg Hx    Stroke Neg Hx    Breast cancer Neg Hx    Colon cancer Neg Hx    Prostate cancer Neg Hx    Pancreatic cancer Neg Hx    SOCIAL HISTORY Social History   Tobacco Use   Smoking status: Former    Packs/day: 4.00    Years: 45.00    Total pack years:  180.00    Types: Cigarettes    Quit date: 01/13/1997    Years since quitting: 24.8   Smokeless tobacco: Never   Tobacco comments:    reports he began smoking at age 51  Vaping Use   Vaping Use: Never used  Substance Use Topics   Alcohol use: No   Drug use: Never     There were no vitals filed for this visit.     OPHTHALMIC EXAM:  Base Eye Exam     Visual Acuity (Snellen - Linear)       Right Left   Dist [place] 20/20 -1 20/20 -2         Tonometry (Tonopen, 8:45 AM)       Right Left   Pressure 13 16         Pupils       Dark Light Shape React APD   Right 4 3 Round Brisk None   Left 4 3 Round Brisk None         Visual Fields       Left Right    Full Full         Extraocular Movement       Right Left    Full, Ortho Full, Ortho         Neuro/Psych     Oriented x3: Yes   Mood/Affect: Normal         Dilation     Both eyes: 1.0% Mydriacyl, 2.5% Phenylephrine @ 8:42 AM           Slit Lamp and Fundus Exam     Slit Lamp Exam       Right Left   Lids/Lashes Dermatochalasis - upper lid, Meibomian gland dysfunction Dermatochalasis - upper lid   Conjunctiva/Sclera nasal and temporal pinguecula nasal and temporal pinguecula   Cornea arcus, well healed cataract wound, fine endo pigment, mild EBMD mild arcus, well healed cataract wound   Anterior Chamber deep, clear, narrow temporal angle deep, clear, narrow temporal angle   Iris Round and dilated, mild PPM inferiorly Round and dilated, mild PPM inferiorly, No  NVI   Lens PC IOL in good position PC IOL in good position   Anterior Vitreous Vitreous syneresis mild syneresis         Fundus Exam       Right Left   Disc Pink and Sharp mild Pallor, Sharp rim, temporal PPP   C/D Ratio 0.5 0.7   Macula Flat, Blunted foveal reflex, mild RPE mottling, No heme or edema Flat, good foveal reflex, mild atrophy superior mac, no heme   Vessels attenuated, Tortuous mild attenuation, mild tortuosity, mild copper wiring   Periphery Attached, No heme Attached, rare DBH superiorly            IMAGING AND PROCEDURES  Imaging and Procedures for 11/20/2021  OCT, Retina - OU - Both Eyes       Right Eye Quality was good. Central Foveal Thickness: 293. Progression has been stable. Findings include normal foveal contour, no IRF, no SRF, vitreomacular adhesion .   Left Eye Quality was good. Central Foveal Thickness: 255. Progression has improved. Findings include no IRF, no SRF, abnormal foveal contour, intraretinal hyper-reflective material, inner retinal atrophy (inner retinal hyper reflectivity superior macula - improved; inner retinal atrophy superior macula, partial PVD).   Notes *Images captured and stored on drive  Diagnosis / Impression:  OD: NFP, no IRF/SRF OS: superior BRAO -- inner retinal hyper reflectivity superior macula -  improved; inner retinal atrophy superior macula, partial PVD  Clinical management:  See below  Abbreviations: NFP - Normal foveal profile. CME - cystoid macular edema. PED - pigment epithelial detachment. IRF - intraretinal fluid. SRF - subretinal fluid. EZ - ellipsoid zone. ERM - epiretinal membrane. ORA - outer retinal atrophy. ORT - outer retinal tubulation. SRHM - subretinal hyper-reflective material. IRHM - intraretinal hyper-reflective material            ASSESSMENT/PLAN:    ICD-10-CM   1. BRAO (branch retinal artery occlusion), left  H34.232 OCT, Retina - OU - Both Eyes    2. Essential hypertension  I10      3. Hypertensive retinopathy of both eyes  H35.033     4. Pseudophakia, both eyes  Z96.1       1,2. BRAO OS - pt reports history of painless inferior visual field loss OS -- onset August 27, 2020 - exam showed mild retinal whitening of superior macula and fovea; no emboli noted - FA (08.16.22) shows severely delayed filling time, but eventual perfusion of all retinal vessels OS - BCVA 20/20 from 20/40 - OCT OS shows superior BRAO -- inner retinal hyper reflectivity superior macula - improved; inner retinal atrophy superior macula, partial PVD   - ROS for GCA negative -- no headache, jaw claudication, scalp tenderness, numbness/tingling - pt was sent to ED for stroke work up on 8.16.22 -- admitted and MRI showed L parietal stroke -- started on plavix - exam today shows superior macular atrophy - no neovascularization; no NVG or elevated IOP - no retinal or ophthalmic intervention indicated or recommended at this time - F/U 1 year sooner prn -- DFE/OCT  3,4. Hypertensive retinopathy OU - discussed importance of tight BP control - monitor   6. Pseudophakia OU  - s/p CE/IOL (Jacob Rios, CEA, OS: Dec. 2020, OD: Sept, 2023)  - IOLs in good position, doing well  - monitor   Ophthalmic Meds Ordered this visit:  No orders of the defined types were placed in this encounter.    Return in about 1 year (around 11/21/2022) for f/u BRAO OS, DFE, OCT.  There are no Patient Instructions on file for this visit.   Explained the diagnoses, plan, and follow up with the patient and they expressed understanding.  Patient expressed understanding of the importance of proper follow up care.   This document serves as a record of services personally performed by Gardiner Sleeper, MD, PhD. It was created on their behalf by Orvan Falconer, an ophthalmic technician. The creation of this record is the provider's dictation and/or activities during the visit.    Electronically signed by: Orvan Falconer,  OA, 11/21/21  10:47 PM  This document serves as a record of services personally performed by Gardiner Sleeper, MD, PhD. It was created on their behalf by San Jetty. Owens Shark, OA an ophthalmic technician. The creation of this record is the provider's dictation and/or activities during the visit.    Electronically signed by: San Jetty. Marguerita Merles 11.08.2023 10:47 PM   Gardiner Sleeper, M.D., Ph.D. Diseases & Surgery of the Retina and Vitreous [facility]  I have reviewed the above documentation for accuracy and completeness, and I agree with the above. Gardiner Sleeper, M.D., Ph.D. 11/21/21 10:47 PM   Abbreviations: M myopia (nearsighted); A astigmatism; H hyperopia (farsighted); P presbyopia; Mrx spectacle prescription;  CTL contact lenses; OD right eye; OS left eye; OU both eyes  XT exotropia; ET  esotropia; PEK punctate epithelial keratitis; PEE punctate epithelial erosions; DES dry eye syndrome; MGD meibomian gland dysfunction; ATs artificial tears; PFAT's preservative free artificial tears; [place] nuclear sclerotic cataract; PSC posterior subcapsular cataract; ERM epi-retinal membrane; PVD posterior vitreous detachment; RD retinal detachment; DM diabetes mellitus; DR diabetic retinopathy; NPDR non-proliferative diabetic retinopathy; PDR proliferative diabetic retinopathy; CSME clinically significant macular edema; DME diabetic macular edema; dbh dot blot hemorrhages; CWS cotton wool spot; POAG primary open angle glaucoma; C/D cup-to-disc ratio; HVF humphrey visual field; GVF goldmann visual field; OCT optical coherence tomography; IOP intraocular pressure; BRVO Branch retinal vein occlusion; CRVO central retinal vein occlusion; CRAO central retinal artery occlusion; BRAO branch retinal artery occlusion; RT retinal tear; SB scleral buckle; PPV pars plana vitrectomy; VH Vitreous hemorrhage; PRP panretinal laser photocoagulation; IVK intravitreal kenalog; VMT vitreomacular traction; MH  Macular hole;  NVD neovascularization of the disc; NVE neovascularization elsewhere; AREDS age related eye disease study; ARMD age related macular degeneration; POAG primary open angle glaucoma; EBMD epithelial/anterior basement membrane dystrophy; ACIOL anterior chamber intraocular lens; IOL intraocular lens; PCIOL posterior chamber intraocular lens; Phaco/IOL phacoemulsification with intraocular lens placement; [place] photorefractive keratectomy; LASIK laser assisted in situ keratomileusis; HTN hypertension; DM diabetes mellitus; COPD chronic obstructive pulmonary disease

## 2021-11-20 ENCOUNTER — Ambulatory Visit (INDEPENDENT_AMBULATORY_CARE_PROVIDER_SITE_OTHER): Payer: Medicare PPO | Admitting: Ophthalmology

## 2021-11-20 DIAGNOSIS — Z961 Presence of intraocular lens: Secondary | ICD-10-CM

## 2021-11-20 DIAGNOSIS — I10 Essential (primary) hypertension: Secondary | ICD-10-CM

## 2021-11-20 DIAGNOSIS — H35033 Hypertensive retinopathy, bilateral: Secondary | ICD-10-CM | POA: Diagnosis not present

## 2021-11-20 DIAGNOSIS — H25811 Combined forms of age-related cataract, right eye: Secondary | ICD-10-CM

## 2021-11-20 DIAGNOSIS — H34232 Retinal artery branch occlusion, left eye: Secondary | ICD-10-CM | POA: Diagnosis not present

## 2021-11-21 ENCOUNTER — Encounter (INDEPENDENT_AMBULATORY_CARE_PROVIDER_SITE_OTHER): Payer: Self-pay | Admitting: Ophthalmology

## 2021-11-25 ENCOUNTER — Ambulatory Visit (INDEPENDENT_AMBULATORY_CARE_PROVIDER_SITE_OTHER): Payer: Medicare PPO

## 2021-11-25 DIAGNOSIS — I639 Cerebral infarction, unspecified: Secondary | ICD-10-CM | POA: Diagnosis not present

## 2021-11-26 LAB — CUP PACEART REMOTE DEVICE CHECK
Date Time Interrogation Session: 20231112230500
Implantable Pulse Generator Implant Date: 20220818

## 2021-12-30 ENCOUNTER — Ambulatory Visit (INDEPENDENT_AMBULATORY_CARE_PROVIDER_SITE_OTHER): Payer: Medicare PPO

## 2021-12-30 DIAGNOSIS — I639 Cerebral infarction, unspecified: Secondary | ICD-10-CM | POA: Diagnosis not present

## 2021-12-31 LAB — CUP PACEART REMOTE DEVICE CHECK
Date Time Interrogation Session: 20231215230411
Implantable Pulse Generator Implant Date: 20220818

## 2022-01-02 ENCOUNTER — Other Ambulatory Visit: Payer: Medicare PPO

## 2022-01-02 DIAGNOSIS — C61 Malignant neoplasm of prostate: Secondary | ICD-10-CM | POA: Diagnosis not present

## 2022-01-03 LAB — PSA: Prostate Specific Ag, Serum: 0.5 ng/mL (ref 0.0–4.0)

## 2022-01-07 NOTE — Progress Notes (Signed)
[facility] Summary Report / Loop Recorder 

## 2022-01-09 ENCOUNTER — Ambulatory Visit: Payer: Medicare PPO | Admitting: Urology

## 2022-01-28 ENCOUNTER — Telehealth: Payer: Self-pay | Admitting: Nurse Practitioner

## 2022-01-28 NOTE — Telephone Encounter (Signed)
Patient declined the Medicare Wellness Visit with [facility]  Stated that he was told he did not have to complete and did not want to do.

## 2022-01-30 ENCOUNTER — Encounter: Payer: Self-pay | Admitting: Family Medicine

## 2022-01-30 ENCOUNTER — Ambulatory Visit: Payer: Medicare PPO | Admitting: Family Medicine

## 2022-01-30 ENCOUNTER — Ambulatory Visit: Payer: Medicare PPO | Admitting: Urology

## 2022-01-30 VITALS — BP 144/70 | HR 72 | Temp 98.2°F | Ht 71.0 in | Wt 221.0 lb

## 2022-01-30 VITALS — BP 120/78 | HR 76

## 2022-01-30 DIAGNOSIS — R351 Nocturia: Secondary | ICD-10-CM

## 2022-01-30 DIAGNOSIS — J3489 Other specified disorders of nose and nasal sinuses: Secondary | ICD-10-CM | POA: Diagnosis not present

## 2022-01-30 DIAGNOSIS — C61 Malignant neoplasm of prostate: Secondary | ICD-10-CM | POA: Diagnosis not present

## 2022-01-30 DIAGNOSIS — Z8546 Personal history of malignant neoplasm of prostate: Secondary | ICD-10-CM

## 2022-01-30 DIAGNOSIS — N403 Nodular prostate with lower urinary tract symptoms: Secondary | ICD-10-CM | POA: Diagnosis not present

## 2022-01-30 DIAGNOSIS — H60331 Swimmer's ear, right ear: Secondary | ICD-10-CM

## 2022-01-30 DIAGNOSIS — H6121 Impacted cerumen, right ear: Secondary | ICD-10-CM

## 2022-01-30 LAB — URINALYSIS, ROUTINE W REFLEX MICROSCOPIC
Bilirubin, UA: NEGATIVE
Glucose, UA: NEGATIVE
Ketones, UA: NEGATIVE
Leukocytes,UA: NEGATIVE
Nitrite, UA: NEGATIVE
RBC, UA: NEGATIVE
Specific Gravity, UA: 1.02 (ref 1.005–1.030)
Urobilinogen, Ur: 8 mg/dL — ABNORMAL HIGH (ref 0.2–1.0)
pH, UA: 6.5 (ref 5.0–7.5)

## 2022-01-30 LAB — CUP PACEART REMOTE DEVICE CHECK
Date Time Interrogation Session: 20240117231051
Implantable Pulse Generator Implant Date: 20220818

## 2022-01-30 LAB — MICROSCOPIC EXAMINATION
Bacteria, UA: NONE SEEN
RBC, Urine: NONE SEEN /hpf (ref 0–2)
WBC, UA: NONE SEEN /hpf (ref 0–5)

## 2022-01-30 MED ORDER — NEOMYCIN-POLYMYXIN-HC 3.5-10000-1 OT SOLN: 3.0000 [drp] | Freq: Four times a day (QID) | OTIC | 0 refills | Status: AC

## 2022-01-30 MED ORDER — FLUTICASONE PROPIONATE 50 MCG/ACT NA SUSP: 1.0000 | Freq: Two times a day (BID) | NASAL | 6 refills | Status: DC | PRN

## 2022-01-30 NOTE — Progress Notes (Signed)
Subjective:  1. History of prostate cancer   2. Nodular prostate with lower urinary tract symptoms   3. Nocturia     01/30/22: Jacob Rios returns today in f/u.  His PSA is down further to 0.5.  His IPSS is 5 with nocturia x 3.  He has had some intermittent diarrhea but has been constipated for 4 days.  His weight is down but he has no bone pain.  He has no hematuria.   07/04/21: Jacob Rios returns today in f/u.   His PSA is down to 0.9 from 1.2 in 12/22.  It was 13.3 prior to the seed implant on 03/09/20.   He continues to have nocturia 3-4x.  He has had no hematuria or dysuria.  He has rare constipation but no bloody stools.  He has no weight loss or bone pain.  He has arthralgias.  His UA is clear today. His IPSS is 13.  01/03/21: Jacob Rios returns in f/u for the history below.  His PSA is down further to 1.2.  He is voiding well but has nocturia x 4 which is stable.  His IPSS is 6.  He had a stroke of the left eye since the last visit.  He was hospitalized with that for a week in August.   He had a Loop recorder placed in August for further monitoring and is now on Plavix.  He has had partial recovery of his vision.  UA is clear.     07/05/20: Jacob Rios returns today in f/u for his history of prostate cancer.  His PSA has fallen to 3.0 from 13.3 prior to treatment.   His IPSS is 10 with nocturia x 4.   His UA is ok today.    03/29/20: Jacob Rios returns today in f/u from his recent seed implant on 03/09/20.  He is voiding well with an IPSS of 8. He has nocturia x 4.  He has some hesitancy.  His UA has >30 RBC's.   He has felt a lump about an inch up in the rectum that is probably the Bl[place]key.  He has mild dysuria.     11/22/19: Jacob Rios returns today following a prostate biopsy for his history of low risk prostate cancer on surveillance.   The biopsy demonstrated 3 positive cores with Gleason 6 in the right and left apical lateral cores and Gleason 7(3+4) in the right apical medial core.   The prostate volume was  49m.    His most recent PSA was 13.3 and has been rising.   He had a UTI and went to the ER in late July 2020 and was given antibiotics but had Mx species on the culture.    He has not had any further UTI's.  He passed a stone last year.  His IPSS is10.  His UA today is clear.   He had an MRI fusion biopsy in 3/19 for surveillance of his low risk prostate cancer. He had a single core of 20% of gleason 6 disease in the left lateral apex. he had atypia in one core. His PSA was 8.5 in 9/19 and 9.0 in 1/19. He has no associated signs or symptoms.   He was found to have a T2a Nx Mx Gleason 6 prostate cancer with 5% in one left apical and one right apical core in 07/26/13. A repeat biopsy in 8/16 had a single core of Gleason 6 in the left mid lateral gland with 10% involvement. His PSA prior to this visit is up to 8.4  from 6.6 in 5/18. It had been stable for the last 2 years. His Prostate volume was 13m initally and 322mon the repeat. He has a history of a 1069might base prostate nodule and BOO. He has no associated signs or symptoms.  IPSS     Row Name 01/30/22 1300         International Prostate Symptom Score   How often have you had the sensation of not emptying your bladder? Not at All     How often have you had to urinate less than every two hours? Less than 1 in 5 times     How often have you found you stopped and started again several times when you urinated? Less than 1 in 5 times     How often have you found it difficult to postpone urination? Not at All     How often have you had a weak urinary stream? Not at All     How often have you had to strain to start urination? Not at All     How many times did you typically get up at night to urinate? 3 Times     Total IPSS Score 5       Quality of Life due to urinary symptoms   If you were to spend the rest of your life with your urinary condition just the way it is now how would you feel about that? Delighted                   ROS:  ROS:  A complete review of systems was performed.  All systems are negative except for pertinent findings as noted.   Review of Systems  HENT:  Positive for sinus pain.     Allergies  Allergen Reactions   Morphine Shortness Of Breath and Swelling    Outpatient Encounter Medications as of 01/30/2022  Medication Sig   atorvastatin (LIPITOR) 40 MG tablet Take 1 tablet (40 mg total) by mouth daily.   Cholecalciferol (D3 ADULT PO) Take 1 tablet by mouth daily.   clopidogrel (PLAVIX) 75 MG tablet Take 1 tablet (75 mg total) by mouth daily.   fluticasone (FLONASE) 50 MCG/ACT nasal spray Place 1 spray into both nostrils 2 (two) times daily as needed for allergies or rhinitis.   glipiZIDE (GLUCOTROL XL) 10 MG 24 hr tablet Take 1 tablet (10 mg total) by mouth daily.   glucose blood (ONETOUCH VERIO) test strip Test 1X per day and as needed  Dx 250.02   Lancets (ONETOUCH ULTRASOFT) lancets Patient test 1X per day and prn  Dx 250.02   lisinopril (ZESTRIL) 2.5 MG tablet Take 1 tablet (2.5 mg total) by mouth daily.   metFORMIN (GLUCOPHAGE) 1000 MG tablet Take 1 tablet (1,000 mg total) by mouth 2 (two) times daily with a meal.   metoprolol succinate (TOPROL-XL) 50 MG 24 hr tablet TAKE 1 TABLET BY MOUTH ONCE DAILY WITH OR IMMEDIATELY FOLLOWING A MEAL   neomycin-polymyxin-hydrocortisone (CORTISPORIN) OTIC solution Place 3 drops into the right ear 4 (four) times daily for 7 days.   omeprazole (PRILOSEC) 40 MG capsule Take 1 capsule (40 mg total) by mouth daily.   vitamin C (ASCORBIC ACID) 500 MG tablet Take 500 mg by mouth daily.   No facility-administered encounter medications on file as of 01/30/2022.    Past Medical History:  Diagnosis Date   Cataract    CKD (chronic kidney disease), [place]  [place]  Coronary artery disease cardiologist--- dr Angelena Form   08/ 1999  s/p  cath w/ PTCA and stenting to RCA;   05/ 2000 inferior wall MI , 06-06-1998 s/p cabg x3;   Last  heart cath 2005,  2 patent grafts (diagnol and RCA) and occluded LIMA--LAD graft with normal LAD nonobstructive disease;  last nuclear study 11/ 2014 no evidence ishcemia, ef 40%   Full dentures    Hiatal hernia    History of acute inferior wall MI 05/1998   s/p  cabg   Hypertension    Hypertensive retinopathy    Mixed hyperlipidemia    Nocturia more than twice per night    PAD (peripheral artery disease) (HCC)    left common iliac artery stenosis per aorta ultrasound 03/ 2021 in epic and cardiology note   Prostate cancer [facility]) urologist--- dr Jeffie Pollock   first dx 07/ 2015 in active survillance until bx 11-11-2019,  Stage T2a, Gleason 3+4, PSA 13.3   S/P CABG x 3 06/06/1998   LIMA--LAD, SVG to Diagonal, RIMA to RCA   S/P primary angioplasty with coronary stent 08/1997   stent to RCA   Type 2 diabetes mellitus ([place])    followed by pcp  (03-06-2020 checks blood sugar dialy in am,  fasting sugar-- 110-130)    Past Surgical History:  Procedure Laterality Date   APPENDECTOMY  child   CARDIAC CATHETERIZATION  09/01/2000  '@[facility]'$    patent RCA and Diagonal grafts, atretic LIMA, moderate nonobstructive proxLAD, normal lvsf   CARDIAC CATHETERIZATION  04-21-2003  '@[facility]    LIMA--LAD graft occluded, native LAD with nonobstructive disease,  patent diagonal/ rca grafts   CATARACT EXTRACTION     CATARACT EXTRACTION W/ INTRAOCULAR LENS IMPLANT Right 12/2018   CORONARY ANGIOPLASTY WITH STENT PLACEMENT  08/1997  '@[facility]    ptca w/ stenting to rca   CORONARY ARTERY BYPASS GRAFT  06-06-1998  '@[facility]     LIMA -- LAD, SVG -- Diagonal,  RIMA to RCA   CYSTOSCOPY N/A 03/09/2020   Procedure: CYSTOSCOPY FLEXIBLE;  Surgeon: [name], MD;  Location: [facility];  Service: Urology;  Laterality: N/A;  NO SEEDS FOUND IN BLADDER   EYE SURGERY     INGUINAL HERNIA REPAIR Right 1974   LOOP RECORDER INSERTION N/A 08/30/2020   Procedure: LOOP RECORDER INSERTION;  Surgeon: Vickie Epley, MD;  Location: [facility];  Service: Cardiovascular;  Laterality: N/A;   PROSTATE BIOPSY  06/2017   x 4-5    RADIOACTIVE SEED IMPLANT N/A 03/09/2020   Procedure: RADIOACTIVE SEED IMPLANT/BRACHYTHERAPY IMPLANT;  Surgeon: [name], MD;  Location: [facility];  Service: Urology;  Laterality: N/A;   70  SEEDS IMPLANTED   SPACE OAR INSTILLATION N/A 03/09/2020   Procedure: SPACE OAR INSTILLATION;  Surgeon: [name], MD;  Location: [facility];  Service: Urology;  Laterality: N/A;   VENTRAL HERNIA REPAIR  09/ 2000 and recurrent repair 06/ 2001    Social History   Socioeconomic History   Marital status: Married    Spouse name: Vaughan Basta    Number of children: 3   Years of education: Not on file   Highest education level: Not on file  Occupational History   Occupation: part-time trucker    Comment: retired   Tobacco Use   Smoking status: Former    Packs/day: 4.00    Years: 45.00    Total pack years: 180.00    Types: Cigarettes    Quit date: 01/13/1997  Years since quitting: 25.0   Smokeless tobacco: Never   Tobacco comments:    reports he began smoking at age 36  Vaping Use   Vaping Use: Never used  Substance and Sexual Activity   Alcohol use: No   Drug use: Never   Sexual activity: Not on file  Other Topics Concern   Not on file  Social History Narrative   Lives home with wife   Social Determinants of Health   Financial Resource Strain: Low Risk  (06/21/2020)   Overall Financial Resource Strain (CARDIA)    Difficulty of Paying Living Expenses: Not very hard  Food Insecurity: No Food Insecurity (06/21/2020)   Hunger Vital Sign    Worried About Running Out of Food in the Last Year: Never true    Ran Out of Food in the Last Year: Never true  Transportation Needs: No Transportation Needs (06/21/2020)   PRAPARE - Hydrologist (Medical): No    Lack of Transportation (Non-Medical): No  Physical Activity: Sufficiently Active (06/21/2020)    Exercise Vital Sign    Days of Exercise per Week: 7 days    Minutes of Exercise per Session: 30 min  Stress: No Stress Concern Present (06/21/2020)   [place]    Feeling of Stress : Only a little  Social Connections: Socially Integrated (06/21/2020)   Social Connection and Isolation Panel [NHANES]    Frequency of Communication with Friends and Family: More than three times a week    Frequency of Social Gatherings with Friends and Family: More than three times a week    Attends Religious Services: More than 4 times per year    Active Member of [employer] or Organizations: Yes    Attends Music therapist: More than 4 times per year    Marital Status: Married  Human resources officer Violence: Not At Risk (06/21/2020)   Humiliation, Afraid, Rape, and Kick questionnaire    Fear of Current or Ex-Partner: No    Emotionally Abused: No    Physically Abused: No    Sexually Abused: No    Family History  Problem Relation Age of Onset   Lung cancer Mother    Dementia Father    Hyperlipidemia Father    Congestive Heart Failure Father    Post-traumatic stress disorder Son    Alcohol abuse Maternal Grandfather    Cancer Maternal Grandfather 90       stomach cancer    Heart attack Neg Hx    Stroke Neg Hx    Breast cancer Neg Hx    Colon cancer Neg Hx    Prostate cancer Neg Hx    Pancreatic cancer Neg Hx        Objective: Vitals:   01/30/22 1334  BP: 120/78  Pulse: 76      Physical Exam  Lab Results:  Lab Results  Component Value Date   PSA1 0.5 01/02/2022   PSA1 0.9 08/02/2021   PSA1 0.9 06/27/2021       BMET No results for input(s): "NA", "K", "CL", "CO2", "GLUCOSE", "BUN", "CREATININE", "CALCIUM" in the last 72 hours. PSA PSA  Date Value Ref Range Status  03/30/2019 10.4 (H) < OR = 4.0 ng/mL Final    Comment:    The total PSA value from this assay system is  standardized against the WHO standard.  The test  result will be approximately 20% lower when compared  to the equimolar-standardized  total PSA (Beckman  Coulter). Comparison of serial PSA results should be  interpreted with this fact in mind. . This test was performed using the Siemens  chemiluminescent method. Values obtained from  different assay methods cannot be used interchangeably. PSA levels, regardless of value, should not be interpreted as absolute evidence of the presence or absence of disease.   11/18/2013 5.0 (H) 0.0 - 4.0 ng/mL Final    Comment:    Roche ECLIA methodology. According to the American Urological Association, Serum PSA should decrease and remain at undetectable levels after radical prostatectomy. The AUA defines biochemical recurrence as an initial PSA value 0.2 ng/mL or greater followed by a subsequent confirmatory PSA value 0.2 ng/mL or greater. Values obtained with different assay methods or kits cannot be used interchangeably. Results cannot be interpreted as absolute evidence of the presence or absence of malignant disease.   05/20/2013 5.3 (H) 0.0 - 4.0 ng/mL Final    Comment:    Roche ECLIA methodology. According to the American Urological Association, Serum PSA should decrease and remain at undetectable levels after radical prostatectomy. The AUA defines biochemical recurrence as an initial PSA value 0.2 ng/mL or greater followed by a subsequent confirmatory PSA value 0.2 ng/mL or greater. Values obtained with different assay methods or kits cannot be used interchangeably. Results cannot be interpreted as absolute evidence of the presence or absence of malignant disease.   No results found for: "TESTOSTERONE"  Lab Results  Component Value Date   PSA1 0.5 01/02/2022   PSA1 0.9 08/02/2021   PSA1 0.9 06/27/2021      Studies/Results:     Assessment & Plan: T2a Nx Mx Gleason 7(3+4) disease s/p seed implant.   He is doing well post op with a nice decline in the PSA.  He  with return in 6 months with a PSA.   Microhematuria.  Resolved.  Nodular prostate with LUTS.   He has stable nocturia.   No orders of the defined types were placed in this encounter.     Orders Placed This Encounter  Procedures   Microscopic Examination   Urinalysis, Routine w reflex microscopic   PSA    Standing Status:   Future    Standing Expiration Date:   01/31/2023      Return in about 6 months (around 07/31/2022) for with PSA.   CC: Jacob Pretty, FNP      [name] 01/31/2022 Patient ID: Jacob Rios, male   DOB: 02/22/1945, 77 y.o.   MRN: [id number]

## 2022-01-30 NOTE — Progress Notes (Signed)
BP (!) 144/70   Pulse 72   Temp 98.2 F (36.8 C)   Ht '5\' 11"'$  (1.803 m)   Wt 221 lb (100.2 kg)   SpO2 98%   BMI 30.82 kg/m    Subjective:   Patient ID: Jacob Rios, male    DOB: 1945/07/11, 77 y.o.   MRN: [id number]  HPI: Jacob Rios is a 77 y.o. male presenting on 01/30/2022 for ear itching (And burning/) and Sore Throat   HPI Left ear and right ear pressure, right ear is worse.  He is also been having drainage coming out of the right ear.  He has been using sweet oil drops to help in both the ears.  He is also got burning and itching in both the ears and he is also feels like he has a postnasal drainage which is causing him to have a sore throat.  He does have some sinus pressure but he fights that all the time.  He denies any fevers or chills or shortness of breath or wheezing.  Relevant past medical, surgical, family and social history reviewed and updated as indicated. Interim medical history since our last visit reviewed. Allergies and medications reviewed and updated.  Review of Systems  Constitutional:  Negative for chills and fever.  HENT:  Positive for congestion, ear pain, hearing loss, postnasal drip, sinus pressure, sinus pain and sore throat. Negative for sneezing.   Eyes:  Negative for discharge.  Respiratory:  Negative for cough, shortness of breath and wheezing.   Cardiovascular:  Negative for chest pain and leg swelling.  Musculoskeletal:  Negative for back pain and gait problem.  Skin:  Negative for rash.  All other systems reviewed and are negative.   Per HPI unless specifically indicated above   Allergies as of 01/30/2022       Reactions   Morphine Shortness Of Breath, Swelling        Medication List        Accurate as of January 30, 2022 12:36 PM. If you have any questions, ask your nurse or doctor.          ascorbic acid 500 MG tablet Commonly known as: VITAMIN C Take 500 mg by mouth daily.   atorvastatin 40 MG tablet Commonly  known as: LIPITOR Take 1 tablet (40 mg total) by mouth daily.   clopidogrel 75 MG tablet Commonly known as: PLAVIX Take 1 tablet (75 mg total) by mouth daily.   D3 ADULT PO Take 1 tablet by mouth daily.   fluticasone 50 MCG/ACT nasal spray Commonly known as: FLONASE Place 1 spray into both nostrils 2 (two) times daily as needed for allergies or rhinitis. Started by: Fransisca Kaufmann Carys Malina, MD   glipiZIDE 10 MG 24 hr tablet Commonly known as: GLUCOTROL XL Take 1 tablet (10 mg total) by mouth daily.   glucose blood test strip Commonly known as: OneTouch Verio Test 1X per day and as needed  Dx 250.02   lisinopril 2.5 MG tablet Commonly known as: ZESTRIL Take 1 tablet (2.5 mg total) by mouth daily.   metFORMIN 1000 MG tablet Commonly known as: GLUCOPHAGE Take 1 tablet (1,000 mg total) by mouth 2 (two) times daily with a meal.   metoprolol succinate 50 MG 24 hr tablet Commonly known as: TOPROL-XL TAKE 1 TABLET BY MOUTH ONCE DAILY WITH OR IMMEDIATELY FOLLOWING A MEAL   omeprazole 40 MG capsule Commonly known as: PRILOSEC Take 1 capsule (40 mg total) by mouth daily.  onetouch ultrasoft lancets Patient test 1X per day and prn  Dx 250.02         Objective:   BP (!) 144/70   Pulse 72   Temp 98.2 F (36.8 C)   Ht '5\' 11"'$  (1.803 m)   Wt 221 lb (100.2 kg)   SpO2 98%   BMI 30.82 kg/m   Wt Readings from Last 3 Encounters:  01/30/22 221 lb (100.2 kg)  10/03/21 235 lb (106.6 kg)  08/02/21 225 lb (102.1 kg)    Physical Exam Vitals and nursing note reviewed.  Constitutional:      General: He is not in acute distress.    Appearance: He is well-developed. He is not diaphoretic.  HENT:     Right Ear: External ear normal. There is impacted cerumen.     Left Ear: Tympanic membrane, ear canal and external ear normal.     Nose:     Right Sinus: Frontal sinus tenderness present. No maxillary sinus tenderness.     Left Sinus: Frontal sinus tenderness present. No maxillary  sinus tenderness.     Mouth/Throat:     Pharynx: Uvula midline. No oropharyngeal exudate or posterior oropharyngeal erythema.     Tonsils: No tonsillar abscesses.  Eyes:     General: No scleral icterus.    Conjunctiva/sclera: Conjunctivae normal.  Neck:     Thyroid: No thyromegaly.  Musculoskeletal:        General: Normal range of motion.  Skin:    General: Skin is warm and dry.     Findings: No rash.  Neurological:     Mental Status: He is alert and oriented to person, place, and time.     Coordination: Coordination normal.  Psychiatric:        Behavior: Behavior normal.     Cerumen lavage: Nurse to lavage cerumen on right ear.  Patient tolerated well.  Assessment & Plan:   Problem List Items Addressed This Visit   None Visit Diagnoses     Hearing loss of right ear due to cerumen impaction    -  Primary   Relevant Medications   fluticasone (FLONASE) 50 MCG/ACT nasal spray   Sinus pressure       Relevant Medications   fluticasone (FLONASE) 50 MCG/ACT nasal spray       Recommended Flonase for his chronic sinus pressure.  May also use allergy pill over-the-counter.  After lavage able to see that there is some irritation and inflammation in the ear canal, possibly infection, sent antibiotic eardrop for him. Follow up plan: Return if symptoms worsen or fail to improve.  Counseling provided for all of the vaccine components No orders of the defined types were placed in this encounter.   Caryl Pina, MD [facility] 01/30/2022, 12:36 PM

## 2022-01-31 ENCOUNTER — Encounter: Payer: Self-pay | Admitting: Urology

## 2022-02-03 ENCOUNTER — Ambulatory Visit: Payer: Medicare PPO | Admitting: Nurse Practitioner

## 2022-02-03 ENCOUNTER — Ambulatory Visit: Payer: Medicare PPO | Attending: Cardiology

## 2022-02-03 ENCOUNTER — Encounter: Payer: Self-pay | Admitting: Nurse Practitioner

## 2022-02-03 VITALS — BP 117/67 | HR 79 | Temp 97.8°F | Resp 20 | Ht 71.0 in | Wt 218.0 lb

## 2022-02-03 DIAGNOSIS — E782 Mixed hyperlipidemia: Secondary | ICD-10-CM

## 2022-02-03 DIAGNOSIS — I639 Cerebral infarction, unspecified: Secondary | ICD-10-CM | POA: Diagnosis not present

## 2022-02-03 DIAGNOSIS — I10 Essential (primary) hypertension: Secondary | ICD-10-CM | POA: Diagnosis not present

## 2022-02-03 DIAGNOSIS — N183 Chronic kidney disease, stage 3 unspecified: Secondary | ICD-10-CM | POA: Diagnosis not present

## 2022-02-03 DIAGNOSIS — Z6835 Body mass index (BMI) 35.0-35.9, adult: Secondary | ICD-10-CM

## 2022-02-03 DIAGNOSIS — K219 Gastro-esophageal reflux disease without esophagitis: Secondary | ICD-10-CM | POA: Diagnosis not present

## 2022-02-03 DIAGNOSIS — E119 Type 2 diabetes mellitus without complications: Secondary | ICD-10-CM

## 2022-02-03 DIAGNOSIS — I6930 Unspecified sequelae of cerebral infarction: Secondary | ICD-10-CM

## 2022-02-03 DIAGNOSIS — I25810 Atherosclerosis of coronary artery bypass graft(s) without angina pectoris: Secondary | ICD-10-CM | POA: Diagnosis not present

## 2022-02-03 DIAGNOSIS — E1122 Type 2 diabetes mellitus with diabetic chronic kidney disease: Secondary | ICD-10-CM | POA: Diagnosis not present

## 2022-02-03 DIAGNOSIS — C61 Malignant neoplasm of prostate: Secondary | ICD-10-CM | POA: Diagnosis not present

## 2022-02-03 DIAGNOSIS — Z8546 Personal history of malignant neoplasm of prostate: Secondary | ICD-10-CM

## 2022-02-03 LAB — CMP14+EGFR
ALT: 15 IU/L (ref 0–44)
AST: 10 IU/L (ref 0–40)
Albumin/Globulin Ratio: 1.4 (ref 1.2–2.2)
Albumin: 3.9 g/dL (ref 3.8–4.8)
Alkaline Phosphatase: 106 IU/L (ref 44–121)
BUN/Creatinine Ratio: 12 (ref 10–24)
BUN: 15 mg/dL (ref 8–27)
Bilirubin Total: 0.3 mg/dL (ref 0.0–1.2)
CO2: 22 mmol/L (ref 20–29)
Calcium: 9.1 mg/dL (ref 8.6–10.2)
Chloride: 100 mmol/L (ref 96–106)
Creatinine, Ser: 1.3 mg/dL — ABNORMAL HIGH (ref 0.76–1.27)
Globulin, Total: 2.8 g/dL (ref 1.5–4.5)
Glucose: 106 mg/dL — ABNORMAL HIGH (ref 70–99)
Potassium: 4.6 mmol/L (ref 3.5–5.2)
Sodium: 138 mmol/L (ref 134–144)
Total Protein: 6.7 g/dL (ref 6.0–8.5)
eGFR: 57 mL/min/{1.73_m2} — ABNORMAL LOW (ref >59)

## 2022-02-03 LAB — LIPID PANEL
Chol/HDL Ratio: 4.1 ratio (ref 0.0–5.0)
Cholesterol, Total: 110 mg/dL (ref 100–199)
HDL: 27 mg/dL — ABNORMAL LOW (ref >39)
LDL Chol Calc (NIH): 61 mg/dL (ref 0–99)
Triglycerides: 121 mg/dL (ref 0–149)
VLDL Cholesterol Cal: 22 mg/dL (ref 5–40)

## 2022-02-03 LAB — CBC WITH DIFFERENTIAL/PLATELET
Basophils Absolute: 0.1 10*3/uL (ref 0.0–0.2)
Basos: 1 %
EOS (ABSOLUTE): 0.3 10*3/uL (ref 0.0–0.4)
Eos: 4 %
Hematocrit: 36 % — ABNORMAL LOW (ref 37.5–51.0)
Hemoglobin: 11.6 g/dL — ABNORMAL LOW (ref 13.0–17.7)
Immature Grans (Abs): 0 10*3/uL (ref 0.0–0.1)
Immature Granulocytes: 0 %
Lymphocytes Absolute: 0.8 10*3/uL (ref 0.7–3.1)
Lymphs: 11 %
MCH: 25.1 pg — ABNORMAL LOW (ref 26.6–33.0)
MCHC: 32.2 g/dL (ref 31.5–35.7)
MCV: 78 fL — ABNORMAL LOW (ref 79–97)
Monocytes Absolute: 0.5 10*3/uL (ref 0.1–0.9)
Monocytes: 6 %
Neutrophils Absolute: 5.6 10*3/uL (ref 1.4–7.0)
Neutrophils: 78 %
Platelets: 306 10*3/uL (ref 150–450)
RBC: 4.63 x10E6/uL (ref 4.14–5.80)
RDW: 14.8 % (ref 11.6–15.4)
WBC: 7.2 10*3/uL (ref 3.4–10.8)

## 2022-02-03 LAB — BAYER DCA HB A1C WAIVED: HB A1C (BAYER DCA - WAIVED): 5.9 % — ABNORMAL HIGH (ref 4.8–5.6)

## 2022-02-03 MED ORDER — METOPROLOL SUCCINATE ER 50 MG PO TB24: ORAL_TABLET | ORAL | 1 refills | Status: DC

## 2022-02-03 MED ORDER — LISINOPRIL 2.5 MG PO TABS: 2.5000 mg | ORAL_TABLET | Freq: Every day | ORAL | 1 refills | Status: DC

## 2022-02-03 MED ORDER — OMEPRAZOLE 40 MG PO CPDR: 40.0000 mg | DELAYED_RELEASE_CAPSULE | Freq: Every day | ORAL | 1 refills | Status: DC

## 2022-02-03 MED ORDER — METFORMIN HCL 1000 MG PO TABS: 1000.0000 mg | ORAL_TABLET | Freq: Two times a day (BID) | ORAL | 1 refills | Status: DC

## 2022-02-03 MED ORDER — ATORVASTATIN CALCIUM 40 MG PO TABS: 40.0000 mg | ORAL_TABLET | Freq: Every day | ORAL | 1 refills | Status: DC

## 2022-02-03 MED ORDER — CLOPIDOGREL BISULFATE 75 MG PO TABS: 75.0000 mg | ORAL_TABLET | Freq: Every day | ORAL | 1 refills | Status: DC

## 2022-02-03 MED ORDER — GLIPIZIDE ER 10 MG PO TB24: 10.0000 mg | ORAL_TABLET | Freq: Every day | ORAL | 1 refills | Status: DC

## 2022-02-03 NOTE — Progress Notes (Signed)
[facility] Summary Report / Loop Recorder

## 2022-02-03 NOTE — Progress Notes (Signed)
Subjective:    Patient ID: Jacob Rios, male    DOB: Feb 12, 1945, 77 y.o.   MRN: [id number]  Chief Complaint: medical management of chronic issues     HPI:  Jacob Rios is a 77 y.o. who identifies as a male who was assigned male at birth.   Social history: Lives with: by himself Work history: retired   Scientist, forensic in today for follow up of the following chronic medical issues:  1. Essential hypertension No c/o chest pain, sob or headache. Doe snot check blood pressure at home. BP Readings from Last 3 Encounters:  01/30/22 120/78  01/30/22 (!) 144/70  10/03/21 (!) 147/78     2. Mixed hyperlipidemia Does not really watch diet very closely Lab Results  Component Value Date   CHOL 116 08/02/2021   HDL 30 (L) 08/02/2021   LDLCALC 71 08/02/2021   TRIG 71 08/02/2021   CHOLHDL 3.9 08/02/2021     3. Gastroesophageal reflux disease without esophagitis Is on omeprazole daily and is doing well.  4. 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 No changes made to plan of care.  5. Type 2 diabetes mellitus without complication, without long-term current use of insulin (HCC) Fasting blood sugars are running around 110-140. Lab Results  Component Value Date   HGBA1C 5.8 (H) 08/02/2021     6. CKD stage 3 due to type 2 diabetes mellitus (HCC) No voiding issues Lab Results  Component Value Date   CREATININE 1.63 (H) 08/02/2021     7. Late effect of cerebrovascular accident (CVA) No permanent effects  8. BMI 35.0-35.9,adult No recent weight changes Wt Readings from Last 3 Encounters:  02/03/22 218 lb (98.9 kg)  01/30/22 221 lb (100.2 kg)  10/03/21 235 lb (106.6 kg)   BMI Readings from Last 3 Encounters:  02/03/22 30.40 kg/m  01/30/22 30.82 kg/m  10/03/21 32.78 kg/m      New complaints: Had ear infection last week. On drops.  Allergies  Allergen Reactions   Morphine Shortness Of Breath and  Swelling   Outpatient Encounter Medications as of 02/03/2022  Medication Sig   atorvastatin (LIPITOR) 40 MG tablet Take 1 tablet (40 mg total) by mouth daily.   Cholecalciferol (D3 ADULT PO) Take 1 tablet by mouth daily.   clopidogrel (PLAVIX) 75 MG tablet Take 1 tablet (75 mg total) by mouth daily.   fluticasone (FLONASE) 50 MCG/ACT nasal spray Place 1 spray into both nostrils 2 (two) times daily as needed for allergies or rhinitis.   glipiZIDE (GLUCOTROL XL) 10 MG 24 hr tablet Take 1 tablet (10 mg total) by mouth daily.   glucose blood (ONETOUCH VERIO) test strip Test 1X per day and as needed  Dx 250.02   Lancets (ONETOUCH ULTRASOFT) lancets Patient test 1X per day and prn  Dx 250.02   lisinopril (ZESTRIL) 2.5 MG tablet Take 1 tablet (2.5 mg total) by mouth daily.   metFORMIN (GLUCOPHAGE) 1000 MG tablet Take 1 tablet (1,000 mg total) by mouth 2 (two) times daily with a meal.   metoprolol succinate (TOPROL-XL) 50 MG 24 hr tablet TAKE 1 TABLET BY MOUTH ONCE DAILY WITH OR IMMEDIATELY FOLLOWING A MEAL   neomycin-polymyxin-hydrocortisone (CORTISPORIN) OTIC solution Place 3 drops into the right ear 4 (four) times daily for 7 days.   omeprazole (PRILOSEC) 40 MG capsule Take 1 capsule (40 mg total) by mouth daily.   vitamin C (ASCORBIC ACID) 500 MG tablet Take 500 mg  by mouth daily.   No facility-administered encounter medications on file as of 02/03/2022.    Past Surgical History:  Procedure Laterality Date   APPENDECTOMY  child   CARDIAC CATHETERIZATION  09/01/2000  '@[facility]    patent RCA and Diagonal grafts, atretic LIMA, moderate nonobstructive proxLAD, normal lvsf   CARDIAC CATHETERIZATION  04-21-2003  '@[facility]    LIMA--LAD graft occluded, native LAD with nonobstructive disease,  patent diagonal/ rca grafts   CATARACT EXTRACTION     CATARACT EXTRACTION W/ INTRAOCULAR LENS IMPLANT Right 12/2018   CORONARY ANGIOPLASTY WITH STENT PLACEMENT  08/1997  '@[facility]    ptca w/ stenting to rca   CORONARY ARTERY  BYPASS GRAFT  06-06-1998  '@[facility]     LIMA -- LAD, SVG -- Diagonal,  RIMA to RCA   CYSTOSCOPY N/A 03/09/2020   Procedure: CYSTOSCOPY FLEXIBLE;  Surgeon: [name], MD;  Location: [facility];  Service: Urology;  Laterality: N/A;  NO SEEDS FOUND IN BLADDER   EYE SURGERY     INGUINAL HERNIA REPAIR Right 1974   LOOP RECORDER INSERTION N/A 08/30/2020   Procedure: LOOP RECORDER INSERTION;  Surgeon: Vickie Epley, MD;  Location: [facility];  Service: Cardiovascular;  Laterality: N/A;   PROSTATE BIOPSY  06/2017   x 4-5    RADIOACTIVE SEED IMPLANT N/A 03/09/2020   Procedure: RADIOACTIVE SEED IMPLANT/BRACHYTHERAPY IMPLANT;  Surgeon: [name], MD;  Location: [facility];  Service: Urology;  Laterality: N/A;   70  SEEDS IMPLANTED   SPACE OAR INSTILLATION N/A 03/09/2020   Procedure: SPACE OAR INSTILLATION;  Surgeon: [name], MD;  Location: [facility];  Service: Urology;  Laterality: N/A;   VENTRAL HERNIA REPAIR  09/ 2000 and recurrent repair 06/ 2001    Family History  Problem Relation Age of Onset   Lung cancer Mother    Dementia Father    Hyperlipidemia Father    Congestive Heart Failure Father    Post-traumatic stress disorder Son    Alcohol abuse Maternal Grandfather    Cancer Maternal Grandfather 90       stomach cancer    Heart attack Neg Hx    Stroke Neg Hx    Breast cancer Neg Hx    Colon cancer Neg Hx    Prostate cancer Neg Hx    Pancreatic cancer Neg Hx       Controlled substance contract: n/a      Review of Systems  Constitutional:  Negative for diaphoresis.  Eyes:  Negative for pain.  Respiratory:  Negative for shortness of breath.   Cardiovascular:  Negative for chest pain, palpitations and leg swelling.  Gastrointestinal:  Negative for abdominal pain.  Endocrine: Negative for polydipsia.  Skin:  Negative for rash.  Neurological:  Negative for dizziness, weakness and headaches.  Hematological:  Does  not bruise/bleed easily.  All other systems reviewed and are negative.      Objective:   Physical Exam Vitals and nursing note reviewed.  Constitutional:      Appearance: Normal appearance. He is well-developed.  HENT:     Head: Normocephalic.     Nose: Nose normal.     Mouth/Throat:     Mouth: Mucous membranes are moist.     Pharynx: Oropharynx is clear.  Eyes:     Pupils: Pupils are equal, round, and reactive to light.  Neck:     Thyroid: No thyroid mass or thyromegaly.     Vascular: No carotid bruit or JVD.  Trachea: Phonation normal.  Cardiovascular:     Rate and Rhythm: Normal rate and regular rhythm.  Pulmonary:     Effort: Pulmonary effort is normal. No respiratory distress.     Breath sounds: Normal breath sounds.  Abdominal:     General: Bowel sounds are normal.     Palpations: Abdomen is soft.     Tenderness: There is no abdominal tenderness.  Musculoskeletal:        General: Normal range of motion.     Cervical back: Normal range of motion and neck supple.  Lymphadenopathy:     Cervical: No cervical adenopathy.  Skin:    General: Skin is warm and dry.  Neurological:     Mental Status: He is alert and oriented to person, place, and time.  Psychiatric:        Behavior: Behavior normal.        Thought Content: Thought content normal.        Judgment: Judgment normal.    BP 117/67   Pulse 79   Temp 97.8 F (36.6 C) (Temporal)   Resp 20   Ht '5\' 11"'$  (1.803 m)   Wt 218 lb (98.9 kg)   SpO2 95%   BMI 30.40 kg/m   HGBA1c 5.9%       Assessment & Plan:   Jacob Rios comes in today with chief complaint of Medical Management of Chronic Issues   Diagnosis and orders addressed:  1. Essential hypertension Low sodium diet - CBC with Differential/Platelet - CMP14+EGFR - lisinopril (ZESTRIL) 2.5 MG tablet; Take 1 tablet (2.5 mg total) by mouth daily.  Dispense: 90 tablet; Refill: 1 - metoprolol succinate (TOPROL-XL) 50 MG 24 hr tablet; TAKE 1  TABLET BY MOUTH ONCE DAILY WITH OR IMMEDIATELY FOLLOWING A MEAL  Dispense: 90 tablet; Refill: 1  2. Mixed hyperlipidemia Low fat diet - Lipid panel - atorvastatin (LIPITOR) 40 MG tablet; Take 1 tablet (40 mg total) by mouth daily.  Dispense: 90 tablet; Refill: 1  3. Gastroesophageal reflux disease without esophagitis Avoid spicy foods Do not eat 2 hours prior to bedtime - omeprazole (PRILOSEC) 40 MG capsule; Take 1 capsule (40 mg total) by mouth daily.  Dispense: 90 capsule; Refill: 1  4. Coronary atherosclerosis of autologous vein bypass graft without angina Keep yearly follow up with cardiology - clopidogrel (PLAVIX) 75 MG tablet; Take 1 tablet (75 mg total) by mouth daily.  Dispense: 90 tablet; Refill: 1  5. Type 2 diabetes mellitus without complication, without long-term current use of insulin (HCC) Continue to watch carbs in diet - Bayer DCA Hb A1c Waived - glipiZIDE (GLUCOTROL XL) 10 MG 24 hr tablet; Take 1 tablet (10 mg total) by mouth daily.  Dispense: 90 tablet; Refill: 1 - metFORMIN (GLUCOPHAGE) 1000 MG tablet; Take 1 tablet (1,000 mg total) by mouth 2 (two) times daily with a meal.  Dispense: 180 tablet; Refill: 1  6. CKD stage 3 due to type 2 diabetes mellitus ([place]) Labs pending Lab Results  Component Value Date   CREATININE 1.63 (H) 08/02/2021    7. Late effect of cerebrovascular accident (CVA)  8. BMI 35.0-35.9,adult Discussed diet and exercise for person with BMI >25 Will recheck weight in 3-6 months   9. History of prostate cancer Labs pending - PSA   Labs pending Health Maintenance reviewed Diet and exercise encouraged  Follow up plan: 6 months   [place], FNP

## 2022-02-03 NOTE — Patient Instructions (Signed)

## 2022-02-04 ENCOUNTER — Other Ambulatory Visit: Payer: Self-pay

## 2022-02-04 DIAGNOSIS — D649 Anemia, unspecified: Secondary | ICD-10-CM

## 2022-02-04 LAB — PSA: Prostate Specific Ag, Serum: 0.6 ng/mL (ref 0.0–4.0)

## 2022-02-05 ENCOUNTER — Telehealth: Payer: Self-pay

## 2022-02-05 NOTE — Telephone Encounter (Signed)
Made wife/Patient aware that there was minimal change in the PSA and patient should follow up as scheduled. Wife/Patient voiced  understanding

## 2022-02-05 NOTE — Telephone Encounter (Signed)
-----  Message from [name], MD sent at 02/05/2022  9:56 AM EST ----- Minimal change in the PSA.  F/u as planned.  ----- Message ----- From: Sherrilyn Rist, CMA Sent: 02/04/2022   2:53 PM EST To: [name], MD  Please review

## 2022-02-27 NOTE — Progress Notes (Signed)
Cardiology Office Note:    Date:  03/11/2022   ID:  Jacob Rios, DOB April 17, 1945, MRN [id number]  PCP:  [name], [place] Cardiologist:  Lauree Chandler, MD     Referring MD: [name], *   Chief Complaint:  Follow-up     History of Present Illness:   Jacob Rios is a 77 y.o. male with history of CAD s/p CABG 2000,  s/p MI followed by CABG 2000, last cath 2005 occluded LIMA to LAD, native LAD open, patent SVG to diagonal and patent RIMA to RCA, normal stress myoview 2014, HTN, HLD, DM and former tobacco abuse, AAA, CVA 08/2020 echo normal LVEF 50-55%, mod LVH, trivial MR, Mild AS, loop recorder placed and no AFib.  Patient last saw Dr. Angelena Form 03/2021 and doing well.   Patient comes in for f/u with his wife. Denies chest pain, dyspnea, dizziness or cardiac complaints. Works outdoors a lot with a garden, yard work.     Past Medical History:  Diagnosis Date   Cataract    CKD (chronic kidney disease), stage III ([place])    followed by pcp   Coronary artery disease cardiologist--- dr Angelena Form   08/ 1999  s/p  cath w/ PTCA and stenting to RCA;   05/ 2000 inferior wall MI , 06-06-1998 s/p cabg x3;   Last heart cath 2005,  2 patent grafts (diagnol and RCA) and occluded LIMA--LAD graft with normal LAD nonobstructive disease;  last nuclear study 11/ 2014 no evidence ishcemia, ef 40%   Full dentures    Hiatal hernia    History of acute inferior wall MI 05/1998   s/p  cabg   Hypertension    Hypertensive retinopathy    Mixed hyperlipidemia    Nocturia more than twice per night    PAD (peripheral artery disease) (HCC)    left common iliac artery stenosis per aorta ultrasound 03/ 2021 in epic and cardiology note   Prostate cancer [facility]) urologist--- dr Jeffie Pollock   first dx 07/ 2015 in active survillance until bx 11-11-2019,  Stage T2a, Gleason 3+4, PSA 13.3   S/P CABG x 3 06/06/1998   LIMA--LAD, SVG to Diagonal, RIMA to RCA   S/P  primary angioplasty with coronary stent 08/1997   stent to RCA   Type 2 diabetes mellitus ([place])    followed by pcp  (03-06-2020 checks blood sugar dialy in am,  fasting sugar-- 110-130)   Current Medications: Current Meds  Medication Sig   atorvastatin (LIPITOR) 40 MG tablet Take 1 tablet (40 mg total) by mouth daily.   Cholecalciferol (D3 ADULT PO) Take 1 tablet by mouth daily.   clopidogrel (PLAVIX) 75 MG tablet Take 1 tablet (75 mg total) by mouth daily.   ezetimibe (ZETIA) 10 MG tablet Take 1 tablet (10 mg total) by mouth daily.   fluticasone (FLONASE) 50 MCG/ACT nasal spray Place 1 spray into both nostrils 2 (two) times daily as needed for allergies or rhinitis.   glipiZIDE (GLUCOTROL XL) 10 MG 24 hr tablet Take 1 tablet (10 mg total) by mouth daily.   glucose blood (ONETOUCH VERIO) test strip Test 1X per day and as needed  Dx 250.02   Lancets (ONETOUCH ULTRASOFT) lancets Patient test 1X per day and prn  Dx 250.02   lisinopril (ZESTRIL) 2.5 MG tablet Take 1 tablet (2.5 mg total) by mouth daily.   metFORMIN (GLUCOPHAGE) 1000 MG tablet Take 1 tablet (1,000 mg total) by mouth 2 (two) times  daily with a meal.   metoprolol succinate (TOPROL-XL) 50 MG 24 hr tablet TAKE 1 TABLET BY MOUTH ONCE DAILY WITH OR IMMEDIATELY FOLLOWING A MEAL   omeprazole (PRILOSEC) 40 MG capsule Take 1 capsule (40 mg total) by mouth daily.   vitamin C (ASCORBIC ACID) 500 MG tablet Take 500 mg by mouth daily.    Allergies:   Morphine   Social History   Tobacco Use   Smoking status: Former    Packs/day: 4.00    Years: 45.00    Total pack years: 180.00    Types: Cigarettes    Quit date: 01/13/1997    Years since quitting: 25.1   Smokeless tobacco: Never   Tobacco comments:    reports he began smoking at age 40  Vaping Use   Vaping Use: Never used  Substance Use Topics   Alcohol use: No   Drug use: Never    Family Hx: The patient's family history includes Alcohol abuse in his maternal grandfather; Cancer  (age of onset: 61) in his maternal grandfather; Congestive Heart Failure in his father; Dementia in his father; Hyperlipidemia in his father; Lung cancer in his mother; Post-traumatic stress disorder in his son. There is no history of Heart attack, Stroke, Breast cancer, Colon cancer, Prostate cancer, or Pancreatic cancer.  ROS     Physical Exam:    VS:  BP 120/70   Pulse 62   Ht 6' (1.829 m)   Wt 221 lb (100.2 kg)   SpO2 98%   BMI 29.97 kg/m     Wt Readings from Last 3 Encounters:  03/11/22 221 lb (100.2 kg)  02/03/22 218 lb (98.9 kg)  01/30/22 221 lb (100.2 kg)    Physical Exam  GEN: Well nourished, well developed, in no acute distress  Neck: bilateral carotid dopplers no JVD, or masses Cardiac:RRR; no murmurs, rubs, or gallops  Respiratory:  clear to auscultation bilaterally, normal work of breathing GI: soft, nontender, nondistended, + BS Ext: without cyanosis, clubbing, or edema, Good distal pulses bilaterally Neuro:  Alert and Oriented x 3,  Psych: euthymic mood, full affect        EKGs/Labs/Other Test Reviewed:    EKG:  EKG is   ordered today.  The ekg ordered today demonstrates NSR, normal  Recent Labs: 02/03/2022: ALT 15; BUN 15; Creatinine, Ser 1.30; Hemoglobin 11.6; Platelets 306; Potassium 4.6; Sodium 138   Recent Lipid Panel Recent Labs    02/03/22 0800  CHOL 110  TRIG 121  HDL 27*  LDLCALC 61     Prior CV Studies:   Echo 08/28/20:  1. Left ventricular ejection fraction, by estimation, is 50 to 55%. The  left ventricle has low normal function. The left ventricle has no regional  wall motion abnormalities. There is moderate left ventricular hypertrophy.  Left ventricular diastolic  parameters were normal.   2. Right ventricular systolic function is normal. The right ventricular  size is normal.   3. Left atrial size was mildly dilated.   4. The mitral valve is normal in structure. Trivial mitral valve  regurgitation. No evidence of mitral  stenosis.   5. Very mild AS with AVA 1.87 and mean gradient only 6 peak 11 mmhg no  obvious sub aortic web . The aortic valve is tricuspid. There is moderate  calcification of the aortic valve. There is moderate thickening of the  aortic valve. Aortic valve  regurgitation is not visualized. Mild aortic valve stenosis.   6. Aortic dilatation noted. There  is mild dilatation of the aortic root,  measuring 39 mm.   7. The inferior vena cava is normal in size with greater than 50%  respiratory variability, suggesting right atrial pressure of 3 mmHg.      Risk Assessment/Calculations/Metrics:              ASSESSMENT & PLAN:   No problem-specific Assessment & Plan notes found for this encounter.   CAD without angina: He has no chest pain. LV function normal by echo in 2022. Continue Plavix, beta blocker and statin.     HTN: BP is controlled. No changes    Hyperlipidemia: goal LDL less than 55: LDL 61 in January 2024. Continue statin and add Zetia 10 mg once daily. F/u FLP in 3 months.    History of CVA: Loop recorder in place-no Afib. Continue Plavix.     PAD: No claudication.Marland Kitchen He is due for f/u with Dr. Fletcher Anon.   Carotid bruits-check dopplers  DM2 per PCP A1C 5.9   AAA 2.8 2022. Scheduled for repeat           Dispo:  Return in about 1 year (around 03/12/2023) for Routine follow up in 1 year with Dr Angelena Form.   Medication Adjustments/Labs and Tests Ordered: Current medicines are reviewed at length with the patient today.  Concerns regarding medicines are outlined above.  Tests Ordered: Orders Placed This Encounter  Procedures   Lipid Profile   EKG 12-Lead   VAS US CAROTID   Medication Changes: Meds ordered this encounter  Medications   ezetimibe (ZETIA) 10 MG tablet    Sig: Take 1 tablet (10 mg total) by mouth daily.    Dispense:  90 tablet    Refill:  3   Signed, Ermalinda Barrios, PA-C  03/11/2022 10:04 AM    [address], [place], [place]   [address] Phone: [phone]; Fax: [phone]

## 2022-03-10 ENCOUNTER — Ambulatory Visit: Payer: Medicare PPO

## 2022-03-10 DIAGNOSIS — I639 Cerebral infarction, unspecified: Secondary | ICD-10-CM

## 2022-03-11 ENCOUNTER — Encounter: Payer: Self-pay | Admitting: Physician Assistant

## 2022-03-11 ENCOUNTER — Telehealth: Payer: Self-pay | Admitting: Nurse Practitioner

## 2022-03-11 ENCOUNTER — Ambulatory Visit: Payer: Medicare PPO | Attending: Physician Assistant | Admitting: Physician Assistant

## 2022-03-11 ENCOUNTER — Other Ambulatory Visit: Payer: Self-pay | Admitting: *Deleted

## 2022-03-11 ENCOUNTER — Other Ambulatory Visit: Payer: Self-pay | Admitting: Physician Assistant

## 2022-03-11 VITALS — BP 120/70 | HR 62 | Ht 72.0 in | Wt 221.0 lb

## 2022-03-11 DIAGNOSIS — I2510 Atherosclerotic heart disease of native coronary artery without angina pectoris: Secondary | ICD-10-CM | POA: Diagnosis not present

## 2022-03-11 DIAGNOSIS — I739 Peripheral vascular disease, unspecified: Secondary | ICD-10-CM | POA: Diagnosis not present

## 2022-03-11 DIAGNOSIS — R0989 Other specified symptoms and signs involving the circulatory and respiratory systems: Secondary | ICD-10-CM | POA: Diagnosis not present

## 2022-03-11 DIAGNOSIS — I714 Abdominal aortic aneurysm, without rupture, unspecified: Secondary | ICD-10-CM

## 2022-03-11 DIAGNOSIS — E0800 Diabetes mellitus due to underlying condition with hyperosmolarity without nonketotic hyperglycemic-hyperosmolar coma (NKHHC): Secondary | ICD-10-CM | POA: Diagnosis not present

## 2022-03-11 DIAGNOSIS — I10 Essential (primary) hypertension: Secondary | ICD-10-CM

## 2022-03-11 DIAGNOSIS — I639 Cerebral infarction, unspecified: Secondary | ICD-10-CM

## 2022-03-11 DIAGNOSIS — E782 Mixed hyperlipidemia: Secondary | ICD-10-CM | POA: Diagnosis not present

## 2022-03-11 LAB — CUP PACEART REMOTE DEVICE CHECK
Date Time Interrogation Session: 20240225231330
Implantable Pulse Generator Implant Date: 20220818

## 2022-03-11 MED ORDER — EZETIMIBE 10 MG PO TABS: 10.0000 mg | ORAL_TABLET | Freq: Every day | ORAL | 3 refills | Status: DC

## 2022-03-11 NOTE — Patient Instructions (Signed)
Medication Instructions:   START Zetia one (1) tablet by mouth ( 10 mg) daily.   *If you need a refill on your cardiac medications before your next appointment, please call your pharmacy*   Lab Work:   Your physician recommends that you return for a FASTING lipid profile in 3 months in [place].  Don't forget your paperwork. Fasting from midnight the night before.   If you have labs (blood work) drawn today and your tests are completely normal, you will receive your results only by: [place] (if you have MyChart) OR A paper copy in the mail If you have any lab test that is abnormal or we need to change your treatment, we will call you to review the results.   Testing/Procedures:  Your physician has requested that you have a carotid duplex. This test is an ultrasound of the carotid arteries in your neck. It looks at blood flow through these arteries that supply the brain with blood. Allow one hour for this exam. There are no restrictions or special instructions.    Follow-Up: At [facility], you and your health needs are our priority.  As part of our continuing mission to provide you with exceptional heart care, we have created designated Provider Care Teams.  These Care Teams include your primary Cardiologist (physician) and Advanced Practice Providers (APPs -  Physician Assistants and Nurse Practitioners) who all work together to provide you with the care you need, when you need it.  We recommend signing up for the patient portal called "MyChart".  Sign up information is provided on this After Visit Summary.  MyChart is used to connect with patients for Virtual Visits (Telemedicine).  Patients are able to view lab/test results, encounter notes, upcoming appointments, etc.  Non-urgent messages can be sent to your provider as well.   To learn more about what you can do with MyChart, go to [email or web address].    Your next appointment:   1 year(s)  Provider:    Lauree Chandler, MD     Other Instructions  Your physician wants you to follow-up in: 1 year with Dr. Angelena Form.  You will receive a reminder letter in the mail two months in advance. If you don't receive a letter, please call our office to schedule the follow-up appointment.

## 2022-03-11 NOTE — Telephone Encounter (Signed)
Patient called requesting to speak with nurse regarding his cholesterol results from his heart doctor and what they are telling him.

## 2022-03-11 NOTE — Telephone Encounter (Signed)
Pt states that his heart Dr changed his cholesterol medicine today because she wanted his LDL below 55. Wanted to know the range of LDL. Patient notified and verbalized understanding

## 2022-03-12 NOTE — Telephone Encounter (Signed)
Patient returned missed call. Says his LDL level was 61.

## 2022-03-12 NOTE — Telephone Encounter (Signed)
Lmtcb ? ?Need more information  ?

## 2022-03-14 NOTE — Telephone Encounter (Signed)
Spoke with patient about the zetia that cardiology added to his meds and advised that it was ok to take it

## 2022-03-21 NOTE — Progress Notes (Signed)
[facility] Summary Report / Loop Recorder 

## 2022-03-26 ENCOUNTER — Ambulatory Visit: Payer: Medicare PPO | Attending: Physician Assistant

## 2022-03-26 DIAGNOSIS — R0989 Other specified symptoms and signs involving the circulatory and respiratory systems: Secondary | ICD-10-CM | POA: Diagnosis not present

## 2022-04-14 ENCOUNTER — Ambulatory Visit (INDEPENDENT_AMBULATORY_CARE_PROVIDER_SITE_OTHER): Payer: Medicare PPO

## 2022-04-14 DIAGNOSIS — I639 Cerebral infarction, unspecified: Secondary | ICD-10-CM

## 2022-04-15 LAB — CUP PACEART REMOTE DEVICE CHECK
Date Time Interrogation Session: 20240331231618
Implantable Pulse Generator Implant Date: 20220818

## 2022-04-17 ENCOUNTER — Other Ambulatory Visit: Payer: Self-pay | Admitting: Family Medicine

## 2022-04-17 DIAGNOSIS — H60331 Swimmer's ear, right ear: Secondary | ICD-10-CM

## 2022-04-18 NOTE — Progress Notes (Signed)
[facility] Summary Report / Loop Recorder 

## 2022-04-19 ENCOUNTER — Other Ambulatory Visit: Payer: Self-pay | Admitting: Family Medicine

## 2022-04-19 DIAGNOSIS — H60331 Swimmer's ear, right ear: Secondary | ICD-10-CM

## 2022-04-24 ENCOUNTER — Ambulatory Visit: Payer: Medicare PPO | Attending: Physician Assistant

## 2022-04-24 DIAGNOSIS — I7143 Infrarenal abdominal aortic aneurysm, without rupture: Secondary | ICD-10-CM | POA: Diagnosis not present

## 2022-04-24 DIAGNOSIS — I714 Abdominal aortic aneurysm, without rupture, unspecified: Secondary | ICD-10-CM

## 2022-05-01 ENCOUNTER — Other Ambulatory Visit: Payer: Self-pay | Admitting: Family Medicine

## 2022-05-01 DIAGNOSIS — H60331 Swimmer's ear, right ear: Secondary | ICD-10-CM

## 2022-05-19 ENCOUNTER — Ambulatory Visit (INDEPENDENT_AMBULATORY_CARE_PROVIDER_SITE_OTHER): Payer: Medicare PPO

## 2022-05-19 DIAGNOSIS — I639 Cerebral infarction, unspecified: Secondary | ICD-10-CM

## 2022-05-19 LAB — CUP PACEART REMOTE DEVICE CHECK
Date Time Interrogation Session: 20240503230513
Implantable Pulse Generator Implant Date: 20220818

## 2022-05-20 NOTE — Progress Notes (Signed)
[facility] Summary Report / Loop Recorder 

## 2022-06-02 ENCOUNTER — Other Ambulatory Visit: Payer: Self-pay | Admitting: Physician Assistant

## 2022-06-02 DIAGNOSIS — I714 Abdominal aortic aneurysm, without rupture, unspecified: Secondary | ICD-10-CM

## 2022-06-16 NOTE — Progress Notes (Signed)
[facility] Summary Report / Loop Recorder 

## 2022-06-23 ENCOUNTER — Ambulatory Visit (INDEPENDENT_AMBULATORY_CARE_PROVIDER_SITE_OTHER): Payer: Medicare PPO

## 2022-06-23 DIAGNOSIS — I639 Cerebral infarction, unspecified: Secondary | ICD-10-CM

## 2022-06-23 LAB — CUP PACEART REMOTE DEVICE CHECK
Date Time Interrogation Session: 20240609230451
Implantable Pulse Generator Implant Date: 20220818

## 2022-06-25 ENCOUNTER — Encounter: Payer: Self-pay | Admitting: Family Medicine

## 2022-06-25 ENCOUNTER — Ambulatory Visit: Payer: Medicare PPO | Admitting: Family Medicine

## 2022-06-25 VITALS — BP 144/74 | HR 62 | Temp 97.7°F | Ht 73.0 in | Wt 219.6 lb

## 2022-06-25 DIAGNOSIS — T50905A Adverse effect of unspecified drugs, medicaments and biological substances, initial encounter: Secondary | ICD-10-CM | POA: Diagnosis not present

## 2022-06-25 DIAGNOSIS — R42 Dizziness and giddiness: Secondary | ICD-10-CM

## 2022-06-25 NOTE — Progress Notes (Signed)
Subjective:  Patient ID: Jacob Rios, male    DOB: [birth year], 77 y.o.   MRN: [id number]  Patient Care Team: Bennie Pierini, FNP as PCP - General (Family Medicine) Kathleene Hazel, MD as PCP - Cardiology (Cardiology) Bjorn Pippin, MD as Attending Physician (Urology) Michaelle Copas, MD as Referring Physician (Optometry) Felicita Gage, RN as Oncology Nurse Navigator   Chief Complaint:  Dizziness (X 4 months after getting started on zetia- was added by cardiologist.  ) and Ear Pain (Bilateral.  Also has been having nasal congestion x 4 months. )   HPI: Jacob Rios is a 77 y.o. male presenting on 06/25/2022 for Dizziness (X 4 months after getting started on zetia- was added by cardiologist.  ) and Ear Pain (Bilateral.  Also has been having nasal congestion x 4 months. 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 The current episode started more than 1 month ago. The problem occurs constantly (wasxes and wanes but is "genrerally always there"). The problem has been unchanged. Associated symptoms include a sore throat (varialble sisde to side on different days doesnt correlate dwith ear pain). Pertinent negatives include no chest pain, chills, congestion, diaphoresis, fatigue, fever, headaches, numbness or weakness. The symptoms are aggravated by exertion. He has tried nothing for the symptoms. The treatment provided no relief.   There  are no diagnoses linked to this encounter.   Ear Pain  Relevant past medical, surgical, family, and social history reviewed and updated as indicated.  Allergies and medications reviewed and updated. Data reviewed: Chart in Epic.   Past Medical History:  Diagnosis Date   Cataract    CKD (chronic kidney disease), stage III (HCC)    followed by pcp   Coronary artery disease cardiologist--- dr Clifton James   08/ 1999  s/p  cath w/ PTCA and stenting to RCA;   05/ 2000 inferior wall MI , 06-06-1998 s/p cabg x3;   Last heart cath 2005,  2 patent grafts (diagnol and RCA) and occluded LIMA--LAD graft with normal LAD nonobstructive disease;  last nuclear study 11/ 2014 no evidence ishcemia, ef 40%   Full dentures    Hiatal hernia    History of acute inferior wall MI 05/1998   s/p  cabg   Hypertension    Hypertensive retinopathy    Mixed hyperlipidemia    Nocturia more than twice per night    PAD (peripheral artery disease) (HCC)    left common iliac artery stenosis per aorta ultrasound 03/ 2021 in epic and cardiology note   Prostate cancer [facility]) urologist--- dr Annabell Howells   first dx 07/ 2015 in active survillance until bx 11-11-2019,  Stage T2a, Gleason 3+4, PSA 13.3   S/P CABG x 3 06/06/1998   LIMA--LAD, SVG to Diagonal, RIMA to RCA   S/P primary angioplasty with coronary stent 08/1997   stent to RCA   Type 2 diabetes mellitus (HCC)    followed by pcp  (03-06-2020 checks  blood sugar dialy in am,  fasting sugar-- 110-130)    Past Surgical History:  Procedure Laterality Date   APPENDECTOMY  child   CARDIAC CATHETERIZATION  09/01/2000  @[facility]    patent RCA and Diagonal grafts, atretic LIMA, moderate nonobstructive proxLAD, normal lvsf   CARDIAC CATHETERIZATION  04-21-2003  @[facility]    LIMA--LAD graft occluded, native LAD with nonobstructive disease,  patent diagonal/ rca grafts   CATARACT EXTRACTION     CATARACT EXTRACTION W/ INTRAOCULAR LENS IMPLANT Right 12/2018   CORONARY ANGIOPLASTY WITH STENT  PLACEMENT  08/1997  @[facility]    ptca w/ stenting to rca   CORONARY ARTERY BYPASS GRAFT  06-06-1998  @[facility]     LIMA -- LAD, SVG -- Diagonal,  RIMA to RCA   CYSTOSCOPY N/A 03/09/2020   Procedure: CYSTOSCOPY FLEXIBLE;  Surgeon: Bjorn Pippin, MD;  Location: [facility];  Service: Urology;  Laterality: N/A;  NO SEEDS FOUND IN BLADDER   EYE SURGERY     INGUINAL HERNIA REPAIR Right 1974   LOOP RECORDER INSERTION N/A 08/30/2020   Procedure: LOOP RECORDER INSERTION;  Surgeon: Lanier Prude, MD;  Location: [facility];  Service: Cardiovascular;  Laterality: N/A;   PROSTATE BIOPSY  06/2017   x 4-5    RADIOACTIVE SEED IMPLANT N/A 03/09/2020   Procedure: RADIOACTIVE SEED IMPLANT/BRACHYTHERAPY IMPLANT;  Surgeon: Bjorn Pippin, MD;  Location: [facility];  Service: Urology;  Laterality: N/A;   70  SEEDS IMPLANTED   SPACE OAR INSTILLATION N/A 03/09/2020   Procedure: SPACE OAR INSTILLATION;  Surgeon: Bjorn Pippin, MD;  Location: [facility];  Service: Urology;  Laterality: N/A;   VENTRAL HERNIA REPAIR  09/ 2000 and recurrent repair 06/ 2001    Social History   Socioeconomic History   Marital status: Married    Spouse name: Bonita Quin    Number of children: 3   Years of education: Not on file   Highest education level: Not on file  Occupational History   Occupation: part-time trucker    Comment: retired   Tobacco Use   Smoking status: Former    Packs/day: 4.00    Years: 45.00    Additional pack years: 0.00    Total pack years: 180.00    Types: Cigarettes    Quit date: 01/13/1997    Years since quitting: 25.4   Smokeless tobacco: Never   Tobacco comments:    reports he began smoking at age 27  Vaping Use   Vaping Use: Never used  Substance and Sexual Activity   Alcohol use: No   Drug use: Never   Sexual activity: Not on file  Other Topics Concern   Not on file  Social History Narrative   Lives home with wife   Social Determinants of Health    Financial Resource Strain: Low Risk  (06/21/2020)   Overall Financial Resource Strain (CARDIA)    Difficulty of Paying Living Expenses: Not very hard  Food Insecurity: No Food Insecurity (06/21/2020)   Hunger Vital Sign    Worried About Running Out of Food in the Last Year: Never true    Ran Out of Food in the Last Year: Never true  Transportation Needs: No Transportation Needs (06/21/2020)   PRAPARE - Administrator, Civil Service (Medical): No    Lack of Transportation (Non-Medical): No  Physical Activity: Sufficiently Active (06/21/2020)   Exercise Vital Sign    Days of Exercise per Week: 7 days    Minutes of Exercise  per Session: 30 min  Stress: No Stress Concern Present (06/21/2020)   [employer] of Occupational Health - Occupational Stress Questionnaire    Feeling of Stress : Only a little  Social Connections: Socially Integrated (06/21/2020)   Social Connection and Isolation Panel [NHANES]    Frequency of Communication with Friends and Family: More than three times a week    Frequency of Social Gatherings with Friends and Family: More than three times a week    Attends Religious Services: More than 4 times per year    Active Member of [employer] or Organizations: Yes    Attends Engineer, structural: More than 4 times per year    Marital Status: Married  Catering manager Violence: Not At Risk (06/21/2020)   Humiliation, Afraid, Rape, and Kick questionnaire    Fear of Current or Ex-Partner: No    Emotionally Abused: No    Physically Abused: No    Sexually Abused: No    Outpatient Encounter Medications as of 06/25/2022  Medication Sig   atorvastatin (LIPITOR) 40 MG tablet Take 1 tablet (40 mg total) by mouth daily.   Cholecalciferol (D3 ADULT PO) Take 1 tablet by mouth daily.   clopidogrel (PLAVIX) 75 MG tablet Take 1 tablet (75 mg total) by mouth daily.   glipiZIDE (GLUCOTROL XL) 10 MG 24 hr tablet Take 1 tablet (10 mg total) by mouth daily.   glucose blood  (ONETOUCH VERIO) test strip Test 1X per day and as needed  Dx 250.02   Lancets (ONETOUCH ULTRASOFT) lancets Patient test 1X per day and prn  Dx 250.02   lisinopril (ZESTRIL) 2.5 MG tablet Take 1 tablet (2.5 mg total) by mouth daily.   metFORMIN (GLUCOPHAGE) 1000 MG tablet Take 1 tablet (1,000 mg total) by mouth 2 (two) times daily with a meal.   metoprolol succinate (TOPROL-XL) 50 MG 24 hr tablet TAKE 1 TABLET BY MOUTH ONCE DAILY WITH OR IMMEDIATELY FOLLOWING A MEAL   omeprazole (PRILOSEC) 40 MG capsule Take 1 capsule (40 mg total) by mouth daily.   vitamin C (ASCORBIC ACID) 500 MG tablet Take 500 mg by mouth daily.   [DISCONTINUED] ezetimibe (ZETIA) 10 MG tablet Take 1 tablet (10 mg total) by mouth daily.   fluticasone (FLONASE) 50 MCG/ACT nasal spray Place 1 spray into both nostrils 2 (two) times daily as needed for allergies or rhinitis. (Patient not taking: Reported on 06/25/2022)   No facility-administered encounter medications on file as of 06/25/2022.    Allergies  Allergen Reactions   Morphine Shortness Of Breath and Swelling    Review of Systems  Constitutional: Negative.  Negative for activity change, appetite change, chills, diaphoresis, fatigue, fever and unexpected weight change.  HENT:  Positive for ear pain (chronic outer ear infections), hearing loss, sinus pressure and sore throat (varialble sisde to side on different days doesnt correlate dwith ear pain). Negative for congestion, ear discharge, tinnitus and trouble swallowing.   Eyes:  Negative for photophobia, pain, discharge, redness, itching and visual disturbance.  Respiratory:  Negative for choking, chest tightness, shortness of breath and wheezing.   Cardiovascular:  Negative for chest pain and palpitations.  Genitourinary: Negative.  Negative for decreased urine volume and difficulty urinating.  Musculoskeletal: Negative.   Skin: Negative.   Neurological:  Positive for dizziness and light-headedness. Negative for  tremors, seizures, syncope, facial asymmetry, speech difficulty, weakness, numbness and headaches.  Psychiatric/Behavioral:  Negative for confusion. The patient is nervous/anxious.   All other systems reviewed and are negative.  Objective:  BP (!) 144/74   Pulse 62   Temp 97.7 F (36.5 C) (Temporal)   Ht 6\' 1"  (1.854 m)   Wt 219 lb 9.6 oz (99.6 kg)   SpO2 97%   BMI 28.97 kg/m    Wt Readings from Last 3 Encounters:  06/25/22 219 lb 9.6 oz (99.6 kg)  03/11/22 221 lb (100.2 kg)  02/03/22 218 lb (98.9 kg)    Physical Exam Vitals and nursing note reviewed.  Constitutional:      General: He is not in acute distress.    Appearance: Normal appearance. He is well-developed and well-groomed. He is not ill-appearing, toxic-appearing or diaphoretic.  HENT:     Head: Normocephalic and atraumatic.     Jaw: There is normal jaw occlusion.     Right Ear: Hearing, tympanic membrane, ear canal and external ear normal.     Left Ear: Hearing, tympanic membrane, ear canal and external ear normal.     Nose: Nose normal.     Mouth/Throat:     Lips: Pink.     Mouth: Mucous membranes are moist.     Pharynx: Oropharynx is clear. Uvula midline.  Eyes:     General: Lids are normal.     Extraocular Movements: Extraocular movements intact.     Conjunctiva/sclera: Conjunctivae normal.     Pupils: Pupils are equal, round, and reactive to light.  Neck:     Thyroid: No thyroid mass, thyromegaly or thyroid tenderness.     Vascular: No carotid bruit or JVD.     Trachea: Trachea and phonation normal.  Cardiovascular:     Rate and Rhythm: Normal rate and regular rhythm.     Chest Wall: PMI is not displaced.     Pulses: Normal pulses.     Heart sounds: Normal heart sounds. No murmur heard.    No friction rub. No gallop.  Pulmonary:     Effort: Pulmonary effort is normal. No respiratory distress.     Breath sounds: Normal breath sounds. No wheezing.  Abdominal:     General: Bowel sounds are  normal. There is no distension or abdominal bruit.     Palpations: Abdomen is soft. There is no hepatomegaly or splenomegaly.     Tenderness: There is no abdominal tenderness. There is no right CVA tenderness or left CVA tenderness.     Hernia: No hernia is present.  Genitourinary:    Penis: Normal.      Prostate: Normal.     Rectum: Normal.  Musculoskeletal:        General: Normal range of motion.     Cervical back: Normal range of motion and neck supple.     Right lower leg: No edema.     Left lower leg: No edema.  Lymphadenopathy:     Cervical: No cervical adenopathy.  Skin:    General: Skin is warm and dry.     Capillary Refill: Capillary refill takes less than 2 seconds.     Coloration: Skin is not cyanotic, jaundiced or pale.     Findings: No rash.  Neurological:     General: No focal deficit present.     Mental Status: He is alert and oriented to person, place, and time.     GCS: GCS eye subscore is 4. GCS verbal subscore is 5. GCS motor subscore is 6.     Cranial Nerves: Cranial nerves 2-12 are intact. No cranial nerve deficit.     Sensory: Sensation is intact. No sensory  deficit.     Motor: Motor function is intact. No weakness.     Coordination: Coordination is intact. Coordination normal.     Gait: Gait is intact. Gait normal.     Deep Tendon Reflexes: Reflexes are normal and symmetric. Reflexes normal.  Psychiatric:        Attention and Perception: Attention and perception normal.        Mood and Affect: Mood and affect normal.        Speech: Speech normal.        Behavior: Behavior normal. Behavior is cooperative.        Thought Content: Thought content normal.        Cognition and Memory: Cognition and memory normal.        Judgment: Judgment normal.     Results for orders placed or performed in visit on 06/23/22  CUP PACEART REMOTE DEVICE CHECK  Result Value Ref Range   Date Time Interrogation Session [id number]    Pulse Generator Manufacturer MERM     Pulse Gen Model LNQ22 LINQ II    Pulse Gen Serial Number [id number]    Clinic Name [facility]    Implantable Pulse Generator Type ICM/ILR    Implantable Pulse Generator Implant Date [id number]        Pertinent labs & imaging results that were available during my care of the patient were reviewed by me and considered in my medical decision making.  Assessment & Plan:  Zed was seen today for dizziness and ear pain.  Diagnoses and all orders for this visit:  Drug-induced dizziness: No neuro or cardiovascular findings on physical exam. CN 2-12 intact. Arm/leg strength equal bilat. No gait abnormalities observed.  History correlates of start of dizziness and start of Zetia.   STOP Zetia 10mg  PO  Make appointment with cardiologist  Followup with PCP in 2-3 weeks.   Continue all other maintenance medications.  Follow up plan: Return if symptoms worsen or fail to improve.  After stopping Zetia 10mg  return to clinic if S&S worsen or does not resolve in a week.  Make appointment with Cardiologist regarding dizziness and stopping Zetia.   Make appointemnt with PCP for 2-3weeks for follpowup on dizziness   Continue healthy lifestyle choices, including diet (rich in fruits, vegetables, and lean proteins, and low in salt and simple carbohydrates) and exercise (at least 30 minutes of moderate physical activity daily).  Educational handout given for DASH diet  The above assessment and management plan was discussed with the patient. The patient verbalized understanding of and has agreed to the management plan. Patient is aware to call the clinic if they develop any new symptoms or if symptoms persist or worsen. Patient is aware when to return to the clinic for a follow-up visit. Patient educated on when it is appropriate to go to the emergency department.   Lennox Grumbles, NP Student    I personally was present during the history, physical exam, and medical decision-making activities of  this visit and have verified that the services and findings are accurately documented in the nurse practitioner student's note.  Kari Baars, FNP-C [facility] [address] [place], [place] [address] [phone]

## 2022-06-25 NOTE — Patient Instructions (Signed)

## 2022-06-26 ENCOUNTER — Telehealth: Payer: Self-pay | Admitting: Cardiovascular Disease

## 2022-06-26 NOTE — Telephone Encounter (Signed)
Pt c/o medication issue:  1. Name of Medication:   ezetimibe (ZETIA) 10 MG tablet   2. How are you currently taking this medication (dosage and times per day)?   3. Are you having a reaction (difficulty breathing--STAT)?  Dizzy  4. What is your medication issue?   Wife states this medication has been making the patient dizzy and wants to get alternate medication.  Wife noted patient has stopped taking this medication.  Wife stated best time to reach them is after 11:00 am today.

## 2022-06-26 NOTE — Telephone Encounter (Signed)
Returned call to patient's spouse Bonita Quin ([facility] per [facility]).  Bonita Quin states patient has been experiencing "a lot of dizziness" and relates it to Zetia. Patient saw PCP yesterday and Zetia was stopped. PCP informed patient that it could take a couple weeks to get medication out of his system, plan to follow-up in 2-3 weeks.  Zetia removed from medication list by PCP office.  Patient unable to report any improvement yet as today will be the first day without the medication. Informed Bonita Quin to have patient keep [place] updated on his symptoms if things improve/worsen or remain the same.  Will forward to Dr. Gibson Ramp nurse to follow-up.

## 2022-07-14 ENCOUNTER — Telehealth: Payer: Self-pay | Admitting: Nurse Practitioner

## 2022-07-14 DIAGNOSIS — E782 Mixed hyperlipidemia: Secondary | ICD-10-CM

## 2022-07-14 DIAGNOSIS — I10 Essential (primary) hypertension: Secondary | ICD-10-CM

## 2022-07-14 DIAGNOSIS — E119 Type 2 diabetes mellitus without complications: Secondary | ICD-10-CM

## 2022-07-14 NOTE — Telephone Encounter (Signed)
Orders placed for labs

## 2022-07-15 NOTE — Progress Notes (Signed)
[facility] Summary Report / Loop Recorder 

## 2022-07-18 ENCOUNTER — Encounter: Payer: Self-pay | Admitting: Nurse Practitioner

## 2022-07-18 ENCOUNTER — Ambulatory Visit: Payer: Medicare PPO | Admitting: Nurse Practitioner

## 2022-07-18 VITALS — BP 110/66 | HR 73 | Temp 98.1°F | Resp 20 | Ht 73.0 in | Wt 218.0 lb

## 2022-07-18 DIAGNOSIS — Z6835 Body mass index (BMI) 35.0-35.9, adult: Secondary | ICD-10-CM | POA: Diagnosis not present

## 2022-07-18 DIAGNOSIS — N183 Chronic kidney disease, stage 3 unspecified: Secondary | ICD-10-CM

## 2022-07-18 DIAGNOSIS — Z7984 Long term (current) use of oral hypoglycemic drugs: Secondary | ICD-10-CM

## 2022-07-18 DIAGNOSIS — I10 Essential (primary) hypertension: Secondary | ICD-10-CM | POA: Diagnosis not present

## 2022-07-18 DIAGNOSIS — K219 Gastro-esophageal reflux disease without esophagitis: Secondary | ICD-10-CM

## 2022-07-18 DIAGNOSIS — E782 Mixed hyperlipidemia: Secondary | ICD-10-CM

## 2022-07-18 DIAGNOSIS — E119 Type 2 diabetes mellitus without complications: Secondary | ICD-10-CM

## 2022-07-18 DIAGNOSIS — E1122 Type 2 diabetes mellitus with diabetic chronic kidney disease: Secondary | ICD-10-CM

## 2022-07-18 DIAGNOSIS — I6930 Unspecified sequelae of cerebral infarction: Secondary | ICD-10-CM | POA: Diagnosis not present

## 2022-07-18 DIAGNOSIS — I25810 Atherosclerosis of coronary artery bypass graft(s) without angina pectoris: Secondary | ICD-10-CM

## 2022-07-18 LAB — CMP14+EGFR
ALT: 11 IU/L (ref 0–44)
AST: 9 IU/L (ref 0–40)
BUN/Creatinine Ratio: 14 (ref 10–24)
BUN: 25 mg/dL (ref 8–27)
CO2: 23 mmol/L (ref 20–29)
Calcium: 9.3 mg/dL (ref 8.6–10.2)
Creatinine, Ser: 1.81 mg/dL — ABNORMAL HIGH (ref 0.76–1.27)
Glucose: 89 mg/dL (ref 70–99)
Potassium: 5.1 mmol/L (ref 3.5–5.2)
Total Protein: 7 g/dL (ref 6.0–8.5)
eGFR: 39 mL/min/{1.73_m2} — ABNORMAL LOW (ref >59)

## 2022-07-18 LAB — CBC WITH DIFFERENTIAL/PLATELET
Basophils Absolute: 0 10*3/uL (ref 0.0–0.2)
Hematocrit: 37.4 % — ABNORMAL LOW (ref 37.5–51.0)
Immature Granulocytes: 0 %
Lymphs: 17 %
MCHC: 32.6 g/dL (ref 31.5–35.7)
MCV: 82 fL (ref 79–97)
RBC: 4.57 x10E6/uL (ref 4.14–5.80)
WBC: 6.2 10*3/uL (ref 3.4–10.8)

## 2022-07-18 LAB — LIPID PANEL: Triglycerides: 99 mg/dL (ref 0–149)

## 2022-07-18 LAB — BAYER DCA HB A1C WAIVED: HB A1C (BAYER DCA - WAIVED): 5.6 % (ref 4.8–5.6)

## 2022-07-18 MED ORDER — ATORVASTATIN CALCIUM 40 MG PO TABS: 40.0000 mg | ORAL_TABLET | Freq: Every day | ORAL | 1 refills | Status: DC

## 2022-07-18 MED ORDER — OMEPRAZOLE 40 MG PO CPDR
40.0000 mg | DELAYED_RELEASE_CAPSULE | Freq: Every day | ORAL | 1 refills | Status: DC
Start: 2022-07-18 — End: 2023-01-20

## 2022-07-18 MED ORDER — METFORMIN HCL 1000 MG PO TABS
1000.0000 mg | ORAL_TABLET | Freq: Two times a day (BID) | ORAL | 1 refills | Status: DC
Start: 2022-07-18 — End: 2023-01-20

## 2022-07-18 MED ORDER — LISINOPRIL 2.5 MG PO TABS: 2.5000 mg | ORAL_TABLET | Freq: Every day | ORAL | 1 refills | Status: DC

## 2022-07-18 MED ORDER — CLOPIDOGREL BISULFATE 75 MG PO TABS: 75.0000 mg | ORAL_TABLET | Freq: Every day | ORAL | 1 refills | Status: DC

## 2022-07-18 MED ORDER — GLIPIZIDE ER 10 MG PO TB24: 10.0000 mg | ORAL_TABLET | Freq: Every day | ORAL | 1 refills | Status: DC

## 2022-07-18 MED ORDER — METOPROLOL SUCCINATE ER 50 MG PO TB24
ORAL_TABLET | ORAL | 1 refills | Status: DC
Start: 2022-07-18 — End: 2023-01-20

## 2022-07-18 NOTE — Patient Instructions (Signed)
Vertigo Vertigo is the feeling that you or the things around you are moving when they are not. This feeling can come and go at any time. Vertigo often goes away on its own. This condition can be dangerous if it happens when you are doing activities like driving or working with machines. Your doctor will do tests to find the cause of your vertigo. These tests will also help your doctor decide on the best treatment for you. Follow these instructions at home: Eating and drinking     Drink enough fluid to keep your pee (urine) pale yellow. Do not drink alcohol. Activity Return to your normal activities when your doctor says that it is safe. In the morning, first sit up on the side of the bed. When you feel okay, stand slowly while you hold onto something until you know that your balance is fine. Move slowly. Avoid sudden body or head movements or certain positions, as told by your doctor. Use a cane if you have trouble standing or walking. Sit down right away if you feel dizzy. Avoid doing any tasks or activities that can cause danger to you or others if you get dizzy. Avoid bending down if you feel dizzy. Place items in your home so that they are easy for you to reach without bending or leaning over. Do not drive or use machinery if you feel dizzy. General instructions Take over-the-counter and prescription medicines only as told by your doctor. Keep all follow-up visits. Contact a doctor if: Your medicine does not help your vertigo. Your problems get worse or you have new symptoms. You have a fever. You feel like you may vomit (nauseous), or this feeling gets worse. You start to vomit. Your family or friends see changes in how you act. You lose feeling (have numbness) in part of your body. You feel prickling and tingling in a part of your body. Get help right away if: You are always dizzy. You faint. You get very bad headaches. You get a stiff neck. Bright light starts to bother  you. You have trouble moving or talking. You feel weak in your hands, arms, or legs. You have changes in your hearing or in how you see (vision). These symptoms may be an emergency. Get help right away. Call your local emergency services (911 in the [place] Do not wait to see if the symptoms will go away. Do not drive yourself to the hospital. Summary Vertigo is the feeling that you or the things around you are moving when they are not. Your doctor will do tests to find the cause of your vertigo. You may be told to avoid some tasks, positions, or movements. Contact a doctor if your medicine is not helping, or if you have a fever, new symptoms, or a change in how you act. Get help right away if you get very bad headaches, or if you have changes in how you speak, hear, or see. This information is not intended to replace advice given to you by your health care provider. Make sure you discuss any questions you have with your health care provider. Document Revised: 11/30/2019 Document Reviewed: 11/30/2019 Elsevier Patient Education  2024 [employer]  

## 2022-07-18 NOTE — Progress Notes (Signed)
Subjective:    Patient ID: Jacob Rios, male    DOB: December 16, 1945, 77 y.o.   MRN: [id number]   Chief Complaint: medical management of chronic issues     Chief Complaint: Medical Management of Chronic Issues    HPI:  Jacob Rios is a 77 y.o. who identifies as a male who was assigned male at birth.   Social history: Lives with: wife Work history: retired  *[name] is a regular patient of mine in which is see for chronic medical conditions. The conditions listed below will be discussed at apointment.   Comes in today for follow up of the following chronic medical issues:  1. Essential hypertension No c/o chest pain, sob or head ache. Doe snot check blood pressure at home.  BP Readings from Last 3 Encounters:  07/18/22 110/66  06/25/22 (!) 144/74  03/11/22 120/70     2. Mixed hyperlipidemia Does try to watch diet and stays very active. Lab Results  Component Value Date   CHOL 110 02/03/2022   HDL 27 (L) 02/03/2022   LDLCALC 61 02/03/2022   TRIG 121 02/03/2022   CHOLHDL 4.1 02/03/2022      3. Type 2 diabetes mellitus without complication, without long-term current use of insulin (HCC) Fasting blood sugars are running around 110-130. No  low blood sugars. Lab Results  Component Value Date   HGBA1C 5.9 (H) 02/03/2022     4. Gastroesophageal reflux disease without esophagitis Is on omperazole daily and is doing well.  5. CKD stage 3 due to type 2 diabetes mellitus (HCC) No voiding issues Lab Results  Component Value Date   CREATININE 1.30 (H) 02/03/2022     6. Late effect of cerebrovascular accident (CVA) No permanent effects  7. BMI 35.0-35.9,adult No recent weight changes Wt Readings from Last 3 Encounters:  07/18/22 218 lb (98.9 kg)  06/25/22 219 lb 9.6 oz (99.6 kg)  03/11/22 221 lb (100.2 kg)   BMI Readings from Last 3 Encounters:  07/18/22 28.76 kg/m  06/25/22 28.97 kg/m  03/11/22 29.97 kg/m       New complaints:  Patient  was seen in office on 06/25/22 with c/o dizziness. He said it seemed to have started after cardiology put him on zetia. No physical reason for dizziness. It was suggested that he stop zetia and inform cardiology. His wife called cardiology office and told them he was stopping zetia due to dizziness. There only response was to keep them informed of any other changes. Stopping the zetia stopped about 98% of dizzy spells.  Allergies  Allergen Reactions   Morphine Shortness Of Breath and Swelling   Outpatient Encounter Medications as of 07/18/2022  Medication Sig   atorvastatin (LIPITOR) 40 MG tablet Take 1 tablet (40 mg total) by mouth daily.   Cholecalciferol (D3 ADULT PO) Take 1 tablet by mouth daily.   clopidogrel (PLAVIX) 75 MG tablet Take 1 tablet (75 mg total) by mouth daily.   glipiZIDE (GLUCOTROL XL) 10 MG 24 hr tablet Take 1 tablet (10 mg total) by mouth daily.   glucose blood (ONETOUCH VERIO) test strip Test 1X per day and as needed  Dx 250.02   Lancets (ONETOUCH ULTRASOFT) lancets Patient test 1X per day and prn  Dx 250.02   lisinopril (ZESTRIL) 2.5 MG tablet Take 1 tablet (2.5 mg total) by mouth daily.   metFORMIN (GLUCOPHAGE) 1000 MG tablet Take 1 tablet (1,000 mg total) by mouth 2 (two) times daily with a meal.  metoprolol succinate (TOPROL-XL) 50 MG 24 hr tablet TAKE 1 TABLET BY MOUTH ONCE DAILY WITH OR IMMEDIATELY FOLLOWING A MEAL   omeprazole (PRILOSEC) 40 MG capsule Take 1 capsule (40 mg total) by mouth daily.   vitamin C (ASCORBIC ACID) 500 MG tablet Take 500 mg by mouth daily.   fluticasone (FLONASE) 50 MCG/ACT nasal spray Place 1 spray into both nostrils 2 (two) times daily as needed for allergies or rhinitis. (Patient not taking: Reported on 06/25/2022)   No facility-administered encounter medications on file as of 07/18/2022.    Past Surgical History:  Procedure Laterality Date   APPENDECTOMY  child   CARDIAC CATHETERIZATION  09/01/2000  @[facility]    patent RCA and Diagonal  grafts, atretic LIMA, moderate nonobstructive proxLAD, normal lvsf   CARDIAC CATHETERIZATION  04-21-2003  @[facility]    LIMA--LAD graft occluded, native LAD with nonobstructive disease,  patent diagonal/ rca grafts   CATARACT EXTRACTION     CATARACT EXTRACTION W/ INTRAOCULAR LENS IMPLANT Right 12/2018   CORONARY ANGIOPLASTY WITH STENT PLACEMENT  08/1997  @[facility]    ptca w/ stenting to rca   CORONARY ARTERY BYPASS GRAFT  06-06-1998  @[facility]     LIMA -- LAD, SVG -- Diagonal,  RIMA to RCA   CYSTOSCOPY N/A 03/09/2020   Procedure: CYSTOSCOPY FLEXIBLE;  Surgeon: Bjorn Pippin, MD;  Location: [facility];  Service: Urology;  Laterality: N/A;  NO SEEDS FOUND IN BLADDER   EYE SURGERY     INGUINAL HERNIA REPAIR Right 1974   LOOP RECORDER INSERTION N/A 08/30/2020   Procedure: LOOP RECORDER INSERTION;  Surgeon: Lanier Prude, MD;  Location: [facility];  Service: Cardiovascular;  Laterality: N/A;   PROSTATE BIOPSY  06/2017   x 4-5    RADIOACTIVE SEED IMPLANT N/A 03/09/2020   Procedure: RADIOACTIVE SEED IMPLANT/BRACHYTHERAPY IMPLANT;  Surgeon: Bjorn Pippin, MD;  Location: [facility];  Service: Urology;  Laterality: N/A;   70  SEEDS IMPLANTED   SPACE OAR INSTILLATION N/A 03/09/2020   Procedure: SPACE OAR INSTILLATION;  Surgeon: Bjorn Pippin, MD;  Location: [facility];  Service: Urology;  Laterality: N/A;   VENTRAL HERNIA REPAIR  09/ 2000 and recurrent repair 06/ 2001    Family History  Problem Relation Age of Onset   Lung cancer Mother    Dementia Father    Hyperlipidemia Father    Congestive Heart Failure Father    Post-traumatic stress disorder Son    Alcohol abuse Maternal Grandfather    Cancer Maternal Grandfather 39       stomach cancer    Heart attack Neg Hx    Stroke Neg Hx    Breast cancer Neg Hx    Colon cancer Neg Hx    Prostate cancer Neg Hx    Pancreatic cancer Neg Hx       Controlled substance contract: n/a    Review of Systems   Constitutional:  Negative for diaphoresis.  Eyes:  Negative for pain.  Respiratory:  Negative for shortness of breath.   Cardiovascular:  Negative for chest pain, palpitations and leg swelling.  Gastrointestinal:  Negative for abdominal pain.  Endocrine: Negative for polydipsia.  Skin:  Negative for rash.  Neurological:  Negative for dizziness, weakness and headaches.  Hematological:  Does not bruise/bleed easily.  All other systems reviewed and are negative.      Objective:   Physical Exam Vitals and nursing note reviewed.  Constitutional:      Appearance: Normal appearance. He is  well-developed.  HENT:     Head: Normocephalic.     Nose: Nose normal.     Mouth/Throat:     Mouth: Mucous membranes are moist.     Pharynx: Oropharynx is clear.  Eyes:     Pupils: Pupils are equal, round, and reactive to light.  Neck:     Thyroid: No thyroid mass or thyromegaly.     Vascular: No carotid bruit or JVD.     Trachea: Phonation normal.  Cardiovascular:     Rate and Rhythm: Normal rate and regular rhythm.  Pulmonary:     Effort: Pulmonary effort is normal. No respiratory distress.     Breath sounds: Normal breath sounds.  Abdominal:     General: Bowel sounds are normal.     Palpations: Abdomen is soft.     Tenderness: There is no abdominal tenderness.  Musculoskeletal:        General: Normal range of motion.     Cervical back: Normal range of motion and neck supple.  Lymphadenopathy:     Cervical: No cervical adenopathy.  Skin:    General: Skin is warm and dry.  Neurological:     Mental Status: He is alert and oriented to person, place, and time.  Psychiatric:        Behavior: Behavior normal.        Thought Content: Thought content normal.        Judgment: Judgment normal.    BP 110/66   Pulse 73   Temp 98.1 F (36.7 C) (Temporal)   Resp 20   Ht 6\' 1"  (1.854 m)   Wt 218 lb (98.9 kg)   SpO2 97%   BMI 28.76 kg/m   Hgba1c 5.6%       Assessment & Plan:    CHIJIOKE LUNGREN comes in today with chief complaint of Medical Management of Chronic Issues   Diagnosis and orders addressed:  1. Essential hypertension Low sodium diet - CMP14+EGFR - Lipid panel - CBC with Differential/Platelet - Microalbumin / creatinine urine ratio - Bayer DCA Hb A1c Waived - lisinopril (ZESTRIL) 2.5 MG tablet; Take 1 tablet (2.5 mg total) by mouth daily.  Dispense: 90 tablet; Refill: 1 - metoprolol succinate (TOPROL-XL) 50 MG 24 hr tablet; TAKE 1 TABLET BY MOUTH ONCE DAILY WITH OR IMMEDIATELY FOLLOWING A MEAL  Dispense: 90 tablet; Refill: 1  2. Mixed hyperlipidemia Low fat - CMP14+EGFR - Lipid panel - CBC with Differential/Platelet - Microalbumin / creatinine urine ratio - Bayer DCA Hb A1c Waived - atorvastatin (LIPITOR) 40 MG tablet; Take 1 tablet (40 mg total) by mouth daily.  Dispense: 90 tablet; Refill: 1  3. Type 2 diabetes mellitus without complication, without long-term current use of insulin (HCC) Continue to wtah carbs in diet - CMP14+EGFR - Lipid panel - CBC with Differential/Platelet - Microalbumin / creatinine urine ratio - Bayer DCA Hb A1c Waived - glipiZIDE (GLUCOTROL XL) 10 MG 24 hr tablet; Take 1 tablet (10 mg total) by mouth daily.  Dispense: 90 tablet; Refill: 1 - metFORMIN (GLUCOPHAGE) 1000 MG tablet; Take 1 tablet (1,000 mg total) by mouth 2 (two) times daily with a meal.  Dispense: 180 tablet; Refill: 1  4. Gastroesophageal reflux disease without esophagitis Avoid spicy foods Do not eat 2 hours prior to bedtime - omeprazole (PRILOSEC) 40 MG capsule; Take 1 capsule (40 mg total) by mouth daily.  Dispense: 90 capsule; Refill: 1  5. CKD stage 3 due to type 2 diabetes mellitus (HCC) Labs pending  6. Late effect of cerebrovascular accident (CVA)  7. BMI 35.0-35.9,adult Discussed diet and exercise for person with BMI >25 Will recheck weight in 3-6 months   8. Coronary atherosclerosis of autologous vein bypass graft without  angina Keep follow up with cardiology - clopidogrel (PLAVIX) 75 MG tablet; Take 1 tablet (75 mg total) by mouth daily.  Dispense: 90 tablet; Refill: 1   Labs pending Health Maintenance reviewed Diet and exercise encouraged  Follow up plan: 6 months   [name] Daphine Deutscher, FNP

## 2022-07-19 LAB — CMP14+EGFR
Albumin: 4.2 g/dL (ref 3.8–4.8)
Alkaline Phosphatase: 102 IU/L (ref 44–121)
Bilirubin Total: 0.4 mg/dL (ref 0.0–1.2)
Chloride: 106 mmol/L (ref 96–106)
Globulin, Total: 2.8 g/dL (ref 1.5–4.5)
Sodium: 142 mmol/L (ref 134–144)

## 2022-07-19 LAB — CBC WITH DIFFERENTIAL/PLATELET
Basos: 1 %
EOS (ABSOLUTE): 0.4 10*3/uL (ref 0.0–0.4)
Eos: 6 %
Hemoglobin: 12.2 g/dL — ABNORMAL LOW (ref 13.0–17.7)
Immature Grans (Abs): 0 10*3/uL (ref 0.0–0.1)
Lymphocytes Absolute: 1.1 10*3/uL (ref 0.7–3.1)
MCH: 26.7 pg (ref 26.6–33.0)
Monocytes Absolute: 0.5 10*3/uL (ref 0.1–0.9)
Monocytes: 8 %
Neutrophils Absolute: 4.2 10*3/uL (ref 1.4–7.0)
Neutrophils: 68 %
Platelets: 219 10*3/uL (ref 150–450)
RDW: 15.8 % — ABNORMAL HIGH (ref 11.6–15.4)

## 2022-07-19 LAB — MICROALBUMIN / CREATININE URINE RATIO
Creatinine, Urine: 250.5 mg/dL
Microalb/Creat Ratio: 11 mg/g creat (ref 0–29)
Microalbumin, Urine: 26.8 ug/mL

## 2022-07-19 LAB — LIPID PANEL
Chol/HDL Ratio: 3.6 ratio (ref 0.0–5.0)
Cholesterol, Total: 104 mg/dL (ref 100–199)
HDL: 29 mg/dL — ABNORMAL LOW (ref >39)
LDL Chol Calc (NIH): 56 mg/dL (ref 0–99)

## 2022-07-28 ENCOUNTER — Ambulatory Visit: Payer: Medicare PPO

## 2022-07-28 DIAGNOSIS — I639 Cerebral infarction, unspecified: Secondary | ICD-10-CM | POA: Diagnosis not present

## 2022-07-28 LAB — CUP PACEART REMOTE DEVICE CHECK
Date Time Interrogation Session: 20240712230925
Implantable Pulse Generator Implant Date: 20220818

## 2022-07-31 ENCOUNTER — Other Ambulatory Visit: Payer: Medicare PPO

## 2022-07-31 DIAGNOSIS — Z8546 Personal history of malignant neoplasm of prostate: Secondary | ICD-10-CM

## 2022-08-01 LAB — PSA: Prostate Specific Ag, Serum: 0.4 ng/mL (ref 0.0–4.0)

## 2022-08-04 ENCOUNTER — Ambulatory Visit: Payer: Medicare PPO | Admitting: Nurse Practitioner

## 2022-08-07 ENCOUNTER — Ambulatory Visit: Payer: Medicare PPO | Admitting: Urology

## 2022-08-11 NOTE — Progress Notes (Signed)
[facility] Summary Report / Loop Recorder 

## 2022-08-14 ENCOUNTER — Ambulatory Visit: Payer: Medicare PPO | Admitting: Urology

## 2022-08-14 ENCOUNTER — Encounter: Payer: Self-pay | Admitting: Urology

## 2022-08-14 VITALS — BP 93/56 | HR 73 | Ht 73.0 in | Wt 218.0 lb

## 2022-08-14 DIAGNOSIS — Z8546 Personal history of malignant neoplasm of prostate: Secondary | ICD-10-CM

## 2022-08-14 DIAGNOSIS — R351 Nocturia: Secondary | ICD-10-CM | POA: Diagnosis not present

## 2022-08-14 DIAGNOSIS — N403 Nodular prostate with lower urinary tract symptoms: Secondary | ICD-10-CM

## 2022-08-14 LAB — URINALYSIS, ROUTINE W REFLEX MICROSCOPIC
Bilirubin, UA: NEGATIVE
Glucose, UA: NEGATIVE
Leukocytes,UA: NEGATIVE
Nitrite, UA: NEGATIVE
RBC, UA: NEGATIVE
Specific Gravity, UA: 1.025 (ref 1.005–1.030)
Urobilinogen, Ur: 2 mg/dL — ABNORMAL HIGH (ref 0.2–1.0)
pH, UA: 6 (ref 5.0–7.5)

## 2022-08-14 LAB — MICROSCOPIC EXAMINATION: Bacteria, UA: NONE SEEN

## 2022-08-14 NOTE — Progress Notes (Signed)
Subjective:  1. History of prostate cancer   2. Nodular prostate with lower urinary tract symptoms   3. Nocturia     08/14/22: Jacob Rios returns today in f/u for his history of prostate cancer treated with a seed Implant on 03/09/20.  His PSA is down further to 0.4.  His IPSS is 9 with nocturia x 3 and some intermittency.  He has no hematuria.  He has no GI complains.  He has no weight loss or bone pain.  He has some chronic constipation.   01/30/22: Jacob Rios returns today in f/u.  His PSA is down further to 0.5.  His IPSS is 5 with nocturia x 3.  He has had some intermittent diarrhea but has been constipated for 4 days.  His weight is down but he has no bone pain.  He has no hematuria.   07/04/21: Jacob Rios returns today in f/u.   His PSA is down to 0.9 from 1.2 in 12/22.  It was 13.3 prior to the seed implant on 03/09/20.   He continues to have nocturia 3-4x.  He has had no hematuria or dysuria.  He has rare constipation but no bloody stools.  He has no weight loss or bone pain.  He has arthralgias.  His UA is clear today. His IPSS is 13.  01/03/21: Jacob Rios returns in f/u for the history below.  His PSA is down further to 1.2.  He is voiding well but has nocturia x 4 which is stable.  His IPSS is 6.  He had a stroke of the left eye since the last visit.  He was hospitalized with that for a week in August.   He had a Loop recorder placed in August for further monitoring and is now on Plavix.  He has had partial recovery of his vision.  UA is clear.     07/05/20: Jacob Rios returns today in f/u for his history of prostate cancer.  His PSA has fallen to 3.0 from 13.3 prior to treatment.   His IPSS is 10 with nocturia x 4.   His UA is ok today.    03/29/20: Jacob Rios returns today in f/u from his recent seed implant on 03/09/20.  He is voiding well with an IPSS of 8. He has nocturia x 4.  He has some hesitancy.  His UA has >30 RBC's.   He has felt a lump about an inch up in the rectum that is probably the SpaceOAR.  He has  mild dysuria.     11/22/19: Jacob Rios returns today following a prostate biopsy for his history of low risk prostate cancer on surveillance.   The biopsy demonstrated 3 positive cores with Gleason 6 in the right and left apical lateral cores and Gleason 7(3+4) in the right apical medial core.   The prostate volume was 36ml.    His most recent PSA was 13.3 and has been rising.   He had a UTI and went to the ER in late July 2020 and was given antibiotics but had Mx species on the culture.    He has not had any further UTI's.  He passed a stone last year.  His IPSS is10.  His UA today is clear.   He had an MRI fusion biopsy in 3/19 for surveillance of his low risk prostate cancer. He had a single core of 20% of gleason 6 disease in the left lateral apex. he had atypia in one core. His PSA was 8.5 in 9/19 and 9.0 in  1/19. He has no associated signs or symptoms.   He was found to have a T2a Nx Mx Gleason 6 prostate cancer with 5% in one left apical and one right apical core in 07/26/13. A repeat biopsy in 8/16 had a single core of Gleason 6 in the left mid lateral gland with 10% involvement. His PSA prior to this visit is up to 8.4 from 6.6 in 5/18. It had been stable for the last 2 years. His Prostate volume was 24ml initally and 34ml on the repeat. He has a history of a 10mm right base prostate nodule and BOO. He has no associated signs or symptoms.    IPSS     Row Name 08/14/22 1100         International Prostate Symptom Score   How often have you had the sensation of not emptying your bladder? Less than 1 in 5     How often have you had to urinate less than every two hours? Less than half the time     How often have you found you stopped and started again several times when you urinated? Less than 1 in 5 times     How often have you found it difficult to postpone urination? Not at All     How often have you had a weak urinary stream? Less than half the time     How often have you had to strain to  start urination? Not at All     How many times did you typically get up at night to urinate? 3 Times     Total IPSS Score 9       Quality of Life due to urinary symptoms   If you were to spend the rest of your life with your urinary condition just the way it is now how would you feel about that? Pleased                    ROS:  ROS:  A complete review of systems was performed.  All systems are negative except for pertinent findings as noted.   Review of Systems  HENT:  Positive for congestion, sinus pain and sore throat.     Allergies  Allergen Reactions   Morphine Shortness Of Breath and Swelling    Outpatient Encounter Medications as of 08/14/2022  Medication Sig   atorvastatin (LIPITOR) 40 MG tablet Take 1 tablet (40 mg total) by mouth daily.   Cholecalciferol (D3 ADULT PO) Take 1 tablet by mouth daily.   clopidogrel (PLAVIX) 75 MG tablet Take 1 tablet (75 mg total) by mouth daily.   glipiZIDE (GLUCOTROL XL) 10 MG 24 hr tablet Take 1 tablet (10 mg total) by mouth daily.   glucose blood (ONETOUCH VERIO) test strip Test 1X per day and as needed  Dx 250.02   Lancets (ONETOUCH ULTRASOFT) lancets Patient test 1X per day and prn  Dx 250.02   lisinopril (ZESTRIL) 2.5 MG tablet Take 1 tablet (2.5 mg total) by mouth daily.   metFORMIN (GLUCOPHAGE) 1000 MG tablet Take 1 tablet (1,000 mg total) by mouth 2 (two) times daily with a meal.   metoprolol succinate (TOPROL-XL) 50 MG 24 hr tablet TAKE 1 TABLET BY MOUTH ONCE DAILY WITH OR IMMEDIATELY FOLLOWING A MEAL   omeprazole (PRILOSEC) 40 MG capsule Take 1 capsule (40 mg total) by mouth daily.   vitamin C (ASCORBIC ACID) 500 MG tablet Take 500 mg by mouth daily.   [DISCONTINUED] fluticasone (FLONASE)  50 MCG/ACT nasal spray Place 1 spray into both nostrils 2 (two) times daily as needed for allergies or rhinitis. (Patient not taking: Reported on 08/14/2022)   No facility-administered encounter medications on file as of 08/14/2022.     Past Medical History:  Diagnosis Date   Cataract    CKD (chronic kidney disease), stage III (HCC)    followed by pcp   Coronary artery disease cardiologist--- dr Clifton James   08/ 1999  s/p  cath w/ PTCA and stenting to RCA;   05/ 2000 inferior wall MI , 06-06-1998 s/p cabg x3;   Last heart cath 2005,  2 patent grafts (diagnol and RCA) and occluded LIMA--LAD graft with normal LAD nonobstructive disease;  last nuclear study 11/ 2014 no evidence ishcemia, ef 40%   Full dentures    Hiatal hernia    History of acute inferior wall MI 05/1998   s/p  cabg   Hypertension    Hypertensive retinopathy    Mixed hyperlipidemia    Nocturia more than twice per night    PAD (peripheral artery disease) (HCC)    left common iliac artery stenosis per aorta ultrasound 03/ 2021 in epic and cardiology note   Prostate cancer [facility]) urologist--- dr Annabell Howells   first dx 07/ 2015 in active survillance until bx 11-11-2019,  Stage T2a, Gleason 3+4, PSA 13.3   S/P CABG x 3 06/06/1998   LIMA--LAD, SVG to Diagonal, RIMA to RCA   S/P primary angioplasty with coronary stent 08/1997   stent to RCA   Type 2 diabetes mellitus (HCC)    followed by pcp  (03-06-2020 checks blood sugar dialy in am,  fasting sugar-- 110-130)    Past Surgical History:  Procedure Laterality Date   APPENDECTOMY  child   CARDIAC CATHETERIZATION  09/01/2000  @[facility]    patent RCA and Diagonal grafts, atretic LIMA, moderate nonobstructive proxLAD, normal lvsf   CARDIAC CATHETERIZATION  04-21-2003  @[facility]    LIMA--LAD graft occluded, native LAD with nonobstructive disease,  patent diagonal/ rca grafts   CATARACT EXTRACTION     CATARACT EXTRACTION W/ INTRAOCULAR LENS IMPLANT Right 12/2018   CORONARY ANGIOPLASTY WITH STENT PLACEMENT  08/1997  @[facility]    ptca w/ stenting to rca   CORONARY ARTERY BYPASS GRAFT  06-06-1998  @[facility]     LIMA -- LAD, SVG -- Diagonal,  RIMA to RCA   CYSTOSCOPY N/A 03/09/2020   Procedure: CYSTOSCOPY FLEXIBLE;  Surgeon: Bjorn Pippin, MD;   Location: [facility];  Service: Urology;  Laterality: N/A;  NO SEEDS FOUND IN BLADDER   EYE SURGERY     INGUINAL HERNIA REPAIR Right 1974   LOOP RECORDER INSERTION N/A 08/30/2020   Procedure: LOOP RECORDER INSERTION;  Surgeon: Lanier Prude, MD;  Location: [facility];  Service: Cardiovascular;  Laterality: N/A;   PROSTATE BIOPSY  06/2017   x 4-5    RADIOACTIVE SEED IMPLANT N/A 03/09/2020   Procedure: RADIOACTIVE SEED IMPLANT/BRACHYTHERAPY IMPLANT;  Surgeon: Bjorn Pippin, MD;  Location: [facility];  Service: Urology;  Laterality: N/A;   70  SEEDS IMPLANTED   SPACE OAR INSTILLATION N/A 03/09/2020   Procedure: SPACE OAR INSTILLATION;  Surgeon: Bjorn Pippin, MD;  [facility]  Service: Urology;  Laterality: N/A;   VENTRAL HERNIA REPAIR  09/ 2000 and recurrent repair 06/ 2001    Social History   Socioeconomic History   Marital status: Married    Spouse name: Bonita Quin    Number of children: 3  Years of education: Not on file   Highest education level: Not on file  Occupational History   Occupation: part-time trucker    Comment: retired   Tobacco Use   Smoking status: Former    Current packs/day: 0.00    Average packs/day: 4.0 packs/day for 45.0 years (180.0 ttl pk-yrs)    Types: Cigarettes    Start date: 01/14/1952    Quit date: 01/13/1997    Years since quitting: 25.6   Smokeless tobacco: Never   Tobacco comments:    reports he began smoking at age 52  Vaping Use   Vaping status: Never Used  Substance and Sexual Activity   Alcohol use: No   Drug use: Never   Sexual activity: Not on file  Other Topics Concern   Not on file  Social History Narrative   Lives home with wife   Social Determinants of Health   Financial Resource Strain: Low Risk  (06/21/2020)   Overall Financial Resource Strain (CARDIA)    Difficulty of Paying Living Expenses: Not very hard  Food Insecurity: No Food Insecurity (06/21/2020)   Hunger  Vital Sign    Worried About Running Out of Food in the Last Year: Never true    Ran Out of Food in the Last Year: Never true  Transportation Needs: No Transportation Needs (06/21/2020)   PRAPARE - Administrator, Civil Service (Medical): No    Lack of Transportation (Non-Medical): No  Physical Activity: Sufficiently Active (06/21/2020)   Exercise Vital Sign    Days of Exercise per Week: 7 days    Minutes of Exercise per Session: 30 min  Stress: No Stress Concern Present (06/21/2020)   [employer] of Occupational Health - Occupational Stress Questionnaire    Feeling of Stress : Only a little  Social Connections: Socially Integrated (06/21/2020)   Social Connection and Isolation Panel [NHANES]    Frequency of Communication with Friends and Family: More than three times a week    Frequency of Social Gatherings with Friends and Family: More than three times a week    Attends Religious Services: More than 4 times per year    Active Member of Clubs or Organizations: Yes    Attends Banker Meetings: More than 4 times per year    Marital Status: Married  Catering manager Violence: Not At Risk (06/21/2020)   Humiliation, Afraid, Rape, and Kick questionnaire    Fear of Current or Ex-Partner: No    Emotionally Abused: No    Physically Abused: No    Sexually Abused: No    Family History  Problem Relation Age of Onset   Lung cancer Mother    Dementia Father    Hyperlipidemia Father    Congestive Heart Failure Father    Post-traumatic stress disorder Son    Alcohol abuse Maternal Grandfather    Cancer Maternal Grandfather 90       stomach cancer    Heart attack Neg Hx    Stroke Neg Hx    Breast cancer Neg Hx    Colon cancer Neg Hx    Prostate cancer Neg Hx    Pancreatic cancer Neg Hx        Objective: Vitals:   08/14/22 1131  BP: (!) 93/56  Pulse: 73      Physical Exam  Lab Results:  Lab Results  Component Value Date   PSA1 0.4 07/31/2022   PSA1  0.6 02/03/2022   PSA1 0.5 01/02/2022  BMET No results for input(s): "NA", "K", "CL", "CO2", "GLUCOSE", "BUN", "CREATININE", "CALCIUM" in the last 72 hours. PSA PSA  Date Value Ref Range Status  03/30/2019 10.4 (H) < OR = 4.0 ng/mL Final    Comment:    The total PSA value from this assay system is  standardized against the WHO standard. The test  result will be approximately 20% lower when compared  to the equimolar-standardized total PSA (Beckman  Coulter). Comparison of serial PSA results should be  interpreted with this fact in mind. . This test was performed using the Siemens  chemiluminescent method. Values obtained from  different assay methods cannot be used interchangeably. PSA levels, regardless of value, should not be interpreted as absolute evidence of the presence or absence of disease.   11/18/2013 5.0 (H) 0.0 - 4.0 ng/mL Final    Comment:    Roche ECLIA methodology. According to the American Urological Association, Serum PSA should decrease and remain at undetectable levels after radical prostatectomy. The AUA defines biochemical recurrence as an initial PSA value 0.2 ng/mL or greater followed by a subsequent confirmatory PSA value 0.2 ng/mL or greater. Values obtained with different assay methods or kits cannot be used interchangeably. Results cannot be interpreted as absolute evidence of the presence or absence of malignant disease.   05/20/2013 5.3 (H) 0.0 - 4.0 ng/mL Final    Comment:    Roche ECLIA methodology. According to the American Urological Association, Serum PSA should decrease and remain at undetectable levels after radical prostatectomy. The AUA defines biochemical recurrence as an initial PSA value 0.2 ng/mL or greater followed by a subsequent confirmatory PSA value 0.2 ng/mL or greater. Values obtained with different assay methods or kits cannot be used interchangeably. Results cannot be interpreted as absolute evidence of the  presence or absence of malignant disease.   No results found for: "TESTOSTERONE"  Lab Results  Component Value Date   PSA1 0.4 07/31/2022   PSA1 0.6 02/03/2022   PSA1 0.5 01/02/2022   Recent Results (from the past 2160 hour(s))  CUP PACEART REMOTE DEVICE CHECK     Status: None   Collection Time: 06/22/22 11:04 PM  Result Value Ref Range   Date Time Interrogation Session [id number]    Pulse Generator Manufacturer MERM    Pulse Gen Model LNQ22 LINQ II    Pulse Gen Serial Number [id number]    Clinic Name [facility]    Implantable Pulse Generator Type ICM/ILR    Implantable Pulse Generator Implant Date [id number]   Bayer DCA Hb A1c Waived     Status: None   Collection Time: 07/18/22  8:03 AM  Result Value Ref Range   HB A1C (BAYER DCA - WAIVED) 5.6 4.8 - 5.6 %    Comment:          Prediabetes: 5.7 - 6.4          Diabetes: >6.4          Glycemic control for adults with diabetes: <7.0   CMP14+EGFR     Status: Abnormal   Collection Time: 07/18/22  8:05 AM  Result Value Ref Range   Glucose 89 70 - 99 mg/dL   BUN 25 8 - 27 mg/dL   Creatinine, Ser 5.62 (H) 0.76 - 1.27 mg/dL   eGFR 39 (L) >13 YQ/MVH/8.46   BUN/Creatinine Ratio 14 10 - 24   Sodium 142 134 - 144 mmol/L   Potassium 5.1 3.5 - 5.2 mmol/L   Chloride 106 96 - 106  mmol/L   CO2 23 20 - 29 mmol/L   Calcium 9.3 8.6 - 10.2 mg/dL   Total Protein 7.0 6.0 - 8.5 g/dL   Albumin 4.2 3.8 - 4.8 g/dL   Globulin, Total 2.8 1.5 - 4.5 g/dL   Bilirubin Total 0.4 0.0 - 1.2 mg/dL   Alkaline Phosphatase 102 44 - 121 IU/L   AST 9 0 - 40 IU/L   ALT 11 0 - 44 IU/L  Lipid panel     Status: Abnormal   Collection Time: 07/18/22  8:05 AM  Result Value Ref Range   Cholesterol, Total 104 100 - 199 mg/dL   Triglycerides 99 0 - 149 mg/dL   HDL 29 (L) >44 mg/dL   VLDL Cholesterol Cal 19 5 - 40 mg/dL   LDL Chol Calc (NIH) 56 0 - 99 mg/dL   Chol/HDL Ratio 3.6 0.0 - 5.0 ratio    Comment:                                   T. Chol/HDL  Ratio                                             Men  Women                               1/2 Avg.Risk  3.4    3.3                                   Avg.Risk  5.0    4.4                                2X Avg.Risk  9.6    7.1                                3X Avg.Risk 23.4   11.0   CBC with Differential/Platelet     Status: Abnormal   Collection Time: 07/18/22  8:05 AM  Result Value Ref Range   WBC 6.2 3.4 - 10.8 x10E3/uL   RBC 4.57 4.14 - 5.80 x10E6/uL   Hemoglobin 12.2 (L) 13.0 - 17.7 g/dL   Hematocrit 01.0 (L) 27.2 - 51.0 %   MCV 82 79 - 97 fL   MCH 26.7 26.6 - 33.0 pg   MCHC 32.6 31.5 - 35.7 g/dL   RDW 53.6 (H) 64.4 - 03.4 %   Platelets 219 150 - 450 x10E3/uL   Neutrophils 68 Not Estab. %   Lymphs 17 Not Estab. %   Monocytes 8 Not Estab. %   Eos 6 Not Estab. %   Basos 1 Not Estab. %   Neutrophils Absolute 4.2 1.4 - 7.0 x10E3/uL   Lymphocytes Absolute 1.1 0.7 - 3.1 x10E3/uL   Monocytes Absolute 0.5 0.1 - 0.9 x10E3/uL   EOS (ABSOLUTE) 0.4 0.0 - 0.4 x10E3/uL   Basophils Absolute 0.0 0.0 - 0.2 x10E3/uL   Immature Granulocytes 0 Not Estab. %   Immature Grans (Abs) 0.0 0.0 - 0.1 x10E3/uL  Microalbumin / creatinine urine ratio  Status: None   Collection Time: 07/18/22  8:08 AM  Result Value Ref Range   Creatinine, Urine 250.5 Not Estab. mg/dL   Microalbumin, Urine 09.6 Not Estab. ug/mL   Microalb/Creat Ratio 11 0 - 29 mg/g creat    Comment:                        Normal:                0 -  29                        Moderately increased: 30 - 300                        Severely increased:       >300   CUP PACEART REMOTE DEVICE CHECK     Status: None   Collection Time: 07/25/22 11:09 PM  Result Value Ref Range   Date Time Interrogation Session [phone]    Pulse Generator Manufacturer MERM    Pulse Gen Model LNQ22 LINQ II    Pulse Gen Serial Number [id number]    Cl[facility]    Implantable Pulse Generator Type ICM/ILR    Implantable Pulse Generator  Implant Date [id number]   PSA     Status: None   Collection Time: 07/31/22  1:03 PM  Result Value Ref Range   Prostate Specific Ag, Serum 0.4 0.0 - 4.0 ng/mL    Comment: Roche ECLIA methodology. According to the American Urological Association, Serum PSA should decrease and remain at undetectable levels after radical prostatectomy. The AUA defines biochemical recurrence as an initial PSA value 0.2 ng/mL or greater followed by a subsequent confirmatory PSA value 0.2 ng/mL or greater. Values obtained with different assay methods or kits cannot be used interchangeably. Results cannot be interpreted as absolute evidence of the presence or absence of malignant disease.   Urinalysis, Routine w reflex microscopic     Status: Abnormal   Collection Time: 08/14/22 11:39 AM  Result Value Ref Range   Specific Gravity, UA 1.025 1.005 - 1.030   pH, UA 6.0 5.0 - 7.5   Color, UA Yellow Yellow   Appearance Ur Clear Clear   Leukocytes,UA Negative Negative   Protein,UA Trace Negative/Trace   Glucose, UA Negative Negative   Ketones, UA Trace (A) Negative   RBC, UA Negative Negative   Bilirubin, UA Negative Negative   Urobilinogen, Ur 2.0 (H) 0.2 - 1.0 mg/dL   Nitrite, UA Negative Negative   Microscopic Examination See below:   Microscopic Examination     Status: Abnormal   Collection Time: 08/14/22 11:39 AM   Urine  Result Value Ref Range   WBC, UA 0-5 0 - 5 /hpf   RBC, Urine 0-2 0 - 2 /hpf   Epithelial Cells (non renal) 0-10 0 - 10 /hpf   Casts Present (A) None seen /lpf   Cast Type Hyaline casts N/A   Bacteria, UA None seen None seen/Few     UA is clear.   Studies/Results:     Assessment & Plan: T2a Nx Mx Gleason 7(3+4) disease s/p seed implant.   He is doing well post op with a nice decline in the PSA.  He with return in 6 months with a PSA.   Microhematuria.  Resolved.  Nodular prostate with LUTS.   He has stable nocturia.   No orders of the  defined types were placed in this  encounter.     Orders Placed This Encounter  Procedures   Microscopic Examination   Urinalysis, Routine w reflex microscopic   PSA    Standing Status:   Future    Standing Expiration Date:   08/14/2023      Return in about 6 months (around 02/14/2023) for with PSA. .   CC: Bennie Pierini, FNP      Bjorn Pippin 08/15/2022 Patient ID: [name], male   DOB: 02-Sep-1945, 77 y.o.   MRN: [id number]

## 2022-09-01 ENCOUNTER — Ambulatory Visit (INDEPENDENT_AMBULATORY_CARE_PROVIDER_SITE_OTHER): Payer: Medicare PPO

## 2022-09-01 DIAGNOSIS — I639 Cerebral infarction, unspecified: Secondary | ICD-10-CM | POA: Diagnosis not present

## 2022-09-01 LAB — CUP PACEART REMOTE DEVICE CHECK
Date Time Interrogation Session: 20240818231351
Implantable Pulse Generator Implant Date: 20220818

## 2022-09-11 NOTE — Progress Notes (Signed)
[facility] Summary Report / Loop Recorder 

## 2022-09-25 ENCOUNTER — Ambulatory Visit: Payer: Medicare PPO | Admitting: Nurse Practitioner

## 2022-09-29 ENCOUNTER — Ambulatory Visit: Payer: Medicare PPO | Admitting: Nurse Practitioner

## 2022-09-29 ENCOUNTER — Encounter: Payer: Self-pay | Admitting: Nurse Practitioner

## 2022-09-29 VITALS — BP 107/63 | HR 74 | Temp 97.6°F | Resp 20 | Ht 73.0 in | Wt 221.0 lb

## 2022-09-29 DIAGNOSIS — R239 Unspecified skin changes: Secondary | ICD-10-CM

## 2022-09-29 NOTE — Progress Notes (Signed)
Subjective:    Patient ID: Jacob Rios, male    DOB: February 16, 1945, 77 y.o.   MRN: [id number]   Chief Complaint: Red spots on neck and back (For 2 months/)   HPI  Patient c/o red spots on back and left side of neck. Have been there for several months. Doe snot itch.  Patient Active Problem List   Diagnosis Date Noted   Drug-induced dizziness 06/25/2022   Late effect of cerebrovascular accident (CVA) 02/01/2021   Retinal artery occlusion, branch 08/28/2020   Malignant neoplasm of prostate (HCC) 12/20/2019   CKD stage 3 due to type 2 diabetes mellitus (HCC) 08/18/2016   Gastroesophageal reflux disease without esophagitis 02/19/2015   Hyperlipidemia associated with type 2 diabetes mellitus (HCC) 07/20/2014   BMI 35.0-35.9,adult 07/20/2014   Diabetes mellitus treated with oral medication (HCC) 10/21/2010   Essential hypertension 10/26/2008   CAD, AUTOLOGOUS BYPASS GRAFT 10/26/2008       Review of Systems  Constitutional:  Negative for diaphoresis.  Eyes:  Negative for pain.  Respiratory:  Negative for shortness of breath.   Cardiovascular:  Negative for chest pain, palpitations and leg swelling.  Gastrointestinal:  Negative for abdominal pain.  Endocrine: Negative for polydipsia.  Skin:  Negative for rash.  Neurological:  Negative for dizziness, weakness and headaches.  Hematological:  Does not bruise/bleed easily.  All other systems reviewed and are negative.      Objective:   Physical Exam Constitutional:      Appearance: Normal appearance. He is obese.  Cardiovascular:     Rate and Rhythm: Normal rate and regular rhythm.     Heart sounds: Normal heart sounds.  Pulmonary:     Breath sounds: Normal breath sounds.  Skin:    General: Skin is warm.  Neurological:     General: No focal deficit present.     Mental Status: He is alert and oriented to person, place, and time.  Psychiatric:        Mood and Affect: Mood normal.        Behavior: Behavior normal.     BP 107/63   Pulse 74   Temp 97.6 F (36.4 C) (Temporal)   Resp 20   Ht 6\' 1"  (1.854 m)   Wt 221 lb (100.2 kg)   SpO2 96%   BMI 29.16 kg/m         Assessment & Plan:  Jacob Rios in today with chief complaint of Red spots on neck and back (For 2 months/)   1. Recent skin changes Keep check of rash until see derm - Ambulatory referral to Dermatology    The above assessment and management plan was discussed with the patient. The patient verbalized understanding of and has agreed to the management plan. Patient is aware to call the clinic if symptoms persist or worsen. Patient is aware when to return to the clinic for a follow-up visit. Patient educated on when it is appropriate to go to the emergency department.   [name] Daphine Deutscher, FNP

## 2022-10-06 ENCOUNTER — Ambulatory Visit (INDEPENDENT_AMBULATORY_CARE_PROVIDER_SITE_OTHER): Payer: Medicare PPO

## 2022-10-06 DIAGNOSIS — I639 Cerebral infarction, unspecified: Secondary | ICD-10-CM

## 2022-10-06 LAB — CUP PACEART REMOTE DEVICE CHECK
Date Time Interrogation Session: 20240920230233
Implantable Pulse Generator Implant Date: 20220818

## 2022-10-08 ENCOUNTER — Encounter: Payer: Self-pay | Admitting: Nurse Practitioner

## 2022-10-08 ENCOUNTER — Ambulatory Visit: Payer: Medicare PPO | Admitting: Nurse Practitioner

## 2022-10-08 VITALS — BP 128/62 | HR 58 | Temp 98.0°F | Ht 73.0 in | Wt 222.8 lb

## 2022-10-08 DIAGNOSIS — S41101A Unspecified open wound of right upper arm, initial encounter: Secondary | ICD-10-CM | POA: Insufficient documentation

## 2022-10-08 DIAGNOSIS — L03113 Cellulitis of right upper limb: Secondary | ICD-10-CM | POA: Diagnosis not present

## 2022-10-08 DIAGNOSIS — H60312 Diffuse otitis externa, left ear: Secondary | ICD-10-CM | POA: Insufficient documentation

## 2022-10-08 MED ORDER — CIPROFLOXACIN-DEXAMETHASONE 0.3-0.1 % OT SUSP
4.0000 [drp] | Freq: Two times a day (BID) | OTIC | 0 refills | Status: DC
Start: 2022-10-08 — End: 2023-01-20

## 2022-10-08 MED ORDER — DOXYCYCLINE HYCLATE 100 MG PO CAPS
100.0000 mg | ORAL_CAPSULE | Freq: Two times a day (BID) | ORAL | 0 refills | Status: DC
Start: 2022-10-08 — End: 2023-01-20

## 2022-10-08 NOTE — Progress Notes (Signed)
Acute Office Visit  Subjective:     Patient ID: Jacob Rios, male    DOB: Jun 22, 1945, 77 y.o.   MRN: [id number]  Chief Complaint  Patient presents with   sore on arm    Sore on right arm has been there 3-4 months has not been able to heal because he keeps bumping it. Woke up this morning with it swollen. Says he was mowing his yard and a branch with thorns cut his arm few months ago    HPI Jacob Rios is a 77 yrs old male presents with his wife concerns with wound on righ arm for 43-months Wound Check: Patient presents for wound check. Patient has a 2x2 wound which is located on the right arm and right arm(s): upper. Current symptoms: is nto healing well, has bene there for 3 months now.. Pain is rated 1/10. Interventions to date: none. -  Otitis Externa: Patient presents with pressure.  Symptoms have been present for 3 weeks.  During that time there has not been pus draining out of the. Previous treatment: none.There has not been a recent history of swimming.   Active Ambulatory Problems    Diagnosis Date Noted   Essential hypertension 10/26/2008   CAD, AUTOLOGOUS BYPASS GRAFT 10/26/2008   Diabetes mellitus treated with oral medication (HCC) 10/21/2010   Hyperlipidemia associated with type 2 diabetes mellitus (HCC) 07/20/2014   BMI 35.0-35.9,adult 07/20/2014   Gastroesophageal reflux disease without esophagitis 02/19/2015   CKD stage 3 due to type 2 diabetes mellitus (HCC) 08/18/2016   Malignant neoplasm of prostate (HCC) 12/20/2019   Retinal artery occlusion, branch 08/28/2020   Late effect of cerebrovascular accident (CVA) 02/01/2021   Drug-induced dizziness 06/25/2022   Arm wound, right, initial encounter 10/08/2022   Acute diffuse otitis externa of left ear 10/08/2022   Resolved Ambulatory Problems    Diagnosis Date Noted   No Resolved Ambulatory Problems   Past Medical History:  Diagnosis Date   Cataract    CKD (chronic kidney disease), stage III (HCC)     Coronary artery disease cardiologist--- dr Clifton James   Full dentures    Hiatal hernia    History of acute inferior wall MI 05/1998   Hypertension    Hypertensive retinopathy    Mixed hyperlipidemia    Nocturia more than twice per night    PAD (peripheral artery disease) (HCC)    Prostate cancer [facility]) urologist--- dr Annabell Howells   S/P CABG x 3 06/06/1998   S/P primary angioplasty with coronary stent 08/1997   Type 2 diabetes mellitus (HCC)      Review of Systems  Constitutional:  Negative for chills, fever and malaise/fatigue.  HENT:  Negative for hearing loss and sore throat.   Eyes:  Negative for pain.  Respiratory:  Negative for cough, shortness of breath and wheezing.   Cardiovascular:  Negative for chest pain and leg swelling.  Gastrointestinal:  Negative for blood in stool, melena, nausea and vomiting.  Genitourinary:  Negative for hematuria and urgency.  Musculoskeletal:  Negative for falls and myalgias.  Skin:  Negative for itching and rash.  Neurological:  Negative for dizziness, weakness and headaches.  Endo/Heme/Allergies:  Negative for environmental allergies and polydipsia. Does not bruise/bleed easily.  Psychiatric/Behavioral:  Negative for suicidal ideas. The patient does not have insomnia.    Negative unless indicated in HPI    Objective:    BP 128/62   Pulse (!) 58   Temp 98 F (36.7 C) (Temporal)  Ht 6\' 1"  (1.854 m)   Wt 222 lb 12.8 oz (101.1 kg)   SpO2 97%   BMI 29.39 kg/m  BP Readings from Last 3 Encounters:  10/08/22 128/62  09/29/22 107/63  08/14/22 (!) 93/56   Wt Readings from Last 3 Encounters:  10/08/22 222 lb 12.8 oz (101.1 kg)  09/29/22 221 lb (100.2 kg)  08/14/22 218 lb (98.9 kg)      Physical Exam Vitals and nursing note reviewed.  Constitutional:      Appearance: Normal appearance.  HENT:     Head: Normocephalic and atraumatic.  Eyes:     Extraocular Movements: Extraocular movements intact.     Conjunctiva/sclera: Conjunctivae  normal.     Pupils: Pupils are equal, round, and reactive to light.  Cardiovascular:     Rate and Rhythm: Normal rate and regular rhythm.  Pulmonary:     Effort: Pulmonary effort is normal.     Breath sounds: Normal breath sounds.  Musculoskeletal:        General: Normal range of motion.     Right lower leg: No edema.     Left lower leg: No edema.  Skin:    General: Skin is warm and dry.     Findings: No rash.  Neurological:     Mental Status: He is alert and oriented to person, place, and time. Mental status is at baseline.  Psychiatric:        Mood and Affect: Mood normal.        Behavior: Behavior normal.        Thought Content: Thought content normal.        Judgment: Judgment normal.   No results found for any visits on 10/08/22.      Assessment & Plan:  Acute diffuse otitis externa of left ear -     Ciprofloxacin-dexAMETHasone; Place 4 drops into the left ear 2 (two) times daily.  Dispense: 7.5 mL; Refill: 0  Arm wound, right, initial encounter -     Doxycycline Hyclate; Take 1 capsule (100 mg total) by mouth 2 (two) times daily.  Dispense: 14 capsule; Refill: 0  Cellulitis of right upper extremity -     Doxycycline Hyclate; Take 1 capsule (100 mg total) by mouth 2 (two) times daily.  Dispense: 14 capsule; Refill: 0   Jacob Rios is a77 yrs old male, no acute distress  Abscess right arm : start doxycycline (VIBRAMYCIN) 100 MG capsule Otitis Externa: CIPRODEX drops  Encourage healthy lifestyle choices, including diet (rich in fruits, vegetables, and lean proteins, and low in salt and simple carbohydrates) and exercise (at least 30 minutes of moderate physical activity daily).     The above assessment and management plan was discussed with the patient. The patient verbalized understanding of and has agreed to the management plan. Patient is aware to call the clinic if they develop any new symptoms or if symptoms persist or worsen. Patient is aware when to return to the  clinic for a follow-up visit. Patient educated on when it is appropriate to go to the emergency department.   Return if symptoms worsen or fail to improve.  Arrie Aran Santa Lighter, DNP [facility] [address] [place], [place] [address] [phone]

## 2022-10-17 NOTE — Progress Notes (Signed)
[facility] Summary Report / Loop Recorder 

## 2022-11-10 ENCOUNTER — Ambulatory Visit (INDEPENDENT_AMBULATORY_CARE_PROVIDER_SITE_OTHER): Payer: Medicare PPO

## 2022-11-10 DIAGNOSIS — I639 Cerebral infarction, unspecified: Secondary | ICD-10-CM

## 2022-11-11 LAB — CUP PACEART REMOTE DEVICE CHECK
Date Time Interrogation Session: 20241027230554
Implantable Pulse Generator Implant Date: 20220818

## 2022-11-13 NOTE — Progress Notes (Signed)
[facility] - Clinic Note  11/17/2022     CHIEF COMPLAINT Patient presents for Retina Follow Up   HISTORY OF PRESENT ILLNESS: Jacob Rios is a 77 y.o. male who presents to the clinic today for:   HPI     Retina Follow Up   Patient presents with  CRVO/BRVO.  In left eye.  This started 1 year ago.  Duration of 1 year.  Since onset it is stable.        Comments   1 year retina follow up up CRVO OS pt is reporting no vision changes noticed he denies any flashes has floater in OS pt last reading 115 this am last 5.7      Last edited by Etheleen Mayhew, COT on 11/17/2022  8:36 AM.      Referring physician: Bennie Pierini, FNP [address] [place],  [place] [address]  HISTORICAL INFORMATION:   Selected notes from the MEDICAL RECORD NUMBER Referred by Dr. Conley Rolls for BRAO OS LEE:  Ocular Hx- PMH-    CURRENT MEDICATIONS: No current outpatient medications on file. (Ophthalmic Drugs)   No current facility-administered medications for this visit. (Ophthalmic Drugs)   Current Outpatient Medications (Other)  Medication Sig   atorvastatin (LIPITOR) 40 MG tablet Take 1 tablet (40 mg total) by mouth daily.   Cholecalciferol (D3 ADULT PO) Take 1 tablet by mouth daily.   ciprofloxacin-dexamethasone (CIPRODEX) OTIC suspension Place 4 drops into the left ear 2 (two) times daily.   clopidogrel (PLAVIX) 75 MG tablet Take 1 tablet (75 mg total) by mouth daily.   doxycycline (VIBRAMYCIN) 100 MG capsule Take 1 capsule (100 mg total) by mouth 2 (two) times daily.   glipiZIDE (GLUCOTROL XL) 10 MG 24 hr tablet Take 1 tablet (10 mg total) by mouth daily.   glucose blood (ONETOUCH VERIO) test strip Test 1X per day and as needed  Dx 250.02   Lancets (ONETOUCH ULTRASOFT) lancets Patient test 1X per day and prn  Dx 250.02   lisinopril (ZESTRIL) 2.5 MG tablet Take 1 tablet (2.5 mg total) by mouth daily.   metFORMIN (GLUCOPHAGE) 1000 MG tablet Take 1 tablet  (1,000 mg total) by mouth 2 (two) times daily with a meal.   metoprolol succinate (TOPROL-XL) 50 MG 24 hr tablet TAKE 1 TABLET BY MOUTH ONCE DAILY WITH OR IMMEDIATELY FOLLOWING A MEAL   omeprazole (PRILOSEC) 40 MG capsule Take 1 capsule (40 mg total) by mouth daily.   vitamin C (ASCORBIC ACID) 500 MG tablet Take 500 mg by mouth daily.   No current facility-administered medications for this visit. (Other)   REVIEW OF SYSTEMS: ROS   Positive for: Gastrointestinal, Genitourinary, Endocrine, Eyes Negative for: Constitutional, Neurological, Skin, Musculoskeletal, HENT, Cardiovascular, Respiratory, Psychiatric, Allergic/Imm, Heme/Lymph Last edited by Etheleen Mayhew, COT on 11/17/2022  8:35 AM.      ALLERGIES Allergies  Allergen Reactions   Morphine Shortness Of Breath and Swelling   PAST MEDICAL HISTORY Past Medical History:  Diagnosis Date   Cataract    CKD (chronic kidney disease), stage III (HCC)    followed by pcp   Coronary artery disease cardiologist--- dr Clifton James   08/ 1999  s/p  cath w/ PTCA and stenting to RCA;   05/ 2000 inferior wall MI , 06-06-1998 s/p cabg x3;   Last heart cath 2005,  2 patent grafts (diagnol and RCA) and occluded LIMA--LAD graft with normal LAD nonobstructive disease;  last nuclear study 11/ 2014 no  evidence ishcemia, ef 40%   Full dentures    Hiatal hernia    History of acute inferior wall MI 05/1998   s/p  cabg   Hypertension    Hypertensive retinopathy    Mixed hyperlipidemia    Nocturia more than twice per night    PAD (peripheral artery disease) (HCC)    left common iliac artery stenosis per aorta ultrasound 03/ 2021 in epic and cardiology note   Prostate cancer [facility]) urologist--- dr Annabell Howells   first dx 07/ 2015 in active survillance until bx 11-11-2019,  Stage T2a, Gleason 3+4, PSA 13.3   S/P CABG x 3 06/06/1998   LIMA--LAD, SVG to Diagonal, RIMA to RCA   S/P primary angioplasty with coronary stent 08/1997   stent to RCA   Type 2  diabetes mellitus (HCC)    followed by pcp  (03-06-2020 checks blood sugar dialy in am,  fasting sugar-- 110-130)   Past Surgical History:  Procedure Laterality Date   APPENDECTOMY  child   CARDIAC CATHETERIZATION  09/01/2000  @[facility]    patent RCA and Diagonal grafts, atretic LIMA, moderate nonobstructive proxLAD, normal lvsf   CARDIAC CATHETERIZATION  04-21-2003  @[facility]    LIMA--LAD graft occluded, native LAD with nonobstructive disease,  patent diagonal/ rca grafts   CATARACT EXTRACTION     CATARACT EXTRACTION W/ INTRAOCULAR LENS IMPLANT Right 12/2018   CORONARY ANGIOPLASTY WITH STENT PLACEMENT  08/1997  @[facility]    ptca w/ stenting to rca   CORONARY ARTERY BYPASS GRAFT  06-06-1998  @[facility]     LIMA -- LAD, SVG -- Diagonal,  RIMA to RCA   CYSTOSCOPY N/A 03/09/2020   Procedure: CYSTOSCOPY FLEXIBLE;  Surgeon: Bjorn Pippin, MD;  Location: [facility];  Service: Urology;  Laterality: N/A;  NO SEEDS FOUND IN BLADDER   EYE SURGERY     INGUINAL HERNIA REPAIR Right 1974   LOOP RECORDER INSERTION N/A 08/30/2020   Procedure: LOOP RECORDER INSERTION;  Surgeon: Lanier Prude, MD;  Location: [facility] INVASIVE CV [facility];  Service: Cardiovascular;  Laterality: N/A;   PROSTATE BIOPSY  06/2017   x 4-5    RADIOACTIVE SEED IMPLANT N/A 03/09/2020   Procedure: RADIOACTIVE SEED IMPLANT/BRACHYTHERAPY IMPLANT;  Surgeon: Bjorn Pippin, MD;  Location: [facility];  Service: Urology;  Laterality: N/A;   70  SEEDS IMPLANTED   SPACE OAR INSTILLATION N/A 03/09/2020   Procedure: SPACE OAR INSTILLATION;  Surgeon: Bjorn Pippin, MD;  Location: [facility];  Service: Urology;  Laterality: N/A;   VENTRAL HERNIA REPAIR  09/ 2000 and recurrent repair 06/ 2001   FAMILY HISTORY Family History  Problem Relation Age of Onset   Lung cancer Mother    Dementia Father    Hyperlipidemia Father    Congestive Heart Failure Father    Post-traumatic stress disorder Son    Alcohol abuse Maternal Grandfather     Cancer Maternal Grandfather 17       stomach cancer    Heart attack Neg Hx    Stroke Neg Hx    Breast cancer Neg Hx    Colon cancer Neg Hx    Prostate cancer Neg Hx    Pancreatic cancer Neg Hx    SOCIAL HISTORY Social History   Tobacco Use   Smoking status: Former    Current packs/day: 0.00    Average packs/day: 4.0 packs/day for 45.0 years (180.0 ttl pk-yrs)    Types: Cigarettes    Start date: 01/14/1952    Quit date: 01/13/1997  Years since quitting: 25.8   Smokeless tobacco: Never   Tobacco comments:    reports he began smoking at age 62  Vaping Use   Vaping status: Never Used  Substance Use Topics   Alcohol use: No   Drug use: Never     There were no vitals filed for this visit.     OPHTHALMIC EXAM:  Base Eye Exam     Visual Acuity (Snellen - Linear)       Right Left   Dist D[place] 20/20 20/20         Tonometry (Tonopen, 8:40 AM)       Right Left   Pressure 15 15         Pupils       Pupils Dark Light Shape React APD   Right PERRL 4 3 Round Brisk None   Left PERRL 4 3 Round Brisk None         Visual Fields       Left Right    Full Full         Extraocular Movement       Right Left    Full, Ortho Full, Ortho         Neuro/Psych     Oriented x3: Yes   Mood/Affect: Normal         Dilation     Both eyes: 2.5% Phenylephrine @ 8:40 AM           Slit Lamp and Fundus Exam     Slit Lamp Exam       Right Left   Lids/Lashes Dermatochalasis - upper lid, Meibomian gland dysfunction Dermatochalasis - upper lid   Conjunctiva/Sclera nasal and temporal pinguecula nasal and temporal pinguecula   Cornea arcus, well healed cataract wound, fine endo pigment, mild EBMD mild arcus, well healed cataract wound   Anterior Chamber deep, clear, narrow temporal angle deep, clear, narrow temporal angle   Iris Round and dilated, mild PPM inferiorly Round and dilated, mild PPM inferiorly, No NVI   Lens PC IOL in good position PC IOL in good  position   Anterior Vitreous Vitreous syneresis mild syneresis         Fundus Exam       Right Left   Disc Pink and Sharp mild Pallor, Sharp rim, temporal PPP   C/D Ratio 0.5 0.7   Macula Flat, Blunted foveal reflex, mild RPE mottling, No heme or edema Flat, good foveal reflex, mild atrophy superior mac, no heme   Vessels attenuated, Tortuous mild attenuation, mild tortuosity, mild copper wiring   Periphery Attached, No heme Attached, rare DBH superiorly            IMAGING AND PROCEDURES  Imaging and Procedures for 11/17/2022  OCT, Retina - OU - Both Eyes       Right Eye Quality was good. Central Foveal Thickness: 290. Progression has been stable. Findings include normal foveal contour, no IRF, no SRF, vitreomacular adhesion .   Left Eye Quality was good. Central Foveal Thickness: 254. Progression has improved. Findings include no IRF, no SRF, abnormal foveal contour, intraretinal hyper-reflective material, inner retinal atrophy (inner retinal atrophy superior macula-- stable 9Old BRAO), partial PVD).   Notes *Images captured and stored on drive  Diagnosis / Impression:  OD: NFP, no IRF/SRF OS: superior BRAO -- iinner retinal atrophy superior macula-- stable 9Old BRAO), partial PVD   Clinical management:  See below  Abbreviations: NFP - Normal foveal profile. CME - cystoid macular  edema. PED - pigment epithelial detachment. IRF - intraretinal fluid. SRF - subretinal fluid. EZ - ellipsoid zone. ERM - epiretinal membrane. ORA - outer retinal atrophy. ORT - outer retinal tubulation. SRHM - subretinal hyper-reflective material. IRHM - intraretinal hyper-reflective material             ASSESSMENT/PLAN:    ICD-10-CM   1. BRAO (branch retinal artery occlusion), left  H34.232 OCT, Retina - OU - Both Eyes    2. Essential hypertension  I10     3. Hypertensive retinopathy of both eyes  H35.033     4. Diabetes mellitus without complication (HCC)  E11.9     5.  Diabetes mellitus treated with oral medication (HCC)  E11.9    Z79.84     6. Pseudophakia, both eyes  Z96.1      1. BRAO OS - pt reports history of painless inferior visual field loss OS -- onset August 27, 2020 - exam showed mild retinal whitening of superior macula and fovea; no emboli noted - FA (08.16.22) shows severely delayed filling time, but eventual perfusion of all retinal vessels OS - BCVA 20/20 from 20/40 - OCT OS shows superior BRAO -- inner retinal atrophy superior macula-- stable 9Old BRAO), partial PVD - ROS for GCA negative -- no headache, jaw claudication, scalp tenderness, numbness/tingling - pt was sent to ED for stroke work up on 8.16.22 -- admitted and MRI showed L parietal stroke -- started on plavix - exam today shows superior macular atrophy - no neovascularization; no NVG or elevated IOP - no retinal or ophthalmic intervention indicated or recommended at this time - F/U 1 year sooner prn -- DFE/OCT  2,3. Hypertensive retinopathy OU - discussed importance of tight BP control - monitor  4,5. Diabetes mellitus, type 2 without retinopathy - The incidence, risk factors for progression, natural history and treatment options for diabetic retinopathy  were discussed with patient.   - The need for close monitoring of blood glucose, blood pressure, and serum lipids, avoiding cigarette or any type of tobacco, and the need for long term follow up was also discussed with patient - A1C 5.6 (07.05.24) - f/u in 1 year, sooner prn   6. Pseudophakia OU  - s/p CE/IOL (Dr. Sherryll Burger, CEA, OS: Dec. 2020, OD: Sept, 2023)  - IOLs in good position, doing well  - monitor    Ophthalmic Meds Ordered this visit:  No orders of the defined types were placed in this encounter.    Return in about 1 year (around 11/17/2023) for f/u BRAO OS , DFE, OCT.  There are no Patient Instructions on file for this visit.   Explained the diagnoses, plan, and follow up with the patient and they  expressed understanding.  Patient expressed understanding of the importance of proper follow up care.   This document serves as a record of services personally performed by Karie Chimera, MD, PhD. It was created on their behalf by Annalee Genta, COMT. The creation of this record is the provider's dictation and/or activities during the visit.  Electronically signed by: Annalee Genta, COMT 11/17/22 9:20 AM  This document serves as a record of services personally performed by Karie Chimera, MD, PhD. It was created on their behalf by Charlette Caffey, COT an ophthalmic technician. The creation of this record is the provider's dictation and/or activities during the visit.    Electronically signed by:  Charlette Caffey, COT  11/17/22 9:20 AM   Karie Chimera, M.D., Ph.D.  Diseases & Surgery of the Retina and Vitreous [facility]    Abbreviations: M myopia (nearsighted); A astigmatism; H hyperopia (farsighted); P presbyopia; Mrx spectacle prescription;  CTL contact lenses; OD right eye; OS left eye; OU both eyes  XT exotropia; ET esotropia; PEK punctate epithelial keratitis; PEE punctate epithelial erosions; DES dry eye syndrome; MGD meibomian gland dysfunction; ATs artificial tears; PFAT's preservative free artificial tears; NSC nuclear sclerotic cataract; PSC posterior subcapsular cataract; ERM epi-retinal membrane; PVD posterior vitreous detachment; RD retinal detachment; DM diabetes mellitus; DR diabetic retinopathy; NPDR non-proliferative diabetic retinopathy; PDR proliferative diabetic retinopathy; CSME clinically significant macular edema; DME diabetic macular edema; dbh dot blot hemorrhages; CWS cotton wool spot; POAG primary open angle glaucoma; C/D cup-to-disc ratio; HVF humphrey visual field; GVF goldmann visual field; OCT optical coherence tomography; IOP intraocular pressure; BRVO Branch retinal vein occlusion; CRVO central retinal vein occlusion; CRAO central  retinal artery occlusion; BRAO branch retinal artery occlusion; RT retinal tear; SB scleral buckle; PPV pars plana vitrectomy; VH Vitreous hemorrhage; PRP panretinal laser photocoagulation; IVK intravitreal kenalog; VMT vitreomacular traction; MH Macular hole;  NVD neovascularization of the disc; NVE neovascularization elsewhere; AREDS age related eye disease study; ARMD age related macular degeneration; POAG primary open angle glaucoma; EBMD epithelial/anterior basement membrane dystrophy; ACIOL anterior chamber intraocular lens; IOL intraocular lens; PCIOL posterior chamber intraocular lens; Phaco/IOL phacoemulsification with intraocular lens placement; PRK photorefractive keratectomy; LASIK laser assisted in situ keratomileusis; HTN hypertension; DM diabetes mellitus; COPD chronic obstructive pulmonary disease

## 2022-11-17 ENCOUNTER — Ambulatory Visit (INDEPENDENT_AMBULATORY_CARE_PROVIDER_SITE_OTHER): Payer: Medicare PPO | Admitting: Ophthalmology

## 2022-11-17 ENCOUNTER — Encounter (INDEPENDENT_AMBULATORY_CARE_PROVIDER_SITE_OTHER): Payer: Self-pay | Admitting: Ophthalmology

## 2022-11-17 DIAGNOSIS — Z7984 Long term (current) use of oral hypoglycemic drugs: Secondary | ICD-10-CM | POA: Diagnosis not present

## 2022-11-17 DIAGNOSIS — Z961 Presence of intraocular lens: Secondary | ICD-10-CM

## 2022-11-17 DIAGNOSIS — H35033 Hypertensive retinopathy, bilateral: Secondary | ICD-10-CM | POA: Diagnosis not present

## 2022-11-17 DIAGNOSIS — H34232 Retinal artery branch occlusion, left eye: Secondary | ICD-10-CM | POA: Diagnosis not present

## 2022-11-17 DIAGNOSIS — I10 Essential (primary) hypertension: Secondary | ICD-10-CM | POA: Diagnosis not present

## 2022-11-17 DIAGNOSIS — E119 Type 2 diabetes mellitus without complications: Secondary | ICD-10-CM | POA: Diagnosis not present

## 2022-11-18 ENCOUNTER — Encounter (INDEPENDENT_AMBULATORY_CARE_PROVIDER_SITE_OTHER): Payer: Self-pay | Admitting: Ophthalmology

## 2022-11-28 NOTE — Progress Notes (Signed)
[facility] Summary Report / Loop Recorder 

## 2022-12-04 ENCOUNTER — Ambulatory Visit: Payer: Self-pay | Admitting: Nurse Practitioner

## 2022-12-04 DIAGNOSIS — M7022 Olecranon bursitis, left elbow: Secondary | ICD-10-CM | POA: Diagnosis not present

## 2022-12-04 NOTE — Telephone Encounter (Deleted)
Copied from CRM [id number]. Topic: Clinical - Red Word Triage >> Dec 04, 2022  2:37 PM Mosetta Putt H wrote: Reason for CRM: patient is having swelling in left arm in the last 30 min   Chief Complaint: Left arm  knots  on inside of the skin. It  looks a big bag of jelly  of  Symptoms:  Rates Pain as 4  out of 10.  Pertinent Negatives: Patient denies injury. Disposition: [] ED /[] Urgent Care (no appt availability in office) / [x] Appointment(In office/virtual)/ []  [place] [facility]/ [] Home Care/ [] Refused Recommended Disposition /[] [place]/ []  Follow-up with PCP Additional Notes:  RN Agent connected with office Clinical Access. Patient scheduled with DOD for 12/05/2022.   Rn Agent contacted patient back to inform of appointment for first thing with DOD. Patient refused and stated he will report the office for not having walk-in appointments to the [employer]. Patient ended the call. Reason for Disposition  MODERATE arm swelling (e.g., puffiness or swollen feeling of entire arm)  Fluid-filled sack located directly over tip (point) of elbow  Answer Assessment - Initial Assessment Questions 1. ONSET: "When did the swelling start?" (e.g., minutes, hours, days)     *** 2. LOCATION: "What part of the arm is swollen?"  "Are both arms swollen or just one arm?"     *** 3. SEVERITY: "How bad is the swelling?" (e.g., localized; mild, moderate, severe)   - LOCALIZED: Small area of puffiness or swelling on just one arm   - JOINT SWELLING: Swelling of one joint   - MILD: Puffiness or swelling of hand   - MODERATE: Puffiness or swollen feeling of entire arm    - SEVERE: All of arm looks swollen; pitting edema     *** 4. REDNESS: "Does the swelling look red or infected?"     *** 5. PAIN: "Is the swelling painful to touch?" If Yes, ask: "How painful is it?"   (Scale 1-10; mild, moderate or severe)     *** 6. FEVER: "Do you have a fever?" If Yes, ask: "What is it, how was it  measured, and when did it start?"      *** 7. CAUSE: "What do you think is causing the arm swelling?"     *** 8. MEDICAL HISTORY: "Do you have a history of heart failure, kidney disease, liver failure, or cancer?"     *** 9. RECURRENT SYMPTOM: "Have you had arm swelling before?" If Yes, ask: "When was the last time?" "What happened that time?"     *** 10. OTHER SYMPTOMS: "Do you have any other symptoms?" (e.g., chest pain, difficulty breathing)       *** 11. PREGNANCY: "Is there any chance you are pregnant?" "When was your last menstrual period?"       ***  Answer Assessment - Initial Assessment Questions 1. APPEARANCE of SWELLING: "What does it look like?"     *** 2. SIZE: "How large is the swelling?" (e.g., inches, cm; or compare to size of pinhead, tip of pen, eraser, coin, pea, grape, ping pong ball)      *** 3. LOCATION: "Where is the swelling located?"     *** 4. ONSET: "When did the swelling start?"     *** 5. COLOR: "What color is it?" "Is there more than one color?"     *** 6. PAIN: "Is there any pain?" If Yes, ask: "How bad is the pain?" (e.g., scale 1-10; or mild, moderate, severe)     -  NONE (0): no pain   - MILD (1-3): doesn't interfere with normal activities    - MODERATE (4-7): interferes with normal activities or awakens from sleep    - SEVERE (8-10): excruciating pain, unable to do any normal activities     *** 7. ITCH: "Does it itch?" If Yes, ask: "How bad is the itch?"      *** 8. CAUSE: "What do you think caused the swelling?" 9 OTHER SYMPTOMS: "Do you have any other symptoms?" (e.g., fever)     ***  Protocols used: Arm Swelling and Edema-A-AH, Skin Lump or Localized Swelling-A-AH, Elbow Swelling-A-AH

## 2022-12-04 NOTE — Telephone Encounter (Signed)
Copied from CRM [id number]. Topic: Clinical - Red Word Triage >> Dec 04, 2022  2:37 PM Mosetta Putt H wrote: Reason for CRM: patient is having swelling in left arm in the last 30 min   Chief Complaint: Left arm  knots  on inside of the skin. It  looks a big bag of jelly  of  Symptoms:  Rates Pain as 4  out of 10.  Pertinent Negatives: Patient denies injury. Disposition: [] ED /[] Urgent Care (no appt availability in office) / [x] Appointment(In office/virtual)/ []  [place] [facility]/ [] Home Care/ [] Refused Recommended Disposition /[] [place] [facility]/ []  Follow-up with PCP Additional Notes:  RN Agent connected with office Clinical Access. Patient scheduled with DOD for 12/05/2022.   Rn Agent contacted patient back to inform of appointment for first thing with DOD. Patient refused and stated he will report the office for not having walk-in appointments to the [employer]. Patient ended the call. Reason for Disposition  MODERATE arm swelling (e.g., puffiness or swollen feeling of entire arm)  Fluid-filled sack located directly over tip (point) of elbow  Answer Assessment - Initial Assessment Questions 1. ONSET: "When did the swelling start?" (e.g., minutes, hours, days)     30 minutes ago 2. LOCATION: "What part of the arm is swollen?"  "Are both arms swollen or just one arm?"     Left elbow 3. SEVERITY: "How bad is the swelling?"   Moderate - the size of a golf ball(e.g., localized; mild, moderate, severe)   - LOCALIZED: Small area of puffiness or swelling on just one arm   -  4. REDNESS: "Does the swelling look red or infected?"    Denies 5. PAIN: "Is the swelling painful to touch?" If Yes, ask: "How painful is it?"   (Scale 1-10; mild, moderate or severe)     Rates pain as 4 out 10 on scale 6. FEVER: "Do you have a fever?" If Yes, ask: "What is it, how was it measured, and when did it start?"     Denies. 7. CAUSE: "What do you think is causing the arm swelling?"     Denies  Injury. Denies insect bite. Patient states he was sitting on the couch.  9. RECURRENT SYMPTOM: "Have you had arm swelling before?" If Yes, ask: "When was the last time?" "What happened that time?"    Denies 10. OTHER SYMPTOMS: "Do you have any other symptoms?" (e.g., chest pain, difficulty breathing)       Denies  Protocols used: Skin Lump or Localized Swelling-A-AH, Elbow Swelling-A-AH

## 2022-12-05 ENCOUNTER — Ambulatory Visit: Payer: Medicare PPO

## 2022-12-14 LAB — CUP PACEART REMOTE DEVICE CHECK
Date Time Interrogation Session: 20241129230233
Implantable Pulse Generator Implant Date: 20220818

## 2022-12-15 ENCOUNTER — Ambulatory Visit (INDEPENDENT_AMBULATORY_CARE_PROVIDER_SITE_OTHER): Payer: Medicare PPO

## 2022-12-15 DIAGNOSIS — I639 Cerebral infarction, unspecified: Secondary | ICD-10-CM

## 2022-12-24 ENCOUNTER — Ambulatory Visit (INDEPENDENT_AMBULATORY_CARE_PROVIDER_SITE_OTHER): Payer: Medicare PPO

## 2022-12-24 VITALS — Ht 73.0 in | Wt 222.0 lb

## 2022-12-24 DIAGNOSIS — Z0000 Encounter for general adult medical examination without abnormal findings: Secondary | ICD-10-CM | POA: Diagnosis not present

## 2022-12-24 NOTE — Patient Instructions (Signed)
Mr. Buehrer , Thank you for taking time to come for your Medicare Wellness Visit. I appreciate your ongoing commitment to your health goals. Please review the following plan we discussed and let me know if I can assist you in the future.   Referrals/Orders/Follow-Ups/Clinician Recommendations: Aim for 30 minutes of exercise or brisk walking, 6-8 glasses of water, and 5 servings of fruits and vegetables each day.  This is a list of the screening recommended for you and due dates:  Health Maintenance  Topic Date Due   COVID-19 Vaccine (3 - Moderna risk series) 12/06/2019   Eye exam for diabetics  05/31/2022   DTaP/Tdap/Td vaccine (2 - Td or Tdap) 07/31/2022   Complete foot exam   08/03/2022   Hemoglobin A1C  01/18/2023   Yearly kidney function blood test for diabetes  07/18/2023   Yearly kidney health urinalysis for diabetes  07/18/2023   Medicare Annual Wellness Visit  12/24/2023   Pneumonia Vaccine  Completed   Flu Shot  Completed   Hepatitis C Screening  Completed   Zoster (Shingles) Vaccine  Completed   HPV Vaccine  Aged Out    Advanced directives: (ACP Link)Information on Advanced Care Planning can be found at [facility] Advance Health Care Directives Advance Health Care Directives ([email or web address]   Next Medicare Annual Wellness Visit scheduled for next year: Yes

## 2022-12-24 NOTE — Progress Notes (Signed)
Subjective:   Jacob Rios is a 77 y.o. male who presents for Medicare Annual/Subsequent preventive examination.  Visit Complete: Virtual I connected with  Jacob Rios on 12/24/22 by a audio enabled telemedicine application and verified that I am speaking with the correct person using two identifiers.  Patient Location: Home  Provider Location: Home Office  I discussed the limitations of evaluation and management by telemedicine. The patient expressed understanding and agreed to proceed.  Vital Signs: Because this visit was a virtual/telehealth visit, some criteria may be missing or patient reported. Any vitals not documented were not able to be obtained and vitals that have been documented are patient reported.  Cardiac Risk Factors include: advanced age (>65men, >23 women);diabetes mellitus;hypertension;dyslipidemia     Objective:    Today's Vitals   12/24/22 1340  Weight: 222 lb (100.7 kg)  Height: 6\' 1"  (1.854 m)   Body mass index is 29.29 kg/m.     12/24/2022    1:43 PM 06/21/2020    9:46 AM 04/04/2020    1:14 PM 03/09/2020    5:59 AM 12/20/2019    8:46 AM 06/21/2019    8:20 AM 07/15/2018   10:48 AM  Advanced Directives  Does Patient Have a Medical Advance Directive? No Yes Yes No No Yes No  Type of Special educational needs teacher of [place];Living will Healthcare Power of [place];Living will   Living will   Does patient want to make changes to medical advance directive?      No - Patient declined   Copy of Healthcare Power of Attorney in Chart?  No - copy requested       Would patient like information on creating a medical advance directive? Yes (MAU/Ambulatory/Procedural Areas - Information given)   Yes (MAU/Ambulatory/Procedural Areas - Information given) No - Patient declined      Current Medications (verified) Outpatient Encounter Medications as of 12/24/2022  Medication Sig   atorvastatin (LIPITOR) 40 MG tablet Take 1 tablet (40 mg total) by mouth daily.    Cholecalciferol (D3 ADULT PO) Take 1 tablet by mouth daily.   ciprofloxacin-dexamethasone (CIPRODEX) OTIC suspension Place 4 drops into the left ear 2 (two) times daily.   clopidogrel (PLAVIX) 75 MG tablet Take 1 tablet (75 mg total) by mouth daily.   glipiZIDE (GLUCOTROL XL) 10 MG 24 hr tablet Take 1 tablet (10 mg total) by mouth daily.   glucose blood (ONETOUCH VERIO) test strip Test 1X per day and as needed  Dx 250.02   Lancets (ONETOUCH ULTRASOFT) lancets Patient test 1X per day and prn  Dx 250.02   lisinopril (ZESTRIL) 2.5 MG tablet Take 1 tablet (2.5 mg total) by mouth daily.   metFORMIN (GLUCOPHAGE) 1000 MG tablet Take 1 tablet (1,000 mg total) by mouth 2 (two) times daily with a meal.   metoprolol succinate (TOPROL-XL) 50 MG 24 hr tablet TAKE 1 TABLET BY MOUTH ONCE DAILY WITH OR IMMEDIATELY FOLLOWING A MEAL   omeprazole (PRILOSEC) 40 MG capsule Take 1 capsule (40 mg total) by mouth daily.   vitamin C (ASCORBIC ACID) 500 MG tablet Take 500 mg by mouth daily.   doxycycline (VIBRAMYCIN) 100 MG capsule Take 1 capsule (100 mg total) by mouth 2 (two) times daily. (Patient not taking: Reported on 12/24/2022)   No facility-administered encounter medications on file as of 12/24/2022.    Allergies (verified) Morphine   History: Past Medical History:  Diagnosis Date   Cataract    CKD (chronic kidney disease), stage  III [facility])    followed by pcp   Coronary artery disease cardiologist--- dr Clifton James   08/ 1999  s/p  cath w/ PTCA and stenting to RCA;   05/ 2000 inferior wall MI , 06-06-1998 s/p cabg x3;   Last heart cath 2005,  2 patent grafts (diagnol and RCA) and occluded LIMA--LAD graft with normal LAD nonobstructive disease;  last nuclear study 11/ 2014 no evidence ishcemia, ef 40%   Full dentures    Hiatal hernia    History of acute inferior wall MI 05/1998   s/p  cabg   Hypertension    Hypertensive retinopathy    Mixed hyperlipidemia    Nocturia more than twice per night    PAD  (peripheral artery disease) (HCC)    left common iliac artery stenosis per aorta ultrasound 03/ 2021 in epic and cardiology note   Prostate cancer [facility]) urologist--- dr Annabell Howells   first dx 07/ 2015 in active survillance until bx 11-11-2019,  Stage T2a, Gleason 3+4, PSA 13.3   S/P CABG x 3 06/06/1998   LIMA--LAD, SVG to Diagonal, RIMA to RCA   S/P primary angioplasty with coronary stent 08/1997   stent to RCA   Type 2 diabetes mellitus (HCC)    followed by pcp  (03-06-2020 checks blood sugar dialy in am,  fasting sugar-- 110-130)   Past Surgical History:  Procedure Laterality Date   APPENDECTOMY  child   CARDIAC CATHETERIZATION  09/01/2000  @[facility]    patent RCA and Diagonal grafts, atretic LIMA, moderate nonobstructive proxLAD, normal lvsf   CARDIAC CATHETERIZATION  04-21-2003  @[facility]    LIMA--LAD graft occluded, native LAD with nonobstructive disease,  patent diagonal/ rca grafts   CATARACT EXTRACTION     CATARACT EXTRACTION W/ INTRAOCULAR LENS IMPLANT Right 12/2018   CORONARY ANGIOPLASTY WITH STENT PLACEMENT  08/1997  @[facility]    ptca w/ stenting to rca   CORONARY ARTERY BYPASS GRAFT  06-06-1998  @[facility]     LIMA -- LAD, SVG -- Diagonal,  RIMA to RCA   CYSTOSCOPY N/A 03/09/2020   Procedure: CYSTOSCOPY FLEXIBLE;  Surgeon: Bjorn Pippin, MD;  Location: [facility];  Service: Urology;  Laterality: N/A;  NO SEEDS FOUND IN BLADDER   EYE SURGERY     INGUINAL HERNIA REPAIR Right 1974   LOOP RECORDER INSERTION N/A 08/30/2020   Procedure: LOOP RECORDER INSERTION;  Surgeon: Lanier Prude, MD;  Location: [facility] INVASIVE CV [facility];  Service: Cardiovascular;  Laterality: N/A;   PROSTATE BIOPSY  06/2017   x 4-5    RADIOACTIVE SEED IMPLANT N/A 03/09/2020   Procedure: RADIOACTIVE SEED IMPLANT/BRACHYTHERAPY IMPLANT;  Surgeon: Bjorn Pippin, MD;  Location[facility];  Service: Urology;  Laterality: N/A;   70  SEEDS IMPLANTED   SPACE OAR INSTILLATION N/A 03/09/2020   Procedure: SPACE OAR  INSTILLATION;  Surgeon: Bjorn Pippin, MD;  Location: [facility];  Service: Urology;  Laterality: N/A;   VENTRAL HERNIA REPAIR  09/ 2000 and recurrent repair 06/ 2001   Family History  Problem Relation Age of Onset   Lung cancer Mother    Dementia Father    Hyperlipidemia Father    Congestive Heart Failure Father    Post-traumatic stress disorder Son    Alcohol abuse Maternal Grandfather    Cancer Maternal Grandfather 67       stomach cancer    Heart attack Neg Hx    Stroke Neg Hx    Breast cancer Neg Hx    Colon  cancer Neg Hx    Prostate cancer Neg Hx    Pancreatic cancer Neg Hx    Social History   Socioeconomic History   Marital status: Married    Spouse name: Bonita Quin    Number of children: 3   Years of education: Not on file   Highest education level: Not on file  Occupational History   Occupation: part-time trucker    Comment: retired   Tobacco Use   Smoking status: Former    Current packs/day: 0.00    Average packs/day: 4.0 packs/day for 45.0 years (180.0 ttl pk-yrs)    Types: Cigarettes    Start date: 01/14/1952    Quit date: 01/13/1997    Years since quitting: 25.9   Smokeless tobacco: Never   Tobacco comments:    reports he began smoking at age 79  Vaping Use   Vaping status: Never Used  Substance and Sexual Activity   Alcohol use: No   Drug use: Never   Sexual activity: Not on file  Other Topics Concern   Not on file  Social History Narrative   Lives home with wife   Social Determinants of Health   Financial Resource Strain: Low Risk  (12/24/2022)   Overall Financial Resource Strain (CARDIA)    Difficulty of Paying Living Expenses: Not hard at all  Food Insecurity: No Food Insecurity (12/24/2022)   Hunger Vital Sign    Worried About Running Out of Food in the Last Year: Never true    Ran Out of Food in the Last Year: Never true  Transportation Needs: No Transportation Needs (12/24/2022)   PRAPARE - Scientist, research (physical sciences) (Medical): No    Lack of Transportation (Non-Medical): No  Physical Activity: Sufficiently Active (12/24/2022)   Exercise Vital Sign    Days of Exercise per Week: 5 days    Minutes of Exercise per Session: 30 min  Stress: No Stress Concern Present (12/24/2022)   [employer] of Occupational Health - Occupational Stress Questionnaire    Feeling of Stress : Not at all  Social Connections: Socially Integrated (12/24/2022)   Social Connection and Isolation Panel [NHANES]    Frequency of Communication with Friends and Family: More than three times a week    Frequency of Social Gatherings with Friends and Family: Three times a week    Attends Religious Services: More than 4 times per year    Active Member of Clubs or Organizations: Yes    Attends Engineer, structural: More than 4 times per year    Marital Status: Married    Tobacco Counseling Counseling given: Not Answered Tobacco comments: reports he began smoking at age 10   Clinical Intake:  Pre-visit preparation completed: Yes  Pain : No/denies pain     Diabetes: Yes CBG done?: No Did pt. bring in CBG monitor from home?: No  How often do you need to have someone help you when you read instructions, pamphlets, or other written materials from your doctor or pharmacy?: 1 - Never  Interpreter Needed?: No  Information entered by :: Kandis Fantasia LPN   Activities of Daily Living    12/24/2022    1:41 PM  In your present state of health, do you have any difficulty performing the following activities:  Hearing? 0  Vision? 0  Difficulty concentrating or making decisions? 0  Walking or climbing stairs? 0  Dressing or bathing? 0  Doing errands, shopping? 0  Preparing Food and eating ? N  Using the Toilet? N  In the past six months, have you accidently leaked urine? N  Do you have problems with loss of bowel control? N  Managing your Medications? N  Managing your Finances? N  Housekeeping or  managing your Housekeeping? N    Patient Care Team: Bennie Pierini, FNP as PCP - General (Family Medicine) Kathleene Hazel, MD as PCP - Cardiology (Cardiology) Bjorn Pippin, MD as Attending Physician (Urology) Michaelle Copas, MD as Referring Physician (Optometry) Felicita Gage, RN as Oncology Nurse Navigator  Indicate any recent Medical Services you may have received from other than [facility] providers in the past year (date may be approximate).     Assessment:   This is a routine wellness examination for Adrean.  Hearing/Vision screen Hearing Screening - Comments:: Hard of hearing  Vision Screening - Comments::  up to date with routine eye exams with Dr. Conley Rolls     Goals Addressed   None   Depression Screen    12/24/2022    1:42 PM 10/08/2022   11:53 AM 09/29/2022   11:39 AM 07/18/2022   10:37 AM 02/03/2022    8:06 AM 01/30/2022   12:24 PM 08/02/2021    8:02 AM  PHQ 2/9 Scores  PHQ - 2 Score 0 0 0 0 0 0 0  PHQ- 9 Score  0 0 0 0  0    Fall Risk    12/24/2022    1:44 PM 10/08/2022   11:53 AM 09/29/2022   11:39 AM 07/18/2022   10:37 AM 02/03/2022    8:06 AM  Fall Risk   Falls in the past year? 0 0 0 0 0  Number falls in past yr: 0 0     Injury with Fall? 0 0     Risk for fall due to : No Fall Risks No Fall Risks     Follow up Falls prevention discussed;Education provided;Falls evaluation completed Falls evaluation completed       MEDICARE RISK AT HOME: Medicare Risk at Home Any stairs in or around the home?: No If so, are there any without handrails?: No Home free of loose throw rugs in walkways, pet beds, electrical cords, etc?: Yes Adequate lighting in your home to reduce risk of falls?: Yes Life alert?: No Use of a cane, walker or w/c?: No Grab bars in the bathroom?: Yes Shower chair or bench in shower?: No Elevated toilet seat or a handicapped toilet?: Yes  TIMED UP AND GO:  Was the test performed?  No    Cognitive Function:        12/24/2022     1:44 PM 06/21/2019    8:18 AM 05/27/2018    8:46 AM  6CIT Screen  What Year? 0 points 0 points 0 points  What month? 0 points 0 points 0 points  What time? 0 points 0 points 0 points  Count back from 20 0 points 0 points 0 points  Months in reverse 2 points 0 points 0 points  Repeat phrase 0 points 0 points 0 points  Total Score 2 points 0 points 0 points    Immunizations Immunization History  Administered Date(s) Administered   Fluad Quad(high Dose 65+) 10/26/2018, 11/01/2019, 11/01/2020   H1N1 11/10/2007   Influenza, High Dose Seasonal PF 10/26/2017, 10/22/2021   Influenza,inj,Quad PF,6+ Mos 11/03/2012, 10/24/2013, 10/16/2014, 10/16/2015, 10/20/2016   Moderna SARS-COV2 Booster Vaccination 11/08/2019   Moderna Sars-Covid-2 Vaccination 02/03/2019, 03/11/2019   Pneumococcal Conjugate-13 07/20/2014   Pneumococcal Polysaccharide-23 07/30/2012  Respiratory Syncytial Virus Vaccine,Recomb Aduvanted(Arexvy) 10/29/2021   Tdap 07/30/2012   Zoster Recombinant(Shingrix) 07/26/2019, 10/26/2019    TDAP status: Due, Education has been provided regarding the importance of this vaccine. Advised may receive this vaccine at local pharmacy or Health Dept. Aware to provide a copy of the vaccination record if obtained from local pharmacy or Health Dept. Verbalized acceptance and understanding.  Flu Vaccine status: Up to date  Pneumococcal vaccine status: Up to date  Covid-19 vaccine status: Information provided on how to obtain vaccines.   Qualifies for Shingles Vaccine? Yes   Zostavax completed No   Shingrix Completed?: Yes  Screening Tests Health Maintenance  Topic Date Due   COVID-19 Vaccine (3 - Moderna risk series) 12/06/2019   OPHTHALMOLOGY EXAM  05/31/2022   DTaP/Tdap/Td (2 - Td or Tdap) 07/31/2022   FOOT EXAM  08/03/2022   HEMOGLOBIN A1C  01/18/2023   Diabetic kidney evaluation - eGFR measurement  07/18/2023   Diabetic kidney evaluation - Urine ACR  07/18/2023   Medicare Annual  Wellness (AWV)  12/24/2023   Pneumonia Vaccine 46+ Years old  Completed   INFLUENZA VACCINE  Completed   Hepatitis C Screening  Completed   Zoster Vaccines- Shingrix  Completed   HPV VACCINES  Aged Out    Health Maintenance  Health Maintenance Due  Topic Date Due   COVID-19 Vaccine (3 - Moderna risk series) 12/06/2019   OPHTHALMOLOGY EXAM  05/31/2022   DTaP/Tdap/Td (2 - Td or Tdap) 07/31/2022   FOOT EXAM  08/03/2022    Colorectal cancer screening: No longer required.   Lung Cancer Screening: (Low Dose CT Chest recommended if Age 62-80 years, 20 pack-year currently smoking OR have quit w/in 15years.) does not qualify.   Lung Cancer Screening Referral: n/a  Additional Screening:  Hepatitis C Screening: does qualify; Completed 02/19/15  Vision Screening: Recommended annual ophthalmology exams for early detection of glaucoma and other disorders of the eye. Is the patient up to date with their annual eye exam?  Yes  Who is the provider or what is the name of the office in which the patient attends annual eye exams? Dr.Le and Dr. Vanessa Barbara  If pt is not established with a provider, would they like to be referred to a provider to establish care? No .   Dental Screening: Recommended annual dental exams for proper oral hygiene  Diabetic Foot Exam: Diabetic Foot Exam: Overdue, Pt has been advised about the importance in completing this exam. Pt is scheduled for diabetic foot exam on at next office visit.  Community Resource Referral / Chronic Care Management: CRR required this visit?  No   CCM required this visit?  No     Plan:     I have personally reviewed and noted the following in the patient's chart:   Medical and social history Use of alcohol, tobacco or illicit drugs  Current medications and supplements including opioid prescriptions. Patient is not currently taking opioid prescriptions. Functional ability and status Nutritional status Physical activity Advanced  directives List of other physicians Hospitalizations, surgeries, and ER visits in previous 12 months Vitals Screenings to include cognitive, depression, and falls Referrals and appointments  In addition, I have reviewed and discussed with patient certain preventive protocols, quality metrics, and best practice recommendations. A written personalized care plan for preventive services as well as general preventive health recommendations were provided to patient.     Kandis Fantasia [place], [place]   09/81/1914   After Visit Summary: (Mail) Due to this  being a telephonic visit, the after visit summary with patients personalized plan was offered to patient via mail   Nurse Notes: No concerns at this time

## 2023-01-12 DIAGNOSIS — S51801A Unspecified open wound of right forearm, initial encounter: Secondary | ICD-10-CM | POA: Diagnosis not present

## 2023-01-12 DIAGNOSIS — M1811 Unilateral primary osteoarthritis of first carpometacarpal joint, right hand: Secondary | ICD-10-CM | POA: Diagnosis not present

## 2023-01-12 DIAGNOSIS — M7989 Other specified soft tissue disorders: Secondary | ICD-10-CM | POA: Diagnosis not present

## 2023-01-12 DIAGNOSIS — M7022 Olecranon bursitis, left elbow: Secondary | ICD-10-CM | POA: Diagnosis not present

## 2023-01-12 DIAGNOSIS — X58XXXA Exposure to other specified factors, initial encounter: Secondary | ICD-10-CM | POA: Diagnosis not present

## 2023-01-12 DIAGNOSIS — S50859A Superficial foreign body of unspecified forearm, initial encounter: Secondary | ICD-10-CM | POA: Diagnosis not present

## 2023-01-12 DIAGNOSIS — Z189 Retained foreign body fragments, unspecified material: Secondary | ICD-10-CM | POA: Diagnosis not present

## 2023-01-13 ENCOUNTER — Ambulatory Visit: Payer: Medicare PPO

## 2023-01-19 ENCOUNTER — Ambulatory Visit: Payer: Medicare PPO

## 2023-01-19 DIAGNOSIS — I639 Cerebral infarction, unspecified: Secondary | ICD-10-CM

## 2023-01-19 LAB — CUP PACEART REMOTE DEVICE CHECK
Date Time Interrogation Session: 20250103230437
Implantable Pulse Generator Implant Date: 20220818

## 2023-01-20 ENCOUNTER — Other Ambulatory Visit: Payer: Medicare PPO

## 2023-01-20 ENCOUNTER — Encounter: Payer: Self-pay | Admitting: Nurse Practitioner

## 2023-01-20 ENCOUNTER — Ambulatory Visit: Payer: Medicare PPO | Admitting: Nurse Practitioner

## 2023-01-20 ENCOUNTER — Ambulatory Visit (INDEPENDENT_AMBULATORY_CARE_PROVIDER_SITE_OTHER): Payer: Medicare PPO

## 2023-01-20 VITALS — BP 115/66 | HR 75 | Temp 97.3°F | Ht 73.0 in

## 2023-01-20 DIAGNOSIS — E119 Type 2 diabetes mellitus without complications: Secondary | ICD-10-CM

## 2023-01-20 DIAGNOSIS — Z8673 Personal history of transient ischemic attack (TIA), and cerebral infarction without residual deficits: Secondary | ICD-10-CM | POA: Diagnosis not present

## 2023-01-20 DIAGNOSIS — E1169 Type 2 diabetes mellitus with other specified complication: Secondary | ICD-10-CM

## 2023-01-20 DIAGNOSIS — K219 Gastro-esophageal reflux disease without esophagitis: Secondary | ICD-10-CM

## 2023-01-20 DIAGNOSIS — I25810 Atherosclerosis of coronary artery bypass graft(s) without angina pectoris: Secondary | ICD-10-CM | POA: Diagnosis not present

## 2023-01-20 DIAGNOSIS — S50851D Superficial foreign body of right forearm, subsequent encounter: Secondary | ICD-10-CM

## 2023-01-20 DIAGNOSIS — E1122 Type 2 diabetes mellitus with diabetic chronic kidney disease: Secondary | ICD-10-CM | POA: Diagnosis not present

## 2023-01-20 DIAGNOSIS — E785 Hyperlipidemia, unspecified: Secondary | ICD-10-CM | POA: Diagnosis not present

## 2023-01-20 DIAGNOSIS — N183 Chronic kidney disease, stage 3 unspecified: Secondary | ICD-10-CM

## 2023-01-20 DIAGNOSIS — Z6835 Body mass index (BMI) 35.0-35.9, adult: Secondary | ICD-10-CM | POA: Diagnosis not present

## 2023-01-20 DIAGNOSIS — I10 Essential (primary) hypertension: Secondary | ICD-10-CM | POA: Diagnosis not present

## 2023-01-20 DIAGNOSIS — Z7984 Long term (current) use of oral hypoglycemic drugs: Secondary | ICD-10-CM

## 2023-01-20 DIAGNOSIS — M795 Residual foreign body in soft tissue: Secondary | ICD-10-CM | POA: Diagnosis not present

## 2023-01-20 LAB — CBC WITH DIFFERENTIAL/PLATELET
Basophils Absolute: 0.1 10*3/uL (ref 0.0–0.2)
Basos: 1 %
EOS (ABSOLUTE): 0.3 10*3/uL (ref 0.0–0.4)
Eos: 5 %
Hematocrit: 39.3 % (ref 37.5–51.0)
Hemoglobin: 13 g/dL (ref 13.0–17.7)
Immature Grans (Abs): 0 10*3/uL (ref 0.0–0.1)
Immature Granulocytes: 1 %
Lymphocytes Absolute: 1 10*3/uL (ref 0.7–3.1)
Lymphs: 15 %
MCH: 28.1 pg (ref 26.6–33.0)
MCHC: 33.1 g/dL (ref 31.5–35.7)
MCV: 85 fL (ref 79–97)
Monocytes Absolute: 0.5 10*3/uL (ref 0.1–0.9)
Monocytes: 7 %
Neutrophils Absolute: 4.6 10*3/uL (ref 1.4–7.0)
Neutrophils: 71 %
Platelets: 178 10*3/uL (ref 150–450)
RBC: 4.62 x10E6/uL (ref 4.14–5.80)
RDW: 14.9 % (ref 11.6–15.4)
WBC: 6.5 10*3/uL (ref 3.4–10.8)

## 2023-01-20 LAB — CMP14+EGFR
ALT: 13 [IU]/L (ref 0–44)
AST: 11 [IU]/L (ref 0–40)
Albumin: 4.2 g/dL (ref 3.8–4.8)
Alkaline Phosphatase: 93 [IU]/L (ref 44–121)
BUN/Creatinine Ratio: 16 (ref 10–24)
BUN: 24 mg/dL (ref 8–27)
Bilirubin Total: 0.4 mg/dL (ref 0.0–1.2)
CO2: 24 mmol/L (ref 20–29)
Calcium: 9.4 mg/dL (ref 8.6–10.2)
Chloride: 102 mmol/L (ref 96–106)
Creatinine, Ser: 1.49 mg/dL — ABNORMAL HIGH (ref 0.76–1.27)
Globulin, Total: 2.3 g/dL (ref 1.5–4.5)
Glucose: 97 mg/dL (ref 70–99)
Potassium: 4.5 mmol/L (ref 3.5–5.2)
Sodium: 140 mmol/L (ref 134–144)
Total Protein: 6.5 g/dL (ref 6.0–8.5)
eGFR: 48 mL/min/{1.73_m2} — ABNORMAL LOW (ref >59)

## 2023-01-20 LAB — LIPID PANEL
Chol/HDL Ratio: 3.7 {ratio} (ref 0.0–5.0)
Cholesterol, Total: 115 mg/dL (ref 100–199)
HDL: 31 mg/dL — ABNORMAL LOW (ref >39)
LDL Chol Calc (NIH): 65 mg/dL (ref 0–99)
Triglycerides: 103 mg/dL (ref 0–149)
VLDL Cholesterol Cal: 19 mg/dL (ref 5–40)

## 2023-01-20 LAB — BAYER DCA HB A1C WAIVED: HB A1C (BAYER DCA - WAIVED): 5.2 % (ref 4.8–5.6)

## 2023-01-20 MED ORDER — METFORMIN HCL 1000 MG PO TABS: 1000.0000 mg | ORAL_TABLET | Freq: Two times a day (BID) | ORAL | 1 refills | Status: DC

## 2023-01-20 MED ORDER — ATORVASTATIN CALCIUM 40 MG PO TABS: 40.0000 mg | ORAL_TABLET | Freq: Every day | ORAL | 1 refills | Status: DC

## 2023-01-20 MED ORDER — LISINOPRIL 2.5 MG PO TABS: 2.5000 mg | ORAL_TABLET | Freq: Every day | ORAL | 1 refills | Status: DC

## 2023-01-20 MED ORDER — CLOPIDOGREL BISULFATE 75 MG PO TABS
75.0000 mg | ORAL_TABLET | Freq: Every day | ORAL | 1 refills | Status: DC
Start: 2023-01-20 — End: 2023-03-17

## 2023-01-20 MED ORDER — OMEPRAZOLE 40 MG PO CPDR: 40.0000 mg | DELAYED_RELEASE_CAPSULE | Freq: Every day | ORAL | 1 refills | Status: DC

## 2023-01-20 MED ORDER — METOPROLOL SUCCINATE ER 50 MG PO TB24: ORAL_TABLET | ORAL | 1 refills | Status: DC

## 2023-01-20 MED ORDER — GLIPIZIDE ER 10 MG PO TB24
10.0000 mg | ORAL_TABLET | Freq: Every day | ORAL | 1 refills | Status: DC
Start: 2023-01-20 — End: 2023-07-21

## 2023-01-20 NOTE — Progress Notes (Signed)
 Subjective:    Patient ID: Jacob Rios, male    DOB: February 23, 1945, 78 y.o.   MRN: [id number]  Chief Complaint: medical management of chronic issues     HPI:  Jacob Rios is a 78 y.o. who identifies as a male who was assigned male at birth.   Social history: Lives with: by himself Work history: retired   Water Engineer in today for follow up of the following chronic medical issues:  1. Essential hypertension No c/o chest pain, sob or headache. Doe snot check blood pressure at home. BP Readings from Last 3 Encounters:  10/08/22 128/62  09/29/22 107/63  08/14/22 (!) 93/56     2. Mixed hyperlipidemia Does not really watch diet very closely Lab Results  Component Value Date   CHOL 104 07/18/2022   HDL 29 (L) 07/18/2022   LDLCALC 56 07/18/2022   TRIG 99 07/18/2022   CHOLHDL 3.6 07/18/2022     3. Gastroesophageal reflux disease without esophagitis Is on omeprazole  daily and is doing well.  4. 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 No changes made to plan of care.  5. Type 2 diabetes mellitus treated with oral medication Fasting blood sugars are running around 110-140. Lab Results  Component Value Date   HGBA1C 5.6 07/18/2022     6. CKD stage 3 due to type 2 diabetes mellitus (HCC) No voiding issues Lab Results  Component Value Date   CREATININE 1.81 (H) 07/18/2022     7. history of cerebrovascular accident (CVA) No permanent effects  8. BMI 35.0-35.9,adult No recent weight changes Wt Readings from Last 3 Encounters:  12/24/22 222 lb (100.7 kg)  10/08/22 222 lb 12.8 oz (101.1 kg)  09/29/22 221 lb (100.2 kg)   BMI Readings from Last 3 Encounters:  12/24/22 29.29 kg/m  10/08/22 29.39 kg/m  09/29/22 29.16 kg/m      New complaints: Possible foreign body right forearm  Allergies  Allergen Reactions   Morphine Shortness Of Breath and Swelling   Outpatient Encounter Medications as  of 01/20/2023  Medication Sig   atorvastatin  (LIPITOR) 40 MG tablet Take 1 tablet (40 mg total) by mouth daily.   Cholecalciferol (D3 ADULT PO) Take 1 tablet by mouth daily.   ciprofloxacin -dexamethasone  (CIPRODEX ) OTIC suspension Place 4 drops into the left ear 2 (two) times daily.   clopidogrel  (PLAVIX ) 75 MG tablet Take 1 tablet (75 mg total) by mouth daily.   doxycycline  (VIBRAMYCIN ) 100 MG capsule Take 1 capsule (100 mg total) by mouth 2 (two) times daily. (Patient not taking: Reported on 12/24/2022)   glipiZIDE  (GLUCOTROL  XL) 10 MG 24 hr tablet Take 1 tablet (10 mg total) by mouth daily.   glucose blood (ONETOUCH VERIO) test strip Test 1X per day and as needed  Dx 250.02   Lancets (ONETOUCH ULTRASOFT) lancets Patient test 1X per day and prn  Dx 250.02   lisinopril  (ZESTRIL ) 2.5 MG tablet Take 1 tablet (2.5 mg total) by mouth daily.   metFORMIN  (GLUCOPHAGE ) 1000 MG tablet Take 1 tablet (1,000 mg total) by mouth 2 (two) times daily with a meal.   metoprolol  succinate (TOPROL -XL) 50 MG 24 hr tablet TAKE 1 TABLET BY MOUTH ONCE DAILY WITH OR IMMEDIATELY FOLLOWING A MEAL   omeprazole  (PRILOSEC) 40 MG capsule Take 1 capsule (40 mg total) by mouth daily.   vitamin C (ASCORBIC ACID) 500 MG tablet Take 500 mg by mouth daily.   No facility-administered encounter medications on  file as of 01/20/2023.    Past Surgical History:  Procedure Laterality Date   APPENDECTOMY  child   CARDIAC CATHETERIZATION  09/01/2000  @[facility]    patent RCA and Diagonal grafts, atretic LIMA, moderate nonobstructive proxLAD, normal lvsf   CARDIAC CATHETERIZATION  04-21-2003  @[facility]    LIMA--LAD graft occluded, native LAD with nonobstructive disease,  patent diagonal/ rca grafts   CATARACT EXTRACTION     CATARACT EXTRACTION W/ INTRAOCULAR LENS IMPLANT Right 12/2018   CORONARY ANGIOPLASTY WITH STENT PLACEMENT  08/1997  @[facility]    ptca w/ stenting to rca   CORONARY ARTERY BYPASS GRAFT  06-06-1998  @[facility]     LIMA -- LAD, SVG -- Diagonal,   RIMA to RCA   CYSTOSCOPY N/A 03/09/2020   Procedure: CYSTOSCOPY FLEXIBLE;  Surgeon: Watt Rush, MD;  Location: [facility];  Service: Urology;  Laterality: N/A;  NO SEEDS FOUND IN BLADDER   EYE SURGERY     INGUINAL HERNIA REPAIR Right 1974   LOOP RECORDER INSERTION N/A 08/30/2020   Procedure: LOOP RECORDER INSERTION;  Surgeon: Cindie Ole DASEN, MD;  Location: [facility];  Service: Cardiovascular;  Laterality: N/A;   PROSTATE BIOPSY  06/2017   x 4-5    RADIOACTIVE SEED IMPLANT N/A 03/09/2020   Procedure: RADIOACTIVE SEED IMPLANT/BRACHYTHERAPY IMPLANT;  Surgeon: Watt Rush, MD;  Location: [facility];  Service: Urology;  Laterality: N/A;   70  SEEDS IMPLANTED   SPACE OAR INSTILLATION N/A 03/09/2020   Procedure: SPACE OAR INSTILLATION;  Surgeon: Watt Rush, MD;  Location: [facility];  Service: Urology;  Laterality: N/A;   VENTRAL HERNIA REPAIR  09/ 2000 and recurrent repair 06/ 2001    Family History  Problem Relation Age of Onset   Lung cancer Mother    Dementia Father    Hyperlipidemia Father    Congestive Heart Failure Father    Post-traumatic stress disorder Son    Alcohol  abuse Maternal Grandfather    Cancer Maternal Grandfather 52       stomach cancer    Heart attack Neg Hx    Stroke Neg Hx    Breast cancer Neg Hx    Colon cancer Neg Hx    Prostate cancer Neg Hx    Pancreatic cancer Neg Hx       Controlled substance contract: n/a      Review of Systems  Constitutional:  Negative for diaphoresis.  Eyes:  Negative for pain.  Respiratory:  Negative for shortness of breath.   Cardiovascular:  Negative for chest pain, palpitations and leg swelling.  Gastrointestinal:  Negative for abdominal pain.  Endocrine: Negative for polydipsia.  Skin:  Negative for rash.  Neurological:  Negative for dizziness, weakness and headaches.  Hematological:  Does not bruise/bleed easily.  All other systems reviewed and are  negative.      Objective:   Physical Exam Vitals and nursing note reviewed.  Constitutional:      Appearance: Normal appearance. He is well-developed.  HENT:     Head: Normocephalic.     Nose: Nose normal.     Mouth/Throat:     Mouth: Mucous membranes are moist.     Pharynx: Oropharynx is clear.  Eyes:     Pupils: Pupils are equal, round, and reactive to light.  Neck:     Thyroid : No thyroid  mass or thyromegaly.     Vascular: No carotid bruit or JVD.     Trachea: Phonation normal.  Cardiovascular:  Rate and Rhythm: Normal rate and regular rhythm.  Pulmonary:     Effort: Pulmonary effort is normal. No respiratory distress.     Breath sounds: Normal breath sounds.  Abdominal:     General: Bowel sounds are normal.     Palpations: Abdomen is soft.     Tenderness: There is no abdominal tenderness.  Musculoskeletal:        General: Normal range of motion.     Cervical back: Normal range of motion and neck supple.  Lymphadenopathy:     Cervical: No cervical adenopathy.  Skin:    General: Skin is warm and dry.  Neurological:     Mental Status: He is alert and oriented to person, place, and time.  Psychiatric:        Behavior: Behavior normal.        Thought Content: Thought content normal.        Judgment: Judgment normal.    BP 115/66   Pulse 75   Temp (!) 97.3 F (36.3 C) (Temporal)   Ht 6' 1 (1.854 m)   BMI 29.29 kg/m    HGBA1c 5.2%       Assessment & Plan:  Jacob Rios comes in today with chief complaint of Medical Management of Chronic Issues (Dm HAD LABS THIS MORNING )   Diagnosis and orders addressed:  1. Essential hypertension (Primary) Low sodium diet - CBC with Differential/Platelet - CMP14+EGFR - lisinopril  (ZESTRIL ) 2.5 MG tablet; Take 1 tablet (2.5 mg total) by mouth daily.  Dispense: 90 tablet; Refill: 1 - metoprolol  succinate (TOPROL -XL) 50 MG 24 hr tablet; TAKE 1 TABLET BY MOUTH ONCE DAILY WITH OR IMMEDIATELY FOLLOWING A MEAL   Dispense: 90 tablet; Refill: 1  2. Hyperlipidemia associated with type 2 diabetes mellitus (HCC) Low fat diet - Lipid panel - atorvastatin  (LIPITOR) 40 MG tablet; Take 1 tablet (40 mg total) by mouth daily.  Dispense: 90 tablet; Refill: 1  3. 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  Dispense: 90 tablet; Refill: 1  4. Diabetes mellitus treated with oral medication (HCC) Continue to watch carbs in diet - Bayer DCA Hb A1c Waived - glipiZIDE  (GLUCOTROL  XL) 10 MG 24 hr tablet; Take 1 tablet (10 mg total) by mouth daily.  Dispense: 90 tablet; Refill: 1 - metFORMIN  (GLUCOPHAGE ) 1000 MG tablet; Take 1 tablet (1,000 mg total) by mouth 2 (two) times daily with a meal.  Dispense: 180 tablet; Refill: 1  5. Gastroesophageal reflux disease without esophagitis Avoid spicy foods Do not eat 2 hours prior to bedtime  - omeprazole  (PRILOSEC) 40 MG capsule; Take 1 capsule (40 mg total) by mouth daily.  Dispense: 90 capsule; Refill: 1  6. CKD stage 3 due to type 2 diabetes mellitus (HCC) Labs pending  7. History of CVA in adulthood  8. BMI 35.0-35.9,adult Discussed diet and exercise for person with BMI >25 Will recheck weight in 3-6 months   9. Foreign body in right forearm, subsequent encounter No foreign body visible - DG Forearm Right   Labs pending Health Maintenance reviewed Diet and exercise encouraged  Follow up plan: 6 months   [name] Gladis, FNP

## 2023-01-20 NOTE — Patient Instructions (Signed)

## 2023-02-05 ENCOUNTER — Other Ambulatory Visit: Payer: Medicare PPO

## 2023-02-05 ENCOUNTER — Other Ambulatory Visit: Payer: Self-pay | Admitting: Urology

## 2023-02-05 DIAGNOSIS — Z8546 Personal history of malignant neoplasm of prostate: Secondary | ICD-10-CM

## 2023-02-05 MED ORDER — DOXYCYCLINE HYCLATE 100 MG PO CAPS
100.0000 mg | ORAL_CAPSULE | Freq: Two times a day (BID) | ORAL | 0 refills | Status: DC
Start: 2023-02-05 — End: 2023-03-17

## 2023-02-05 NOTE — Progress Notes (Signed)
[name] has a small red tender area on the right groin area that is consistent with folliculitis but there is no drainable area.    I will give him doxycycline and have him return for his scheduled appointment next week.

## 2023-02-06 LAB — PSA: Prostate Specific Ag, Serum: 0.4 ng/mL (ref 0.0–4.0)

## 2023-02-12 ENCOUNTER — Encounter: Payer: Self-pay | Admitting: Urology

## 2023-02-12 ENCOUNTER — Ambulatory Visit: Payer: Medicare PPO | Admitting: Urology

## 2023-02-12 VITALS — BP 120/62 | HR 76

## 2023-02-12 DIAGNOSIS — Z872 Personal history of diseases of the skin and subcutaneous tissue: Secondary | ICD-10-CM

## 2023-02-12 DIAGNOSIS — Z8546 Personal history of malignant neoplasm of prostate: Secondary | ICD-10-CM

## 2023-02-12 DIAGNOSIS — R351 Nocturia: Secondary | ICD-10-CM | POA: Diagnosis not present

## 2023-02-12 DIAGNOSIS — L739 Follicular disorder, unspecified: Secondary | ICD-10-CM

## 2023-02-12 DIAGNOSIS — N403 Nodular prostate with lower urinary tract symptoms: Secondary | ICD-10-CM

## 2023-02-12 LAB — URINALYSIS, ROUTINE W REFLEX MICROSCOPIC
Bilirubin, UA: NEGATIVE
Glucose, UA: NEGATIVE
Leukocytes,UA: NEGATIVE
Nitrite, UA: NEGATIVE
RBC, UA: NEGATIVE
Specific Gravity, UA: 1.03 (ref 1.005–1.030)
Urobilinogen, Ur: 2 mg/dL — ABNORMAL HIGH (ref 0.2–1.0)
pH, UA: 5.5 (ref 5.0–7.5)

## 2023-02-12 LAB — MICROSCOPIC EXAMINATION: Bacteria, UA: NONE SEEN

## 2023-02-12 NOTE — Progress Notes (Signed)
Subjective:  1. History of prostate cancer   2. Acute folliculitis   3. Nodular prostate with lower urinary tract symptoms   4. Nocturia     02/12/23: Jacob Rios returns today in f/u for his history below.  His PSA is stable at 0.4.  He was briefly seen on 1/23 when he came for labs and was started on doxycycline for folliculitis in the groin.  He reports that the lesion drained spontaneously and he is doing well with it.  HIs IPSS is 14 with nocturia x 2.  He has had no hematuria or bloody stool.  He had about 25 lb weight loss over the last 2 years with intent.  He has no bone pain.    08/14/22: Jacob Rios returns today in f/u for his history of prostate cancer treated with a seed Implant on 03/09/20.  His PSA is down further to 0.4.  His IPSS is 9 with nocturia x 3 and some intermittency.  He has no hematuria.  He has no GI complains.  He has no weight loss or bone pain.  He has some chronic constipation.   01/30/22: Jacob Rios returns today in f/u.  His PSA is down further to 0.5.  His IPSS is 5 with nocturia x 3.  He has had some intermittent diarrhea but has been constipated for 4 days.  His weight is down but he has no bone pain.  He has no hematuria.   07/04/21: Jacob Rios returns today in f/u.   His PSA is down to 0.9 from 1.2 in 12/22.  It was 13.3 prior to the seed implant on 03/09/20.   He continues to have nocturia 3-4x.  He has had no hematuria or dysuria.  He has rare constipation but no bloody stools.  He has no weight loss or bone pain.  He has arthralgias.  His UA is clear today. His IPSS is 13.  01/03/21: Jacob Rios returns in f/u for the history below.  His PSA is down further to 1.2.  He is voiding well but has nocturia x 4 which is stable.  His IPSS is 6.  He had a stroke of the left eye since the last visit.  He was hospitalized with that for a week in August.   He had a Loop recorder placed in August for further monitoring and is now on Plavix.  He has had partial recovery of his vision.  UA is clear.      07/05/20: Jacob Rios returns today in f/u for his history of prostate cancer.  His PSA has fallen to 3.0 from 13.3 prior to treatment.   His IPSS is 10 with nocturia x 4.   His UA is ok today.    03/29/20: Jacob Rios returns today in f/u from his recent seed implant on 03/09/20.  He is voiding well with an IPSS of 8. He has nocturia x 4.  He has some hesitancy.  His UA has >30 RBC's.   He has felt a lump about an inch up in the rectum that is probably the SpaceOAR.  He has mild dysuria.     11/22/19: Jacob Rios returns today following a prostate biopsy for his history of low risk prostate cancer on surveillance.   The biopsy demonstrated 3 positive cores with Gleason 6 in the right and left apical lateral cores and Gleason 7(3+4) in the right apical medial core.   The prostate volume was 36ml.    His most recent PSA was 13.3 and has been rising.  He had a UTI and went to the ER in late July 2020 and was given antibiotics but had Mx species on the culture.    He has not had any further UTI's.  He passed a stone last year.  His IPSS is10.  His UA today is clear.   He had an MRI fusion biopsy in 3/19 for surveillance of his low risk prostate cancer. He had a single core of 20% of gleason 6 disease in the left lateral apex. he had atypia in one core. His PSA was 8.5 in 9/19 and 9.0 in 1/19. He has no associated signs or symptoms.   He was found to have a T2a Nx Mx Gleason 6 prostate cancer with 5% in one left apical and one right apical core in 07/26/13. A repeat biopsy in 8/16 had a single core of Gleason 6 in the left mid lateral gland with 10% involvement. His PSA prior to this visit is up to 8.4 from 6.6 in 5/18. It had been stable for the last 2 years. His Prostate volume was 24ml initally and 34ml on the repeat. He has a history of a 10mm right base prostate nodule and BOO. He has no associated signs or symptoms.            ROS:  ROS:  A complete review of systems was performed.  All systems are  negative except for pertinent findings as noted.   Review of Systems  All other systems reviewed and are negative.   Allergies  Allergen Reactions   Morphine Shortness Of Breath and Swelling    Outpatient Encounter Medications as of 02/12/2023  Medication Sig   atorvastatin (LIPITOR) 40 MG tablet Take 1 tablet (40 mg total) by mouth daily.   Cholecalciferol (D3 ADULT PO) Take 1 tablet by mouth daily.   clopidogrel (PLAVIX) 75 MG tablet Take 1 tablet (75 mg total) by mouth daily.   doxycycline (VIBRAMYCIN) 100 MG capsule Take 1 capsule (100 mg total) by mouth every 12 (twelve) hours.   glipiZIDE (GLUCOTROL XL) 10 MG 24 hr tablet Take 1 tablet (10 mg total) by mouth daily.   glucose blood (ONETOUCH VERIO) test strip Test 1X per day and as needed  Dx 250.02   Lancets (ONETOUCH ULTRASOFT) lancets Patient test 1X per day and prn  Dx 250.02   lisinopril (ZESTRIL) 2.5 MG tablet Take 1 tablet (2.5 mg total) by mouth daily.   metFORMIN (GLUCOPHAGE) 1000 MG tablet Take 1 tablet (1,000 mg total) by mouth 2 (two) times daily with a meal.   metoprolol succinate (TOPROL-XL) 50 MG 24 hr tablet TAKE 1 TABLET BY MOUTH ONCE DAILY WITH OR IMMEDIATELY FOLLOWING A MEAL   omeprazole (PRILOSEC) 40 MG capsule Take 1 capsule (40 mg total) by mouth daily.   vitamin C (ASCORBIC ACID) 500 MG tablet Take 500 mg by mouth daily.   No facility-administered encounter medications on file as of 02/12/2023.    Past Medical History:  Diagnosis Date   Cataract    CKD (chronic kidney disease), stage III (HCC)    followed by pcp   Coronary artery disease cardiologist--- dr Clifton James   08/ 1999  s/p  cath w/ PTCA and stenting to RCA;   05/ 2000 inferior wall MI , 06-06-1998 s/p cabg x3;   Last heart cath 2005,  2 patent grafts (diagnol and RCA) and occluded LIMA--LAD graft with normal LAD nonobstructive disease;  last nuclear study 11/ 2014 no evidence ishcemia, ef 40%  Full dentures    Hiatal hernia    History of acute  inferior wall MI 05/1998   s/p  cabg   Hypertension    Hypertensive retinopathy    Mixed hyperlipidemia    Nocturia more than twice per night    PAD (peripheral artery disease) (HCC)    left common iliac artery stenosis per aorta ultrasound 03/ 2021 in epic and cardiology note   Prostate cancer [facility]) urologist--- dr Annabell Howells   first dx 07/ 2015 in active survillance until bx 11-11-2019,  Stage T2a, Gleason 3+4, PSA 13.3   S/P CABG x 3 06/06/1998   LIMA--LAD, SVG to Diagonal, RIMA to RCA   S/P primary angioplasty with coronary stent 08/1997   stent to RCA   Type 2 diabetes mellitus (HCC)    followed by pcp  (03-06-2020 checks blood sugar dialy in am,  fasting sugar-- 110-130)    Past Surgical History:  Procedure Laterality Date   APPENDECTOMY  child   CARDIAC CATHETERIZATION  09/01/2000  @[facility]    patent RCA and Diagonal grafts, atretic LIMA, moderate nonobstructive proxLAD, normal lvsf   CARDIAC CATHETERIZATION  04-21-2003  @[facility]    LIMA--LAD graft occluded, native LAD with nonobstructive disease,  patent diagonal/ rca grafts   CATARACT EXTRACTION     CATARACT EXTRACTION W/ INTRAOCULAR LENS IMPLANT Right 12/2018   CORONARY ANGIOPLASTY WITH STENT PLACEMENT  08/1997  @[facility]    ptca w/ stenting to rca   CORONARY ARTERY BYPASS GRAFT  06-06-1998  @[facility]     LIMA -- LAD, SVG -- Diagonal,  RIMA to RCA   CYSTOSCOPY N/A 03/09/2020   Procedure: CYSTOSCOPY FLEXIBLE;  Surgeon: Bjorn Pippin, MD;  Location: [facility];  Service: Urology;  Laterality: N/A;  NO SEEDS FOUND IN BLADDER   EYE SURGERY     INGUINAL HERNIA REPAIR Right 1974   LOOP RECORDER INSERTION N/A 08/30/2020   Procedure: LOOP RECORDER INSERTION;  Surgeon: Lanier Prude, MD;  Location: [facility];  Service: Cardiovascular;  Laterality: N/A;   PROSTATE BIOPSY  06/2017   x 4-5    RADIOACTIVE SEED IMPLANT N/A 03/09/2020   Procedure: RADIOACTIVE SEED IMPLANT/BRACHYTHERAPY IMPLANT;  Surgeon: Bjorn Pippin, MD;  Location:  [facility];  Service: Urology;  Laterality: N/A;   70  SEEDS IMPLANTED   SPACE OAR INSTILLATION N/A 03/09/2020   Procedure: SPACE OAR INSTILLATION;  Surgeon: Bjorn Pippin, MD;  Location: [facility];  Service: Urology;  Laterality: N/A;   VENTRAL HERNIA REPAIR  09/ 2000 and recurrent repair 06/ 2001    Social History   Socioeconomic History   Marital status: Married    Spouse name: Bonita Quin    Number of children: 3   Years of education: Not on file   Highest education level: Not on file  Occupational History   Occupation: part-time trucker    Comment: retired   Tobacco Use   Smoking status: Former    Current packs/day: 0.00    Average packs/day: 4.0 packs/day for 45.0 years (180.0 ttl pk-yrs)    Types: Cigarettes    Start date: 01/14/1952    Quit date: 01/13/1997    Years since quitting: 26.1   Smokeless tobacco: Never   Tobacco comments:    reports he began smoking at age 17  Vaping Use   Vaping status: Never Used  Substance and Sexual Activity   Alcohol use: No   Drug use: Never   Sexual activity: Not on file  Other Topics  Concern   Not on file  Social History Narrative   Lives home with wife   Social Drivers of Health   Financial Resource Strain: Low Risk  (12/24/2022)   Overall Financial Resource Strain (CARDIA)    Difficulty of Paying Living Expenses: Not hard at all  Food Insecurity: No Food Insecurity (12/24/2022)   Hunger Vital Sign    Worried About Running Out of Food in the Last Year: Never true    Ran Out of Food in the Last Year: Never true  Transportation Needs: No Transportation Needs (12/24/2022)   PRAPARE - Administrator, Civil Service (Medical): No    Lack of Transportation (Non-Medical): No  Physical Activity: Sufficiently Active (12/24/2022)   Exercise Vital Sign    Days of Exercise per Week: 5 days    Minutes of Exercise per Session: 30 min  Stress: No Stress Concern Present (12/24/2022)   [employer]  of Occupational Health - Occupational Stress Questionnaire    Feeling of Stress : Not at all  Social Connections: Socially Integrated (12/24/2022)   Social Connection and Isolation Panel [NHANES]    Frequency of Communication with Friends and Family: More than three times a week    Frequency of Social Gatherings with Friends and Family: Three times a week    Attends Religious Services: More than 4 times per year    Active Member of Clubs or Organizations: Yes    Attends Banker Meetings: More than 4 times per year    Marital Status: Married  Catering manager Violence: Not At Risk (12/24/2022)   Humiliation, Afraid, Rape, and Kick questionnaire    Fear of Current or Ex-Partner: No    Emotionally Abused: No    Physically Abused: No    Sexually Abused: No    Family History  Problem Relation Age of Onset   Lung cancer Mother    Dementia Father    Hyperlipidemia Father    Congestive Heart Failure Father    Post-traumatic stress disorder Son    Alcohol abuse Maternal Grandfather    Cancer Maternal Grandfather 90       stomach cancer    Heart attack Neg Hx    Stroke Neg Hx    Breast cancer Neg Hx    Colon cancer Neg Hx    Prostate cancer Neg Hx    Pancreatic cancer Neg Hx        Objective: Vitals:   02/12/23 1017  BP: 120/62  Pulse: 76       Physical Exam Vitals reviewed.  Constitutional:      Appearance: Normal appearance.  Neurological:     Mental Status: He is alert.     Lab Results:  Lab Results  Component Value Date   PSA1 0.4 02/05/2023   PSA1 0.4 07/31/2022   PSA1 0.6 02/03/2022       BMET No results for input(s): "NA", "K", "CL", "CO2", "GLUCOSE", "BUN", "CREATININE", "CALCIUM" in the last 72 hours. PSA PSA  Date Value Ref Range Status  03/30/2019 10.4 (H) < OR = 4.0 ng/mL Final    Comment:    The total PSA value from this assay system is  standardized against the WHO standard. The test  result will be approximately 20% lower  when compared  to the equimolar-standardized total PSA (Beckman  Coulter). Comparison of serial PSA results should be  interpreted with this fact in mind. . This test was performed using the Siemens  chemiluminescent method. Values  obtained from  different assay methods cannot be used interchangeably. PSA levels, regardless of value, should not be interpreted as absolute evidence of the presence or absence of disease.   11/18/2013 5.0 (H) 0.0 - 4.0 ng/mL Final    Comment:    Roche ECLIA methodology. According to the American Urological Association, Serum PSA should decrease and remain at undetectable levels after radical prostatectomy. The AUA defines biochemical recurrence as an initial PSA value 0.2 ng/mL or greater followed by a subsequent confirmatory PSA value 0.2 ng/mL or greater. Values obtained with different assay methods or kits cannot be used interchangeably. Results cannot be interpreted as absolute evidence of the presence or absence of malignant disease.   05/20/2013 5.3 (H) 0.0 - 4.0 ng/mL Final    Comment:    Roche ECLIA methodology. According to the American Urological Association, Serum PSA should decrease and remain at undetectable levels after radical prostatectomy. The AUA defines biochemical recurrence as an initial PSA value 0.2 ng/mL or greater followed by a subsequent confirmatory PSA value 0.2 ng/mL or greater. Values obtained with different assay methods or kits cannot be used interchangeably. Results cannot be interpreted as absolute evidence of the presence or absence of malignant disease.   No results found for: "TESTOSTERONE"  Lab Results  Component Value Date   PSA1 0.4 02/05/2023   PSA1 0.4 07/31/2022   PSA1 0.6 02/03/2022   Recent Results (from the past 2160 hours)  CUP PACEART REMOTE DEVICE CHECK     Status: None   Collection Time: 12/12/22 11:02 PM  Result Value Ref Range   Date Time Interrogation Session [phone]    Pulse  Generator Manufacturer MERM    Pulse Gen Model LNQ22 LINQ II    Pulse Gen Serial Number [id number]    Clinic Name [facility]    Implantable Pulse Generator Type ICM/ILR    Implantable Pulse Generator Implant Date [id number]   CUP PACEART REMOTE DEVICE CHECK     Status: None   Collection Time: 01/16/23 11:04 PM  Result Value Ref Range   Date Time Interrogation Session [id number]    Pulse Generator Manufacturer MERM    Pulse Gen Model LNQ22 LINQ II    Pulse Gen Serial Number [id number]    [facility]inic [facility]    Implantable Pulse Generator Type ICM/ILR    Implantable Pulse Generator Implant Date [id number]   Bayer DCA Hb A1c Waived     Status: None   Collection Time: 01/20/23  8:09 AM  Result Value Ref Range   HB A1C (BAYER DCA - WAIVED) 5.2 4.8 - 5.6 %    Comment:          Prediabetes: 5.7 - 6.4          Diabetes: >6.4          Glycemic control for adults with diabetes: <7.0   CBC with Differential/Platelet     Status: None   Collection Time: 01/20/23  8:14 AM  Result Value Ref Range   WBC 6.5 3.4 - 10.8 x10E3/uL   RBC 4.62 4.14 - 5.80 x10E6/uL   Hemoglobin 13.0 13.0 - 17.7 g/dL   Hematocrit 25.3 66.4 - 51.0 %   MCV 85 79 - 97 fL   MCH 28.1 26.6 - 33.0 pg   MCHC 33.1 31.5 - 35.7 g/dL   RDW 40.3 47.4 - 25.9 %   Platelets 178 150 - 450 x10E3/uL   Neutrophils 71 Not Estab. %   Lymphs 15 Not Estab. %  Monocytes 7 Not Estab. %   Eos 5 Not Estab. %   Basos 1 Not Estab. %   Neutrophils Absolute 4.6 1.4 - 7.0 x10E3/uL   Lymphocytes Absolute 1.0 0.7 - 3.1 x10E3/uL   Monocytes Absolute 0.5 0.1 - 0.9 x10E3/uL   EOS (ABSOLUTE) 0.3 0.0 - 0.4 x10E3/uL   Basophils Absolute 0.1 0.0 - 0.2 x10E3/uL   Immature Granulocytes 1 Not Estab. %   Immature Grans (Abs) 0.0 0.0 - 0.1 x10E3/uL  CMP14+EGFR     Status: Abnormal   Collection Time: 01/20/23  8:14 AM  Result Value Ref Range   Glucose 97 70 - 99 mg/dL   BUN 24 8 - 27 mg/dL   Creatinine, Ser 1.61 (H) 0.76 - 1.27 mg/dL    eGFR 48 (L) >09 UE/AVW/0.98   BUN/Creatinine Ratio 16 10 - 24   Sodium 140 134 - 144 mmol/L   Potassium 4.5 3.5 - 5.2 mmol/L   Chloride 102 96 - 106 mmol/L   CO2 24 20 - 29 mmol/L   Calcium 9.4 8.6 - 10.2 mg/dL   Total Protein 6.5 6.0 - 8.5 g/dL   Albumin 4.2 3.8 - 4.8 g/dL   Globulin, Total 2.3 1.5 - 4.5 g/dL   Bilirubin Total 0.4 0.0 - 1.2 mg/dL   Alkaline Phosphatase 93 44 - 121 IU/L   AST 11 0 - 40 IU/L   ALT 13 0 - 44 IU/L  Lipid panel     Status: Abnormal   Collection Time: 01/20/23  8:14 AM  Result Value Ref Range   Cholesterol, Total 115 100 - 199 mg/dL   Triglycerides 119 0 - 149 mg/dL   HDL 31 (L) >14 mg/dL   VLDL Cholesterol Cal 19 5 - 40 mg/dL   LDL Chol Calc (NIH) 65 0 - 99 mg/dL   Chol/HDL Ratio 3.7 0.0 - 5.0 ratio    Comment:                                   T. Chol/HDL Ratio                                             Men  Women                               1/2 Avg.Risk  3.4    3.3                                   Avg.Risk  5.0    4.4                                2X Avg.Risk  9.6    7.1                                3X Avg.Risk 23.4   11.0   PSA     Status: None   Collection Time: 02/05/23 12:51 PM  Result Value Ref Range   Prostate Specific Ag, Serum 0.4 0.0 - 4.0 ng/mL    Comment:  Roche ECLIA methodology. According to the American Urological Association, Serum PSA should decrease and remain at undetectable levels after radical prostatectomy. The AUA defines biochemical recurrence as an initial PSA value 0.2 ng/mL or greater followed by a subsequent confirmatory PSA value 0.2 ng/mL or greater. Values obtained with different assay methods or kits cannot be used interchangeably. Results cannot be interpreted as absolute evidence of the presence or absence of malignant disease.   Urinalysis, Routine w reflex microscopic     Status: Abnormal   Collection Time: 02/12/23 10:16 AM  Result Value Ref Range   Specific Gravity, UA 1.030 1.005 - 1.030   pH,  UA 5.5 5.0 - 7.5   Color, UA Yellow Yellow   Appearance Ur Clear Clear   Leukocytes,UA Negative Negative   Protein,UA 1+ (A) Negative/Trace   Glucose, UA Negative Negative   Ketones, UA Trace (A) Negative   RBC, UA Negative Negative   Bilirubin, UA Negative Negative   Urobilinogen, Ur 2.0 (H) 0.2 - 1.0 mg/dL   Nitrite, UA Negative Negative   Microscopic Examination See below:     Comment: Microscopic was indicated and was performed.  Microscopic Examination     Status: Abnormal   Collection Time: 02/12/23 10:16 AM   Urine  Result Value Ref Range   WBC, UA 0-5 0 - 5 /hpf   RBC, Urine 0-2 0 - 2 /hpf   Epithelial Cells (non renal) 0-10 0 - 10 /hpf   Casts Present (A) None seen /lpf   Cast Type Granular casts (A) N/A   Bacteria, UA None seen None seen/Few     UA is clear.   Studies/Results:     Assessment & Plan: T2a Nx Mx Gleason 7(3+4) disease s/p seed implant.   He is doing well post op with a stable low PSA.  He with return in 6 months with a PSA.   Microhematuria.  Resolved.  Nodular prostate with LUTS.   He has moderate LUTS with minimal bother.   Folliculitis in the right groin.   The lesion has drained and he is doing well.  He will let me know if it returns.   No orders of the defined types were placed in this encounter.     Orders Placed This Encounter  Procedures   Microscopic Examination   Urinalysis, Routine w reflex microscopic   PSA    Standing Status:   Future    Expected Date:   08/12/2023    Expiration Date:   02/12/2024      Return in about 6 months (around 08/12/2023) for with PSA. .   CC: Bennie Pierini, FNP      Bjorn Pippin 02/13/2023 Patient ID: Jacob Rios, male   DOB: [birth year], 78 y.o.   MRN: [id number]

## 2023-02-23 ENCOUNTER — Ambulatory Visit (INDEPENDENT_AMBULATORY_CARE_PROVIDER_SITE_OTHER): Payer: Medicare PPO

## 2023-02-23 DIAGNOSIS — I639 Cerebral infarction, unspecified: Secondary | ICD-10-CM | POA: Diagnosis not present

## 2023-02-23 LAB — CUP PACEART REMOTE DEVICE CHECK
Date Time Interrogation Session: 20250207230426
Implantable Pulse Generator Implant Date: 20220818

## 2023-02-26 ENCOUNTER — Encounter: Payer: Self-pay | Admitting: Cardiology

## 2023-02-26 NOTE — Progress Notes (Signed)
[facility] Summary Report / Loop Recorder

## 2023-03-16 NOTE — Progress Notes (Unsigned)
 Cardiology Office Note:  .   Date:  03/17/2023  ID:  Jacob Rios, DOB [birth year], MRN [id number] PCP: Bennie Pierini, FNP  [facility] Providers Cardiologist:  Verne Carrow, MD {  History of Present Illness: .   Jacob Rios is a 78 y.o. male with a past medical history of CAD status post CABG in 2000, last cath 2005 with occluded LIMA to LAD, native LAD open, patent SVG to diagonal and patent RIMA to RCA, normal stress test Myoview 2014, HTN, HLD, DM and former tobacco abuse, AAA, CVA 08/2020 with normal LV EF 50 to 55%, moderate LVH, trivial MR, mild AS, loop recorder placed and no A-fib here for follow-up appointment.  Was seen by Wynell Balloon, PA-C 03/11/2022 and was doing well at that time.  Works outdoors a lot without any chest pain or dyspnea.  No dizziness or cardiac complaints.  Today, he presents with a history of hypertension, hyperlipidemia, diabetes, and aortic aneurysm for a routine follow-up. He reports no issues with his current medications and denies any side effects. He has been monitoring his blood pressure at home, which has been consistently good.  The patient denies any symptoms of palpitations or irregular heartbeats. He has a loop recorder in place, which is monitored regularly, and recent reports have shown no arrhythmias.  The patient's diabetes is well-controlled, with frequent home glucose monitoring. He has been considering getting an implantable glucose monitor to avoid frequent finger pricks.  He also had a carotid ultrasound last year, which showed mild disease.  Reports no shortness of breath nor dyspnea on exertion. Reports no chest pain, pressure, or tightness. No edema, orthopnea, PND. Reports no palpitations.   Discussed the use of AI scribe software for clinical note transcription with the patient, who gave verbal consent to proceed.  ROS: Pertinent ROS in HPI  Studies Reviewed: .        Echo 08/28/20:  1. Left ventricular  ejection fraction, by estimation, is 50 to 55%. The  left ventricle has low normal function. The left ventricle has no regional  wall motion abnormalities. There is moderate left ventricular hypertrophy.  Left ventricular diastolic  parameters were normal.   2. Right ventricular systolic function is normal. The right ventricular  size is normal.   3. Left atrial size was mildly dilated.   4. The mitral valve is normal in structure. Trivial mitral valve  regurgitation. No evidence of mitral stenosis.   5. Very mild AS with AVA 1.87 and mean gradient only 6 peak 11 mmhg no  obvious sub aortic web . The aortic valve is tricuspid. There is moderate  calcification of the aortic valve. There is moderate thickening of the  aortic valve. Aortic valve  regurgitation is not visualized. Mild aortic valve stenosis.   6. Aortic dilatation noted. There is mild dilatation of the aortic root,  measuring 39 mm.   7. The inferior vena cava is normal in size with greater than 50%  respiratory variability, suggesting right atrial pressure of 3 mmHg.        Physical Exam:   VS:  BP (!) 110/50   Pulse 77   Ht 5\' 11"  (1.803 m)   Wt 213 lb 9.6 oz (96.9 kg)   SpO2 96%   BMI 29.79 kg/m    Wt Readings from Last 3 Encounters:  03/17/23 213 lb 9.6 oz (96.9 kg)  12/24/22 222 lb (100.7 kg)  10/08/22 222 lb 12.8 oz (101.1 kg)  GEN: Well nourished, well developed in no acute distress NECK: No JVD; No carotid bruits CARDIAC: RRR, no murmurs, rubs, gallops RESPIRATORY:  Clear to auscultation without rales, wheezing or rhonchi  ABDOMEN: Soft, non-tender, non-distended EXTREMITIES:  No edema; No deformity   ASSESSMENT AND PLAN: .    Premature Ventricular Contractions (PVCs) Frequent PVCs identified on EKG. Beta blocker limits further dosage increase due to blood pressure. Loop recorder showed no arrhythmias. - Notify Dr. Lalla Brothers about frequent PVCs on EKG. - Hold off on additional heart monitoring; loop  recorder in place.  Aortic Aneurysm Surveillance Annual CT scans scheduled for aortic monitoring. - Continue annual CT scans for aortic aneurysm surveillance.  Carotid Artery Disease Previous scan showed very mild disease bilaterally. No bruits detected. - Hold off on repeating carotid ultrasound unless new symptoms arise.  Hyperlipidemia Lipid profile well-controlled with current Lipitor regimen. - Continue current Lipitor 40 mg regimen.  Diabetes Mellitus Blood glucose well-controlled. Interested in implantable glucose monitor. - Discuss implantable glucose monitor with primary care provider.  CVA -no symptoms -continue plavix  PAD -no rest pain and no claudication      Dispo: He can follow-up in a year  Signed, Sharlene Dory, PA-C

## 2023-03-17 ENCOUNTER — Ambulatory Visit: Payer: Medicare PPO | Attending: Physician Assistant | Admitting: Physician Assistant

## 2023-03-17 ENCOUNTER — Encounter: Payer: Self-pay | Admitting: Physician Assistant

## 2023-03-17 VITALS — BP 110/50 | HR 77 | Ht 71.0 in | Wt 213.6 lb

## 2023-03-17 DIAGNOSIS — E782 Mixed hyperlipidemia: Secondary | ICD-10-CM | POA: Diagnosis not present

## 2023-03-17 DIAGNOSIS — I10 Essential (primary) hypertension: Secondary | ICD-10-CM | POA: Diagnosis not present

## 2023-03-17 DIAGNOSIS — I25810 Atherosclerosis of coronary artery bypass graft(s) without angina pectoris: Secondary | ICD-10-CM | POA: Diagnosis not present

## 2023-03-17 DIAGNOSIS — E785 Hyperlipidemia, unspecified: Secondary | ICD-10-CM

## 2023-03-17 DIAGNOSIS — R0989 Other specified symptoms and signs involving the circulatory and respiratory systems: Secondary | ICD-10-CM

## 2023-03-17 DIAGNOSIS — E0800 Diabetes mellitus due to underlying condition with hyperosmolarity without nonketotic hyperglycemic-hyperosmolar coma (NKHHC): Secondary | ICD-10-CM

## 2023-03-17 DIAGNOSIS — I739 Peripheral vascular disease, unspecified: Secondary | ICD-10-CM

## 2023-03-17 DIAGNOSIS — I714 Abdominal aortic aneurysm, without rupture, unspecified: Secondary | ICD-10-CM

## 2023-03-17 DIAGNOSIS — I639 Cerebral infarction, unspecified: Secondary | ICD-10-CM

## 2023-03-17 DIAGNOSIS — I2510 Atherosclerotic heart disease of native coronary artery without angina pectoris: Secondary | ICD-10-CM

## 2023-03-17 DIAGNOSIS — E1169 Type 2 diabetes mellitus with other specified complication: Secondary | ICD-10-CM

## 2023-03-17 MED ORDER — CLOPIDOGREL BISULFATE 75 MG PO TABS: 75.0000 mg | ORAL_TABLET | Freq: Every day | ORAL | 3 refills | Status: DC

## 2023-03-17 MED ORDER — ATORVASTATIN CALCIUM 40 MG PO TABS: 40.0000 mg | ORAL_TABLET | Freq: Every day | ORAL | 3 refills | Status: DC

## 2023-03-17 MED ORDER — LISINOPRIL 2.5 MG PO TABS: 2.5000 mg | ORAL_TABLET | Freq: Every day | ORAL | 3 refills | Status: DC

## 2023-03-17 MED ORDER — METOPROLOL SUCCINATE ER 50 MG PO TB24: ORAL_TABLET | ORAL | 3 refills | Status: DC

## 2023-03-17 NOTE — Patient Instructions (Signed)
 Medication Instructions:  Your physician recommends that you continue on your current medications as directed. Please refer to the Current Medication list given to you today.  *If you need a refill on your cardiac medications before your next appointment, please call your pharmacy*   Lab Work: None ordered  If you have labs (blood work) drawn today and your tests are completely normal, you will receive your results only by: MyChart Message (if you have MyChart) OR A paper copy in the mail If you have any lab test that is abnormal or we need to change your treatment, we will call you to review the results.   Testing/Procedures: None ordered   Follow-Up: At [facility], you and your health needs are our priority.  As part of our continuing mission to provide you with exceptional heart care, we have created designated Provider Care Teams.  These Care Teams include your primary Cardiologist (physician) and Advanced Practice Providers (APPs -  Physician Assistants and Nurse Practitioners) who all work together to provide you with the care you need, when you need it.  We recommend signing up for the patient portal called "MyChart".  Sign up information is provided on this After Visit Summary.  MyChart is used to connect with patients for Virtual Visits (Telemedicine).  Patients are able to view lab/test results, encounter notes, upcoming appointments, etc.  Non-urgent messages can be sent to your provider as well.   To learn more about what you can do with MyChart, go to [email or web address].    Your next appointment:   12 month(s)  Provider:   Verne Carrow, MD     Other Instructions      1st Floor: - Lobby - Registration  - Pharmacy  - Lab - Cafe  2nd Floor: - PV Lab - Diagnostic Testing (echo, CT, nuclear med)  3rd Floor: - Vacant  4th Floor: - TCTS (cardiothoracic surgery) - AFib Clinic - Structural Heart Clinic - Vascular Surgery  - Vascular  Ultrasound  5th Floor: - [facility] (general and EP) - Clinical Pharmacy for coumadin, hypertension, lipid, weight-loss medications, and med management appointments    Valet parking services will be available as well.

## 2023-03-30 ENCOUNTER — Ambulatory Visit: Payer: Medicare PPO

## 2023-03-30 DIAGNOSIS — I639 Cerebral infarction, unspecified: Secondary | ICD-10-CM

## 2023-03-30 NOTE — Progress Notes (Signed)
 Carelink Summary Report / Loop Recorder

## 2023-03-31 LAB — CUP PACEART REMOTE DEVICE CHECK
Date Time Interrogation Session: 20250316230651
Implantable Pulse Generator Implant Date: 20220818

## 2023-04-05 ENCOUNTER — Encounter: Payer: Self-pay | Admitting: Cardiology

## 2023-05-04 ENCOUNTER — Ambulatory Visit (INDEPENDENT_AMBULATORY_CARE_PROVIDER_SITE_OTHER): Payer: Medicare PPO

## 2023-05-04 DIAGNOSIS — I639 Cerebral infarction, unspecified: Secondary | ICD-10-CM

## 2023-05-05 LAB — CUP PACEART REMOTE DEVICE CHECK
Date Time Interrogation Session: 20250420230804
Implantable Pulse Generator Implant Date: 20220818

## 2023-05-07 ENCOUNTER — Ambulatory Visit (INDEPENDENT_AMBULATORY_CARE_PROVIDER_SITE_OTHER)

## 2023-05-07 DIAGNOSIS — Z23 Encounter for immunization: Secondary | ICD-10-CM

## 2023-05-07 NOTE — Progress Notes (Signed)
 Patient is in office today for a nurse visit for Tetanus Immunization. Patient Injection was given in the  Left deltoid. Patient tolerated injection well.

## 2023-05-10 ENCOUNTER — Encounter: Payer: Self-pay | Admitting: Cardiology

## 2023-05-14 NOTE — Addendum Note (Signed)
 Addended by: Edra Govern D on: 05/14/2023 03:41 PM   Modules accepted: Orders

## 2023-05-14 NOTE — Progress Notes (Signed)
 Carelink Summary Report / Loop Recorder

## 2023-05-15 ENCOUNTER — Ambulatory Visit: Payer: Self-pay

## 2023-05-15 ENCOUNTER — Ambulatory Visit: Admitting: Family Medicine

## 2023-05-15 ENCOUNTER — Encounter: Payer: Self-pay | Admitting: Family Medicine

## 2023-05-15 VITALS — BP 117/51 | HR 78 | Temp 98.2°F | Ht 71.0 in | Wt 206.0 lb

## 2023-05-15 DIAGNOSIS — S41101D Unspecified open wound of right upper arm, subsequent encounter: Secondary | ICD-10-CM | POA: Diagnosis not present

## 2023-05-15 MED ORDER — DOXYCYCLINE HYCLATE 100 MG PO TABS: 100.0000 mg | ORAL_TABLET | Freq: Two times a day (BID) | ORAL | 0 refills | Status: AC

## 2023-05-15 NOTE — Telephone Encounter (Signed)
 Copied from CRM [id number]. Topic: Clinical - Red Word Triage >> May 15, 2023  7:56 AM Arlie Benedict B wrote: [facility] that prompted transfer to Nurse Triage: patient has a sore on his right arm the area is draining, swollen, and hurting. >> May 15, 2023  8:02 AM Suzette B wrote: Call dropped in the process of me trying to transfer over to triage nurse. Attempted to call patient's spouse, Ms. Hapner back but did not get an answer     Chief Complaint: Skin wound right lower arm,has been seen for this in the past. Looks infected per wife. Red, draining, painful. Symptoms: Above Frequency: weeks Pertinent Negatives: Patient denies fever Disposition: [] ED /[] Urgent Care (no appt availability in office) / [x] Appointment(In office/virtual)/ []  [facility]/ [] Home Care/ [] Refused Recommended Disposition /[] [facility]/ []  Follow-up with PCP Additional Notes: Agrees with appointment.  Reason for Disposition  [1] Looks infected (spreading redness, pus) AND [2] no fever  Answer Assessment - Initial Assessment Questions 1. APPEARANCE of INJURY: "What does the injury look like?"      Red 2. SIZE: "How large is the cut?"      Nickel 3. BLEEDING: "Is it bleeding now?" If Yes, ask: "Is it difficult to stop?"      Oozing 4. LOCATION: "Where is the injury located?"      Right lower 5. ONSET: "How long ago did the injury occur?"      Thorn 6. MECHANISM: "Tell me how it happened."      Thorn 7. TETANUS: "When was the last tetanus booster?"     unsure 8. PREGNANCY: "Is there any chance you are pregnant?" "When was your last menstrual period?"     N/a  Protocols used: Skin Injury-A-AH

## 2023-05-15 NOTE — Progress Notes (Signed)
 Subjective:  Patient ID: Jacob Rios, male    DOB: [birth year], 78 y.o.   MRN: [id number]  Patient Care Team: Delfina Feller, FNP as PCP - General (Family Medicine) Odie Benne, MD as PCP - Cardiology (Cardiology) Homero Luster, MD as Attending Physician (Urology) Hyland Mailman, MD as Referring Physician (Optometry) Judyth Nunnery, RN as Oncology Nurse Navigator Ronelle Coffee, MD as Consulting Physician (Ophthalmology)   Chief Complaint:  Wound Check  HPI: Jacob Rios is a 78 y.o. male presenting on 05/15/2023 for Wound Check  HPI States that he had a thorn stuck in it after mowing the grass months ago. Followed at urgent care, received xray and was told there was a possible foreign body. Followed up at [facility] and received second xray that revealed the same. He believed that he was referred to General surgery but has not heard anything from them. States that it looks like it will improve and then worsen. Reports that it has blood and pus coming out of it. Using betadine, alcohol and vitamin E oil on it. Previously used abx and it almost healed then returned. Denies N/V, fever   Relevant past medical, surgical, family, and social history reviewed and updated as indicated.  Allergies and medications reviewed and updated. Data reviewed: Chart in Epic.   Past Medical History:  Diagnosis Date   Cataract    CKD (chronic kidney disease), stage III (HCC)    followed by pcp   Coronary artery disease cardiologist--- dr Abel Hoe   08/ 1999  s/p  cath w/ PTCA and stenting to RCA;   05/ 2000 inferior wall MI , 06-06-1998 s/p cabg x3;   Last heart cath 2005,  2 patent grafts (diagnol and RCA) and occluded LIMA--LAD graft with normal LAD nonobstructive disease;  last nuclear study 11/ 2014 no evidence ishcemia, ef 40%   Full dentures    Hiatal hernia    History of acute inferior wall MI 05/1998   s/p  cabg   Hypertension    Hypertensive retinopathy    Mixed hyperlipidemia     Nocturia more than twice per night    PAD (peripheral artery disease) (HCC)    left common iliac artery stenosis per aorta ultrasound 03/ 2021 in epic and cardiology note   Prostate cancer [facility]) urologist--- dr Inga Manges   first dx 07/ 2015 in active survillance until bx 11-11-2019,  Stage T2a, Gleason 3+4, PSA 13.3   S/P CABG x 3 06/06/1998   LIMA--LAD, SVG to Diagonal, RIMA to RCA   S/P primary angioplasty with coronary stent 08/1997   stent to RCA   Type 2 diabetes mellitus (HCC)    followed by pcp  (03-06-2020 checks blood sugar dialy in am,  fasting sugar-- 110-130)    Past Surgical History:  Procedure Laterality Date   APPENDECTOMY  child   CARDIAC CATHETERIZATION  09/01/2000  @[facility]    patent RCA and Diagonal grafts, atretic LIMA, moderate nonobstructive proxLAD, normal lvsf   CARDIAC CATHETERIZATION  04-21-2003  @[facility]    LIMA--LAD graft occluded, native LAD with nonobstructive disease,  patent diagonal/ rca grafts   CATARACT EXTRACTION     CATARACT EXTRACTION W/ INTRAOCULAR LENS IMPLANT Right 12/2018   CORONARY ANGIOPLASTY WITH STENT PLACEMENT  08/1997  @[facility]    ptca w/ stenting to rca   CORONARY ARTERY BYPASS GRAFT  06-06-1998  @[facility]     LIMA -- LAD, SVG -- Diagonal,  RIMA to RCA   CYSTOSCOPY N/A 03/09/2020  Procedure: CYSTOSCOPY FLEXIBLE;  Surgeon: Homero Luster, MD;  Location: [facility];  Service: Urology;  Laterality: N/A;  NO SEEDS FOUND IN BLADDER   EYE SURGERY     INGUINAL HERNIA REPAIR Right 1974   LOOP RECORDER INSERTION N/A 08/30/2020   Procedure: LOOP RECORDER INSERTION;  Surgeon: Boyce Byes, MD;  Location: [facility] INVASIVE CV LAB;  Service: Cardiovascular;  Laterality: N/A;   PROSTATE BIOPSY  06/2017   x 4-5    RADIOACTIVE SEED IMPLANT N/A 03/09/2020   Procedure: RADIOACTIVE SEED IMPLANT/BRACHYTHERAPY IMPLANT;  Surgeon: Homero Luster, MD;  Location: [facility];  Service: Urology;  Laterality: N/A;   70  SEEDS IMPLANTED   SPACE OAR  INSTILLATION N/A 03/09/2020   Procedure: SPACE OAR INSTILLATION;  Surgeon: Homero Luster, MD;  Location: [facility];  Service: Urology;  Laterality: N/A;   VENTRAL HERNIA REPAIR  09/ 2000 and recurrent repair 06/ 2001    Social History   Socioeconomic History   Marital status: Married    Spouse name: Jacob Rios    Number of children: 3   Years of education: Not on file   Highest education level: Not on file  Occupational History   Occupation: part-time trucker    Comment: retired   Tobacco Use   Smoking status: Former    Current packs/day: 0.00    Average packs/day: 4.0 packs/day for 45.0 years (180.0 ttl pk-yrs)    Types: Cigarettes    Start date: 01/14/1952    Quit date: 01/13/1997    Years since quitting: 26.3   Smokeless tobacco: Never   Tobacco comments:    reports he began smoking at age 49  Vaping Use   Vaping status: Never Used  Substance and Sexual Activity   Alcohol use: No   Drug use: Never   Sexual activity: Not on file  Other Topics Concern   Not on file  Social History Narrative   Lives home with wife   Social Drivers of Health   Financial Resource Strain: Low Risk  (12/24/2022)   Overall Financial Resource Strain (CARDIA)    Difficulty of Paying Living Expenses: Not hard at all  Food Insecurity: No Food Insecurity (12/24/2022)   Hunger Vital Sign    Worried About Running Out of Food in the Last Year: Never true    Ran Out of Food in the Last Year: Never true  Transportation Needs: No Transportation Needs (12/24/2022)   PRAPARE - Administrator, Civil Service (Medical): No    Lack of Transportation (Non-Medical): No  Physical Activity: Sufficiently Active (12/24/2022)   Exercise Vital Sign    Days of Exercise per Week: 5 days    Minutes of Exercise per Session: 30 min  Stress: No Stress Concern Present (12/24/2022)   [employer] of Occupational Health - Occupational Stress Questionnaire    Feeling of Stress : Not at all   Social Connections: Socially Integrated (12/24/2022)   Social Connection and Isolation Panel [NHANES]    Frequency of Communication with Friends and Family: More than three times a week    Frequency of Social Gatherings with Friends and Family: Three times a week    Attends Religious Services: More than 4 times per year    Active Member of Clubs or Organizations: Yes    Attends Banker Meetings: More than 4 times per year    Marital Status: Married  Catering manager Violence: Not At Risk (12/24/2022)   Humiliation, Afraid,  Rape, and Kick questionnaire    Fear of Current or Ex-Partner: No    Emotionally Abused: No    Physically Abused: No    Sexually Abused: No    Outpatient Encounter Medications as of 05/15/2023  Medication Sig   atorvastatin  (LIPITOR) 40 MG tablet Take 1 tablet (40 mg total) by mouth daily.   Cholecalciferol (D3 ADULT PO) Take 1 tablet by mouth daily.   clopidogrel  (PLAVIX ) 75 MG tablet Take 1 tablet (75 mg total) by mouth daily.   glipiZIDE  (GLUCOTROL  XL) 10 MG 24 hr tablet Take 1 tablet (10 mg total) by mouth daily.   glucose blood (ONETOUCH VERIO) test strip Test 1X per day and as needed  Dx 250.02   Lancets (ONETOUCH ULTRASOFT) lancets Patient test 1X per day and prn  Dx 250.02   lisinopril  (ZESTRIL ) 2.5 MG tablet Take 1 tablet (2.5 mg total) by mouth daily.   metFORMIN  (GLUCOPHAGE ) 1000 MG tablet Take 1 tablet (1,000 mg total) by mouth 2 (two) times daily with a meal.   metoprolol  succinate (TOPROL -XL) 50 MG 24 hr tablet TAKE 1 TABLET BY MOUTH ONCE DAILY WITH OR IMMEDIATELY FOLLOWING A MEAL   omeprazole  (PRILOSEC) 40 MG capsule Take 1 capsule (40 mg total) by mouth daily.   vitamin C (ASCORBIC ACID) 500 MG tablet Take 500 mg by mouth daily.   No facility-administered encounter medications on file as of 05/15/2023.    Allergies  Allergen Reactions   Morphine Shortness Of Breath and Swelling    Review of Systems As per HPI  Objective:  BP (!)  117/51   Pulse 78   Temp 98.2 F (36.8 C)   Ht 5\' 11"  (1.803 m)   Wt 206 lb (93.4 kg)   SpO2 95%   BMI 28.73 kg/m    Wt Readings from Last 3 Encounters:  05/15/23 206 lb (93.4 kg)  03/17/23 213 lb 9.6 oz (96.9 kg)  12/24/22 222 lb (100.7 kg)    Physical Exam Constitutional:      General: He is awake. He is not in acute distress.    Appearance: Normal appearance. He is well-developed and well-groomed. He is not ill-appearing, toxic-appearing or diaphoretic.  Cardiovascular:     Rate and Rhythm: Normal rate and regular rhythm.     Pulses: Normal pulses.          Radial pulses are 2+ on the right side and 2+ on the left side.       Posterior tibial pulses are 2+ on the right side and 2+ on the left side.     Heart sounds: Normal heart sounds. No murmur heard.    No gallop.  Pulmonary:     Effort: Pulmonary effort is normal. No respiratory distress.     Breath sounds: Normal breath sounds. No stridor. No wheezing, rhonchi or rales.  Musculoskeletal:     Cervical back: Full passive range of motion without pain and neck supple.     Right lower leg: No edema.     Left lower leg: No edema.  Skin:    General: Skin is warm.     Capillary Refill: Capillary refill takes less than 2 seconds.          Comments: Open, circular abrasion with surrounding area of edema   Neurological:     General: No focal deficit present.     Mental Status: He is alert, oriented to person, place, and time and easily aroused. Mental status is at baseline.  GCS: GCS eye subscore is 4. GCS verbal subscore is 5. GCS motor subscore is 6.     Motor: No weakness.  Psychiatric:        Attention and Perception: Attention and perception normal.        Mood and Affect: Mood and affect normal.        Speech: Speech normal.        Behavior: Behavior normal. Behavior is cooperative.        Thought Content: Thought content normal. Thought content does not include homicidal or suicidal ideation. Thought content  does not include homicidal or suicidal plan.        Cognition and Memory: Cognition and memory normal.        Judgment: Judgment normal.      Results for orders placed or performed in visit on 05/04/23  CUP PACEART REMOTE DEVICE CHECK   Collection Time: 05/03/23 11:08 PM  Result Value Ref Range   Date Time Interrogation Session [id number]    Pulse Generator Manufacturer MERM    Pulse Gen Model LNQ22 LINQ II    Pulse Gen Serial Number [id number]    Clinic Name [facility]    Implantable Pulse Generator Type ICM/ILR    Implantable Pulse Generator Implant Date [id number]        12/24/2022    1:42 PM 10/08/2022   11:53 AM 09/29/2022   11:39 AM 07/18/2022   10:37 AM 02/03/2022    8:06 AM  Depression screen PHQ 2/9  Decreased Interest 0 0 0 0 0  Down, Depressed, Hopeless 0 0 0 0 0  PHQ - 2 Score 0 0 0 0 0  Altered sleeping  0 0 0 0  Tired, decreased energy  0 0 0 0  Change in appetite  0 0 0 0  Feeling bad or failure about yourself   0 0 0 0  Trouble concentrating  0 0 0 0  Moving slowly or fidgety/restless  0 0 0 0  Suicidal thoughts  0 0 0 0  PHQ-9 Score  0 0 0 0  Difficult doing work/chores  Not difficult at all Not difficult at all Not difficult at all Not difficult at all       10/08/2022   11:53 AM 09/29/2022   11:39 AM 07/18/2022   10:37 AM 02/03/2022    8:06 AM  GAD 7 : Generalized Anxiety Score  Nervous, Anxious, on Edge 0 0 0 0  Control/stop worrying 0 0 0 0  Worry too much - different things 0 0 0 0  Trouble relaxing 0 0 0 0  Restless 0 0 0 0  Easily annoyed or irritable 0 0 0 0  Afraid - awful might happen 0 0 0 0  Total GAD 7 Score 0 0 0 0  Anxiety Difficulty Not difficult at all Not difficult at all Not difficult at all Not difficult at all    Pertinent labs & imaging results that were available during my care of the patient were reviewed by me and considered in my medical decision making.  Assessment & Plan:  Jacob Rios was seen today for wound  check.  Diagnoses and all orders for this visit:  Arm wound, right, subsequent encounter Referral placed.  Wound dressed with xeroform and gauze. Provided supplies to patient to continue dressing at home.  Will repeat imaging as below. Xray not in office today. Will have patient come in one day next week.  Will complete abx as below.  -  Ambulatory referral to General Surgery -     doxycycline  (VIBRA -TABS) 100 MG tablet; Take 1 tablet (100 mg total) by mouth 2 (two) times daily for 7 days. -     DG Forearm Right; Future   Continue all other maintenance medications.  Follow up plan: Return if symptoms worsen or fail to improve.   Continue healthy lifestyle choices, including diet (rich in fruits, vegetables, and lean proteins, and low in salt and simple carbohydrates) and exercise (at least 30 minutes of moderate physical activity daily).  Written and verbal instructions provided   The above assessment and management plan was discussed with the patient. The patient verbalized understanding of and has agreed to the management plan. Patient is aware to call the clinic if they develop any new symptoms or if symptoms persist or worsen. Patient is aware when to return to the clinic for a follow-up visit. Patient educated on when it is appropriate to go to the emergency department.   Jacob Mates, DNP-FNP [facility] [address] [place], [place] [address] [phone]

## 2023-05-15 NOTE — Telephone Encounter (Signed)
 Appointment made. LS

## 2023-05-18 ENCOUNTER — Other Ambulatory Visit (INDEPENDENT_AMBULATORY_CARE_PROVIDER_SITE_OTHER)

## 2023-05-18 DIAGNOSIS — S41101D Unspecified open wound of right upper arm, subsequent encounter: Secondary | ICD-10-CM | POA: Diagnosis not present

## 2023-05-18 DIAGNOSIS — M7989 Other specified soft tissue disorders: Secondary | ICD-10-CM | POA: Diagnosis not present

## 2023-05-26 ENCOUNTER — Ambulatory Visit: Admitting: General Surgery

## 2023-05-27 ENCOUNTER — Encounter: Payer: Self-pay | Admitting: General Surgery

## 2023-05-27 ENCOUNTER — Ambulatory Visit: Admitting: General Surgery

## 2023-05-27 VITALS — BP 155/70 | HR 61 | Temp 98.3°F | Resp 16 | Ht 71.0 in | Wt 205.0 lb

## 2023-05-27 DIAGNOSIS — D485 Neoplasm of uncertain behavior of skin: Secondary | ICD-10-CM | POA: Diagnosis not present

## 2023-05-27 NOTE — Patient Instructions (Addendum)
 You can continue your Plavix  for this procedure.  You can eat before your local procedure.

## 2023-05-27 NOTE — Progress Notes (Signed)
 [facility] History and Physical  Reason for Referral: Right arm mass  Referring Physician: Delfina Feller, FNP   Chief Complaint   New Patient (Initial Visit)     Jacob Rios is a 78 y.o. male.  HPI:   Discussed the use of AI scribe software for clinical note transcription with the patient, who gave verbal consent to proceed.  History of Present Illness Jacob Rios is a 78 year old male who presents with a non-healing ulcerated lesion on his right arm. He was referred by his PCP for evaluation of a persistent ulcerated lesion.  He has a one centimeter ulcerated lesion on the dorsal aspect of his right arm, present for approximately ten months. The lesion initially appeared after being pricked by rose thorns while mowing the lawn. It exhibits a pattern of partial healing and scabbing.   He has attempted various treatments including Betadine, alcohol, and two rounds of doxycycline , without improvement. Two x-rays have been performed, showing a two millimeter foreign body in the area of the lesion.  The lesion is not painful unless he attempts to remove the scab, at which point it becomes painful. He has noticed minimal bleeding and pus from the lesion. No history of skin cancer and no other similar lesions have been reported.  He is currently on Plavix  following a coronary artery bypass graft (CABG) in the past.    He reports no prior skin cancer history. He has been in the sun a lot in his life. His son had a skin cancer removed from his arm this past year.    Past Medical History:  Diagnosis Date   Cataract    CKD (chronic kidney disease), stage III (HCC)    followed by pcp   Coronary artery disease cardiologist--- dr Abel Hoe   08/ 1999  s/p  cath w/ PTCA and stenting to RCA;   05/ 2000 inferior wall MI , 06-06-1998 s/p cabg x3;   Last heart cath 2005,  2 patent grafts (diagnol and RCA) and occluded LIMA--LAD graft with normal LAD nonobstructive  disease;  last nuclear study 11/ 2014 no evidence ishcemia, ef 40%   Full dentures    Hiatal hernia    History of acute inferior wall MI 05/1998   s/p  cabg   Hypertension    Hypertensive retinopathy    Mixed hyperlipidemia    Nocturia more than twice per night    PAD (peripheral artery disease) (HCC)    left common iliac artery stenosis per aorta ultrasound 03/ 2021 in epic and cardiology note   Prostate cancer [facility]) urologist--- dr Inga Manges   first dx 07/ 2015 in active survillance until bx 11-11-2019,  Stage T2a, Gleason 3+4, PSA 13.3   S/P CABG x 3 06/06/1998   LIMA--LAD, SVG to Diagonal, RIMA to RCA   S/P primary angioplasty with coronary stent 08/1997   stent to RCA   Type 2 diabetes mellitus (HCC)    followed by pcp  (03-06-2020 checks blood sugar dialy in am,  fasting sugar-- 110-130)    Past Surgical History:  Procedure Laterality Date   APPENDECTOMY  child   CARDIAC CATHETERIZATION  09/01/2000  @[facility]    patent RCA and Diagonal grafts, atretic LIMA, moderate nonobstructive proxLAD, normal lvsf   CARDIAC CATHETERIZATION  04-21-2003  @[facility]    LIMA--LAD graft occluded, native LAD with nonobstructive disease,  patent diagonal/ rca grafts   CATARACT EXTRACTION     CATARACT EXTRACTION W/ INTRAOCULAR LENS IMPLANT Right 12/2018  CORONARY ANGIOPLASTY WITH STENT PLACEMENT  08/1997  @[facility]    ptca w/ stenting to rca   CORONARY ARTERY BYPASS GRAFT  06-06-1998  @[facility]     LIMA -- LAD, SVG -- Diagonal,  RIMA to RCA   CYSTOSCOPY N/A 03/09/2020   Procedure: CYSTOSCOPY FLEXIBLE;  Surgeon: Homero Luster, MD;  Location: [facility];  Service: Urology;  Laterality: N/A;  NO SEEDS FOUND IN BLADDER   EYE SURGERY     INGUINAL HERNIA REPAIR Right 1974   LOOP RECORDER INSERTION N/A 08/30/2020   Procedure: LOOP RECORDER INSERTION;  Surgeon: Boyce Byes, MD;  Location: [facility];  Service: Cardiovascular;  Laterality: N/A;   PROSTATE BIOPSY  06/2017   x 4-5    RADIOACTIVE SEED  IMPLANT N/A 03/09/2020   Procedure: RADIOACTIVE SEED IMPLANT/BRACHYTHERAPY IMPLANT;  Surgeon: Homero Luster, MD;  Location: [facility];  Service: Urology;  Laterality: N/A;   70  SEEDS IMPLANTED   SPACE OAR INSTILLATION N/A 03/09/2020   Procedure: SPACE OAR INSTILLATION;  Surgeon: Homero Luster, MD;  Location: [facility];  Service: Urology;  Laterality: N/A;   VENTRAL HERNIA REPAIR  09/ 2000 and recurrent repair 06/ 2001    Family History  Problem Relation Age of Onset   Lung cancer Mother    Dementia Father    Hyperlipidemia Father    Congestive Heart Failure Father    Post-traumatic stress disorder Son    Alcohol abuse Maternal Grandfather    Cancer Maternal Grandfather 50       stomach cancer    Heart attack Neg Hx    Stroke Neg Hx    Breast cancer Neg Hx    Colon cancer Neg Hx    Prostate cancer Neg Hx    Pancreatic cancer Neg Hx     Social History   Tobacco Use   Smoking status: Former    Current packs/day: 0.00    Average packs/day: 4.0 packs/day for 45.0 years (180.0 ttl pk-yrs)    Types: Cigarettes    Start date: 01/14/1952    Quit date: 01/13/1997    Years since quitting: 26.3   Smokeless tobacco: Never   Tobacco comments:    reports he began smoking at age 83  Vaping Use   Vaping status: Never Used  Substance Use Topics   Alcohol use: No   Drug use: Never    Medications: I have reviewed the patient's current medications. Allergies as of 05/27/2023       Reactions   Morphine Shortness Of Breath, Swelling        Medication List        Accurate as of May 27, 2023 11:42 AM. If you have any questions, ask your nurse or doctor.          ascorbic acid 500 MG tablet Commonly known as: VITAMIN C Take 500 mg by mouth daily.   atorvastatin  40 MG tablet Commonly known as: LIPITOR Take 1 tablet (40 mg total) by mouth daily.   clopidogrel  75 MG tablet Commonly known as: PLAVIX  Take 1 tablet (75 mg total) by mouth daily.    D3 ADULT PO Take 1 tablet by mouth daily.   glipiZIDE  10 MG 24 hr tablet Commonly known as: GLUCOTROL  XL Take 1 tablet (10 mg total) by mouth daily.   glucose blood test strip Commonly known as: OneTouch Verio Test 1X per day and as needed  Dx 250.02   lisinopril  2.5 MG tablet Commonly known  as: ZESTRIL  Take 1 tablet (2.5 mg total) by mouth daily.   metFORMIN  1000 MG tablet Commonly known as: GLUCOPHAGE  Take 1 tablet (1,000 mg total) by mouth 2 (two) times daily with a meal.   metoprolol  succinate 50 MG 24 hr tablet Commonly known as: TOPROL -XL TAKE 1 TABLET BY MOUTH ONCE DAILY WITH OR IMMEDIATELY FOLLOWING A MEAL   omeprazole  40 MG capsule Commonly known as: PRILOSEC Take 1 capsule (40 mg total) by mouth daily.   onetouch ultrasoft lancets Patient test 1X per day and prn  Dx 250.02         ROS:  A comprehensive review of systems was negative except for: Eyes: positive for blurry vision Ears, nose, mouth, throat, and face: positive for sinus issues Genitourinary: positive for frequency Endocrine: positive for diabetes  Blood pressure (!) 155/70, pulse 61, temperature 98.3 F (36.8 C), temperature source Oral, resp. rate 16, height 5\' 11"  (1.803 m), weight 205 lb (93 kg), SpO2 95%. Physical Exam Physical Exam GENERAL: Alert, cooperative, well developed, no acute distress. HEENT: Normocephalic, normal oropharynx, moist mucous membranes. CHEST: Clear to auscultation bilaterally, no wheezes, rhonchi, or crackles. CARDIOVASCULAR: Normal heart rate and rhythm ABDOMEN: Soft, non-tender, non-distended, without organomegaly, normal bowel sounds. EXTREMITIES: No cyanosis or edema. NEUROLOGICAL: Cranial nerves grossly intact, moves all extremities without gross motor or sensory deficit. RIGHT FOREARM SKIN: Ulcerated lesion on right dorsal arm, 1.5cm, scabbed, slightly raised, some scaling   Results: Personally reviewed the imaging and showed the patient and his wife and  looked at the location of the foreign body on the Xray in comparison to the ulcerated area, unfortunately the ulcer was not marked on the Xray, based on my measurements the foreign body is about 13.5 cm from the bony prominence of the elbow, while his ulcerated lesion is more in the 15-15.5cm area or more approximate 11.5 cm from the wrist. The location of the foreign body is superior to the ulcerated lesion based on my view on the lesion in real time and the location on the Xray  CLINICAL DATA:  Wound   EXAM: RIGHT FOREARM - 2 VIEW   COMPARISON:  01/20/2023   FINDINGS: No fracture. No dislocation. No erosion or periosteal elevation. 2 mm radiopaque foreign body is again seen within the superficial soft tissues of the dorsal mid forearm. There is soft tissue swelling in this area. No convincing soft tissue gas.   IMPRESSION: 1. No acute osseous abnormality. 2. Redemonstration of a 2 mm radiopaque foreign body within the superficial soft tissues of the dorsal mid forearm. There is soft tissue swelling in this area.     Electronically Signed   By: Leverne Reading D.O.   On: 05/25/2023 10:37   Assessment & Plan Ulcerated lesion on right arm Chronic ulcerated lesion for 10 months. I am not convinced this has anything to do with this foreign body and I think the foreign body is actually 1.5 cm superior to the lesion.  With this non-healing characteristic, I am afraid this could be a cancer.   - Discussed performing full thickness skin biopsy with elliptical excision of the area, for basal cell would need a 4mm margin, and for a low risk squamous cell <2cm he would need a 4mm margin.  - Send tissue for pathological examination to determine the pathology - I do not think the foreign body will be removed but it could be in the specimen, but it would be like removing a needle in a haystack and

## 2023-05-28 ENCOUNTER — Ambulatory Visit: Payer: Self-pay | Admitting: Family Medicine

## 2023-05-28 NOTE — Progress Notes (Signed)
 Consistent with prior imaging. Patient to continue to follow up with general surgery and monitor for any signs of infection.

## 2023-05-28 NOTE — H&P (Signed)
 [facility] History and Physical  Reason for Referral: Right arm mass  Referring Physician: Delfina Feller, FNP   Chief Complaint   New Patient (Initial Visit)     Jacob Rios is a 77 y.o. male.  HPI:   Discussed the use of AI scribe software for clinical note transcription with the patient, who gave verbal consent to proceed.  History of Present Illness Jacob Rios is a 78 year old male who presents with a non-healing ulcerated lesion on his right arm. He was referred by his PCP for evaluation of a persistent ulcerated lesion.  He has a one centimeter ulcerated lesion on the dorsal aspect of his right arm, present for approximately ten months. The lesion initially appeared after being pricked by rose thorns while mowing the lawn. It exhibits a pattern of partial healing and scabbing.   He has attempted various treatments including Betadine, alcohol, and two rounds of doxycycline , without improvement. Two x-rays have been performed, showing a two millimeter foreign body in the area of the lesion.  The lesion is not painful unless he attempts to remove the scab, at which point it becomes painful. He has noticed minimal bleeding and pus from the lesion. No history of skin cancer and no other similar lesions have been reported.  He is currently on Plavix  following a coronary artery bypass graft (CABG) in the past.    He reports no prior skin cancer history. He has been in the sun a lot in his life. His son had a skin cancer removed from his arm this past year.    Past Medical History:  Diagnosis Date   Cataract    CKD (chronic kidney disease), stage III (HCC)    followed by pcp   Coronary artery disease cardiologist--- dr Abel Hoe   08/ 1999  s/p  cath w/ PTCA and stenting to RCA;   05/ 2000 inferior wall MI , 06-06-1998 s/p cabg x3;   Last heart cath 2005,  2 patent grafts (diagnol and RCA) and occluded LIMA--LAD graft with normal LAD nonobstructive  disease;  last nuclear study 11/ 2014 no evidence ishcemia, ef 40%   Full dentures    Hiatal hernia    History of acute inferior wall MI 05/1998   s/p  cabg   Hypertension    Hypertensive retinopathy    Mixed hyperlipidemia    Nocturia more than twice per night    PAD (peripheral artery disease) (HCC)    left common iliac artery stenosis per aorta ultrasound 03/ 2021 in epic and cardiology note   Prostate cancer [facility]) urologist--- dr Inga Manges   first dx 07/ 2015 in active survillance until bx 11-11-2019,  Stage T2a, Gleason 3+4, PSA 13.3   S/P CABG x 3 06/06/1998   LIMA--LAD, SVG to Diagonal, RIMA to RCA   S/P primary angioplasty with coronary stent 08/1997   stent to RCA   Type 2 diabetes mellitus (HCC)    followed by pcp  (03-06-2020 checks blood sugar dialy in am,  fasting sugar-- 110-130)    Past Surgical History:  Procedure Laterality Date   APPENDECTOMY  child   CARDIAC CATHETERIZATION  09/01/2000  @[facility]    patent RCA and Diagonal grafts, atretic LIMA, moderate nonobstructive proxLAD, normal lvsf   CARDIAC CATHETERIZATION  04-21-2003  @[facility]    LIMA--LAD graft occluded, native LAD with nonobstructive disease,  patent diagonal/ rca grafts   CATARACT EXTRACTION     CATARACT EXTRACTION W/ INTRAOCULAR LENS IMPLANT Right 12/2018  CORONARY ANGIOPLASTY WITH STENT PLACEMENT  08/1997  @[facility]    ptca w/ stenting to rca   CORONARY ARTERY BYPASS GRAFT  06-06-1998  @[facility]     LIMA -- LAD, SVG -- Diagonal,  RIMA to RCA   CYSTOSCOPY N/A 03/09/2020   Procedure: CYSTOSCOPY FLEXIBLE;  Surgeon: Homero Luster, MD;  Location: [facility];  Service: Urology;  Laterality: N/A;  NO SEEDS FOUND IN BLADDER   EYE SURGERY     INGUINAL HERNIA REPAIR Right 1974   LOOP RECORDER INSERTION N/A 08/30/2020   Procedure: LOOP RECORDER INSERTION;  Surgeon: Boyce Byes, MD;  Location: [facility];  Service: Cardiovascular;  Laterality: N/A;   PROSTATE BIOPSY  06/2017   x 4-5    RADIOACTIVE SEED  IMPLANT N/A 03/09/2020   Procedure: RADIOACTIVE SEED IMPLANT/BRACHYTHERAPY IMPLANT;  Surgeon: Homero Luster, MD;  Location: [facility];  Service: Urology;  Laterality: N/A;   70  SEEDS IMPLANTED   SPACE OAR INSTILLATION N/A 03/09/2020   Procedure: SPACE OAR INSTILLATION;  Surgeon: Homero Luster, MD;  Location: [facility];  Service: Urology;  Laterality: N/A;   VENTRAL HERNIA REPAIR  09/ 2000 and recurrent repair 06/ 2001    Family History  Problem Relation Age of Onset   Lung cancer Mother    Dementia Father    Hyperlipidemia Father    Congestive Heart Failure Father    Post-traumatic stress disorder Son    Alcohol abuse Maternal Grandfather    Cancer Maternal Grandfather 50       stomach cancer    Heart attack Neg Hx    Stroke Neg Hx    Breast cancer Neg Hx    Colon cancer Neg Hx    Prostate cancer Neg Hx    Pancreatic cancer Neg Hx     Social History   Tobacco Use   Smoking status: Former    Current packs/day: 0.00    Average packs/day: 4.0 packs/day for 45.0 years (180.0 ttl pk-yrs)    Types: Cigarettes    Start date: 01/14/1952    Quit date: 01/13/1997    Years since quitting: 26.3   Smokeless tobacco: Never   Tobacco comments:    reports he began smoking at age 83  Vaping Use   Vaping status: Never Used  Substance Use Topics   Alcohol use: No   Drug use: Never    Medications: I have reviewed the patient's current medications. Allergies as of 05/27/2023       Reactions   Morphine Shortness Of Breath, Swelling        Medication List        Accurate as of May 27, 2023 11:42 AM. If you have any questions, ask your nurse or doctor.          ascorbic acid 500 MG tablet Commonly known as: VITAMIN C Take 500 mg by mouth daily.   atorvastatin  40 MG tablet Commonly known as: LIPITOR Take 1 tablet (40 mg total) by mouth daily.   clopidogrel  75 MG tablet Commonly known as: PLAVIX  Take 1 tablet (75 mg total) by mouth daily.    D3 ADULT PO Take 1 tablet by mouth daily.   glipiZIDE  10 MG 24 hr tablet Commonly known as: GLUCOTROL  XL Take 1 tablet (10 mg total) by mouth daily.   glucose blood test strip Commonly known as: OneTouch Verio Test 1X per day and as needed  Dx 250.02   lisinopril  2.5 MG tablet Commonly known  as: ZESTRIL  Take 1 tablet (2.5 mg total) by mouth daily.   metFORMIN  1000 MG tablet Commonly known as: GLUCOPHAGE  Take 1 tablet (1,000 mg total) by mouth 2 (two) times daily with a meal.   metoprolol  succinate 50 MG 24 hr tablet Commonly known as: TOPROL -XL TAKE 1 TABLET BY MOUTH ONCE DAILY WITH OR IMMEDIATELY FOLLOWING A MEAL   omeprazole  40 MG capsule Commonly known as: PRILOSEC Take 1 capsule (40 mg total) by mouth daily.   onetouch ultrasoft lancets Patient test 1X per day and prn  Dx 250.02         ROS:  A comprehensive review of systems was negative except for: Eyes: positive for blurry vision Ears, nose, mouth, throat, and face: positive for sinus issues Genitourinary: positive for frequency Endocrine: positive for diabetes  Blood pressure (!) 155/70, pulse 61, temperature 98.3 F (36.8 C), temperature source Oral, resp. rate 16, height 5\' 11"  (1.803 m), weight 205 lb (93 kg), SpO2 95%. Physical Exam Physical Exam GENERAL: Alert, cooperative, well developed, no acute distress. HEENT: Normocephalic, normal oropharynx, moist mucous membranes. CHEST: Clear to auscultation bilaterally, no wheezes, rhonchi, or crackles. CARDIOVASCULAR: Normal heart rate and rhythm ABDOMEN: Soft, non-tender, non-distended, without organomegaly, normal bowel sounds. EXTREMITIES: No cyanosis or edema. NEUROLOGICAL: Cranial nerves grossly intact, moves all extremities without gross motor or sensory deficit. RIGHT FOREARM SKIN: Ulcerated lesion on right dorsal arm, 1.5cm, scabbed, slightly raised, some scaling   Results: Personally reviewed the imaging and showed the patient and his wife and  looked at the location of the foreign body on the Xray in comparison to the ulcerated area, unfortunately the ulcer was not marked on the Xray, based on my measurements the foreign body is about 13.5 cm from the bony prominence of the elbow, while his ulcerated lesion is more in the 15-15.5cm area or more approximate 11.5 cm from the wrist. The location of the foreign body is superior to the ulcerated lesion based on my view on the lesion in real time and the location on the Xray  CLINICAL DATA:  Wound   EXAM: RIGHT FOREARM - 2 VIEW   COMPARISON:  01/20/2023   FINDINGS: No fracture. No dislocation. No erosion or periosteal elevation. 2 mm radiopaque foreign body is again seen within the superficial soft tissues of the dorsal mid forearm. There is soft tissue swelling in this area. No convincing soft tissue gas.   IMPRESSION: 1. No acute osseous abnormality. 2. Redemonstration of a 2 mm radiopaque foreign body within the superficial soft tissues of the dorsal mid forearm. There is soft tissue swelling in this area.     Electronically Signed   By: Leverne Reading D.O.   On: 05/25/2023 10:37   Assessment & Plan Ulcerated lesion on right arm Chronic ulcerated lesion for 10 months. I am not convinced this has anything to do with this foreign body and I think the foreign body is actually 1.5 cm superior to the lesion.  With this non-healing characteristic, I am afraid this could be a cancer.   - Discussed performing full thickness skin biopsy with elliptical excision of the area, for basal cell would need a 4mm margin, and for a low risk squamous cell <2cm he would need a 4mm margin.  - Send tissue for pathological examination to determine the pathology - I do not think the foreign body will be removed but it could be in the specimen, but it would be like removing a needle in a haystack and  I do not think it is the cause of this ulcerated area.  - Discuss risks: bleeding, infection,  further surgery if cancer detected, additional risk of bleeding due to his plavix  use. Given heart history and minor procedure, will plan to continue his plavix .  - Discuss potential need for more surgery or referral if cancer detected pending the pathology and margins.      All questions were answered to the satisfaction of the patient and family.   Awilda Bogus 05/27/2023, 11:42 AM

## 2023-06-04 ENCOUNTER — Encounter (HOSPITAL_COMMUNITY)
Admission: RE | Disposition: A | Discharge status: Home / Self Care | Payer: Self-pay | Source: Home / Self Care | Attending: General Surgery

## 2023-06-04 ENCOUNTER — Encounter (HOSPITAL_COMMUNITY): Payer: Self-pay | Admitting: General Surgery

## 2023-06-04 ENCOUNTER — Other Ambulatory Visit: Payer: Self-pay

## 2023-06-04 ENCOUNTER — Ambulatory Visit (HOSPITAL_COMMUNITY)
Admission: RE | Admit: 2023-06-04 | Discharge: 2023-06-04 | Disposition: A | Discharge status: Home / Self Care | Attending: General Surgery | Admitting: General Surgery

## 2023-06-04 DIAGNOSIS — L905 Scar conditions and fibrosis of skin: Secondary | ICD-10-CM | POA: Diagnosis not present

## 2023-06-04 DIAGNOSIS — L989 Disorder of the skin and subcutaneous tissue, unspecified: Secondary | ICD-10-CM | POA: Diagnosis not present

## 2023-06-04 DIAGNOSIS — C44612 Basal cell carcinoma of skin of right upper limb, including shoulder: Secondary | ICD-10-CM | POA: Diagnosis not present

## 2023-06-04 DIAGNOSIS — D485 Neoplasm of uncertain behavior of skin: Secondary | ICD-10-CM | POA: Diagnosis not present

## 2023-06-04 DIAGNOSIS — R2231 Localized swelling, mass and lump, right upper limb: Secondary | ICD-10-CM | POA: Insufficient documentation

## 2023-06-04 HISTORY — PX: EXCISION, MASS, UPPER EXTREMITY: SHX7567

## 2023-06-04 LAB — GLUCOSE, CAPILLARY: Glucose-Capillary: 95 mg/dL (ref 70–99)

## 2023-06-04 SURGERY — EXCISION, MASS, UPPER EXTREMITY
Anesthesia: LOCAL | Laterality: Right

## 2023-06-04 MED ORDER — LIDOCAINE HCL (PF) 1 % IJ SOLN
INTRAMUSCULAR | Status: DC | PRN
  Administered 2023-06-04: 5 mL

## 2023-06-04 MED ORDER — LIDOCAINE HCL (PF) 1 % IJ SOLN
INTRAMUSCULAR | Status: AC
  Filled 2023-06-04: qty 30

## 2023-06-04 MED ORDER — OXYCODONE HCL 5 MG PO TABS: 5.0000 mg | ORAL_TABLET | ORAL | 0 refills | Status: DC | PRN

## 2023-06-04 SURGICAL SUPPLY — 12 items
BANDAGE COBAN STERILE 2 (GAUZE/BANDAGES/DRESSINGS) IMPLANT
ELECTRODE REM PT RTRN 9FT ADLT (ELECTROSURGICAL) IMPLANT
GAUZE SPONGE 4X4 8PLY STR LF (GAUZE/BANDAGES/DRESSINGS) IMPLANT
GLOVE BIO SURGEON STRL SZ 6.5 (GLOVE) IMPLANT
GLOVE BIOGEL PI IND STRL 6.5 (GLOVE) IMPLANT
NDL 18GX1X1/2 (RX/OR ONLY) (NEEDLE) IMPLANT
NEEDLE 18GX1X1/2 (RX/OR ONLY) (NEEDLE) ×1 IMPLANT
PENCIL HANDSWITCHING (ELECTRODE) IMPLANT
SUT 3-0 BLK 1X30 PSL (SUTURE) IMPLANT
SUT VIC AB 3-0 SH 27X BRD (SUTURE) IMPLANT
SYR 30ML LL (SYRINGE) IMPLANT
TOWEL OR 17X26 4PK STRL BLUE (TOWEL DISPOSABLE) IMPLANT

## 2023-06-04 NOTE — Interval H&P Note (Signed)
 History and Physical Interval Note:  06/04/2023 9:21 AM  Jacob Rios  has presented today for surgery, with the diagnosis of SKIN LESION RIGHT ARM 2- 3 CM.  The various methods of treatment have been discussed with the patient and family. After consideration of risks, benefits and other options for treatment, the patient has consented to  Procedure(s) with comments: EXCISION, MASS, UPPER EXTREMITY (Right) - MINOR PROCEDURE ROOM as a surgical intervention.  The patient's history has been reviewed, patient examined, no change in status, stable for surgery.  I have reviewed the patient's chart and labs.  Questions were answered to the patient's satisfaction.     Awilda Bogus

## 2023-06-04 NOTE — Op Note (Signed)
 [facility] Operative Note  06/04/23  Preoperative Diagnosis: Right forearm dorsal skin lesion   Postoperative Diagnosis: Same   Procedure(s) Performed: Elliptical Excision of skin lesion with margins (2X3cm excision)    Surgeon: Dixon Fredrickson. Collene Dawson, MD   Assistants: No qualified resident was available    Anesthesia: Lidocaine  1%     Specimens: Dorsal Forearm Skin Lesion (short suture superior, long suture lateral (thumb side))   Estimated Blood Loss: Minimal   Blood Replacement: None    Complications: None   Wound Class: Clean    Operative Indications: Mr .Mullaly is a 78 yo who has a ulcerated area on his right forearm for multiple months. He had thought this was a wound related to a foreign body. When I saw him and evaluated it, I did not think the foreign body 2mm area on Xray was in the location of the ulcer and was worried this could a skin cancer. Discussed excision of the lesion with margins and risk of bleeding, infection, needing more surgery. Discussed that the foreign body may not be removed as it is superior to the area on Xray by my interpretation.   Findings: Ulcerated skin lesion right forearm 2cm    Procedure: The patient was taken to the procedure room and placed semi upright. General endotracheal anesthesia was induced.  The right forearm was prepped and draped in the usual sterile fashion.   Lidocaine  was injected. An elliptical incision was marked out to allow for 5mm + margins around the area. With a scalpel, I incised the skin and carried this down to the subcutaneous tissue. A full thickness skin excision was done. The cavity was made hemostatic. A short suture was placed superior, and a long suture was placed lateral (lateral being thumb side of dorsal forearm).  Skin flaps were created. The deep space was closed with interrupted 3-0 Vicryl. The skin was closed with 3-0 Nylon. In the central portion I used Vertical mattress type sutures, and I  closed the edges with interrupted sutures after trimming the excess skin from the dog tag. There was minimal tension on the incision. The incision was dressed with gauze and Coband.   Final inspection revealed acceptable hemostasis. All counts were correct at the end of the case. The patient tolerated the procedure well.    Deena Farrier, MD [facility] [address] Anise Barlow [place], [place] [address] [phone] (office)

## 2023-06-04 NOTE — Discharge Instructions (Signed)
 Discharge Instructions:  Common Complaints: Pain at the incision site is common.  Some bruising is common. If you are bleeding, hold deep firm pressure with your finger tips over the area for 10 minutes (your knuckles should be white from the pressure). You can then replace the bandage if needed.   Diet/ Activity: Diet as tolerated.  Keep dressing in place for 48 hours (can remove on 06/06/23).  You can take a bird bath between now and then and do not get the dressing wet.  After 48 hours, you can remove and take showers and get the area wet. Pat the area dry. You can replace the bandage with gauze and [place] daily for at least the next 7 days after removing the first bandage. You can put some neosporin on the area after 48 hours with each dressing change. Limit excessive movement, lifting > 10 lbs, stretching with the limb if there is an incision on your arm/armpit or leg.   Limit stretching, pulling on your incision if it is located on other parts of your body.   Medication: Take tylenol  as needed for pain control, alternating every 4-6 hours.  Example:  Tylenol  1000mg  @ 6am, 12noon, 6pm, (Do not exceed 4000mg  of tylenol  a day).  Take Roxicodone  for breakthrough pain every 4 hours.  Take Colace for constipation related to narcotic pain medication. If you do not have a bowel movement in 2 days, take Miralax over the counter.  Drink plenty of water to also prevent constipation.   Contact Information: If you have questions or concerns, please call our office, [phone], Monday- Thursday 8AM-5PM and Friday 8AM-12Noon.  If it is after hours or on the weekend, please call [facility], [phone], [phone], and ask to speak to the surgeon on call for Dr. Collene Dawson at [facility].

## 2023-06-05 ENCOUNTER — Encounter (HOSPITAL_COMMUNITY): Payer: Self-pay | Admitting: General Surgery

## 2023-06-05 ENCOUNTER — Ambulatory Visit (INDEPENDENT_AMBULATORY_CARE_PROVIDER_SITE_OTHER): Payer: Self-pay | Admitting: General Surgery

## 2023-06-05 DIAGNOSIS — C4491 Basal cell carcinoma of skin, unspecified: Secondary | ICD-10-CM

## 2023-06-05 LAB — SURGICAL PATHOLOGY

## 2023-06-05 NOTE — Progress Notes (Signed)
 I notified the patient that the pathology was ulcerating, infiltrating basal cell cancer. Margins are free of cancer. The closest margin was 3mm. The margin recommendation is 4mm. I do feel like I removed 5mm margins on my measurement and the specimen likely shrank down /drew up some (skin samples reported to shrink as much as 20%). I think our margins are good and I told his wife this when I talked to her on the phone. Discussed that no foreign body was noted and as I suspected that foreign body was superior to this lesion. Discussed that this area can be monitored and if he has any other non healing wounds that he needs to assume it is a cancer and get it removed, looked at. If he were to have a recurrence, we could send him to [facility] in [place] for further removal if needed. I think this will be unlikely.

## 2023-06-09 ENCOUNTER — Ambulatory Visit (INDEPENDENT_AMBULATORY_CARE_PROVIDER_SITE_OTHER): Payer: Medicare PPO

## 2023-06-09 DIAGNOSIS — I639 Cerebral infarction, unspecified: Secondary | ICD-10-CM | POA: Diagnosis not present

## 2023-06-09 LAB — CUP PACEART REMOTE DEVICE CHECK
Date Time Interrogation Session: 20250526231110
Implantable Pulse Generator Implant Date: 20220818

## 2023-06-10 ENCOUNTER — Ambulatory Visit: Payer: Self-pay | Admitting: Cardiology

## 2023-06-18 ENCOUNTER — Ambulatory Visit (INDEPENDENT_AMBULATORY_CARE_PROVIDER_SITE_OTHER): Admitting: General Surgery

## 2023-06-18 ENCOUNTER — Encounter: Payer: Self-pay | Admitting: General Surgery

## 2023-06-18 VITALS — BP 111/62 | HR 56 | Temp 98.0°F | Resp 12 | Ht 71.0 in | Wt 208.0 lb

## 2023-06-18 DIAGNOSIS — C4491 Basal cell carcinoma of skin, unspecified: Secondary | ICD-10-CM

## 2023-06-18 NOTE — Patient Instructions (Addendum)
 Steristrips will peel off in the next 5-7 days. You can remove them once they are peeling off. It is ok to shower. Pat the area dry.   Go see a dermatologist to look at your skin to ensure no other lesions that are developing or need to be looked at.   Pathology for the Dermatologist: A. SKIN LESION, FOREARM, LESION, EXCISION:  Ulcerated basal cell carcinoma, infiltrating type  Margins free  Dermal scar and inflammatory changes consistent with prior biopsy   Surgical margins were 4-5 mm in size.

## 2023-06-18 NOTE — Progress Notes (Signed)
 [facility]  Doing well. No issues.  BP 111/62   Pulse (!) 56   Temp 98 F (36.7 C) (Oral)   Resp 12   Ht 5\' 11"  (1.803 m)   Wt 208 lb (94.3 kg)   SpO2 93%   BMI 29.01 kg/m  Right arm sutures removed, incision healing, no erythema or drainage, steri strips placed  Patient s/p basal cell cancer from the right posterior arm. Doing well. Margins were 4-31mm which is sufficient.   Steristrips will peel off in the next 5-7 days. You can remove them once they are peeling off. It is ok to shower. Pat the area dry.   Go see a dermatologist to look at your skin to ensure no other lesions that are developing or need to be looked at on the rest of your body (given this new diagnosis).    Pathology for the Dermatologist: A. SKIN LESION, FOREARM, LESION, EXCISION:  Ulcerated basal cell carcinoma, infiltrating type  Margins free  Dermal scar and inflammatory changes consistent with prior biopsy   Deena Farrier, MD [facility] [address] Anise Barlow [place], [place] [address] [phone] (office)

## 2023-06-22 NOTE — Progress Notes (Signed)
 Carelink Summary Report / Loop Recorder

## 2023-06-22 NOTE — Addendum Note (Signed)
 Addended by: Edra Govern D on: 06/22/2023 09:44 AM   Modules accepted: Orders

## 2023-07-09 ENCOUNTER — Ambulatory Visit: Payer: Self-pay | Admitting: Cardiology

## 2023-07-09 ENCOUNTER — Ambulatory Visit (INDEPENDENT_AMBULATORY_CARE_PROVIDER_SITE_OTHER)

## 2023-07-09 DIAGNOSIS — I639 Cerebral infarction, unspecified: Secondary | ICD-10-CM

## 2023-07-09 LAB — CUP PACEART REMOTE DEVICE CHECK
Date Time Interrogation Session: 20250625230922
Implantable Pulse Generator Implant Date: 20220818

## 2023-07-13 ENCOUNTER — Emergency Department (HOSPITAL_COMMUNITY)

## 2023-07-13 ENCOUNTER — Other Ambulatory Visit: Payer: Self-pay

## 2023-07-13 ENCOUNTER — Encounter (HOSPITAL_COMMUNITY): Payer: Self-pay

## 2023-07-13 ENCOUNTER — Inpatient Hospital Stay (HOSPITAL_COMMUNITY)
Admission: EM | Admit: 2023-07-13 | Discharge: 2023-07-15 | DRG: 291 | Disposition: A | Discharge status: Home / Self Care | Attending: Internal Medicine | Admitting: Internal Medicine

## 2023-07-13 DIAGNOSIS — Z87891 Personal history of nicotine dependence: Secondary | ICD-10-CM | POA: Diagnosis not present

## 2023-07-13 DIAGNOSIS — Z7984 Long term (current) use of oral hypoglycemic drugs: Secondary | ICD-10-CM

## 2023-07-13 DIAGNOSIS — Z79899 Other long term (current) drug therapy: Secondary | ICD-10-CM

## 2023-07-13 DIAGNOSIS — I10 Essential (primary) hypertension: Secondary | ICD-10-CM

## 2023-07-13 DIAGNOSIS — I340 Nonrheumatic mitral (valve) insufficiency: Secondary | ICD-10-CM | POA: Diagnosis present

## 2023-07-13 DIAGNOSIS — R351 Nocturia: Secondary | ICD-10-CM | POA: Diagnosis present

## 2023-07-13 DIAGNOSIS — Z8679 Personal history of other diseases of the circulatory system: Secondary | ICD-10-CM

## 2023-07-13 DIAGNOSIS — E782 Mixed hyperlipidemia: Secondary | ICD-10-CM | POA: Diagnosis present

## 2023-07-13 DIAGNOSIS — I700 Atherosclerosis of aorta: Secondary | ICD-10-CM | POA: Diagnosis not present

## 2023-07-13 DIAGNOSIS — H5462 Unqualified visual loss, left eye, normal vision right eye: Secondary | ICD-10-CM | POA: Diagnosis present

## 2023-07-13 DIAGNOSIS — I25810 Atherosclerosis of coronary artery bypass graft(s) without angina pectoris: Secondary | ICD-10-CM | POA: Diagnosis present

## 2023-07-13 DIAGNOSIS — Z8249 Family history of ischemic heart disease and other diseases of the circulatory system: Secondary | ICD-10-CM

## 2023-07-13 DIAGNOSIS — K219 Gastro-esophageal reflux disease without esophagitis: Secondary | ICD-10-CM | POA: Diagnosis present

## 2023-07-13 DIAGNOSIS — I130 Hypertensive heart and chronic kidney disease with heart failure and stage 1 through stage 4 chronic kidney disease, or unspecified chronic kidney disease: Principal | ICD-10-CM | POA: Diagnosis present

## 2023-07-13 DIAGNOSIS — I493 Ventricular premature depolarization: Secondary | ICD-10-CM | POA: Diagnosis present

## 2023-07-13 DIAGNOSIS — I252 Old myocardial infarction: Secondary | ICD-10-CM

## 2023-07-13 DIAGNOSIS — I110 Hypertensive heart disease with heart failure: Secondary | ICD-10-CM | POA: Diagnosis not present

## 2023-07-13 DIAGNOSIS — I255 Ischemic cardiomyopathy: Secondary | ICD-10-CM | POA: Diagnosis not present

## 2023-07-13 DIAGNOSIS — Z801 Family history of malignant neoplasm of trachea, bronchus and lung: Secondary | ICD-10-CM

## 2023-07-13 DIAGNOSIS — E1122 Type 2 diabetes mellitus with diabetic chronic kidney disease: Secondary | ICD-10-CM | POA: Diagnosis not present

## 2023-07-13 DIAGNOSIS — I5023 Acute on chronic systolic (congestive) heart failure: Secondary | ICD-10-CM | POA: Diagnosis not present

## 2023-07-13 DIAGNOSIS — E785 Hyperlipidemia, unspecified: Secondary | ICD-10-CM | POA: Diagnosis not present

## 2023-07-13 DIAGNOSIS — I472 Ventricular tachycardia, unspecified: Secondary | ICD-10-CM | POA: Diagnosis not present

## 2023-07-13 DIAGNOSIS — I509 Heart failure, unspecified: Secondary | ICD-10-CM | POA: Diagnosis not present

## 2023-07-13 DIAGNOSIS — Z955 Presence of coronary angioplasty implant and graft: Secondary | ICD-10-CM | POA: Diagnosis not present

## 2023-07-13 DIAGNOSIS — Z1152 Encounter for screening for COVID-19: Secondary | ICD-10-CM | POA: Diagnosis not present

## 2023-07-13 DIAGNOSIS — I5043 Acute on chronic combined systolic (congestive) and diastolic (congestive) heart failure: Secondary | ICD-10-CM | POA: Diagnosis not present

## 2023-07-13 DIAGNOSIS — Z83438 Family history of other disorder of lipoprotein metabolism and other lipidemia: Secondary | ICD-10-CM

## 2023-07-13 DIAGNOSIS — I5031 Acute diastolic (congestive) heart failure: Secondary | ICD-10-CM | POA: Diagnosis not present

## 2023-07-13 DIAGNOSIS — Z85828 Personal history of other malignant neoplasm of skin: Secondary | ICD-10-CM

## 2023-07-13 DIAGNOSIS — R0602 Shortness of breath: Secondary | ICD-10-CM | POA: Diagnosis not present

## 2023-07-13 DIAGNOSIS — I69398 Other sequelae of cerebral infarction: Secondary | ICD-10-CM

## 2023-07-13 DIAGNOSIS — J9601 Acute respiratory failure with hypoxia: Secondary | ICD-10-CM | POA: Diagnosis present

## 2023-07-13 DIAGNOSIS — Z85118 Personal history of other malignant neoplasm of bronchus and lung: Secondary | ICD-10-CM

## 2023-07-13 DIAGNOSIS — I739 Peripheral vascular disease, unspecified: Secondary | ICD-10-CM | POA: Diagnosis present

## 2023-07-13 DIAGNOSIS — Z885 Allergy status to narcotic agent status: Secondary | ICD-10-CM | POA: Diagnosis not present

## 2023-07-13 DIAGNOSIS — N1832 Chronic kidney disease, stage 3b: Secondary | ICD-10-CM | POA: Diagnosis present

## 2023-07-13 DIAGNOSIS — J90 Pleural effusion, not elsewhere classified: Secondary | ICD-10-CM

## 2023-07-13 DIAGNOSIS — J811 Chronic pulmonary edema: Secondary | ICD-10-CM | POA: Diagnosis not present

## 2023-07-13 DIAGNOSIS — I2510 Atherosclerotic heart disease of native coronary artery without angina pectoris: Secondary | ICD-10-CM | POA: Diagnosis present

## 2023-07-13 DIAGNOSIS — N183 Chronic kidney disease, stage 3 unspecified: Secondary | ICD-10-CM | POA: Diagnosis present

## 2023-07-13 DIAGNOSIS — I5033 Acute on chronic diastolic (congestive) heart failure: Secondary | ICD-10-CM | POA: Diagnosis not present

## 2023-07-13 DIAGNOSIS — E1151 Type 2 diabetes mellitus with diabetic peripheral angiopathy without gangrene: Secondary | ICD-10-CM | POA: Diagnosis present

## 2023-07-13 DIAGNOSIS — I6930 Unspecified sequelae of cerebral infarction: Secondary | ICD-10-CM

## 2023-07-13 DIAGNOSIS — E119 Type 2 diabetes mellitus without complications: Secondary | ICD-10-CM

## 2023-07-13 DIAGNOSIS — D631 Anemia in chronic kidney disease: Secondary | ICD-10-CM | POA: Diagnosis present

## 2023-07-13 DIAGNOSIS — R0902 Hypoxemia: Secondary | ICD-10-CM | POA: Diagnosis present

## 2023-07-13 DIAGNOSIS — Z8546 Personal history of malignant neoplasm of prostate: Secondary | ICD-10-CM

## 2023-07-13 DIAGNOSIS — Z7902 Long term (current) use of antithrombotics/antiplatelets: Secondary | ICD-10-CM

## 2023-07-13 DIAGNOSIS — D649 Anemia, unspecified: Secondary | ICD-10-CM | POA: Diagnosis present

## 2023-07-13 DIAGNOSIS — I499 Cardiac arrhythmia, unspecified: Secondary | ICD-10-CM | POA: Diagnosis not present

## 2023-07-13 LAB — I-STAT CHEM 8, ED
BUN: 18 mg/dL (ref 8–23)
Calcium, Ion: 1.13 mmol/L — ABNORMAL LOW (ref 1.15–1.40)
Chloride: 110 mmol/L (ref 98–111)
Creatinine, Ser: 1.5 mg/dL — ABNORMAL HIGH (ref 0.61–1.24)
Glucose, Bld: 103 mg/dL — ABNORMAL HIGH (ref 70–99)
HCT: 30 % — ABNORMAL LOW (ref 39.0–52.0)
Hemoglobin: 10.2 g/dL — ABNORMAL LOW (ref 13.0–17.0)
Potassium: 4.4 mmol/L (ref 3.5–5.1)
Sodium: 141 mmol/L (ref 135–145)
TCO2: 20 mmol/L — ABNORMAL LOW (ref 22–32)

## 2023-07-13 LAB — CBC
HCT: 31.5 % — ABNORMAL LOW (ref 39.0–52.0)
Hemoglobin: 9.7 g/dL — ABNORMAL LOW (ref 13.0–17.0)
MCH: 26.2 pg (ref 26.0–34.0)
MCHC: 30.8 g/dL (ref 30.0–36.0)
MCV: 85.1 fL (ref 80.0–100.0)
Platelets: 218 10*3/uL (ref 150–400)
RBC: 3.7 MIL/uL — ABNORMAL LOW (ref 4.22–5.81)
RDW: 16.3 % — ABNORMAL HIGH (ref 11.5–15.5)
WBC: 5.2 10*3/uL (ref 4.0–10.5)
nRBC: 0 % (ref 0.0–0.2)

## 2023-07-13 LAB — HEMOGLOBIN A1C
Hgb A1c MFr Bld: 5.1 % (ref 4.8–5.6)
Mean Plasma Glucose: 99.67 mg/dL

## 2023-07-13 LAB — RESP PANEL BY RT-PCR (RSV, FLU A&B, COVID)  RVPGX2
Influenza A by PCR: NEGATIVE
Influenza B by PCR: NEGATIVE
Resp Syncytial Virus by PCR: NEGATIVE
SARS Coronavirus 2 by RT PCR: NEGATIVE

## 2023-07-13 LAB — CBG MONITORING, ED
Glucose-Capillary: 85 mg/dL (ref 70–99)
Glucose-Capillary: 133 mg/dL — ABNORMAL HIGH (ref 70–99)

## 2023-07-13 LAB — PROTIME-INR
INR: 1.1 (ref 0.8–1.2)
Prothrombin Time: 15.3 s — ABNORMAL HIGH (ref 11.4–15.2)

## 2023-07-13 LAB — BRAIN NATRIURETIC PEPTIDE: B Natriuretic Peptide: 1107.6 pg/mL — ABNORMAL HIGH (ref 0.0–100.0)

## 2023-07-13 LAB — GLUCOSE, CAPILLARY
Glucose-Capillary: 126 mg/dL — ABNORMAL HIGH (ref 70–99)
Glucose-Capillary: 84 mg/dL (ref 70–99)

## 2023-07-13 LAB — BASIC METABOLIC PANEL WITH GFR
Anion gap: 10 (ref 5–15)
BUN: 18 mg/dL (ref 8–23)
CO2: 20 mmol/L — ABNORMAL LOW (ref 22–32)
Calcium: 8.4 mg/dL — ABNORMAL LOW (ref 8.9–10.3)
Chloride: 109 mmol/L (ref 98–111)
Creatinine, Ser: 1.52 mg/dL — ABNORMAL HIGH (ref 0.61–1.24)
GFR, Estimated: 47 mL/min — ABNORMAL LOW (ref >60)
Glucose, Bld: 107 mg/dL — ABNORMAL HIGH (ref 70–99)
Potassium: 4.3 mmol/L (ref 3.5–5.1)
Sodium: 139 mmol/L (ref 135–145)

## 2023-07-13 LAB — TSH: TSH: 1.354 u[IU]/mL (ref 0.350–4.500)

## 2023-07-13 LAB — TROPONIN I (HIGH SENSITIVITY)
Troponin I (High Sensitivity): 10 ng/L (ref <18)
Troponin I (High Sensitivity): 12 ng/L (ref <18)

## 2023-07-13 MED ORDER — INSULIN ASPART 100 UNIT/ML IJ SOLN
0.0000 [IU] | Freq: Three times a day (TID) | INTRAMUSCULAR | Status: DC
  Administered 2023-07-13: 1 [IU] via SUBCUTANEOUS

## 2023-07-13 MED ORDER — SODIUM CHLORIDE 0.9% FLUSH
3.0000 mL | Freq: Two times a day (BID) | INTRAVENOUS | Status: DC
  Administered 2023-07-13 – 2023-07-15 (×5): 3 mL via INTRAVENOUS

## 2023-07-13 MED ORDER — ENOXAPARIN SODIUM 40 MG/0.4ML IJ SOSY: 40.0000 mg | PREFILLED_SYRINGE | INTRAMUSCULAR | Status: DC

## 2023-07-13 MED ORDER — GUAIFENESIN-DM 100-10 MG/5ML PO SYRP: 5.0000 mL | ORAL_SOLUTION | ORAL | Status: DC | PRN

## 2023-07-13 MED ORDER — ALUM & MAG HYDROXIDE-SIMETH 200-200-20 MG/5ML PO SUSP: 30.0000 mL | ORAL | Status: DC | PRN

## 2023-07-13 MED ORDER — SODIUM CHLORIDE 0.9 % IV SOLN: 250.0000 mL | INTRAVENOUS | Status: AC | PRN

## 2023-07-13 MED ORDER — IOHEXOL 350 MG/ML SOLN
75.0000 mL | Freq: Once | INTRAVENOUS | Status: AC | PRN
  Administered 2023-07-13: 75 mL via INTRAVENOUS

## 2023-07-13 MED ORDER — ENOXAPARIN SODIUM 40 MG/0.4ML IJ SOSY
40.0000 mg | PREFILLED_SYRINGE | INTRAMUSCULAR | Status: DC
  Administered 2023-07-13 – 2023-07-14 (×2): 40 mg via SUBCUTANEOUS
  Filled 2023-07-13 (×2): qty 0.4

## 2023-07-13 MED ORDER — ACETAMINOPHEN 325 MG PO TABS: 650.0000 mg | ORAL_TABLET | ORAL | Status: DC | PRN

## 2023-07-13 MED ORDER — CARMEX CLASSIC LIP BALM EX OINT: 1.0000 | TOPICAL_OINTMENT | CUTANEOUS | Status: DC | PRN

## 2023-07-13 MED ORDER — METOPROLOL SUCCINATE ER 50 MG PO TB24
50.0000 mg | ORAL_TABLET | Freq: Every day | ORAL | Status: DC
  Administered 2023-07-13 – 2023-07-15 (×3): 50 mg via ORAL
  Filled 2023-07-13: qty 2
  Filled 2023-07-13 (×2): qty 1

## 2023-07-13 MED ORDER — CLOPIDOGREL BISULFATE 75 MG PO TABS
75.0000 mg | ORAL_TABLET | Freq: Every day | ORAL | Status: DC
  Administered 2023-07-13 – 2023-07-15 (×3): 75 mg via ORAL
  Filled 2023-07-13 (×3): qty 1

## 2023-07-13 MED ORDER — LORATADINE 10 MG PO TABS: 10.0000 mg | ORAL_TABLET | Freq: Every day | ORAL | Status: DC | PRN

## 2023-07-13 MED ORDER — FUROSEMIDE 10 MG/ML IJ SOLN
40.0000 mg | Freq: Once | INTRAMUSCULAR | Status: AC
  Administered 2023-07-13: 40 mg via INTRAVENOUS
  Filled 2023-07-13: qty 4

## 2023-07-13 MED ORDER — ONDANSETRON HCL 4 MG/2ML IJ SOLN: 4.0000 mg | Freq: Four times a day (QID) | INTRAMUSCULAR | Status: DC | PRN

## 2023-07-13 MED ORDER — SODIUM CHLORIDE 0.9% FLUSH: 3.0000 mL | INTRAVENOUS | Status: DC | PRN

## 2023-07-13 MED ORDER — PANTOPRAZOLE SODIUM 40 MG PO TBEC
40.0000 mg | DELAYED_RELEASE_TABLET | Freq: Every day | ORAL | Status: DC
  Administered 2023-07-13 – 2023-07-15 (×3): 40 mg via ORAL
  Filled 2023-07-13 (×3): qty 1

## 2023-07-13 MED ORDER — MUSCLE RUB 10-15 % EX CREA: 1.0000 | TOPICAL_CREAM | CUTANEOUS | Status: DC | PRN

## 2023-07-13 MED ORDER — FUROSEMIDE 10 MG/ML IJ SOLN
40.0000 mg | Freq: Two times a day (BID) | INTRAMUSCULAR | Status: AC
  Administered 2023-07-13: 40 mg via INTRAVENOUS
  Filled 2023-07-13: qty 4

## 2023-07-13 MED ORDER — HYDROCORTISONE 1 % EX CREA: 1.0000 | TOPICAL_CREAM | Freq: Three times a day (TID) | CUTANEOUS | Status: DC | PRN

## 2023-07-13 MED ORDER — POLYVINYL ALCOHOL 1.4 % OP SOLN
1.0000 [drp] | OPHTHALMIC | Status: DC | PRN
  Filled 2023-07-13: qty 15

## 2023-07-13 MED ORDER — SALINE SPRAY 0.65 % NA SOLN: 1.0000 | NASAL | Status: DC | PRN

## 2023-07-13 MED ORDER — FUROSEMIDE 10 MG/ML IJ SOLN: 40.0000 mg | Freq: Two times a day (BID) | INTRAMUSCULAR | Status: DC

## 2023-07-13 MED ORDER — PHENOL 1.4 % MT LIQD
1.0000 | OROMUCOSAL | Status: DC | PRN
  Filled 2023-07-13: qty 177

## 2023-07-13 MED ORDER — HYDROCORTISONE (PERIANAL) 2.5 % EX CREA: 1.0000 | TOPICAL_CREAM | Freq: Four times a day (QID) | CUTANEOUS | Status: DC | PRN

## 2023-07-13 MED ORDER — ATORVASTATIN CALCIUM 40 MG PO TABS
40.0000 mg | ORAL_TABLET | Freq: Every evening | ORAL | Status: DC
  Administered 2023-07-13 – 2023-07-14 (×2): 40 mg via ORAL
  Filled 2023-07-13 (×2): qty 1

## 2023-07-13 NOTE — Consult Note (Addendum)
 Cardiology Consultation   Patient ID: Jacob Rios MRN: [id number]; DOB: [birth year]  Admit date: 07/13/2023 Date of Consult: 07/13/2023  PCP:  Gladis Mustard, FNP   [facility] Providers Cardiologist:  Lonni Cash, MD     Patient Profile: Jacob Rios is a 78 y.o. male with a hx of CAD s/p CABG 2000, last cath 2005 showed occluded LIMA to LAD, native LAD open, patent SVG to diagonal and patent RIMA to RCA, normal stress 2014, HTN, HLD, DM and former tobacco abuse, AAA, CVA, loop recorder in place after cryptogenic stroke, PAD,  who is being seen 07/13/2023 for the evaluation of acute CHF at the request of Dr. Claudene.  History of Present Illness: [name] has past medical history as stated above.  He presented to [facility] ED via EMS on 07/13/2023 complaining of shortness of breath.  He reported that the shortness of breath worsened with exertion.  EMS reported that the patient's SpO2 was low 90s on room air.  Relevant workup in the ED/since admission includes: EKG showed sinus rhythm, HR 99, PVCs, CBC showed hemoglobin 9.7, BMP showed creatinine 1.52 (seems around baseline), troponin negative x 2, respiratory panel negative for RSV, flu A/B, COVID, CXR showed pulmonary edema with trace bilateral effusions, CTA chest showed no PE, acute pulmonary edema, small pleural effusions, increased cardiac size since 2021, advanced aortic atherosclerosis, TSH normal, hemoglobin A1c normal, BNP elevated at 1,107  Patient was given IV Lasix 40 mg x 1, started back on his home Toprol  50 mg daily, Plavix  75 mg daily.   He was admitted to the hospitalist service for treatment of CHF, anemia, hypertension, diabetes, history of CVA, CKD stage IIIb, GERD, dyslipidemia.  Cardiology was consulted in the setting of new onset of congestive heart failure exacerbation.  Patient was last seen as an outpatient by Orren Fabry, PA-C on 03/17/2023 for follow-up.  At this appointment he reported  no shortness of breath, DOE, chest pain, edema, orthopnea, PND, palpitations.  At this time it was noted that he was having frequent PVCs, however beta-blocker limits further dosage increase due to his blood pressure being soft.  He has had a loop recorder that was placed for cryptogenic stroke, showed no arrhythmias.  Overall he was doing well, and was set to follow-up in 1 year.  BP at this point was 110/50 with HR 77.  Home meds include: Toprol  50 mg daily, lisinopril  2.5 mg daily, Plavix  75 mg daily, Lipitor 40 mg daily, omeprazole , metformin , glipizide .  After speaking with the patient, he states that around 4-5 days prior is when he began to have symptoms   Past Medical History:  Diagnosis Date   Cataract    CKD (chronic kidney disease), stage III (HCC)    followed by pcp   Coronary artery disease cardiologist--- dr cash   08/ 1999  s/p  cath w/ PTCA and stenting to RCA;   05/ 2000 inferior wall MI , 06-06-1998 s/p cabg x3;   Last heart cath 2005,  2 patent grafts (diagnol and RCA) and occluded LIMA--LAD graft with normal LAD nonobstructive disease;  last nuclear study 11/ 2014 no evidence ishcemia, ef 40%   Full dentures    Hiatal hernia    History of acute inferior wall MI 05/1998   s/p  cabg   Hypertension    Hypertensive retinopathy    Mixed hyperlipidemia    Nocturia more than twice per night    PAD (peripheral artery disease) (HCC)  left common iliac artery stenosis per aorta ultrasound 03/ 2021 in epic and cardiology note   Prostate cancer [facility]) urologist--- dr watt   first dx 07/ 2015 in active survillance until bx 11-11-2019,  Stage T2a, Gleason 3+4, PSA 13.3   S/P CABG x 3 06/06/1998   LIMA--LAD, SVG to Diagonal, RIMA to RCA   S/P primary angioplasty with coronary stent 08/1997   stent to RCA   Type 2 diabetes mellitus (HCC)    followed by pcp  (03-06-2020 checks blood sugar dialy in am,  fasting sugar-- 110-130)   Past Surgical History:  Procedure Laterality  Date   APPENDECTOMY  child   CARDIAC CATHETERIZATION  09/01/2000  @[facility]    patent RCA and Diagonal grafts, atretic LIMA, moderate nonobstructive proxLAD, normal lvsf   CARDIAC CATHETERIZATION  04-21-2003  @[facility]    LIMA--LAD graft occluded, native LAD with nonobstructive disease,  patent diagonal/ rca grafts   CATARACT EXTRACTION     CATARACT EXTRACTION W/ INTRAOCULAR LENS IMPLANT Right 12/2018   CORONARY ANGIOPLASTY WITH STENT PLACEMENT  08/1997  @[facility]    ptca w/ stenting to rca   CORONARY ARTERY BYPASS GRAFT  06-06-1998  @[facility]     LIMA -- LAD, SVG -- Diagonal,  RIMA to RCA   CYSTOSCOPY N/A 03/09/2020   Procedure: CYSTOSCOPY FLEXIBLE;  Surgeon: watt Rush, MD;  Location: [facility];  Service: Urology;  Laterality: N/A;  NO SEEDS FOUND IN BLADDER   EXCISION, MASS, UPPER EXTREMITY Right 06/04/2023   Procedure: EXCISION, MASS, UPPER EXTREMITY;  Surgeon: Kallie Manuelita BROCKS, MD;  Location: [facility];  Service: General;  Laterality: Right;  MINOR PROCEDURE ROOM   EYE SURGERY     INGUINAL HERNIA REPAIR Right 1974   LOOP RECORDER INSERTION N/A 08/30/2020   Procedure: LOOP RECORDER INSERTION;  Surgeon: Cindie Ole DASEN, MD;  Location: [facility] INVASIVE CV LAB;  Service: Cardiovascular;  Laterality: N/A;   PROSTATE BIOPSY  06/2017   x 4-5    RADIOACTIVE SEED IMPLANT N/A 03/09/2020   Procedure: RADIOACTIVE SEED IMPLANT/BRACHYTHERAPY IMPLANT;  Surgeon: watt Rush, MD;  Location: [facility];  Service: Urology;  Laterality: N/A;   70  SEEDS IMPLANTED   SPACE OAR INSTILLATION N/A 03/09/2020   Procedure: SPACE OAR INSTILLATION;  Surgeon: watt Rush, MD;  Location: [facility];  Service: Urology;  Laterality: N/A;   VENTRAL HERNIA REPAIR  09/ 2000 and recurrent repair 06/ 2001    Home Medications:  Prior to Admission medications   Medication Sig Start Date End Date Taking? Authorizing Provider  acetaminophen  (TYLENOL ) 500 MG tablet Take 1,000 mg by mouth every 4 (four)  hours as needed for moderate pain (pain score 4-6) or headache.   Yes [provider]  Ascorbic Acid (VITAMIN C PO) Take 1 tablet by mouth daily.   Yes [provider]  atorvastatin  (LIPITOR) 40 MG tablet Take 1 tablet (40 mg total) by mouth daily. Patient taking differently: Take 40 mg by mouth at bedtime. 03/17/23  Yes Conte, Tessa N, PA-C  Cholecalciferol (VITAMIN D -3 PO) Take 1 tablet by mouth daily.   Yes [provider]  clopidogrel  (PLAVIX ) 75 MG tablet Take 1 tablet (75 mg total) by mouth daily. Patient taking differently: Take 75 mg by mouth every evening. 03/17/23  Yes Conte, Tessa N, PA-C  glipiZIDE  (GLUCOTROL  XL) 10 MG 24 hr tablet Take 1 tablet (10 mg total) by mouth daily. Patient taking differently: Take 10 mg by mouth daily with breakfast. 01/20/23  Yes Gladis, [name], FNP  ibuprofen (ADVIL) 200 MG tablet Take 400 mg by mouth 2 (two) times daily as needed for headache or moderate pain (pain score 4-6).   Yes [provider]  metFORMIN  (GLUCOPHAGE ) 1000 MG tablet Take 1 tablet (1,000 mg total) by mouth 2 (two) times daily with a meal. Patient taking differently: Take 1,000 mg by mouth every evening. 01/20/23  Yes Gladis, [name], FNP  metoprolol  succinate (TOPROL -XL) 50 MG 24 hr tablet TAKE 1 TABLET BY MOUTH ONCE DAILY WITH OR IMMEDIATELY FOLLOWING A MEAL 03/17/23  Yes Conte, Tessa N, PA-C  omeprazole  (PRILOSEC) 40 MG capsule Take 1 capsule (40 mg total) by mouth daily. Patient taking differently: Take 40 mg by mouth daily after breakfast. 01/20/23  Yes Gladis, [name], FNP  glucose blood (ONETOUCH VERIO) test strip Test 1X per day and as needed  Dx 250.02 07/01/13   Gladis Mustard, FNP  Lancets [facility] ULTRASOFT) lancets Patient test 1X per day and prn  Dx 250.02 09/01/12   Gladis Mustard, FNP    Scheduled Meds:  atorvastatin   40 mg Oral QPM   clopidogrel   75 mg Oral Daily   enoxaparin  (LOVENOX ) injection  40 mg  Subcutaneous Q24H   furosemide  40 mg Intravenous BID   insulin  aspart  0-9 Units Subcutaneous TID WC   metoprolol  succinate  50 mg Oral Daily   pantoprazole   40 mg Oral Daily   sodium chloride  flush  3 mL Intravenous Q12H   Continuous Infusions:  sodium chloride      PRN Meds: sodium chloride , acetaminophen , ondansetron  (ZOFRAN ) IV, sodium chloride  flush  Allergies:    Allergies  Allergen Reactions   Ms Contin [Morphine] Shortness Of Breath and Swelling   Zetia  [Ezetimibe ] Other (See Comments)    Dizziness   Social History:   Social History   Socioeconomic History   Marital status: Married    Spouse name: Rock    Number of children: 3   Years of education: Not on file   Highest education level: Not on file  Occupational History   Occupation: part-time trucker    Comment: retired   Tobacco Use   Smoking status: Former    Current packs/day: 0.00    Average packs/day: 4.0 packs/day for 45.0 years (180.0 ttl pk-yrs)    Types: Cigarettes    Start date: 01/14/1952    Quit date: 01/13/1997    Years since quitting: 26.5   Smokeless tobacco: Never   Tobacco comments:    reports he began smoking at age 42  Vaping Use   Vaping status: Never Used  Substance and Sexual Activity   Alcohol use: No   Drug use: Never   Sexual activity: Not on file  Other Topics Concern   Not on file  Social History Narrative   Lives home with wife   Social Drivers of Health   Financial Resource Strain: Low Risk  (12/24/2022)   Overall Financial Resource Strain (CARDIA)    Difficulty of Paying Living Expenses: Not hard at all  Food Insecurity: No Food Insecurity (12/24/2022)   Hunger Vital Sign    Worried About Running Out of Food in the Last Year: Never true    Ran Out of Food in the Last Year: Never true  Transportation Needs: No Transportation Needs (12/24/2022)   PRAPARE - Administrator, Civil Service (Medical): No    Lack of Transportation (Non-Medical): No  Physical  Activity: Sufficiently Active (12/24/2022)   Exercise Vital Sign  Days of Exercise per Week: 5 days    Minutes of Exercise per Session: 30 min  Stress: No Stress Concern Present (12/24/2022)   [employer] of Occupational Health - Occupational Stress Questionnaire    Feeling of Stress : Not at all  Social Connections: Socially Integrated (12/24/2022)   Social Connection and Isolation Panel    Frequency of Communication with Friends and Family: More than three times a week    Frequency of Social Gatherings with Friends and Family: Three times a week    Attends Religious Services: More than 4 times per year    Active Member of Clubs or Organizations: Yes    Attends Banker Meetings: More than 4 times per year    Marital Status: Married  Catering manager Violence: Not At Risk (12/24/2022)   Humiliation, Afraid, Rape, and Kick questionnaire    Fear of Current or Ex-Partner: No    Emotionally Abused: No    Physically Abused: No    Sexually Abused: No    Family History:   Family History  Problem Relation Age of Onset   Lung cancer Mother    Dementia Father    Hyperlipidemia Father    Congestive Heart Failure Father    Post-traumatic stress disorder Son    Alcohol abuse Maternal Grandfather    Cancer Maternal Grandfather 90       stomach cancer    Heart attack Neg Hx    Stroke Neg Hx    Breast cancer Neg Hx    Colon cancer Neg Hx    Prostate cancer Neg Hx    Pancreatic cancer Neg Hx     ROS:  Please see the history of present illness.  All other ROS reviewed and negative.     Physical Exam/Data: Vitals:   07/13/23 1200 07/13/23 1213 07/13/23 1300 07/13/23 1302  BP: 133/80  128/81   Pulse: 94 65 (!) 57   Resp: (!) 30 15 18    Temp:    97.9 F (36.6 C)  TempSrc:    Oral  SpO2: 100% 100% 95%   Weight:      Height:       Intake/Output Summary (Last 24 hours) at 07/13/2023 1324 Last data filed at 07/13/2023 1302 Gross per 24 hour  Intake --  Output  2200 ml  Net -2200 ml      07/13/2023    5:09 AM 06/18/2023   12:55 PM 06/04/2023    8:10 AM  Last 3 Weights  Weight (lbs) 205 lb 208 lb 205 lb  Weight (kg) 92.987 kg 94.348 kg 92.987 kg     Body mass index is 28.19 kg/m.   General:  Well nourished, well developed, in no acute distress, currently on HFNC HEENT: normal Vascular:  Distal pulses 2+ bilaterally Cardiac:  normal S1, S2; RRR; no murmur Lungs:  bilateral crackles, mild wheezing present   Abd: soft, nontender, no hepatomegaly  Ext: no edema Musculoskeletal:  No deformities, BUE and BLE strength normal and equal Skin: warm and dry  Neuro:  CNs 2-12 intact, no focal abnormalities noted Psych:  Normal affect   EKG:  The EKG was personally reviewed and demonstrates:  sinus rhythm, HR 99, PVCs  Telemetry:  Telemetry was personally reviewed and demonstrates:  sinus rhythm, HR 80s, frequent PVCs  Relevant CV Studies:  Echocardiogram, 07/13/2023 Ordered, pending results   Remote device check, ILR 07/08/2023 ILR summary report received. Battery status OK.  Normal device function.  No new symptom,  tachy, brady, or pause episodes.  No new AF episodes   Laboratory Data: High Sensitivity Troponin:   Recent Labs  Lab 07/13/23 0513 07/13/23 0731  TROPONINIHS 10 12     Chemistry Recent Labs  Lab 07/13/23 0513 07/13/23 0525  NA 139 141  K 4.3 4.4  CL 109 110  CO2 20*  --   GLUCOSE 107* 103*  BUN 18 18  CREATININE 1.52* 1.50*  CALCIUM  8.4*  --   GFRNONAA 47*  --   ANIONGAP 10  --     No results for input(s): PROT, ALBUMIN, AST, ALT, ALKPHOS, BILITOT in the last 168 hours. Lipids No results for input(s): CHOL, TRIG, HDL, LABVLDL, LDLCALC, CHOLHDL in the last 168 hours.  Hematology Recent Labs  Lab 07/13/23 0513 07/13/23 0525  WBC 5.2  --   RBC 3.70*  --   HGB 9.7* 10.2*  HCT 31.5* 30.0*  MCV 85.1  --   MCH 26.2  --   MCHC 30.8  --   RDW 16.3*  --   PLT 218  --    Thyroid    Recent Labs  Lab 07/13/23 0731  TSH 1.354    BNP Recent Labs  Lab 07/13/23 0805  BNP 1,107.6*    DDimer No results for input(s): DDIMER in the last 168 hours.  Radiology/Studies:  CT Angio Chest PE W and/or Wo Contrast Result Date: 07/13/2023 CLINICAL DATA:  78 year old male with shortness of breath. EXAM: CT ANGIOGRAPHY CHEST WITH CONTRAST TECHNIQUE: Multidetector CT imaging of the chest was performed using the standard protocol during bolus administration of intravenous contrast. Multiplanar CT image reconstructions and MIPs were obtained to evaluate the vascular anatomy. RADIATION DOSE REDUCTION: This exam was performed according to the departmental dose-optimization program which includes automated exposure control, adjustment of the mA and/or kV according to patient size and/or use of iterative reconstruction technique. CONTRAST:  75mL OMNIPAQUE  IOHEXOL  350 MG/ML SOLN COMPARISON:  Portable chest 0529 hours today. CT Abdomen and Pelvis 12/19/2019. FINDINGS: Cardiovascular: Good contrast bolus timing in the pulmonary arterial tree. Mild upper and mild to moderate lower chest respiratory motion. No pulmonary artery filling defect is identified. Extensive Calcified aortic atherosclerosis. Prior CABG. Mild cardiomegaly, new since 2021. No pericardial effusion. No contrast in the aorta. Calcified coronary artery atherosclerosis *CRASH* that Mediastinum/Nodes: Postoperative changes. Reactive appearing mediastinal lymph nodes up to 11 mm short axis. Smaller hilar lymph nodes. No discrete mediastinal mass. Lungs/Pleura: Small bilateral pleural effusions are layering and tracking into the major fissures. Fairly simple fluid density. Respiratory motion, major airways remain patent. Generalized pulmonary septal thickening. Additional bilateral pulmonary ground-glass and streaky peribronchial opacity. No air bronchograms. Upper Abdomen: Negative visible noncontrast liver, gallbladder, spleen, pancreas,  adrenal glands and bowel. Musculoskeletal: Previous sternotomy. Chronic severe T7-T8 disc and endplate degeneration with evidence of developing interbody ankylosis at that level. Chronic L1 compression fracture is stable since 2021. No acute or suspicious osseous lesion identified. Review of the MIP images confirms the above findings. IMPRESSION: 1. Good contrast timing with respiratory motion artifact. No pulmonary embolus identified. 2. Constellation of Acute Pulmonary Edema, small pleural effusions. 3. Increased cardiac size since 2021, prior CABG, advanced Aortic Atherosclerosis (ICD10-I70.0). Electronically Signed   By: VEAR Hurst M.D.   On: 07/13/2023 06:35   DG Chest Portable 1 View Result Date: 07/13/2023 CLINICAL DATA:  SOB EXAM: PORTABLE CHEST 1 VIEW COMPARISON:  Chest x-ray 02/02/2020 FINDINGS: The heart and mediastinal contours are unchanged. Atherosclerotic plaque. No focal consolidation. Chronic coarsened interstitial  markings with superimposed pulmonary edema. Bilateral trace pleural effusion. No pneumothorax. No acute osseous abnormality.  Intact sternotomy wires. IMPRESSION: Pulmonary edema with bilateral trace pleural effusions. Electronically Signed   By: Morgane  Naveau M.D.   On: 07/13/2023 05:40   Assessment and Plan:  Acute CHF exacerbation Hypertension  Presented with shortness of breath, LE edema, orthopnea BNP 1,107 CXR/CTPA showed cardiac enlargement when compared to 2021, pulmonary edema, pleural effusions bilaterally Echo 08/2020: LVEF 50 to 55%, moderate LVH, normal RV, mildly dilated LA, mild AS, IVC normal Given IV Lasix 40 mg x 1 dose in ED Agree with another dose of IV Lasix 40 mg today, monitor response and re-dose as needed Pending updated echocardiogram Continue to monitor strict I&O's, daily weights, daily BMPs Continue PTA Toprol  50 mg daily   CAD s/p CABG and PCI  PAD Hyperlipidemia  01/20/2023: ALT 13; HDL 31; LDL Chol Calc (NIH) 65  Troponin negative x  2 Stress myoview  from 11/2012 showed no ischemia  Continue PTA Plavix  75 mg daily, Lipitor 40 mg daily   Per primary Anemia Type 2 diabetes  CKD stage 3b GERD History of CVA  History of prostate cancer AAA  Risk Assessment/Risk Scores:     [place]  Heart Association (NYHA) Functional Class NYHA Class II   For questions or updates, please contact [facility] Please consult [email or web address] for contact info under    Signed, Waddell DELENA Donath, PA-C  07/13/2023 1:24 PM  I have personally seen and examined this patient. I agree with the assessment and plan as outlined above.  78 yo male with history of CAD s/p CABG, HTN, HLD, DM, prior CVA admitted with dyspnea and found to have volume overload.  BNP elevated.  Chest CTA with evidence of pulmonary edema and pleural effusions but no PE Troponin negative.  No ischemic EKG changes Pt with no chest pain.  Feels better after one dose of IV Lasix.  Labs reviewed by me EKG reviewed by me and shows sinus with PVCs Telemetry with sinus and PVCs My exam: NAD  RRR with ectopy LUngs: basilar crackles Ext: no LE edema Plan:  CAD s/p CABG without angina: Dyspnea does not appear to be related to his CAD No evidence of ACS Acute on chronic diastolic CHF: Evidence of pulm edema likely related to diastolic dysfunction. Agree with IV Lasix. Echo later today.  Can wean supplemental O2 off  Lonni Cash, MD, FACC 07/13/2023 1:39 PM

## 2023-07-13 NOTE — ED Triage Notes (Signed)
 Patient BIB [facility] by EMS from home due to SOB. Patient states SOB worsens with exertion. Denies CP. Patient has hx of triple bypass and stents. EMS reports low 90s on room air, 98% with 4L. Patient is A&Ox4, VSS.

## 2023-07-13 NOTE — ED Provider Notes (Signed)
 [place] EMERGENCY DEPARTMENT AT [facility] Provider Note   CSN: [id number] Arrival date & time: 07/13/23  9494     Patient presents with: Shortness of Breath   Jacob Rios is a 78 y.o. male.   The history is provided by the patient.  Shortness of Breath Severity:  Severe Onset quality:  Gradual Timing:  Constant Progression:  Worsening Chronicity:  New Context: not URI   Relieved by:  Nothing Worsened by:  Nothing Ineffective treatments:  None tried Associated symptoms: wheezing   Associated symptoms: no chest pain and no fever   Risk factors: no tobacco use   Patient with CAD and HTN s/p CABG presents with SOB and DOE.  No CP.  No n/v/d.      Past Medical History:  Diagnosis Date   Cataract    CKD (chronic kidney disease), stage III (HCC)    followed by pcp   Coronary artery disease cardiologist--- dr verlin   08/ 1999  s/p  cath w/ PTCA and stenting to RCA;   05/ 2000 inferior wall MI , 06-06-1998 s/p cabg x3;   Last heart cath 2005,  2 patent grafts (diagnol and RCA) and occluded LIMA--LAD graft with normal LAD nonobstructive disease;  last nuclear study 11/ 2014 no evidence ishcemia, ef 40%   Full dentures    Hiatal hernia    History of acute inferior wall MI 05/1998   s/p  cabg   Hypertension    Hypertensive retinopathy    Mixed hyperlipidemia    Nocturia more than twice per night    PAD (peripheral artery disease) (HCC)    left common iliac artery stenosis per aorta ultrasound 03/ 2021 in epic and cardiology note   Prostate cancer [facility]) urologist--- dr watt   first dx 07/ 2015 in active survillance until bx 11-11-2019,  Stage T2a, Gleason 3+4, PSA 13.3   S/P CABG x 3 06/06/1998   LIMA--LAD, SVG to Diagonal, RIMA to RCA   S/P primary angioplasty with coronary stent 08/1997   stent to RCA   Type 2 diabetes mellitus (HCC)    followed by pcp  (03-06-2020 checks blood sugar dialy in am,  fasting sugar-- 110-130)     Prior to Admission  medications   Medication Sig Start Date End Date Taking? Authorizing Provider  atorvastatin  (LIPITOR) 40 MG tablet Take 1 tablet (40 mg total) by mouth daily. 03/17/23   Lucien Orren SAILOR, PA-C  Cholecalciferol (D3 ADULT PO) Take 1 tablet by mouth daily.    [provider]  clopidogrel  (PLAVIX ) 75 MG tablet Take 1 tablet (75 mg total) by mouth daily. 03/17/23   Lucien Orren SAILOR, PA-C  glipiZIDE  (GLUCOTROL  XL) 10 MG 24 hr tablet Take 1 tablet (10 mg total) by mouth daily. 01/20/23   Gladis Mustard, FNP  glucose blood (ONETOUCH VERIO) test strip Test 1X per day and as needed  Dx 250.02 07/01/13   Gladis Mustard, FNP  Lancets [facility] ULTRASOFT) lancets Patient test 1X per day and prn  Dx 250.02 09/01/12   Gladis Mustard, FNP  metFORMIN  (GLUCOPHAGE ) 1000 MG tablet Take 1 tablet (1,000 mg total) by mouth 2 (two) times daily with a meal. 01/20/23   Gladis Mustard, FNP  metoprolol  succinate (TOPROL -XL) 50 MG 24 hr tablet TAKE 1 TABLET BY MOUTH ONCE DAILY WITH OR IMMEDIATELY FOLLOWING A MEAL 03/17/23   Conte, Tessa N, PA-C  omeprazole  (PRILOSEC) 40 MG capsule Take 1 capsule (40 mg total) by mouth daily. 01/20/23  Gladis, [name], FNP  vitamin C (ASCORBIC ACID) 500 MG tablet Take 500 mg by mouth daily.    [provider]    Allergies: Morphine    Review of Systems  Constitutional:  Negative for fever.  Respiratory:  Positive for shortness of breath and wheezing.   Cardiovascular:  Negative for chest pain.  All other systems reviewed and are negative.   Updated Vital Signs BP (!) 140/66   Pulse 69   Temp 97.8 F (36.6 C) (Oral)   Resp 19   Ht 5' 11.5 (1.816 m)   Wt 93 kg   SpO2 100%   BMI 28.19 kg/m   Physical Exam Vitals and nursing note reviewed.  Constitutional:      General: He is not in acute distress.    Appearance: Normal appearance. He is well-developed. He is not diaphoretic.  HENT:     Head: Normocephalic and atraumatic.     Nose: Nose  normal.   Eyes:     Conjunctiva/sclera: Conjunctivae normal.     Pupils: Pupils are equal, round, and reactive to light.    Cardiovascular:     Rate and Rhythm: Normal rate and regular rhythm.     Pulses: Normal pulses.     Heart sounds: Normal heart sounds.  Pulmonary:     Effort: Pulmonary effort is normal. Tachypnea present.     Breath sounds: Wheezing and rales present.  Abdominal:     General: Bowel sounds are normal.     Palpations: Abdomen is soft.     Tenderness: There is no abdominal tenderness. There is no guarding or rebound.   Musculoskeletal:        General: Normal range of motion.     Cervical back: Normal range of motion and neck supple.     Right lower leg: Edema present.     Left lower leg: Edema present.     Comments: Trace    Skin:    General: Skin is warm and dry.     Capillary Refill: Capillary refill takes less than 2 seconds.   Neurological:     General: No focal deficit present.     Mental Status: He is alert and oriented to person, place, and time.     Deep Tendon Reflexes: Reflexes normal.   Psychiatric:        Mood and Affect: Mood normal.     (all labs ordered are listed, but only abnormal results are displayed) Results for orders placed or performed during the hospital encounter of 07/13/23  Basic metabolic panel   Collection Time: 07/13/23  5:13 AM  Result Value Ref Range   Sodium 139 135 - 145 mmol/L   Potassium 4.3 3.5 - 5.1 mmol/L   Chloride 109 98 - 111 mmol/L   CO2 20 (L) 22 - 32 mmol/L   Glucose, Bld 107 (H) 70 - 99 mg/dL   BUN 18 8 - 23 mg/dL   Creatinine, Ser 8.47 (H) 0.61 - 1.24 mg/dL   Calcium  8.4 (L) 8.9 - 10.3 mg/dL   GFR, Estimated 47 (L) >60 mL/min   Anion gap 10 5 - 15  CBC   Collection Time: 07/13/23  5:13 AM  Result Value Ref Range   WBC 5.2 4.0 - 10.5 K/uL   RBC 3.70 (L) 4.22 - 5.81 MIL/uL   Hemoglobin 9.7 (L) 13.0 - 17.0 g/dL   HCT 68.4 (L) 60.9 - 47.9 %   MCV 85.1 80.0 - 100.0 fL   MCH 26.2  26.0 - 34.0 pg    MCHC 30.8 30.0 - 36.0 g/dL   RDW 83.6 (H) 88.4 - 84.4 %   Platelets 218 150 - 400 K/uL   nRBC 0.0 0.0 - 0.2 %  Protime-INR   Collection Time: 07/13/23  5:13 AM  Result Value Ref Range   Prothrombin Time 15.3 (H) 11.4 - 15.2 seconds   INR 1.1 0.8 - 1.2  Troponin I (High Sensitivity)   Collection Time: 07/13/23  5:13 AM  Result Value Ref Range   Troponin I (High Sensitivity) 10 <18 ng/L  Resp panel by RT-PCR (RSV, Flu A&B, Covid) Anterior Nasal Swab   Collection Time: 07/13/23  5:21 AM   Specimen: Anterior Nasal Swab  Result Value Ref Range   SARS Coronavirus 2 by RT PCR NEGATIVE NEGATIVE   Influenza A by PCR NEGATIVE NEGATIVE   Influenza B by PCR NEGATIVE NEGATIVE   Resp Syncytial Virus by PCR NEGATIVE NEGATIVE  I-stat chem 8, ED (not at [facility], [facility] or [facility])   Collection Time: 07/13/23  5:25 AM  Result Value Ref Range   Sodium 141 135 - 145 mmol/L   Potassium 4.4 3.5 - 5.1 mmol/L   Chloride 110 98 - 111 mmol/L   BUN 18 8 - 23 mg/dL   Creatinine, Ser 8.49 (H) 0.61 - 1.24 mg/dL   Glucose, Bld 896 (H) 70 - 99 mg/dL   Calcium , Ion 1.13 (L) 1.15 - 1.40 mmol/L   TCO2 20 (L) 22 - 32 mmol/L   Hemoglobin 10.2 (L) 13.0 - 17.0 g/dL   HCT 69.9 (L) 60.9 - 47.9 %   CT Angio Chest PE W and/or Wo Contrast Result Date: 07/13/2023 CLINICAL DATA:  78 year old male with shortness of breath. EXAM: CT ANGIOGRAPHY CHEST WITH CONTRAST TECHNIQUE: Multidetector CT imaging of the chest was performed using the standard protocol during bolus administration of intravenous contrast. Multiplanar CT image reconstructions and MIPs were obtained to evaluate the vascular anatomy. RADIATION DOSE REDUCTION: This exam was performed according to the departmental dose-optimization program which includes automated exposure control, adjustment of the mA and/or kV according to patient size and/or use of iterative reconstruction technique. CONTRAST:  75mL OMNIPAQUE  IOHEXOL  350 MG/ML SOLN COMPARISON:  Portable chest 0529 hours  today. CT Abdomen and Pelvis 12/19/2019. FINDINGS: Cardiovascular: Good contrast bolus timing in the pulmonary arterial tree. Mild upper and mild to moderate lower chest respiratory motion. No pulmonary artery filling defect is identified. Extensive Calcified aortic atherosclerosis. Prior CABG. Mild cardiomegaly, new since 2021. No pericardial effusion. No contrast in the aorta. Calcified coronary artery atherosclerosis *CRASH* that Mediastinum/Nodes: Postoperative changes. Reactive appearing mediastinal lymph nodes up to 11 mm short axis. Smaller hilar lymph nodes. No discrete mediastinal mass. Lungs/Pleura: Small bilateral pleural effusions are layering and tracking into the major fissures. Fairly simple fluid density. Respiratory motion, major airways remain patent. Generalized pulmonary septal thickening. Additional bilateral pulmonary ground-glass and streaky peribronchial opacity. No air bronchograms. Upper Abdomen: Negative visible noncontrast liver, gallbladder, spleen, pancreas, adrenal glands and bowel. Musculoskeletal: Previous sternotomy. Chronic severe T7-T8 disc and endplate degeneration with evidence of developing interbody ankylosis at that level. Chronic L1 compression fracture is stable since 2021. No acute or suspicious osseous lesion identified. Review of the MIP images confirms the above findings. IMPRESSION: 1. Good contrast timing with respiratory motion artifact. No pulmonary embolus identified. 2. Constellation of Acute Pulmonary Edema, small pleural effusions. 3. Increased cardiac size since 2021, prior CABG, advanced Aortic Atherosclerosis (ICD10-I70.0). Electronically Signed   By: VEAR  Shona M.D.   On: 07/13/2023 06:35   DG Chest Portable 1 View Result Date: 07/13/2023 CLINICAL DATA:  SOB EXAM: PORTABLE CHEST 1 VIEW COMPARISON:  Chest x-ray 02/02/2020 FINDINGS: The heart and mediastinal contours are unchanged. Atherosclerotic plaque. No focal consolidation. Chronic coarsened interstitial  markings with superimposed pulmonary edema. Bilateral trace pleural effusion. No pneumothorax. No acute osseous abnormality.  Intact sternotomy wires. IMPRESSION: Pulmonary edema with bilateral trace pleural effusions. Electronically Signed   By: Morgane  Naveau M.D.   On: 07/13/2023 05:40   CUP PACEART REMOTE DEVICE CHECK Result Date: 07/09/2023 ILR summary report received. Battery status OK. Normal device function. No new symptom, tachy, brady, or pause episodes. No new AF episodes. Monthly summary reports and ROV/PRN ML, CVRS   EKG:  Date: 07/13/2023  Rate: 99  Rhythm: normal sinus rhythm  QRS Axis: normal  Intervals: normal  ST/T Wave abnormalities: normal  Conduction Disutrbances: paired PVCs  Narrative Interpretation: paired PVCs     Radiology: CT Angio Chest PE W and/or Wo Contrast Result Date: 07/13/2023 CLINICAL DATA:  77 year old male with shortness of breath. EXAM: CT ANGIOGRAPHY CHEST WITH CONTRAST TECHNIQUE: Multidetector CT imaging of the chest was performed using the standard protocol during bolus administration of intravenous contrast. Multiplanar CT image reconstructions and MIPs were obtained to evaluate the vascular anatomy. RADIATION DOSE REDUCTION: This exam was performed according to the departmental dose-optimization program which includes automated exposure control, adjustment of the mA and/or kV according to patient size and/or use of iterative reconstruction technique. CONTRAST:  75mL OMNIPAQUE  IOHEXOL  350 MG/ML SOLN COMPARISON:  Portable chest 0529 hours today. CT Abdomen and Pelvis 12/19/2019. FINDINGS: Cardiovascular: Good contrast bolus timing in the pulmonary arterial tree. Mild upper and mild to moderate lower chest respiratory motion. No pulmonary artery filling defect is identified. Extensive Calcified aortic atherosclerosis. Prior CABG. Mild cardiomegaly, new since 2021. No pericardial effusion. No contrast in the aorta. Calcified coronary artery  atherosclerosis *CRASH* that Mediastinum/Nodes: Postoperative changes. Reactive appearing mediastinal lymph nodes up to 11 mm short axis. Smaller hilar lymph nodes. No discrete mediastinal mass. Lungs/Pleura: Small bilateral pleural effusions are layering and tracking into the major fissures. Fairly simple fluid density. Respiratory motion, major airways remain patent. Generalized pulmonary septal thickening. Additional bilateral pulmonary ground-glass and streaky peribronchial opacity. No air bronchograms. Upper Abdomen: Negative visible noncontrast liver, gallbladder, spleen, pancreas, adrenal glands and bowel. Musculoskeletal: Previous sternotomy. Chronic severe T7-T8 disc and endplate degeneration with evidence of developing interbody ankylosis at that level. Chronic L1 compression fracture is stable since 2021. No acute or suspicious osseous lesion identified. Review of the MIP images confirms the above findings. IMPRESSION: 1. Good contrast timing with respiratory motion artifact. No pulmonary embolus identified. 2. Constellation of Acute Pulmonary Edema, small pleural effusions. 3. Increased cardiac size since 2021, prior CABG, advanced Aortic Atherosclerosis (ICD10-I70.0). Electronically Signed   By: VEAR Shona M.D.   On: 07/13/2023 06:35   DG Chest Portable 1 View Result Date: 07/13/2023 CLINICAL DATA:  SOB EXAM: PORTABLE CHEST 1 VIEW COMPARISON:  Chest x-ray 02/02/2020 FINDINGS: The heart and mediastinal contours are unchanged. Atherosclerotic plaque. No focal consolidation. Chronic coarsened interstitial markings with superimposed pulmonary edema. Bilateral trace pleural effusion. No pneumothorax. No acute osseous abnormality.  Intact sternotomy wires. IMPRESSION: Pulmonary edema with bilateral trace pleural effusions. Electronically Signed   By: Morgane  Naveau M.D.   On: 07/13/2023 05:40     Procedures   Medications Ordered in the ED  furosemide (LASIX) injection 40 mg (has no  administration in  time range)  iohexol  (OMNIPAQUE ) 350 MG/ML injection 75 mL (75 mLs Intravenous Contrast Given 07/13/23 9371)                                    Medical Decision Making Patient with SOB, worse with exertion NO CP  Amount and/or Complexity of Data Reviewed Independent Historian: EMS    Details: See above  External Data Reviewed: notes.    Details: Previous notes reviewed  Labs: ordered.    Details: Negative covid and flu.  Troponin is normal 10.  Normal sodium 139, normal potassium 4.3, elevated creatinine 1.52.  Normal white count 5.2, anemia 9.7, normal platelets.   Radiology: ordered and independent interpretation performed.    Details: CF by me on CXR ECG/medicine tests: ordered and independent interpretation performed. Decision-making details documented in ED Course.  Risk Prescription drug management. Risk Details: SOb with o2 requirement and new CHF needs admission      Final diagnoses:  Acute congestive heart failure, unspecified heart failure type (HCC)  Pleural effusion   The patient appears reasonably stabilized for admission considering the current resources, flow, and capabilities available in the ED at this time, and I doubt any other [facility] requiring further screening and/or treatment in the ED prior to admission.  ED Discharge Orders     None          Kj Imbert, MD 07/13/23 [phone]

## 2023-07-13 NOTE — ED Notes (Signed)
 Patient upset due to temperature in room. Thermostat is hanging off the wall. Patient laying on approx 4 blankets not covered up. Provided 3 more warm blankets to patient.

## 2023-07-13 NOTE — H&P (Signed)
 History and Physical    Patient: Jacob Rios FMW:[id number] DOB: 1945-04-13 DOA: 07/13/2023 DOS: the patient was seen and examined on 07/13/2023 PCP: Gladis Mustard, FNP  Patient coming from: Home via EMS  Chief Complaint:  Chief Complaint  Patient presents with   Shortness of Breath   HPI: Jacob Rios is a 78 y.o. male with medical history significant of hypertension, hyperlipidemia, CAD s/p PCI and CABG,CVA, s/p loop recorder, DM type II, PAD, CKD stage III, history of lung cancer, prostate cancer, and history of skin cancerpresents with shortness of breath.  He has been experiencing shortness of breath for the past five days. Initially, the symptoms were intermittent and he felt like they improved with activity, such as being outdoors or engaging in tasks like riding a lawnmower. Over the last three days, the shortness of breath has worsened, particularly when lying down, causing a sensation of suffocation and inability to breathe comfortably.  He reports expectorating green-yellow sputum and congestion. No cough, leg swelling, or calf pain. He has not experienced fevers, chills, or palpitations.  His past medical history includes coronary artery disease with stents placed in 1999 and bypass surgery in 2000, and a stroke in 2022 resulting in approximately 40% vision loss in the left eye.   In the ED patient was noted to be afebrile with mild tachypnea, blood pressures elevated up to 151/86, and O2 saturations maintained currently on 2 L nasal cannula oxygen.  Labs significant for hemoglobin 9.7, BUN 18, creatinine 1.52, calcium  8.4, and high-sensitivity troponin 10.  Chest x-ray showed pulmonary edema with bilateral trace pleural effusions..  CTA of the chest noted no pulmonary embolism and acute pulmonary edema with small pleural effusions and increased cardiac size since 2021.  Patient had been given Lasix 40 mg IV.  Review of Systems: As mentioned in the history of present  illness. All other systems reviewed and are negative. Past Medical History:  Diagnosis Date   Cataract    CKD (chronic kidney disease), stage III (HCC)    followed by pcp   Coronary artery disease cardiologist--- dr verlin   08/ 1999  s/p  cath w/ PTCA and stenting to RCA;   05/ 2000 inferior wall MI , 06-06-1998 s/p cabg x3;   Last heart cath 2005,  2 patent grafts (diagnol and RCA) and occluded LIMA--LAD graft with normal LAD nonobstructive disease;  last nuclear study 11/ 2014 no evidence ishcemia, ef 40%   Full dentures    Hiatal hernia    History of acute inferior wall MI 05/1998   s/p  cabg   Hypertension    Hypertensive retinopathy    Mixed hyperlipidemia    Nocturia more than twice per night    PAD (peripheral artery disease) (HCC)    left common iliac artery stenosis per aorta ultrasound 03/ 2021 in epic and cardiology note   Prostate cancer [facility]) urologist--- dr watt   first dx 07/ 2015 in active survillance until bx 11-11-2019,  Stage T2a, Gleason 3+4, PSA 13.3   S/P CABG x 3 06/06/1998   LIMA--LAD, SVG to Diagonal, RIMA to RCA   S/P primary angioplasty with coronary stent 08/1997   stent to RCA   Type 2 diabetes mellitus (HCC)    followed by pcp  (03-06-2020 checks blood sugar dialy in am,  fasting sugar-- 110-130)   Past Surgical History:  Procedure Laterality Date   APPENDECTOMY  child   CARDIAC CATHETERIZATION  09/01/2000  @[facility]    patent RCA  and Diagonal grafts, atretic LIMA, moderate nonobstructive proxLAD, normal lvsf   CARDIAC CATHETERIZATION  04-21-2003  @[facility]    LIMA--LAD graft occluded, native LAD with nonobstructive disease,  patent diagonal/ rca grafts   CATARACT EXTRACTION     CATARACT EXTRACTION W/ INTRAOCULAR LENS IMPLANT Right 12/2018   CORONARY ANGIOPLASTY WITH STENT PLACEMENT  08/1997  @[facility]    ptca w/ stenting to rca   CORONARY ARTERY BYPASS GRAFT  06-06-1998  @[facility]     LIMA -- LAD, SVG -- Diagonal,  RIMA to RCA   CYSTOSCOPY N/A 03/09/2020   Procedure:  CYSTOSCOPY FLEXIBLE;  Surgeon: Watt Rush, MD;  Location: [facility];  Service: Urology;  Laterality: N/A;  NO SEEDS FOUND IN BLADDER   EXCISION, MASS, UPPER EXTREMITY Right 06/04/2023   Procedure: EXCISION, MASS, UPPER EXTREMITY;  Surgeon: Kallie Manuelita BROCKS, MD;  Location: [facility];  Service: General;  Laterality: Right;  MINOR PROCEDURE ROOM   EYE SURGERY     INGUINAL HERNIA REPAIR Right 1974   LOOP RECORDER INSERTION N/A 08/30/2020   Procedure: LOOP RECORDER INSERTION;  Surgeon: Cindie Ole DASEN, MD;  Location: [facility] IN[facility]ASIVE CV [facility];  Service: Cardiovascular;  Laterality: N/A;   PROSTATE BIOPSY  06/2017   x 4-5    RADIOACTIVE SEED IMPLANT N/A 03/09/2020   Procedure: RADIOACTIVE SEED IMPLANT/BRACHYTHERAPY IMPLANT;  Surgeon: Watt Rush, MD;  Location: [facility];  Service: Urology;  Laterality: N/A;   70  SEEDS IMPLANTED   SPACE OAR INSTILLATION N/A 03/09/2020   Procedure: SPACE OAR INSTILLATION;  Surgeon: Watt Rush, MD;  Location: [facility];  Service: Urology;  Laterality: N/A;   VENTRAL HERNIA REPAIR  09/ 2000 and recurrent repair 06/ 2001   Social History:  reports that he quit smoking about 26 years ago. His smoking use included cigarettes. He started smoking about 71 years ago. He has a 180 pack-year smoking history. He has never used smokeless tobacco. He reports that he does not drink alcohol and does not use drugs.  Allergies  Allergen Reactions   Morphine Shortness Of Breath and Swelling    Family History  Problem Relation Age of Onset   Lung cancer Mother    Dementia Father    Hyperlipidemia Father    Congestive Heart Failure Father    Post-traumatic stress disorder Son    Alcohol abuse Maternal Grandfather    Cancer Maternal Grandfather 42       stomach cancer    Heart attack Neg Hx    Stroke Neg Hx    Breast cancer Neg Hx    Colon cancer Neg Hx    Prostate cancer Neg Hx    Pancreatic cancer Neg Hx     Prior to  Admission medications   Medication Sig Start Date End Date Taking? Authorizing Provider  atorvastatin  (LIPITOR) 40 MG tablet Take 1 tablet (40 mg total) by mouth daily. 03/17/23   Lucien Orren SAILOR, PA-C  Cholecalciferol (D3 ADULT PO) Take 1 tablet by mouth daily.    [provider]  clopidogrel  (PLAVIX ) 75 MG tablet Take 1 tablet (75 mg total) by mouth daily. 03/17/23   Lucien Orren SAILOR, PA-C  glipiZIDE  (GLUCOTROL  XL) 10 MG 24 hr tablet Take 1 tablet (10 mg total) by mouth daily. 01/20/23   Gladis [name], FNP  glucose blood (ONETOUCH VERIO) test strip Test 1X per day and as needed  Dx 250.02 07/01/13   Gladis Mustard, FNP  Lancets [facility] ULTRASOFT) lancets Patient test 1X per day  and prn  Dx 250.02 09/01/12   Gladis Mustard, FNP  metFORMIN  (GLUCOPHAGE ) 1000 MG tablet Take 1 tablet (1,000 mg total) by mouth 2 (two) times daily with a meal. 01/20/23   Gladis, [name], FNP  metoprolol  succinate (TOPROL -XL) 50 MG 24 hr tablet TAKE 1 TABLET BY MOUTH ONCE DAILY WITH OR IMMEDIATELY FOLLOWING A MEAL 03/17/23   Conte, Tessa N, PA-C  omeprazole  (PRILOSEC) 40 MG capsule Take 1 capsule (40 mg total) by mouth daily. 01/20/23   Gladis [name], FNP  vitamin C (ASCORBIC ACID) 500 MG tablet Take 500 mg by mouth daily.    [provider]    Physical Exam: Vitals:   07/13/23 0506 07/13/23 0509 07/13/23 0630  BP:  (!) 151/86 (!) 140/66  Pulse:  85 69  Resp:  (!) 26 19  Temp:  97.8 F (36.6 C)   TempSrc:  Oral   SpO2: 98% 100% 100%  Weight:  93 kg   Height:  5' 11.5 (1.816 m)    Constitutional: Male currently in no acute distress Eyes: PERRL, lids and conjunctivae normal ENMT: Mucous membranes are moist.  Normal dentition.  Neck: normal, supple,   Respiratory: Normal respiratory effort with crackles appreciated in both lung fields.  No significant wheezes or rhonchi appreciated.  Patient able to talk in complete sentences currently on 2 L of nasal cannula  oxygen. Cardiovascular: Regular rate and rhythm, no murmurs / rubs / gallops. No extremity edema.   Abdomen: no tenderness, no masses palpated. Bowel sounds positive.  Musculoskeletal: no clubbing / cyanosis. No joint deformity upper and lower extremities. Good ROM, no contractures. Normal muscle tone.  Skin: no rashes, lesions, ulcers. Neurologic: CN 2-12 grossly intact. Sensation intact, DTR normal. Strength 5/5 in all 4.  Psychiatric: Normal judgment and insight. Alert and oriented x 3. Normal mood.   Data Reviewed:  EKG reveals sinus rhythm at 99 bpm with paired PVCs.  Reviewed labs, imaging, and pertinent records as documented  Assessment and Plan:  New onset congestive heart failure exacerbation Patient presents with complaints of shortness of breath.  O2 saturations noted to be in the low 90s on room air which patient was placed on nasal cannula oxygen.  Last echocardiogram noted EF to be 50 to 55% with normal diastolic parameters back in 2022.Patient loop recorder was recently interrogated on 6/26 and showed no heart arrhythmias. - Admit to a cardiac telemetry bed - Heart failure order set utilized - Strict I&O's and daily weights - Check BNP (1107) and  TSH - Check echocardiogram - Lasix 40 mg IV twice daily.  Reassess daily and adjust diuresis as deemed medically appropriate - Continue beta-blocker - Cardiology consulted,  will follow-up for any further recommendations  Normocytic anemia Acute.  Hemoglobin noted to be 9.7 on admission with a baseline normally around 11-13. - Recheck H&H  Essential hypertension Blood pressures noted to be elevated up to 151/86. - Continue metoprolol   Controlled diabetes mellitus type 2, without long-term use of insulin  Last available hemoglobin A1c from 2022 was 6.5. - Hypoglycemic protocols - Check hemoglobin A1c - Hold glipizide  - CBGs before every meal with sensitive SSI.  Adjust regimen as deemed medically  appropriate  CAD PAD Patient with prior history of PCI and CABG2022 - Continue Plavix  and atorvastatin   Dyslipidemia Last lipid panel from 01/20/2023 noted total cholesterol 115, HDL 31, triglycerides 103, and LDL 65. - Continue atorvastatin   History of CVA with residual deficit Patient with prior history of CVA back in 2022  with reported 40% vision loss out of his left eye. - Continue Plavix  and statin  CKD stage IIIb Creatinine 1.52 which appears around patient's baseline. - Monitor BMP daily  History of skin cancer Patient made noted that he just recently had lesion removed off his right forearm. - Continue patient follow-up with dermatology   GERD - Continue pharmacy substitution for Prilosec  DVT prophylaxis: Lovenox  Advance Care Planning:   Code Status: Full Code   Consults: Cardiology  Family Communication: Family updated at bedside  Severity of Illness: The appropriate patient status for this patient is INPATIENT. Inpatient status is judged to be reasonable and necessary in order to provide the required intensity of service to ensure the patient's safety. The patient's presenting symptoms, physical exam findings, and initial radiographic and laboratory data in the context of their chronic comorbidities is felt to place them at high risk for further clinical deterioration. Furthermore, it is not anticipated that the patient will be medically stable for discharge from the hospital within 2 midnights of admission.   * I certify that at the point of admission it is my clinical judgment that the patient will require inpatient hospital care spanning beyond 2 midnights from the point of admission due to high intensity of service, high risk for further deterioration and high frequency of surveillance required.*  Author: Maximino DELENA Sharps, MD 07/13/2023 7:14 AM  For on call review [email or web address].

## 2023-07-13 NOTE — ED Notes (Signed)
 MD at bedside.

## 2023-07-14 ENCOUNTER — Other Ambulatory Visit (HOSPITAL_COMMUNITY): Payer: Self-pay

## 2023-07-14 ENCOUNTER — Inpatient Hospital Stay (HOSPITAL_COMMUNITY)

## 2023-07-14 ENCOUNTER — Telehealth (HOSPITAL_COMMUNITY): Payer: Self-pay | Admitting: Pharmacy Technician

## 2023-07-14 DIAGNOSIS — I5031 Acute diastolic (congestive) heart failure: Secondary | ICD-10-CM | POA: Diagnosis not present

## 2023-07-14 DIAGNOSIS — I509 Heart failure, unspecified: Secondary | ICD-10-CM | POA: Diagnosis not present

## 2023-07-14 DIAGNOSIS — I5023 Acute on chronic systolic (congestive) heart failure: Secondary | ICD-10-CM

## 2023-07-14 LAB — BASIC METABOLIC PANEL WITH GFR
Anion gap: 9 (ref 5–15)
BUN: 18 mg/dL (ref 8–23)
CO2: 24 mmol/L (ref 22–32)
Calcium: 8.7 mg/dL — ABNORMAL LOW (ref 8.9–10.3)
Chloride: 106 mmol/L (ref 98–111)
Creatinine, Ser: 1.74 mg/dL — ABNORMAL HIGH (ref 0.61–1.24)
GFR, Estimated: 40 mL/min — ABNORMAL LOW (ref >60)
Glucose, Bld: 96 mg/dL (ref 70–99)
Potassium: 3.8 mmol/L (ref 3.5–5.1)
Sodium: 139 mmol/L (ref 135–145)

## 2023-07-14 LAB — GLUCOSE, CAPILLARY
Glucose-Capillary: 99 mg/dL (ref 70–99)
Glucose-Capillary: 90 mg/dL (ref 70–99)
Glucose-Capillary: 111 mg/dL — ABNORMAL HIGH (ref 70–99)
Glucose-Capillary: 113 mg/dL — ABNORMAL HIGH (ref 70–99)

## 2023-07-14 LAB — ECHOCARDIOGRAM COMPLETE
Area-P 1/2: 7.44 cm2
Calc EF: 40.6 %
Height: 71.5 in
S' Lateral: 5.1 cm
Single Plane A2C EF: 50.5 %
Single Plane A4C EF: 29 %
Weight: 3163.2 [oz_av]

## 2023-07-14 LAB — CBC
HCT: 31.7 % — ABNORMAL LOW (ref 39.0–52.0)
Hemoglobin: 10.2 g/dL — ABNORMAL LOW (ref 13.0–17.0)
MCH: 26.6 pg (ref 26.0–34.0)
MCHC: 32.2 g/dL (ref 30.0–36.0)
MCV: 82.8 fL (ref 80.0–100.0)
Platelets: 251 10*3/uL (ref 150–400)
RBC: 3.83 MIL/uL — ABNORMAL LOW (ref 4.22–5.81)
RDW: 16.2 % — ABNORMAL HIGH (ref 11.5–15.5)
WBC: 6 10*3/uL (ref 4.0–10.5)
nRBC: 0 % (ref 0.0–0.2)

## 2023-07-14 LAB — MAGNESIUM: Magnesium: 1.9 mg/dL (ref 1.7–2.4)

## 2023-07-14 MED ORDER — FUROSEMIDE 10 MG/ML IJ SOLN
40.0000 mg | Freq: Once | INTRAMUSCULAR | Status: AC
  Administered 2023-07-14: 40 mg via INTRAVENOUS
  Filled 2023-07-14: qty 4

## 2023-07-14 MED ORDER — PERFLUTREN LIPID MICROSPHERE
1.0000 mL | INTRAVENOUS | Status: AC | PRN
  Administered 2023-07-14: 1 mL via INTRAVENOUS

## 2023-07-14 MED ORDER — POTASSIUM CHLORIDE CRYS ER 20 MEQ PO TBCR
40.0000 meq | EXTENDED_RELEASE_TABLET | Freq: Once | ORAL | Status: AC
  Administered 2023-07-14: 40 meq via ORAL
  Filled 2023-07-14: qty 2

## 2023-07-14 MED ORDER — EMPAGLIFLOZIN 10 MG PO TABS
10.0000 mg | ORAL_TABLET | Freq: Every day | ORAL | Status: DC
  Administered 2023-07-14 – 2023-07-15 (×2): 10 mg via ORAL
  Filled 2023-07-14 (×2): qty 1

## 2023-07-14 MED ORDER — SPIRONOLACTONE 12.5 MG HALF TABLET
12.5000 mg | ORAL_TABLET | Freq: Every day | ORAL | Status: DC
  Administered 2023-07-14 – 2023-07-15 (×2): 12.5 mg via ORAL
  Filled 2023-07-14 (×2): qty 1

## 2023-07-14 NOTE — TOC CM/SW Note (Signed)
 Transition of Care [facility]) - Inpatient Brief Assessment   Patient Details  Name: Jacob Rios MRN: [id number] Date of Birth: 10-Jul-1945  [facility] CM/SW Contact:    Waddell Barnie Rama, RN Phone Number: 07/14/2023, 1:38 PM   Clinical Narrative: From home with spouse, has PCP and insurance on file, states has no HH services in place at this time or DME at home.  States family member will transport them home at [employer] and family is support system, states gets medications from [facility] in [place].  Pta self ambulatory.    Transition of Care Asessment: Insurance and Status: Insurance coverage has been reviewed Patient has primary care physician: Yes Home environment has been reviewed: home with wife Prior level of function:: indep Prior/Current Home Services: No current home services Social Drivers of Health Review: SDOH reviewed no interventions necessary Readmission risk has been reviewed: Yes Transition of care needs: no transition of care needs at this time

## 2023-07-14 NOTE — Telephone Encounter (Signed)
 Patient Product/process development scientist completed.    The patient is insured through [place]. Patient has Medicare and is not eligible for a copay card, but may be able to apply for patient assistance or Medicare RX Payment Plan (Patient Must reach out to their plan, if eligible for payment plan), if available.    Ran test claim for Entresto 24-26 mg and the current 30 day co-pay is $40.00.  Ran test claim for Farxiga 10 mg and the current 30 day co-pay is $64.00.  Ran test claim for Jardiance 10 mg and the current 30 day co-pay is $40.00.  This test claim was processed through [facility]- copay amounts may vary at other pharmacies due to pharmacy/plan contracts, or as the patient moves through the different stages of their insurance plan.     Reyes Sharps, CPHT Pharmacy Technician III Certified Patient Advocate [facility] Patient Advocate Team Direct Number: [phone]  Fax: [phone]

## 2023-07-14 NOTE — Progress Notes (Signed)
  Progress Note   Patient: Jacob Rios FMW:[id number] DOB: October 13, 1945 DOA: 07/13/2023     1 DOS: the patient was seen and examined on 07/14/2023   Assessment and Plan: New onset CHF exacerbation  - s/p IV lasix 40 mg x 1 on 07/14/2023  - Aldactone 12.5 mg PO daily  - Appreciate cardiology following  - ECHO pending  - Jardiance 10 mg PO daily   Normocytic anemia  - Monitor   HTN  - Toprol  XL 50 mg PO daily   DM2 - Novolog  SS tid  CAD/PAD - Lipitor 40 mg PO at bedtime  - Plavix  75 mg PO daily   Dyslipidemia  - Lipitor 40 mg PO at bedtime   Hx of CVA w/ residual deficit  - Statin as above   CKD3b - Monitor   Hx of skin CA  - F/u with dermatology as an outpt   GERD  - Protonix  40 mg PO daily   Subjective: Pt seen and examined at the bedside. Appreciate cardiology following along. ECHO report pending. Further recommendations once ECHO is reported.  Still requiring [place] @ 1L.  Physical Exam: Vitals:   07/14/23 0731 07/14/23 1023 07/14/23 1030 07/14/23 1104  BP: (!) 141/76 (!) 145/77  (!) 148/70  Pulse: 68 65  63  Resp: 16   19  Temp: 98.2 F (36.8 C)   (!) 97.3 F (36.3 C)  TempSrc: Oral   Oral  SpO2: 99%  99% 98%  Weight:      Height:       Physical Exam HENT:     Head: Normocephalic.   Cardiovascular:     Rate and Rhythm: Normal rate.  Pulmonary:     Effort: Pulmonary effort is normal.  Abdominal:     Palpations: Abdomen is soft.   Musculoskeletal:        General: Normal range of motion.   Skin:    General: Skin is warm.   Neurological:     Mental Status: He is alert and oriented to person, place, and time.   Psychiatric:        Mood and Affect: Mood normal.      Disposition: Status is: Inpatient Remains inpatient appropriate because: Oxygen weaning, ECHO and cardiology follow up   Planned Discharge Destination: Home    Time spent: 35 minutes  Author: Isobella Ascher , MD 07/14/2023 12:22 PM  For on call review [email or web address].

## 2023-07-14 NOTE — TOC Transition Note (Addendum)
 Transition of Care [facility]) - Discharge Note   Patient Details  Name: Jacob Rios MRN: [id number] Date of Birth: 19-Sep-1945  [facility] CM/SW Contact:  Waddell Barnie Rama, RN Phone Number: 07/14/2023, 1:38 PM   Clinical Narrative:    For possible dc today, has no needs.  Patient states he does not want HHRN .           Patient Goals and CMS Choice            Discharge Placement                       Discharge Plan and Services Additional resources added to the After Visit Summary for                                       Social Drivers of Health (SDOH) Interventions SDOH Screenings   Food Insecurity: No Food Insecurity (07/13/2023)  Housing: Low Risk  (07/13/2023)  Transportation Needs: No Transportation Needs (07/13/2023)  Utilities: Not At Risk (07/13/2023)  Alcohol Screen: Low Risk  (12/24/2022)  Depression (PHQ2-9): Low Risk  (12/24/2022)  Financial Resource Strain: Low Risk  (12/24/2022)  Physical Activity: Sufficiently Active (12/24/2022)  Social Connections: Socially Integrated (07/13/2023)  Stress: No Stress Concern Present (12/24/2022)  Tobacco Use: Medium Risk (07/13/2023)  Health Literacy: Adequate Health Literacy (12/24/2022)     Readmission Risk Interventions    07/14/2023    1:37 PM  Readmission Risk Prevention Plan  Post Dischage Appt Complete  Medication Screening Complete  Transportation Screening Complete

## 2023-07-14 NOTE — Progress Notes (Addendum)
  Progress Note  Patient Name: Jacob Rios Date of Encounter: 07/14/2023 [facility] Cardiologist: Lonni Cash, MD   Interval Summary    Feeling much better this morning, sitting up on the side of the bed. [place] @1L   Vital Signs Vitals:   07/13/23 1928 07/14/23 0027 07/14/23 0427 07/14/23 0731  BP: (!) 127/49 134/65 (!) 157/51 (!) 141/76  Pulse: 60 69 69 68  Resp: 19 17 16 16   Temp: 98 F (36.7 C) 97.8 F (36.6 C) (!) 97.2 F (36.2 C) 98.2 F (36.8 C)  TempSrc: Oral Oral Oral Oral  SpO2: 94% 98% 100% 99%  Weight:   89.7 kg   Height:        Intake/Output Summary (Last 24 hours) at 07/14/2023 0904 Last data filed at 07/14/2023 0800 Gross per 24 hour  Intake 483 ml  Output 4100 ml  Net -3617 ml      07/14/2023    4:27 AM 07/13/2023    3:26 PM 07/13/2023    5:09 AM  Last 3 Weights  Weight (lbs) 197 lb 11.2 oz 202 lb 2.6 oz 205 lb  Weight (kg) 89.676 kg 91.7 kg 92.987 kg      Telemetry/ECG  Sinus Rhythm, PVCs, short episodes of 3-4 beats of NSVT - Personally Reviewed  Physical Exam   GEN: No acute distress.   Neck: No JVD Cardiac: RRR, no murmurs, rubs, or gallops.  Respiratory: Clear to auscultation bilaterally. GI: Soft, nontender, non-distended  MS: No edema  Assessment & Plan   78 y.o. male with a hx of CAD s/p CABG 2000, last cath 2005 showed occluded LIMA to LAD, native LAD open, patent SVG to diagonal and patent RIMA to RCA, normal stress 2014, HTN, HLD, DM and former tobacco abuse, AAA, CVA, loop recorder in place after cryptogenic stroke, PAD,  who was seen 07/13/2023 for the evaluation of acute CHF at the request of Dr. Claudene.   Acute on Chronic HFpEF -- presented with worsening shortness of breath, LE edema and orthopnea  -- BNP 1107, CXR with cardiac enlargement, pulmonary edema and small pleural effusions  -- echo pending -- s/p IV lasix 40mg  x2, net - 3.6L. Weight 205>>197lbs. Feels much better today, volume improved.  -- continue  Toprol  XL 50mg  daily, will add jardiance, spiro 12.5mg  daily  CAD s/p prior CABG (LIMA-LAD, SVG-Diag, RIMA-RCA) and PCI -- hsTn negative x2, no anginal symptoms -- continue plavix , lipitor 40mg  daily   CKD lllb -- baseline around 1.5, up to 1.7 today -- follow up BMET in am  NSVT -- continue Toprol  XL 50mg  daily -- supp K, check mag  HLD -- HDL 31, LDL 65 (01/2023) -- continue statin    For questions or updates, please contact [facility] Please consult [email or web address] for contact info under       Signed, Manuelita Rummer, NP   I have personally seen and examined this patient. I agree with the assessment and plan as outlined above.  Pt feeling much better today. Dyspnea improved after IV lasix. Weight down by at least 5 lbs with 3600 net negative.  Still requiring 1L nasal cannula.  Labs reviewed. Renal function stable.  Would give one more dose of IV Lasix today.  Echo pending.  If patient stable later today and no concerning findings on echo, could d/c home today. At discharge would have him take Lasix 20 mg po daily.   Lonni Cash, MD, [facility] 07/14/2023 9:39 AM

## 2023-07-15 DIAGNOSIS — I2510 Atherosclerotic heart disease of native coronary artery without angina pectoris: Secondary | ICD-10-CM

## 2023-07-15 DIAGNOSIS — I340 Nonrheumatic mitral (valve) insufficiency: Secondary | ICD-10-CM | POA: Diagnosis not present

## 2023-07-15 DIAGNOSIS — I5023 Acute on chronic systolic (congestive) heart failure: Secondary | ICD-10-CM

## 2023-07-15 DIAGNOSIS — I255 Ischemic cardiomyopathy: Secondary | ICD-10-CM | POA: Diagnosis not present

## 2023-07-15 DIAGNOSIS — I509 Heart failure, unspecified: Secondary | ICD-10-CM | POA: Diagnosis not present

## 2023-07-15 LAB — BASIC METABOLIC PANEL WITH GFR
Anion gap: 9 (ref 5–15)
BUN: 17 mg/dL (ref 8–23)
CO2: 25 mmol/L (ref 22–32)
Calcium: 8.7 mg/dL — ABNORMAL LOW (ref 8.9–10.3)
Chloride: 104 mmol/L (ref 98–111)
Creatinine, Ser: 1.85 mg/dL — ABNORMAL HIGH (ref 0.61–1.24)
GFR, Estimated: 37 mL/min — ABNORMAL LOW (ref >60)
Glucose, Bld: 96 mg/dL (ref 70–99)
Potassium: 4.1 mmol/L (ref 3.5–5.1)
Sodium: 138 mmol/L (ref 135–145)

## 2023-07-15 LAB — HEMOGLOBIN A1C
Hgb A1c MFr Bld: 5.1 % (ref 4.8–5.6)
Mean Plasma Glucose: 99.67 mg/dL

## 2023-07-15 LAB — CBC
HCT: 32 % — ABNORMAL LOW (ref 39.0–52.0)
Hemoglobin: 10.4 g/dL — ABNORMAL LOW (ref 13.0–17.0)
MCH: 26.5 pg (ref 26.0–34.0)
MCHC: 32.5 g/dL (ref 30.0–36.0)
MCV: 81.6 fL (ref 80.0–100.0)
Platelets: 213 10*3/uL (ref 150–400)
RBC: 3.92 MIL/uL — ABNORMAL LOW (ref 4.22–5.81)
RDW: 16.1 % — ABNORMAL HIGH (ref 11.5–15.5)
WBC: 4.2 10*3/uL (ref 4.0–10.5)
nRBC: 0 % (ref 0.0–0.2)

## 2023-07-15 LAB — GLUCOSE, CAPILLARY: Glucose-Capillary: 108 mg/dL — ABNORMAL HIGH (ref 70–99)

## 2023-07-15 LAB — PHOSPHORUS: Phosphorus: 3.8 mg/dL (ref 2.5–4.6)

## 2023-07-15 LAB — MAGNESIUM: Magnesium: 1.9 mg/dL (ref 1.7–2.4)

## 2023-07-15 MED ORDER — MAGNESIUM OXIDE -MG SUPPLEMENT 400 (240 MG) MG PO TABS
800.0000 mg | ORAL_TABLET | Freq: Once | ORAL | Status: AC
  Administered 2023-07-15: 800 mg via ORAL
  Filled 2023-07-15: qty 2

## 2023-07-15 MED ORDER — FUROSEMIDE 40 MG PO TABS: 40.0000 mg | ORAL_TABLET | Freq: Every day | ORAL | 0 refills | Status: DC

## 2023-07-15 MED ORDER — SPIRONOLACTONE 25 MG PO TABS: 12.5000 mg | ORAL_TABLET | Freq: Every day | ORAL | 0 refills | Status: DC

## 2023-07-15 MED ORDER — MAGNESIUM SULFATE 2 GM/50ML IV SOLN
2.0000 g | Freq: Once | INTRAVENOUS | Status: DC
  Administered 2023-07-15: 2 g via INTRAVENOUS
  Filled 2023-07-15: qty 50

## 2023-07-15 MED ORDER — EMPAGLIFLOZIN 10 MG PO TABS: 10.0000 mg | ORAL_TABLET | Freq: Every day | ORAL | 0 refills | Status: DC

## 2023-07-15 NOTE — Discharge Summary (Signed)
 Physician Discharge Summary  Jacob Rios FMW:[id number] DOB: Mar 09, 1945 DOA: 07/13/2023  PCP: Gladis Mustard, FNP  Admit date: 07/13/2023 Discharge date: 07/15/2023  Admitted From: Home Disposition: Home  Recommendations for Outpatient Follow-up:  Follow up with PCP in 1-2 weeks Follow-up with cardiology, Dr. Verlin 2 weeks Started on furosemide 40 mg p.o. daily, spironolactone 12.5 mg p.o. daily, Jardiance. Instructed to maintain daily weight log, bring to next PCP/specialist visit Please obtain BMP/magnesium level in one week to recheck electrolytes, renal function  Home Health: No Equipment/Devices: None  Discharge Condition: Stable CODE STATUS: Full code Diet recommendation: Heart healthy/consistent carb regular diet  History of present illness:  Jacob Rios  is a 78 y.o. male with medical history significant of hypertension, hyperlipidemia, CAD s/p PCI and CABG,CVA 2022 resulting in approximately 40% vision loss in the left eye., s/p loop recorder, DM type II, PAD, CKD stage III, history of lung cancer, prostate cancer, and history of skin cancerpresents with shortness of breath. He has been experiencing shortness of breath for the past five days. Initially, the symptoms were intermittent and he felt like they improved with activity, such as being outdoors or engaging in tasks like riding a lawnmower. Over the last three days, the shortness of breath has worsened, particularly when lying down, causing a sensation of suffocation and inability to breathe comfortably. He reports expectorating green-yellow sputum and congestion. No cough, leg swelling, or calf pain. He has not experienced fevers, chills, or palpitations.   In the ED patient was noted to be afebrile with mild tachypnea, blood pressures elevated up to 151/86, and O2 saturations maintained currently on 2 L nasal cannula oxygen.  Labs significant for hemoglobin 9.7, BUN 18, creatinine 1.52, calcium  8.4, and  high-sensitivity troponin 10.  Chest x-ray showed pulmonary edema with bilateral trace pleural effusions..  CTA of the chest noted no pulmonary embolism and acute pulmonary edema with small pleural effusions and increased cardiac size since 2021.  Patient had been given Lasix 40 mg IV.  Hospital course:  Acute hypoxic respite failure: Resolved Acute on chronic combined systolic/diastolic congestive heart failure Moderate/severe mitral regurgitation Patient presents with complaints of shortness of breath.  O2 saturations noted to be in the low 90s on room air which patient was placed on nasal cannula oxygen.  Last echocardiogram noted EF to be 50 to 55% with normal diastolic parameters back in 2022; repeat TTE this admission with LVEF 30-35%, LV with moderately decreased function and global hypokinesis, grade 2 diastolic dysfunction, RV systolic function moderately reduced, moderate/severe MR.. Patient loop recorder was recently interrogated on 6/26 and showed no heart arrhythmias.  Cardiology was consulted and followed during the hospital course.  Patient was diuresed with IV Lasix; in which now has been transitioned to oral.  Started on Jardiance, spironolactone 12.5 mg p.o. daily and furosemide 40 m p.o. daily.  Continue metoprolol .  Did not start ARB or ACE inhibitor given renal insufficiency.  Outpatient follow-up with cardiology. - Admit to a cardiac telemetry bed - Heart failure order set utilized - Strict I&O's and daily weights - Check BNP (1107) and  TSH - Check echocardiogram - Lasix 40 mg IV twice daily.  Reassess daily and adjust diuresis as deemed medically appropriate - Continue beta-blocker - Cardiology consulted,  will follow-up for any further recommendations   Normocytic anemia Hemoglobin baseline 11-13; stable 10.4 at time of discharge.    Essential hypertension Continue metoprolol  succinate 50 g p.o. daily.  Started on spironolactone 12.5 mg p.o.  daily and Lasix 40 mg p.o.  daily as above.   Controlled diabetes mellitus type 2, without long-term use of insulin  Hemoglobin A1c 5.1%, well-controlled.  Continue glipizide , metformin .  Started on Jardiance.   CAD PAD Patient with prior history of PCI and CABG2022. Continue Plavix  and atorvastatin    Dyslipidemia Last lipid panel from 01/20/2023 noted total cholesterol 115, HDL 31, triglycerides 103, and LDL 65. Continue atorvastatin    History of CVA with residual deficit Patient with prior history of CVA back in 2022 with reported 40% vision loss out of his left eye. Continue Plavix  and statin   CKD stage IIIb Creatinine 1.85 at time of discharge.  Started on spironolactone and furosemide as above.  Recommend repeat BMP 1 week.   History of skin cancer Patient made noted that he just recently had lesion removed off his right forearm. Continue patient follow-up with dermatology    GERD Continue Prilosec  Discharge Diagnoses:  Principal Problem:   New onset of congestive heart failure (HCC) Active Problems:   Normocytic anemia   Essential hypertension   Diabetes mellitus treated with oral medication (HCC)   CAD, AUTOLOGOUS BYPASS GRAFT   PAD (peripheral artery disease) (HCC)   Dyslipidemia   History of CVA with residual deficit   CKD (chronic kidney disease), stage III (HCC)   History of prostate cancer   History of skin cancer   Gastroesophageal reflux disease without esophagitis    Discharge Instructions  Discharge Instructions     Call MD for:  difficulty breathing, headache or visual disturbances   Complete by: As directed    Call MD for:  extreme fatigue   Complete by: As directed    Call MD for:  persistant dizziness or light-headedness   Complete by: As directed    Call MD for:  persistant nausea and vomiting   Complete by: As directed    Call MD for:  severe uncontrolled pain   Complete by: As directed    Call MD for:  temperature >100.4   Complete by: As directed    Diet - low  sodium heart healthy   Complete by: As directed    Increase activity slowly   Complete by: As directed       Allergies as of 07/15/2023       Reactions   Ms Contin [morphine] Shortness Of Breath, Swelling   Zetia  [ezetimibe ] Other (See Comments)   Dizziness        Medication List     TAKE these medications    acetaminophen  500 MG tablet Commonly known as: TYLENOL  Take 1,000 mg by mouth every 4 (four) hours as needed for moderate pain (pain score 4-6) or headache.   atorvastatin  40 MG tablet Commonly known as: LIPITOR Take 1 tablet (40 mg total) by mouth daily. What changed: when to take this   clopidogrel  75 MG tablet Commonly known as: PLAVIX  Take 1 tablet (75 mg total) by mouth daily. What changed: when to take this   empagliflozin 10 MG Tabs tablet Commonly known as: JARDIANCE Take 1 tablet (10 mg total) by mouth daily. Start taking on: July 16, 2023   furosemide 40 MG tablet Commonly known as: Lasix Take 1 tablet (40 mg total) by mouth daily.   glipiZIDE  10 MG 24 hr tablet Commonly known as: GLUCOTROL  XL Take 1 tablet (10 mg total) by mouth daily. What changed: when to take this   glucose blood test strip Commonly known as: OneTouch Verio Test 1X per day  and as needed  Dx 250.02   ibuprofen 200 MG tablet Commonly known as: ADVIL Take 400 mg by mouth 2 (two) times daily as needed for headache or moderate pain (pain score 4-6).   metFORMIN  1000 MG tablet Commonly known as: GLUCOPHAGE  Take 1 tablet (1,000 mg total) by mouth 2 (two) times daily with a meal. What changed: when to take this   metoprolol  succinate 50 MG 24 hr tablet Commonly known as: TOPROL -XL TAKE 1 TABLET BY MOUTH ONCE DAILY WITH OR IMMEDIATELY FOLLOWING A MEAL   omeprazole  40 MG capsule Commonly known as: PRILOSEC Take 1 capsule (40 mg total) by mouth daily. What changed: when to take this   onetouch ultrasoft lancets Patient test 1X per day and prn  Dx 250.02   spironolactone  25 MG tablet Commonly known as: ALDACTONE Take 0.5 tablets (12.5 mg total) by mouth daily. Start taking on: July 16, 2023   VITAMIN C PO Take 1 tablet by mouth daily.   VITAMIN D -3 PO Take 1 tablet by mouth daily.        Follow-up Information     Gladis Mustard, FNP. Schedule an appointment as soon as possible for a visit in 1 week(s).   Specialty: Family Medicine Contact information: [address] [place] [address] [phone]         Verlin Lonni BIRCH, MD. Schedule an appointment as soon as possible for a visit in 2 week(s).   Specialty: Cardiology Contact information: [address] [place] [address] [phone]                Allergies  Allergen Reactions   Ms Contin [Morphine] Shortness Of Breath and Swelling   Zetia  [Ezetimibe ] Other (See Comments)    Dizziness    Consultations: Cardiology   Procedures/Studies: ECHOCARDIOGRAM COMPLETE Result Date: 07/14/2023    ECHOCARDIOGRAM REPORT   Patient Name:   MALIN CERVINI Date of Exam: 07/14/2023 Medical Rec #:  [id number]      Height:       71.5 in Accession #:    [id number]     Weight:       197.7 lb Date of Birth:  July 03, 1945      BSA:          2.109 m Patient Age:    77 years       BP:           148/70 mmHg Patient Gender: M              HR:           71 bpm. Exam Location:  Inpatient Procedure: 2D Echo, Cardiac Doppler, Color Doppler and Intracardiac            Opacification Agent (Both Spectral and Color Flow Doppler were            utilized during procedure). Indications:    CHF  History:        Patient has prior history of Echocardiogram examinations. CHF.  Sonographer:    Vella Key Referring Phys: MAXIMINO DELENA SHARPS IMPRESSIONS  1. No left ventricular thrombus is seen (Definity contrast was used). Left ventricular ejection fraction, by estimation, is 30 to 35%. The left ventricle has moderately decreased function. The left ventricle demonstrates global hypokinesis. The left  ventricular internal cavity size was moderately dilated. Left ventricular diastolic parameters are consistent with Grade II diastolic dysfunction (pseudonormalization). Elevated left atrial pressure.  2. Right ventricular systolic function is moderately reduced.  The right ventricular size is normal. Tricuspid regurgitation signal is inadequate for assessing PA pressure.  3. Left atrial size was severely dilated.  4. The mitral valve is normal in structure. Moderate to severe mitral valve regurgitation.  5. The aortic valve is tricuspid. Aortic valve regurgitation is trivial. No aortic stenosis is present. Comparison(s): Prior images reviewed side by side. Changes from prior study are noted. The left ventricular function is significantly worse. The left ventricular chamber has dilated. There is new moderate to severe mitral insufficiency, likely functional. FINDINGS  Left Ventricle: No left ventricular thrombus is seen (Definity contrast was used). Left ventricular ejection fraction, by estimation, is 30 to 35%. The left ventricle has moderately decreased function. The left ventricle demonstrates global hypokinesis.  The left ventricular internal cavity size was moderately dilated. There is no left ventricular hypertrophy. Left ventricular diastolic parameters are consistent with Grade II diastolic dysfunction (pseudonormalization). Elevated left atrial pressure. Right Ventricle: The right ventricular size is normal. No increase in right ventricular wall thickness. Right ventricular systolic function is moderately reduced. Tricuspid regurgitation signal is inadequate for assessing PA pressure. Left Atrium: Left atrial size was severely dilated. Right Atrium: Right atrial size was normal in size. Pericardium: There is no evidence of pericardial effusion. Mitral Valve: The mitral valve is normal in structure. Moderate to severe mitral valve regurgitation, with centrally-directed jet. Tricuspid Valve: The tricuspid  valve is normal in structure. Tricuspid valve regurgitation is not demonstrated. Aortic Valve: The aortic valve is tricuspid. Aortic valve regurgitation is trivial. No aortic stenosis is present. Pulmonic Valve: The pulmonic valve was normal in structure. Pulmonic valve regurgitation is mild. No evidence of pulmonic stenosis. Aorta: The aortic root and ascending aorta are structurally normal, with no evidence of dilitation. IAS/Shunts: No atrial level shunt detected by color flow Doppler.  LEFT VENTRICLE PLAX 2D LVIDd:         6.10 cm LVIDs:         5.10 cm LV PW:         1.10 cm LV IVS:        1.10 cm LVOT diam:     2.05 cm LV SV:         63 LV SV Index:   30 LVOT Area:     3.30 cm  LV Volumes (MOD) LV vol d, MOD A2C: 210.0 ml LV vol d, MOD A4C: 238.0 ml LV vol s, MOD A2C: 104.0 ml LV vol s, MOD A4C: 169.0 ml LV SV MOD A2C:     106.0 ml LV SV MOD A4C:     238.0 ml LV SV MOD BP:      91.4 ml RIGHT VENTRICLE RV Basal diam:  3.80 cm RV S prime:     7.71 cm/s TAPSE (M-mode): 1.3 cm LEFT ATRIUM              Index        RIGHT ATRIUM           Index LA diam:        5.20 cm  2.47 cm/m   RA Area:     15.20 cm LA Vol (A2C):   103.0 ml 48.83 ml/m  RA Volume:   36.50 ml  17.31 ml/m LA Vol (A4C):   97.7 ml  46.32 ml/m LA Biplane Vol: 104.0 ml 49.31 ml/m  AORTIC VALVE LVOT Vmax:   88.90 cm/s LVOT Vmean:  58.467 cm/s LVOT VTI:    0.190 m  AORTA Ao Root diam:  4.00 cm Ao Asc diam:  3.50 cm MITRAL VALVE MV Area (PHT): 7.44 cm     SHUNTS MV Decel Time: 102 msec     Systemic VTI:  0.19 m MV E velocity: 107.00 cm/s  Systemic Diam: 2.05 cm MV A velocity: 91.70 cm/s MV E/A ratio:  1.17 Mihai Croitoru MD Electronically signed by Jerel Balding MD Signature Date/Time: 07/14/2023/3:20:37 PM    Final    CT Angio Chest PE W and/or Wo Contrast Result Date: 07/13/2023 CLINICAL DATA:  78 year old male with shortness of breath. EXAM: CT ANGIOGRAPHY CHEST WITH CONTRAST TECHNIQUE: Multidetector CT imaging of the chest was performed using  the standard protocol during bolus administration of intravenous contrast. Multiplanar CT image reconstructions and MIPs were obtained to evaluate the vascular anatomy. RADIATION DOSE REDUCTION: This exam was performed according to the departmental dose-optimization program which includes automated exposure control, adjustment of the mA and/or kV according to patient size and/or use of iterative reconstruction technique. CONTRAST:  75mL OMNIPAQUE  IOHEXOL  350 MG/ML SOLN COMPARISON:  Portable chest 0529 hours today. CT Abdomen and Pelvis 12/19/2019. FINDINGS: Cardiovascular: Good contrast bolus timing in the pulmonary arterial tree. Mild upper and mild to moderate lower chest respiratory motion. No pulmonary artery filling defect is identified. Extensive Calcified aortic atherosclerosis. Prior CABG. Mild cardiomegaly, new since 2021. No pericardial effusion. No contrast in the aorta. Calcified coronary artery atherosclerosis *CRASH* that Mediastinum/Nodes: Postoperative changes. Reactive appearing mediastinal lymph nodes up to 11 mm short axis. Smaller hilar lymph nodes. No discrete mediastinal mass. Lungs/Pleura: Small bilateral pleural effusions are layering and tracking into the major fissures. Fairly simple fluid density. Respiratory motion, major airways remain patent. Generalized pulmonary septal thickening. Additional bilateral pulmonary ground-glass and streaky peribronchial opacity. No air bronchograms. Upper Abdomen: Negative visible noncontrast liver, gallbladder, spleen, pancreas, adrenal glands and bowel. Musculoskeletal: Previous sternotomy. Chronic severe T7-T8 disc and endplate degeneration with evidence of developing interbody ankylosis at that level. Chronic L1 compression fracture is stable since 2021. No acute or suspicious osseous lesion identified. Review of the MIP images confirms the above findings. IMPRESSION: 1. Good contrast timing with respiratory motion artifact. No pulmonary embolus  identified. 2. Constellation of Acute Pulmonary Edema, small pleural effusions. 3. Increased cardiac size since 2021, prior CABG, advanced Aortic Atherosclerosis (ICD10-I70.0). Electronically Signed   By: VEAR Hurst M.D.   On: 07/13/2023 06:35   DG Chest Portable 1 View Result Date: 07/13/2023 CLINICAL DATA:  SOB EXAM: PORTABLE CHEST 1 VIEW COMPARISON:  Chest x-ray 02/02/2020 FINDINGS: The heart and mediastinal contours are unchanged. Atherosclerotic plaque. No focal consolidation. Chronic coarsened interstitial markings with superimposed pulmonary edema. Bilateral trace pleural effusion. No pneumothorax. No acute osseous abnormality.  Intact sternotomy wires. IMPRESSION: Pulmonary edema with bilateral trace pleural effusions. Electronically Signed   By: Morgane  Naveau M.D.   On: 07/13/2023 05:40   CUP PACEART REMOTE DEVICE CHECK Result Date: 07/09/2023 ILR summary report received. Battery status OK. Normal device function. No new symptom, tachy, brady, or pause episodes. No new AF episodes. Monthly summary reports and ROV/PRN ML, CVRS    Subjective: Patient seen examined bedside, lying in bed.  Spouse present.  No complaints this morning.  Dyspnea much improved.  Remains off of oxygen.  Seen by cardiology this morning and signed off with recommendation of outpatient follow-up.  Patient with no specific questions, concerns or complaints at this time.  Denies headache, no dizziness, no chest pain, no palpitations, no current shortness of breath, no abdominal pain, no fever/chills/night sweats,  no nausea/vomiting/diarrhea, no focal weakness, no fatigue, no paresthesias.  No acute events overnight per nurse.  Discharge Exam: Vitals:   07/15/23 0351 07/15/23 0716  BP: (!) 143/65 (!) 159/74  Pulse: 60 62  Resp: 17 13  Temp: 98 F (36.7 C) (!) 97.4 F (36.3 C)  SpO2: 97% 99%   Vitals:   07/14/23 1522 07/14/23 2034 07/15/23 0351 07/15/23 0716  BP: 128/62 (!) 120/55 (!) 143/65 (!) 159/74  Pulse: (!)  40 69 60 62  Resp: 18 19 17 13   Temp: (!) 97.4 F (36.3 C) 97.9 F (36.6 C) 98 F (36.7 C) (!) 97.4 F (36.3 C)  TempSrc: Oral Oral Oral Oral  SpO2: 100% 94% 97% 99%  Weight:   87.9 kg   Height:        Physical Exam: GEN: NAD, alert and oriented x 3, elderly in appearance HEENT: NCAT, PERRL, EOMI, sclera clear, MMM PULM: CTAB w/o wheezes/crackles, normal respiratory effort, on room air CV: RRR w/o M/G/R GI: abd soft, NTND, NABS, no R/G/M MSK: no peripheral edema, moves all extremities independently NEURO: CN II-XII intact, no focal deficits, sensation to light touch intact PSYCH: normal mood/affect Integumentary: dry/intact, no rashes or wounds    The results of significant diagnostics from this hospitalization (including imaging, microbiology, ancillary and laboratory) are listed below for reference.     Microbiology: Recent Results (from the past 240 hours)  Resp panel by RT-PCR (RSV, Flu A&B, Covid) Anterior Nasal Swab     Status: None   Collection Time: 07/13/23  5:21 AM   Specimen: Anterior Nasal Swab  Result Value Ref Range Status   SARS Coronavirus 2 by RT PCR NEGATIVE NEGATIVE Final   Influenza A by PCR NEGATIVE NEGATIVE Final   Influenza B by PCR NEGATIVE NEGATIVE Final    Comment: (NOTE) The Xpert Xpress SARS-CoV-2/FLU/RSV plus assay is intended as an aid in the diagnosis of influenza from Nasopharyngeal swab specimens and should not be used as a sole basis for treatment. Nasal washings and aspirates are unacceptable for Xpert Xpress SARS-CoV-2/FLU/RSV testing.  Fact Sheet for Patients: BloggerCourse.com  Fact Sheet for Healthcare Providers: SeriousBroker.it  This test is not yet approved or cleared by the [place]  FDA and has been authorized for detection and/or diagnosis of SARS-CoV-2 by FDA under an Emergency Use Authorization (EUA). This EUA will remain in effect (meaning this test can be used)  for the duration of the COVID-19 declaration under Section 564(b)(1) of the Act, 21 U.S.C. section 360bbb-3(b)(1), unless the authorization is terminated or revoked.     Resp Syncytial Virus by PCR NEGATIVE NEGATIVE Final    Comment: (NOTE) Fact Sheet for Patients: BloggerCourse.com  Fact Sheet for Healthcare Providers: SeriousBroker.it  This test is not yet approved or cleared by the [place]  FDA and has been authorized for detection and/or diagnosis of SARS-CoV-2 by FDA under an Emergency Use Authorization (EUA). This EUA will remain in effect (meaning this test can be used) for the duration of the COVID-19 declaration under Section 564(b)(1) of the Act, 21 U.S.C. section 360bbb-3(b)(1), unless the authorization is terminated or revoked.  Performed at [facility], [address] [place], [place] [address]      Labs: BNP (last 3 results) Recent Labs    07/13/23 0805  BNP 1,107.6*   Basic Metabolic Panel: Recent Labs  Lab 07/13/23 0513 07/13/23 0525 07/14/23 0108 07/15/23 0243  NA 139 141 139 138  K 4.3 4.4 3.8 4.1  CL  109 110 106 104  CO2 20*  --  24 25  GLUCOSE 107* 103* 96 96  BUN 18 18 18 17   CREATININE 1.52* 1.50* 1.74* 1.85*  CALCIUM  8.4*  --  8.7* 8.7*  MG  --   --  1.9 1.9  PHOS  --   --   --  3.8   Liver Function Tests: No results for input(s): AST, ALT, ALKPHOS, BILITOT, PROT, ALBUMIN in the last 168 hours. No results for input(s): LIPASE, AMYLASE in the last 168 hours. No results for input(s): AMMONIA in the last 168 hours. CBC: Recent Labs  Lab 07/13/23 0513 07/13/23 0525 07/14/23 0108 07/15/23 0243  WBC 5.2  --  6.0 4.2  HGB 9.7* 10.2* 10.2* 10.4*  HCT 31.5* 30.0* 31.7* 32.0*  MCV 85.1  --  82.8 81.6  PLT 218  --  251 213   Cardiac Enzymes: No results for input(s): CKTOTAL, CKMB, CKMBINDEX, TROPONINI in the last 168 hours. BNP: Invalid input(s):  POCBNP CBG: Recent Labs  Lab 07/14/23 0557 07/14/23 1102 07/14/23 1600 07/14/23 2103 07/15/23 0604  GLUCAP 99 90 111* 113* 108*   D-Dimer No results for input(s): DDIMER in the last 72 hours. Hgb A1c Recent Labs    07/13/23 0731 07/15/23 0243  HGBA1C 5.1 5.1   Lipid Profile No results for input(s): CHOL, HDL, LDLCALC, TRIG, CHOLHDL, LDLDIRECT in the last 72 hours. Thyroid  function studies Recent Labs    07/13/23 0731  TSH 1.354   Anemia work up No results for input(s): VITAMINB12, FOLATE, FERRITIN, TIBC, IRON, RETICCTPCT in the last 72 hours. Urinalysis    Component Value Date/Time   COLORURINE AMBER (A) 07/15/2018 1202   APPEARANCEUR Clear 02/12/2023 1016   LABSPEC 1.026 07/15/2018 1202   PHURINE 5.0 07/15/2018 1202   GLUCOSEU Negative 02/12/2023 1016   HGBUR NEGATIVE 07/15/2018 1202   BILIRUBINUR Negative 02/12/2023 1016   KETONESUR NEGATIVE 07/15/2018 1202   PROTEINUR 1+ (A) 02/12/2023 1016   PROTEINUR 100 (A) 07/15/2018 1202   UROBILINOGEN 0.2 04/08/2019 1118   NITRITE Negative 02/12/2023 1016   NITRITE NEGATIVE 07/15/2018 1202   LEUKOCYTESUR Negative 02/12/2023 1016   LEUKOCYTESUR LARGE (A) 07/15/2018 1202   Sepsis Labs Recent Labs  Lab 07/13/23 0513 07/14/23 0108 07/15/23 0243  WBC 5.2 6.0 4.2   Microbiology Recent Results (from the past 240 hours)  Resp panel by RT-PCR (RSV, Flu A&B, Covid) Anterior Nasal Swab     Status: None   Collection Time: 07/13/23  5:21 AM   Specimen: Anterior Nasal Swab  Result Value Ref Range Status   SARS Coronavirus 2 by RT PCR NEGATIVE NEGATIVE Final   Influenza A by PCR NEGATIVE NEGATIVE Final   Influenza B by PCR NEGATIVE NEGATIVE Final    Comment: (NOTE) The Xpert Xpress SARS-CoV-2/FLU/RSV plus assay is intended as an aid in the diagnosis of influenza from Nasopharyngeal swab specimens and should not be used as a sole basis for treatment. Nasal washings and aspirates are  unacceptable for Xpert Xpress SARS-CoV-2/FLU/RSV testing.  Fact Sheet for Patients: BloggerCourse.com  Fact Sheet for Healthcare Providers: SeriousBroker.it  This test is not yet approved or cleared by the [place]  FDA and has been authorized for detection and/or diagnosis of SARS-CoV-2 by FDA under an Emergency Use Authorization (EUA). This EUA will remain in effect (meaning this test can be used) for the duration of the COVID-19 declaration under Section 564(b)(1) of the Act, 21 U.S.C. section 360bbb-3(b)(1), unless the authorization is terminated or revoked.  Resp Syncytial Virus by PCR NEGATIVE NEGATIVE Final    Comment: (NOTE) Fact Sheet for Patients: BloggerCourse.com  Fact Sheet for Healthcare Providers: SeriousBroker.it  This test is not yet approved or cleared by the [place]  FDA and has been authorized for detection and/or diagnosis of SARS-CoV-2 by FDA under an Emergency Use Authorization (EUA). This EUA will remain in effect (meaning this test can be used) for the duration of the COVID-19 declaration under Section 564(b)(1) of the Act, 21 U.S.C. section 360bbb-3(b)(1), unless the authorization is terminated or revoked.  Performed at [facility], [address] [place], [place] [address]      Time coordinating discharge: Over 30 minutes  SIGNED:   Camellia PARAS [place], DO  [facility] 07/15/2023, 10:09 AM

## 2023-07-15 NOTE — Progress Notes (Addendum)
  Progress Note  Patient Name: Jacob Rios Date of Encounter: 07/15/2023 [facility] Cardiologist: Lonni Cash, MD   Interval Summary    No complaints today. No chest pain. Dyspnea improved.   Vital Signs Vitals:   07/14/23 1522 07/14/23 2034 07/15/23 0351 07/15/23 0716  BP: 128/62 (!) 120/55 (!) 143/65 (!) 159/74  Pulse: (!) 40 69 60 62  Resp: 18 19 17 13   Temp: (!) 97.4 F (36.3 C) 97.9 F (36.6 C) 98 F (36.7 C) (!) 97.4 F (36.3 C)  TempSrc: Oral Oral Oral Oral  SpO2: 100% 94% 97% 99%  Weight:   87.9 kg   Height:        Intake/Output Summary (Last 24 hours) at 07/15/2023 0749 Last data filed at 07/15/2023 0354 Gross per 24 hour  Intake 780 ml  Output 2000 ml  Net -1220 ml      07/15/2023    3:51 AM 07/14/2023    4:27 AM 07/13/2023    3:26 PM  Last 3 Weights  Weight (lbs) 193 lb 12.8 oz 197 lb 11.2 oz 202 lb 2.6 oz  Weight (kg) 87.907 kg 89.676 kg 91.7 kg      Telemetry/ECG  No AM EKG Tele with sinus with PVCs  Physical Exam   General: Well developed, well nourished, NAD  HEENT: OP clear, mucus membranes moist  SKIN: warm, dry. No rashes. Neuro: No focal deficits  Musculoskeletal: Muscle strength 5/5 all ext  Psychiatric: Mood and affect normal  Neck: No JVD.  Lungs:Clear bilaterally, no wheezes, rhonci, crackles Cardiovascular: Regular rate and rhythm. No murmurs, gallops or rubs. Extremities: No lower extremity edema.  Assessment & Plan   78 y.o. male with a hx of CAD s/p CABG 2000, last cath 2005 showed occluded LIMA to LAD, native LAD open, patent SVG to diagonal and patent RIMA to RCA, normal stress 2014, HTN, HLD, DM and former tobacco abuse, AAA, CVA, loop recorder in place after cryptogenic stroke, PAD,  who was seen 07/13/2023 for the evaluation of acute CHF at the request of Dr. Claudene.   Acute on Chronic HFrEF/Ischemic cardiomyopathy: He is much improved following diuresis. Net negative 5 liters since admission. Echo with LVEF  around 35% with global hypokinesis. Last echo in 2022 with LVEF around 50%. Will continue with medical therapy for now. Continue Toprol , Jardiance, spironolactone. Will not add an ARB or Ace-inh given his renal insufficiency.   Mitral regurgitation: Moderate to severe by echo this admission. Will follow.   CAD s/p prior CABG (LIMA-LAD, SVG-Diag, RIMA-RCA) and PCI: Troponin negative. NO chest pain. Continue Plavix  and Lipitor.   CKD lllb: Baseline creatinine around 1.5. Up to 1.8 today. Will stop IV Lasix. He will need to be started on Lasix 40 mg per day starting tomorrow. BMET in one week in our office.   HLD: Continue statin.    OK to discharge home today. Would add Lasix 40 mg po daily starting tomorrow. We will arrange follow up in our office.   For questions or updates, please contact [facility] Please consult [email or web address] for contact info under     Lonni Cash, MD, FACC 07/15/2023 7:49 AM

## 2023-07-15 NOTE — Progress Notes (Addendum)
 Heart Failure Navigator Progress Note  Assessed for Heart & Vascular TOC clinic readiness.  Patient does not meet criteria due to has scheduled appointment with [facility] on 07/21/2023 and will discharge with a [facility] follow up appointment. No HF TOC. .   Navigator will sign off at this time.   Stephane Haddock, BSN, Scientist, clinical (histocompatibility and immunogenetics) Only

## 2023-07-16 ENCOUNTER — Telehealth: Payer: Self-pay | Admitting: *Deleted

## 2023-07-16 NOTE — Transitions of Care (Post Inpatient/ED Visit) (Signed)
   07/16/2023  Name: Jacob Rios MRN: [id number] DOB: 09-02-1945  Today's TOC FU Call Status: Today's TOC FU Call Status:: Unsuccessful Call (1st Attempt) Unsuccessful Call (1st Attempt) Date: 07/16/23  Attempted to reach the patient regarding the most recent Inpatient/ED visit.  Follow Up Plan: Additional outreach attempts will be made to reach the patient to complete the Transitions of Care (Post Inpatient/ED visit) call.   Mliss Creed [facility], BSN RN Care Manager/ Transition of Care [place]/ [facility] [phone]

## 2023-07-21 ENCOUNTER — Ambulatory Visit: Payer: Self-pay | Admitting: Nurse Practitioner

## 2023-07-21 ENCOUNTER — Ambulatory Visit: Payer: Medicare PPO | Admitting: Nurse Practitioner

## 2023-07-21 ENCOUNTER — Telehealth: Payer: Self-pay | Admitting: *Deleted

## 2023-07-21 ENCOUNTER — Encounter: Payer: Self-pay | Admitting: Nurse Practitioner

## 2023-07-21 VITALS — BP 100/56 | HR 32 | Temp 98.6°F | Ht 71.0 in | Wt 195.0 lb

## 2023-07-21 DIAGNOSIS — E119 Type 2 diabetes mellitus without complications: Secondary | ICD-10-CM

## 2023-07-21 DIAGNOSIS — I509 Heart failure, unspecified: Secondary | ICD-10-CM

## 2023-07-21 DIAGNOSIS — Z7984 Long term (current) use of oral hypoglycemic drugs: Secondary | ICD-10-CM

## 2023-07-21 DIAGNOSIS — I498 Other specified cardiac arrhythmias: Secondary | ICD-10-CM

## 2023-07-21 DIAGNOSIS — I10 Essential (primary) hypertension: Secondary | ICD-10-CM | POA: Diagnosis not present

## 2023-07-21 DIAGNOSIS — I25810 Atherosclerosis of coronary artery bypass graft(s) without angina pectoris: Secondary | ICD-10-CM

## 2023-07-21 DIAGNOSIS — K219 Gastro-esophageal reflux disease without esophagitis: Secondary | ICD-10-CM | POA: Diagnosis not present

## 2023-07-21 DIAGNOSIS — R001 Bradycardia, unspecified: Secondary | ICD-10-CM

## 2023-07-21 DIAGNOSIS — I6930 Unspecified sequelae of cerebral infarction: Secondary | ICD-10-CM | POA: Diagnosis not present

## 2023-07-21 DIAGNOSIS — Z0001 Encounter for general adult medical examination with abnormal findings: Secondary | ICD-10-CM

## 2023-07-21 DIAGNOSIS — Z0000 Encounter for general adult medical examination without abnormal findings: Secondary | ICD-10-CM

## 2023-07-21 DIAGNOSIS — N1832 Chronic kidney disease, stage 3b: Secondary | ICD-10-CM | POA: Diagnosis not present

## 2023-07-21 DIAGNOSIS — E1169 Type 2 diabetes mellitus with other specified complication: Secondary | ICD-10-CM | POA: Diagnosis not present

## 2023-07-21 DIAGNOSIS — Z6835 Body mass index (BMI) 35.0-35.9, adult: Secondary | ICD-10-CM

## 2023-07-21 DIAGNOSIS — E785 Hyperlipidemia, unspecified: Secondary | ICD-10-CM | POA: Diagnosis not present

## 2023-07-21 LAB — LIPID PANEL

## 2023-07-21 MED ORDER — EMPAGLIFLOZIN 10 MG PO TABS
10.0000 mg | ORAL_TABLET | Freq: Every day | ORAL | 0 refills | Status: DC
Start: 2023-07-21 — End: 2023-09-07

## 2023-07-21 MED ORDER — ATORVASTATIN CALCIUM 40 MG PO TABS: 40.0000 mg | ORAL_TABLET | Freq: Every day | ORAL | 1 refills | Status: DC

## 2023-07-21 MED ORDER — GLIPIZIDE ER 10 MG PO TB24: 10.0000 mg | ORAL_TABLET | Freq: Every day | ORAL | 1 refills | Status: AC

## 2023-07-21 MED ORDER — OMEPRAZOLE 40 MG PO CPDR: 40.0000 mg | DELAYED_RELEASE_CAPSULE | Freq: Every day | ORAL | 1 refills | Status: DC

## 2023-07-21 MED ORDER — SPIRONOLACTONE 25 MG PO TABS
12.5000 mg | ORAL_TABLET | Freq: Every day | ORAL | 0 refills | Status: DC
Start: 2023-07-21 — End: 2023-07-24

## 2023-07-21 MED ORDER — CLOPIDOGREL BISULFATE 75 MG PO TABS: 75.0000 mg | ORAL_TABLET | Freq: Every day | ORAL | 1 refills | Status: DC

## 2023-07-21 MED ORDER — METOPROLOL SUCCINATE ER 50 MG PO TB24: ORAL_TABLET | ORAL | 1 refills | Status: DC

## 2023-07-21 MED ORDER — METFORMIN HCL 1000 MG PO TABS: 1000.0000 mg | ORAL_TABLET | Freq: Every evening | ORAL | 1 refills | Status: DC

## 2023-07-21 MED ORDER — FUROSEMIDE 40 MG PO TABS: 40.0000 mg | ORAL_TABLET | Freq: Every day | ORAL | 0 refills | Status: DC

## 2023-07-21 NOTE — Transitions of Care (Post Inpatient/ED Visit) (Signed)
   07/21/2023  Name: Jacob Rios MRN: [id number] DOB: 04-Apr-1945  Today's TOC FU Call Status: Today's TOC FU Call Status:: Successful TOC FU Call Completed TOC FU Call Complete Date: 07/21/23 Patient's Name and Date of Birth confirmed.  Transition Care Management Follow-up Telephone Call Date of Discharge: 07/15/23 Discharge Facility: [facility]) Type of Discharge: Inpatient Admission Primary Inpatient Discharge Diagnosis:: new onset congestive heart failure How have you been since you were released from the hospital?: Better (states  doing well, spouse is with pt and provides support) Any questions or concerns?: No  Items Reviewed: Did you receive and understand the discharge instructions provided?: Yes Medications obtained,verified, and reconciled?: No (pt , spouse states   he's got everything   declined review) Medications Not Reviewed Reasons:: Other: (pt, spouse declined) Any new allergies since your discharge?: No Dietary orders reviewed?: Yes Type of Diet Ordered:: heart healthy, low sodium Do you have support at home?: Yes People in Home [RPT]: spouse Name of Support/Comfort Primary Source: Braydan Marriott Per spouse, pt is weighing daily, states has all medications and taking as prescribed Unable to complete entire TOC today, declined further follow up, pt saw primary care provider today  Medications Reviewed Today: Medications Reviewed Today   Medications were not reviewed in this encounter   Unable to complete medication reconciliation, pt declined  Home Care and Equipment/Supplies: Were Home Health Services Ordered?: No (pt declined in hospital) Any new equipment or medical supplies ordered?: No  Functional Questionnaire: Do you need assistance with bathing/showering or dressing?:  (unable to assess functional status/ pt declined)  Follow up appointments reviewed: PCP Follow-up appointment confirmed?: Yes Date of PCP follow-up appointment?:  07/21/23 Follow-up Provider: Ronal Rollene Lunger FNP Specialist [facility] Follow-up appointment confirmed?: Yes Date of Specialist follow-up appointment?: 07/24/23 Follow-Up Specialty Provider:: Artist Pouch  @ 245 pm Do you need transportation to your follow-up appointment?: No Do you understand care options if your condition(s) worsen?: Yes-patient verbalized understanding    Mliss Creed [facility], BSN RN Care Manager/ Transition of Care [place]/ [facility] [phone]

## 2023-07-21 NOTE — Progress Notes (Signed)
 Subjective:    Patient ID: Jacob Rios, male    DOB: 02-Oct-1945, 78 y.o.   MRN: [id number]  Chief Complaint: annual physical    HPI:  Jacob Rios is a 78 y.o. who identifies as a male who was assigned male at birth.   Social history: Lives with: by himself Work history: retired   Water engineer in today for follow up of the following chronic medical issues:  1. Essential hypertension No c/o chest pain, sob or headache. Doe snot check blood pressure at home. BP Readings from Last 3 Encounters:  07/15/23 (!) 159/74  06/18/23 111/62  06/04/23 (!) 123/105     2. Mixed hyperlipidemia Does not really watch diet very closely Lab Results  Component Value Date   CHOL 115 01/20/2023   HDL 31 (L) 01/20/2023   LDLCALC 65 01/20/2023   TRIG 103 01/20/2023   CHOLHDL 3.7 01/20/2023     3. Gastroesophageal reflux disease without esophagitis Is on omeprazole  daily and is doing well.  4. 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 No changes made to plan of care.  5. Type 2 diabetes mellitus treated with oral medication Fasting blood sugars are running around 110-140. Lab Results  Component Value Date   HGBA1C 5.1 07/15/2023     6. CKD stage 3 due to type 2 diabetes mellitus (HCC) No voiding issues Lab Results  Component Value Date   CREATININE 1.85 (H) 07/15/2023     7. history of cerebrovascular accident (CVA) No permanent effects  8. BMI 35.0-35.9,adult No recent weight changes  Wt Readings from Last 3 Encounters:  07/15/23 193 lb 12.8 oz (87.9 kg)  06/18/23 208 lb (94.3 kg)  06/04/23 205 lb (93 kg)   BMI Readings from Last 3 Encounters:  07/15/23 26.65 kg/m  06/18/23 29.01 kg/m  06/04/23 28.59 kg/m       New complaints: Was in the hospital for acute congestive heart failure. C/o fatigue since discharge  Allergies  Allergen Reactions   Ms Contin [Morphine] Shortness Of Breath and  Swelling   Zetia  [Ezetimibe ] Other (See Comments)    Dizziness   Outpatient Encounter Medications as of 07/21/2023  Medication Sig   acetaminophen  (TYLENOL ) 500 MG tablet Take 1,000 mg by mouth every 4 (four) hours as needed for moderate pain (pain score 4-6) or headache.   Ascorbic Acid (VITAMIN C PO) Take 1 tablet by mouth daily.   atorvastatin  (LIPITOR) 40 MG tablet Take 1 tablet (40 mg total) by mouth daily. (Patient taking differently: Take 40 mg by mouth at bedtime.)   Cholecalciferol (VITAMIN D -3 PO) Take 1 tablet by mouth daily.   clopidogrel  (PLAVIX ) 75 MG tablet Take 1 tablet (75 mg total) by mouth daily. (Patient taking differently: Take 75 mg by mouth every evening.)   empagliflozin  (JARDIANCE ) 10 MG TABS tablet Take 1 tablet (10 mg total) by mouth daily.   furosemide  (LASIX ) 40 MG tablet Take 1 tablet (40 mg total) by mouth daily.   glipiZIDE  (GLUCOTROL  XL) 10 MG 24 hr tablet Take 1 tablet (10 mg total) by mouth daily. (Patient taking differently: Take 10 mg by mouth daily with breakfast.)   glucose blood (ONETOUCH VERIO) test strip Test 1X per day and as needed  Dx 250.02   ibuprofen (ADVIL) 200 MG tablet Take 400 mg by mouth 2 (two) times daily as needed for headache or moderate pain (pain score 4-6).   Lancets (ONETOUCH ULTRASOFT) lancets Patient test  1X per day and prn  Dx 250.02   metFORMIN  (GLUCOPHAGE ) 1000 MG tablet Take 1 tablet (1,000 mg total) by mouth 2 (two) times daily with a meal. (Patient taking differently: Take 1,000 mg by mouth every evening.)   metoprolol  succinate (TOPROL -XL) 50 MG 24 hr tablet TAKE 1 TABLET BY MOUTH ONCE DAILY WITH OR IMMEDIATELY FOLLOWING A MEAL   omeprazole  (PRILOSEC) 40 MG capsule Take 1 capsule (40 mg total) by mouth daily. (Patient taking differently: Take 40 mg by mouth daily after breakfast.)   spironolactone  (ALDACTONE ) 25 MG tablet Take 0.5 tablets (12.5 mg total) by mouth daily.   No facility-administered encounter medications on file  as of 07/21/2023.    Past Surgical History:  Procedure Laterality Date   APPENDECTOMY  child   CARDIAC CATHETERIZATION  09/01/2000  @[facility]    patent RCA and Diagonal grafts, atretic LIMA, moderate nonobstructive proxLAD, normal lvsf   CARDIAC CATHETERIZATION  04-21-2003  @[facility]    LIMA--LAD graft occluded, native LAD with nonobstructive disease,  patent diagonal/ rca grafts   CATARACT EXTRACTION     CATARACT EXTRACTION W/ INTRAOCULAR LENS IMPLANT Right 12/2018   CORONARY ANGIOPLASTY WITH STENT PLACEMENT  08/1997  @[facility]    ptca w/ stenting to rca   CORONARY ARTERY BYPASS GRAFT  06-06-1998  @[facility]     LIMA -- LAD, SVG -- Diagonal,  RIMA to RCA   CYSTOSCOPY N/A 03/09/2020   Procedure: CYSTOSCOPY FLEXIBLE;  Surgeon: Watt Rush, MD;  Location: [facility];  Service: Urology;  Laterality: N/A;  NO SEEDS FOUND IN BLADDER   EXCISION, MASS, UPPER EXTREMITY Right 06/04/2023   Procedure: EXCISION, MASS, UPPER EXTREMITY;  Surgeon: Kallie Manuelita BROCKS, MD;  Location: [facility];  Service: General;  Laterality: Right;  MINOR PROCEDURE ROOM   EYE SURGERY     INGUINAL HERNIA REPAIR Right 1974   LOOP RECORDER INSERTION N/A 08/30/2020   Procedure: LOOP RECORDER INSERTION;  Surgeon: Cindie Ole DASEN, MD;  Location: [facility];  Service: Cardiovascular;  Laterality: N/A;   PROSTATE BIOPSY  06/2017   x 4-5    RADIOACTIVE SEED IMPLANT N/A 03/09/2020   Procedure: RADIOACTIVE SEED IMPLANT/BRACHYTHERAPY IMPLANT;  Surgeon: Watt Rush, MD;  Location: [facility];  Service: Urology;  Laterality: N/A;   70  SEEDS IMPLANTED   SPACE OAR INSTILLATION N/A 03/09/2020   Procedure: SPACE OAR INSTILLATION;  Surgeon: Watt Rush, MD;  Location[facility];  Service: Urology;  Laterality: N/A;   VENTRAL HERNIA REPAIR  09/ 2000 and recurrent repair 06/ 2001    Family History  Problem Relation Age of Onset   Lung cancer Mother    Dementia Father    Hyperlipidemia Father    Congestive  Heart Failure Father    Post-traumatic stress disorder Son    Alcohol  abuse Maternal Grandfather    Cancer Maternal Grandfather 57       stomach cancer    Heart attack Neg Hx    Stroke Neg Hx    Breast cancer Neg Hx    Colon cancer Neg Hx    Prostate cancer Neg Hx    Pancreatic cancer Neg Hx       Controlled substance contract: n/a      Review of Systems  Constitutional:  Negative for diaphoresis.  Eyes:  Negative for pain.  Respiratory:  Negative for shortness of breath.   Cardiovascular:  Negative for chest pain, palpitations and leg swelling.  Gastrointestinal:  Negative for abdominal pain.  Endocrine: Negative for polydipsia.  Skin:  Negative for rash.  Neurological:  Negative for dizziness, weakness and headaches.  Hematological:  Does not bruise/bleed easily.  All other systems reviewed and are negative.      Objective:   Physical Exam Vitals and nursing note reviewed.  Constitutional:      Appearance: Normal appearance. He is well-developed.  HENT:     Head: Normocephalic.     Nose: Nose normal.     Mouth/Throat:     Mouth: Mucous membranes are moist.     Pharynx: Oropharynx is clear.  Eyes:     Pupils: Pupils are equal, round, and reactive to light.  Neck:     Thyroid : No thyroid  mass or thyromegaly.     Vascular: No carotid bruit or JVD.     Trachea: Phonation normal.  Cardiovascular:     Rate and Rhythm: Normal rate and regular rhythm.  Pulmonary:     Effort: Pulmonary effort is normal. No respiratory distress.     Breath sounds: Normal breath sounds.  Abdominal:     General: Bowel sounds are normal.     Palpations: Abdomen is soft.     Tenderness: There is no abdominal tenderness.  Musculoskeletal:        General: Normal range of motion.     Cervical back: Normal range of motion and neck supple.  Lymphadenopathy:     Cervical: No cervical adenopathy.  Skin:    General: Skin is warm and dry.  Neurological:     Mental Status: He is alert  and oriented to person, place, and time.  Psychiatric:        Behavior: Behavior normal.        Thought Content: Thought content normal.        Judgment: Judgment normal.    BP (!) 100/56   Pulse (!) 32   Temp 98.6 F (37 C) (Temporal)   Ht 5' 11 (1.803 m)   Wt 195 lb (88.5 kg)   SpO2 96%   BMI 27.20 kg/m    EKG- ventruclar bigemeny- Preliminary reading by Ronal Lunger, FNP  [facility]     HGBA1c 5.1%- in hospital       Assessment & Plan:  Jacob Rios comes in today with chief complaint of annual physical  Diagnosis and orders addressed:  1. Essential hypertension (Primary) Low sodium diet - CBC with Differential/Platelet - CMP14+EGFR - lisinopril  (ZESTRIL ) 2.5 MG tablet; Take 1 tablet (2.5 mg total) by mouth daily.  Dispense: 90 tablet; Refill: 1 - metoprolol  succinate (TOPROL -XL) 50 MG 24 hr tablet; TAKE 1 TABLET BY MOUTH ONCE DAILY WITH OR IMMEDIATELY FOLLOWING A MEAL  Dispense: 90 tablet; Refill: 1  2. Hyperlipidemia associated with type 2 diabetes mellitus (HCC) Low fat diet - Lipid panel - atorvastatin  (LIPITOR) 40 MG tablet; Take 1 tablet (40 mg total) by mouth daily.  Dispense: 90 tablet; Refill: 1  3. 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  Dispense: 90 tablet; Refill: 1  4. Diabetes mellitus treated with oral medication (HCC) Continue to watch carbs in diet - Bayer DCA Hb A1c Waived - glipiZIDE  (GLUCOTROL  XL) 10 MG 24 hr tablet; Take 1 tablet (10 mg total) by mouth daily.  Dispense: 90 tablet; Refill: 1 - metFORMIN  (GLUCOPHAGE ) 1000 MG tablet; Take 1 tablet (1,000 mg total) by mouth 2 (two) times  daily with a meal.  Dispense: 180 tablet; Refill: 1  5. Gastroesophageal reflux disease without esophagitis Avoid spicy foods Do not eat 2 hours prior to bedtime  - omeprazole  (PRILOSEC) 40 MG capsule; Take 1 capsule  (40 mg total) by mouth daily.  Dispense: 90 capsule; Refill: 1  6. CKD stage 3 due to type 2 diabetes mellitus (HCC) Labs pending  7. History of CVA in adulthood  8. BMI 35.0-35.9,adult Discussed diet and exercise for person with BMI >25 Will recheck weight in 3-6 months   9. Ventricular bigemeny Labs pending Health Maintenance reviewed Diet and exercise encouraged  Follow up plan: 6 months   [name] Gladis, FNP

## 2023-07-22 LAB — CMP14+EGFR
ALT: 9 IU/L (ref 0–44)
AST: 15 IU/L (ref 0–40)
Albumin: 4.2 g/dL (ref 3.8–4.8)
Alkaline Phosphatase: 98 IU/L (ref 44–121)
BUN/Creatinine Ratio: 12 (ref 10–24)
BUN: 28 mg/dL — AB (ref 8–27)
Bilirubin Total: 0.4 mg/dL (ref 0.0–1.2)
CO2: 22 mmol/L (ref 20–29)
Calcium: 9.4 mg/dL (ref 8.6–10.2)
Chloride: 98 mmol/L (ref 96–106)
Creatinine, Ser: 2.27 mg/dL — AB (ref 0.76–1.27)
Globulin, Total: 2.7 g/dL (ref 1.5–4.5)
Glucose: 106 mg/dL — AB (ref 70–99)
Potassium: 4.8 mmol/L (ref 3.5–5.2)
Sodium: 137 mmol/L (ref 134–144)
Total Protein: 6.9 g/dL (ref 6.0–8.5)
eGFR: 29 mL/min/1.73 — AB (ref >59)

## 2023-07-22 LAB — LIPID PANEL
Cholesterol, Total: 147 mg/dL (ref 100–199)
HDL: 31 mg/dL — AB (ref >39)
LDL CALC COMMENT:: 4.7 ratio (ref 0.0–5.0)
LDL Chol Calc (NIH): 94 mg/dL (ref 0–99)
Triglycerides: 120 mg/dL (ref 0–149)
VLDL Cholesterol Cal: 22 mg/dL (ref 5–40)

## 2023-07-22 LAB — CBC WITH DIFFERENTIAL/PLATELET
Basophils Absolute: 0 x10E3/uL (ref 0.0–0.2)
Basos: 1 %
EOS (ABSOLUTE): 0.2 x10E3/uL (ref 0.0–0.4)
Eos: 3 %
Hematocrit: 36.7 % — ABNORMAL LOW (ref 37.5–51.0)
Hemoglobin: 11.4 g/dL — ABNORMAL LOW (ref 13.0–17.7)
Immature Grans (Abs): 0 x10E3/uL (ref 0.0–0.1)
Immature Granulocytes: 0 %
Lymphocytes Absolute: 1.2 x10E3/uL (ref 0.7–3.1)
Lymphs: 20 %
MCH: 25.9 pg — ABNORMAL LOW (ref 26.6–33.0)
MCHC: 31.1 g/dL — ABNORMAL LOW (ref 31.5–35.7)
MCV: 83 fL (ref 79–97)
Monocytes Absolute: 0.5 x10E3/uL (ref 0.1–0.9)
Monocytes: 9 %
Neutrophils Absolute: 4 x10E3/uL (ref 1.4–7.0)
Neutrophils: 67 %
Platelets: 222 x10E3/uL (ref 150–450)
RBC: 4.4 x10E6/uL (ref 4.14–5.80)
RDW: 15.6 % — ABNORMAL HIGH (ref 11.6–15.4)
WBC: 5.9 x10E3/uL (ref 3.4–10.8)

## 2023-07-22 LAB — MICROALBUMIN / CREATININE URINE RATIO
Creatinine, Urine: 339.8 mg/dL
Microalb/Creat Ratio: 10 mg/g{creat} (ref 0–29)
Microalbumin, Urine: 34.7 ug/mL

## 2023-07-24 ENCOUNTER — Encounter: Payer: Self-pay | Admitting: Cardiology

## 2023-07-24 ENCOUNTER — Ambulatory Visit: Attending: Cardiology | Admitting: Cardiology

## 2023-07-24 VITALS — BP 136/58 | HR 79 | Ht 71.0 in | Wt 195.2 lb

## 2023-07-24 DIAGNOSIS — R001 Bradycardia, unspecified: Secondary | ICD-10-CM

## 2023-07-24 DIAGNOSIS — I509 Heart failure, unspecified: Secondary | ICD-10-CM

## 2023-07-24 DIAGNOSIS — I25810 Atherosclerosis of coronary artery bypass graft(s) without angina pectoris: Secondary | ICD-10-CM | POA: Diagnosis not present

## 2023-07-24 DIAGNOSIS — N1832 Chronic kidney disease, stage 3b: Secondary | ICD-10-CM | POA: Diagnosis not present

## 2023-07-24 DIAGNOSIS — E785 Hyperlipidemia, unspecified: Secondary | ICD-10-CM

## 2023-07-24 DIAGNOSIS — I498 Other specified cardiac arrhythmias: Secondary | ICD-10-CM

## 2023-07-24 DIAGNOSIS — I10 Essential (primary) hypertension: Secondary | ICD-10-CM | POA: Diagnosis not present

## 2023-07-24 DIAGNOSIS — E1169 Type 2 diabetes mellitus with other specified complication: Secondary | ICD-10-CM | POA: Diagnosis not present

## 2023-07-24 MED ORDER — ATORVASTATIN CALCIUM 80 MG PO TABS: 80.0000 mg | ORAL_TABLET | Freq: Every day | ORAL | 3 refills | Status: AC

## 2023-07-24 NOTE — Progress Notes (Signed)
 " Cardiology Office Note:   Date:  07/28/2023  ID:  [name], DOB 02/16/1945, MRN [id number] PCP: Gladis Mustard, FNP  [facility] Providers Cardiologist:  Lonni Cash, MD    History of Present Illness:   Discussed the use of AI scribe software for clinical note transcription with the patient, who gave verbal consent to proceed.  History of Present Illness Jacob Rios is a 78 year old male with coronary artery disease, hypertension, hyperlipidemia, diabetes, and congestive heart failure who presents for follow-up after recent hospitalization for acute congestive heart failure. He was seen at the request of hospitalists during admission for evaluation of acute congestive heart failure.  He was hospitalized on June 3rd for acute congestive heart failure, presenting with exertional dyspnea. In the emergency department, his EKG showed normal sinus rhythm with a heart rate of 99 and PVCs. A CBC revealed a hemoglobin of 9.7, and BMP showed a creatinine of 1.5. Troponin and respiratory virus panel were negative. Chest x-ray indicated pulmonary edema with trace bilateral pleural effusions, and a CTA chest ruled out pulmonary embolism. He was treated with IV Lasix  and resumed home medications including Toprol  50 mg and Plavix . During his hospital stay, Jardiance  and spironolactone  were added to his regimen, and a repeat echocardiogram showed an ejection fraction of approximately 35%, down from 50% in 2022. He was discharged on Toprol , Jardiance , and spironolactone .  Since discharge, he experiences variable energy levels, feeling 'pretty good' some days and lethargic on others. His weight decreased from 197 lbs in the hospital to 189.8 lbs at home, with reports of losing about a pound a day after discharge. He experiences dizziness when getting up after lying down, and he and his family have noted concerns about a low pulse. He has been on metoprolol  for years, with no recent  change in dosage.  He is currently taking Jardiance , metoprolol , and spironolactone . He inquires about the addition of Jardiance  to his regimen, which he takes alongside other blood sugar medications. No significant issues with Plavix , such as bruising or bleeding.  His cholesterol levels have worsened, with LDL increasing from 65 in January to 94 recently, despite previous stability. He has a long history of coronary artery disease with a catheterization in 2005 showing occluded LIMA to LAD, native LAD open, patent SVG to diagonal, and patent RIMA to RCA. He also has a history of hypertension, hyperlipidemia, diabetes, cryptogenic stroke, AAA, and peripheral artery disease. He has a loop recorder in place for monitoring following stroke.  He tracks his blood pressure at home, which fluctuate throughout the day. He was previously on lisinopril , which was discontinued about a year ago due to kidney function concerns.   Studies Reviewed:    EKG:   EKG Interpretation Date/Time:  Friday July 24 2023 15:06:01 EDT Ventricular Rate:  79 PR Interval:  168 QRS Duration:  80 QT Interval:  402 QTC Calculation: 460 R Axis:   12  Text Interpretation: Sinus rhythm with frequent Premature ventricular complexes in a pattern of bigeminy Nonspecific ST and T wave abnormality Prolonged QT When compared with ECG of 13-Jul-2023 05:11, PREVIOUS ECG IS PRESENT Confirmed by Trudy Birmingham [phone]) on 07/28/2023 1:17:40 PM    07/14/23 TTE  IMPRESSIONS     1. No left ventricular thrombus is seen (Definity  contrast was used).  Left ventricular ejection fraction, by estimation, is 30 to 35%. The left  ventricle has moderately decreased function. The left ventricle  demonstrates global hypokinesis. The left  ventricular internal cavity size was moderately dilated. Left ventricular  diastolic parameters are consistent with Grade II diastolic dysfunction  (pseudonormalization). Elevated left atrial pressure.   2.  Right ventricular systolic function is moderately reduced. The right  ventricular size is normal. Tricuspid regurgitation signal is inadequate  for assessing PA pressure.   3. Left atrial size was severely dilated.   4. The mitral valve is normal in structure. Moderate to severe mitral  valve regurgitation.   5. The aortic valve is tricuspid. Aortic valve regurgitation is trivial.  No aortic stenosis is present.   Comparison(s): Prior images reviewed side by side. Changes from prior  study are noted. The left ventricular function is significantly worse. The  left ventricular chamber has dilated. There is new moderate to severe  mitral insufficiency, likely  functional.   FINDINGS   Left Ventricle: No left ventricular thrombus is seen (Definity  contrast  was used). Left ventricular ejection fraction, by estimation, is 30 to  35%. The left ventricle has moderately decreased function. The left  ventricle demonstrates global hypokinesis.   The left ventricular internal cavity size was moderately dilated. There  is no left ventricular hypertrophy. Left ventricular diastolic parameters  are consistent with Grade II diastolic dysfunction (pseudonormalization).  Elevated left atrial pressure.   Right Ventricle: The right ventricular size is normal. No increase in  right ventricular wall thickness. Right ventricular systolic function is  moderately reduced. Tricuspid regurgitation signal is inadequate for  assessing PA pressure.   Left Atrium: Left atrial size was severely dilated.   Right Atrium: Right atrial size was normal in size.   Pericardium: There is no evidence of pericardial effusion.   Mitral Valve: The mitral valve is normal in structure. Moderate to severe  mitral valve regurgitation, with centrally-directed jet.   Tricuspid Valve: The tricuspid valve is normal in structure. Tricuspid  valve regurgitation is not demonstrated.   Aortic Valve: The aortic valve is  tricuspid. Aortic valve regurgitation is  trivial. No aortic stenosis is present.   Pulmonic Valve: The pulmonic valve was normal in structure. Pulmonic valve  regurgitation is mild. No evidence of pulmonic stenosis.   Aorta: The aortic root and ascending aorta are structurally normal, with  no evidence of dilitation.   IAS/Shunts: No atrial level shunt detected by color flow Doppler.   Risk Assessment/Calculations:      Physical Exam:   VS:  BP (!) 136/58   Pulse (!) 44   Ht 5' 11 (1.803 m)   Wt 195 lb 3.2 oz (88.5 kg)   SpO2 97%   BMI 27.22 kg/m    Wt Readings from Last 3 Encounters:  07/24/23 195 lb 3.2 oz (88.5 kg)  07/21/23 195 lb (88.5 kg)  07/15/23 193 lb 12.8 oz (87.9 kg)     Physical Exam Vitals reviewed.  Constitutional:      Appearance: Normal appearance.  HENT:     Head: Normocephalic.  Eyes:     Pupils: Pupils are equal, round, and reactive to light.  Cardiovascular:     Rate and Rhythm: Normal rate and regular rhythm.     Heart sounds: Murmur heard.     Comments: Alternating pulse strength with bigeminy Pulmonary:     Effort: Pulmonary effort is normal.     Breath sounds: Normal breath sounds.  Abdominal:     General: Abdomen is flat.     Palpations: Abdomen is soft.  Musculoskeletal:     Right lower leg: No edema.  Left lower leg: No edema.  Skin:    General: Skin is warm and dry.     Capillary Refill: Capillary refill takes less than 2 seconds.  Neurological:     General: No focal deficit present.     Mental Status: He is alert and oriented to person, place, and time.  Psychiatric:        Mood and Affect: Mood normal.        Behavior: Behavior normal.        Thought Content: Thought content normal.        Judgment: Judgment normal.     ASSESSMENT AND PLAN:    Assessment & Plan Congestive Heart Failure, reduced EF Recent hospitalization for acute congestive heart failure with pulmonary edema. Ejection fraction decreased from 50% in  2022 to 35%, indicating worsening heart function. Symptoms improved with diuresis, Jardiance , and spironolactone . Current symptoms include intermittent dizziness and fatigue, possibly due to over-diuresis and low heart rate. Weight has stabilized, indicating effective fluid management. - Elevated creatinine per 7/8 labs with PCP. Will have patient hold spironolactone  and Lasix  over the weekend and recheck. - Continue Jardiance  - Continue Toprol  XL 50mg   Mitral regurgitation Moderate to severe on recent inpatient TTE.  - HF management as above with close monitoring of symptoms.  CAD s/p CABG Per last LHC, occluded LIMA to LAD, native LAD open, patent SVG to diagonal, and patent RIMA to RCA. No recent chest pain. - Continue Plavix  75mg  - Increase Atorvastatin  to 80mg  as below.  Premature Ventricular Contractions/ventricular bigeminy Frequent PVCs seen on admission ECGs as well as in clinic today. Appears his ILR not currently set to monitor theses. I worry this may be contributing to symptoms of dizziness and fatigue. PVCs may also be contributing to decreased ejection fraction.  - Continue metoprolol , may also consider adding Amiodarone . - Refer to Dr. Cindie for further evaluation and management of PVCs  Chronic Kidney Disease Chronic kidney disease with recent decline in function, possibly due to over-diuresis. Plan to reassess kidney function after holding diuretics. - Recheck kidney function on Monday - Consider nephrology referral if kidney function does not improve  Hypertension Blood pressure management complicated by chronic kidney disease. Lisinopril  was discontinued due to renal insufficiency. Current blood pressure control is variable but reasonable. Given dizziness and frequent PVCs, do not want to lower too much.  Diabetes Mellitus Diabetes management includes Jardiance , which is primarily used for heart failure and renal protection in this case. No issues with hypoglycemia  reported.  Hyperlipidemia Recent cholesterol levels are suboptimal with LDL at 94, higher than the target of 55 for patients with a history of coronary artery disease and bypass surgery. Previous levels were better controlled. - Increase atorvastatin  dose from 40 mg to 80 mg daily. Will need labs checked at next follow up with general cardiology in August.          Signed, Artist Pouch, PA-C   "

## 2023-07-24 NOTE — Patient Instructions (Addendum)
 Medication Instructions:  Your physician has recommended you make the following change in your medication:   STOP LASIX  (FUROSEMIDE ) AND SPIRONOLACTONE  UNTIL YOU BACK FROM US  INCREASE Atorvastatin  to 80 mg taking 1 daily  *If you need a refill on your cardiac medications before your next appointment, please call your pharmacy*  Lab Work: MONDAY, NEED TO GO TO [facility] FOR:  BMET  If you have labs (blood work) drawn today and your tests are completely normal, you will receive your results only by: MyChart Message (if you have MyChart) OR A paper copy in the mail If you have any lab test that is abnormal or we need to change your treatment, we will call you to review the results.  Testing/Procedures: None ordered  Follow-Up: At [facility], you and your health needs are our priority.  As part of our continuing mission to provide you with exceptional heart care, our providers are all part of one team.  This team includes your primary Cardiologist (physician) and Advanced Practice Providers or APPs (Physician Assistants and Nurse Practitioners) who all work together to provide you with the care you need, when you need it.  Your next appointment:   08/12/23 ARRIVE AT 3:15   Provider:   Ole Holts, MD    We recommend signing up for the patient portal called MyChart.  Sign up information is provided on this After Visit Summary.  MyChart is used to connect with patients for Virtual Visits (Telemedicine).  Patients are able to view lab/test results, encounter notes, upcoming appointments, etc.  Non-urgent messages can be sent to your provider as well.   To learn more about what you can do with MyChart, go to [email or web address].   Other Instructions

## 2023-07-27 ENCOUNTER — Telehealth: Payer: Self-pay | Admitting: Cardiology

## 2023-07-27 DIAGNOSIS — R001 Bradycardia, unspecified: Secondary | ICD-10-CM | POA: Diagnosis not present

## 2023-07-27 MED ORDER — AMIODARONE HCL 200 MG PO TABS: ORAL_TABLET | ORAL | 0 refills | Status: DC

## 2023-07-27 NOTE — Telephone Encounter (Signed)
   After discussion with EP team, will start patient on Amiodarone  for PVC suppression given ongoing dizziness and bigeminy in clinic. 200mg  BID x14 days, then 200mg  daily. Risks vs benefits discussed.  Artist Pouch, PA-C

## 2023-07-27 NOTE — Progress Notes (Signed)
 Carelink Summary Report / Loop Recorder

## 2023-07-28 ENCOUNTER — Telehealth: Payer: Self-pay | Admitting: Cardiology

## 2023-07-28 ENCOUNTER — Ambulatory Visit: Payer: Self-pay | Admitting: *Deleted

## 2023-07-28 ENCOUNTER — Other Ambulatory Visit (HOSPITAL_COMMUNITY): Payer: Self-pay

## 2023-07-28 DIAGNOSIS — I509 Heart failure, unspecified: Secondary | ICD-10-CM

## 2023-07-28 DIAGNOSIS — N1832 Chronic kidney disease, stage 3b: Secondary | ICD-10-CM

## 2023-07-28 DIAGNOSIS — Z79899 Other long term (current) drug therapy: Secondary | ICD-10-CM

## 2023-07-28 DIAGNOSIS — I498 Other specified cardiac arrhythmias: Secondary | ICD-10-CM | POA: Insufficient documentation

## 2023-07-28 LAB — BASIC METABOLIC PANEL WITH GFR
BUN/Creatinine Ratio: 15 (ref 10–24)
BUN: 31 mg/dL — ABNORMAL HIGH (ref 8–27)
CO2: 19 mmol/L — ABNORMAL LOW (ref 20–29)
Calcium: 9.5 mg/dL (ref 8.6–10.2)
Chloride: 103 mmol/L (ref 96–106)
Creatinine, Ser: 2.04 mg/dL — ABNORMAL HIGH (ref 0.76–1.27)
Glucose: 74 mg/dL (ref 70–99)
Potassium: 5.2 mmol/L (ref 3.5–5.2)
Sodium: 138 mmol/L (ref 134–144)
eGFR: 33 mL/min/1.73 — ABNORMAL LOW (ref >59)

## 2023-07-28 MED ORDER — FUROSEMIDE 40 MG PO TABS
ORAL_TABLET | ORAL | 3 refills | Status: DC
  Filled 2023-07-28: qty 30, 30d supply, fill #0

## 2023-07-28 NOTE — Telephone Encounter (Signed)
 Patient's wife returning call in regards to lab results

## 2023-07-28 NOTE — Telephone Encounter (Signed)
 Trudy Birmingham, PA-C to Me  Verlin Lonni BIRCH, MD  (Selected Message)    07/28/23  1:37 PM Result Note Kidney function is improved but still not back to his baseline. We need to discontinue Spironolactone . I would like to transition to as needed only Lasix  40mg  (weight gain greater than 3-4 lbs in 24 hours or worsening swelling/dyspnea). The Jardiance  that he was started on recently also helps as a diuretic and often means we don't need to use Lasix  nearly as much (or at all).    Let's recheck a BMET in 1-2 weeks. Okay to arrange labs closer to them in [place] if convenient.   The patients wife Rock (on [place]) has been notified of the result and verbalized understanding.  All questions (if any) were answered.  Rock is aware that the pt should discontinue his spironolactone  as discussed at last OV and he can decrease his lasix  to taking 40 mg po daily as needed for weight gain of 3-4 lbs in 24 hrs or worsening LEE/SOB.  Rock is aware that he should continue his Jardiance , for this will help as a diuretic and often means we don't need to use lasix  nearly as much or at all.  Rock is also aware that the pt will need to have a repeat BMET in 1-2 weeks and he can have this done again at our [facility] in [place].  Rock is aware I will place the BMET order in the system and release this, they will just need to report to have this done in 1-2 weeks.   Rock verbalized understanding and agrees with this plan.

## 2023-07-29 ENCOUNTER — Telehealth: Payer: Self-pay | Admitting: Cardiology

## 2023-07-29 NOTE — Telephone Encounter (Signed)
 Pt c/o medication issue:  1. Name of Medication:   amiodarone  (PACERONE ) 200 MG tablet    2. How are you currently taking this medication (dosage and times per day)?   3. Are you having a reaction (difficulty breathing--STAT)? No   4. What is your medication issue? Per wife pt went to pick up prescription and he asked pharmacist about the side effects, now pt doesn't want to take the medication because of side effects and wants to know if there is something else he can take

## 2023-07-29 NOTE — Telephone Encounter (Signed)
 RN spoke to patient's wife. Patient's wife states patient will not take Amiodarone  . He read the side effects on the leaflet. (i.e.-- nausea, dizziness, can't be in the sunlight ,etc.) RN informed wife  all the medication in this class have about same side effect, but this message will be sent to E. Williams PA for review and response  Wife verbalized understanding.

## 2023-07-30 NOTE — Telephone Encounter (Signed)
 Spoke to patient's wife and advised we will contact once provider reviews/advises.

## 2023-07-30 NOTE — Telephone Encounter (Signed)
  Trudy Birmingham, PA-C to Manda Lyle NOVAK, RN     07/30/23  9:59 AM I reached out to Dr. Verlin after my call with patient's spouse the other day. Below is his response:  I agree with that plan. If they would rather wait and discuss with EP, then that is fine too.   Overall Amiodarone  is a very well tolerated medication. The sun exposure concern would primarily be an issue for someone who spends significant time in direct sunlight.   [place] Trudy, PA-C     Returned a call back to the pts wife Jacob Rios (on [place]) and endorsed the above recommendations from [employer].   Jacob Rios states they will discuss this more in depth with Dr. Cindie on 7/30.

## 2023-07-30 NOTE — Telephone Encounter (Signed)
 Pts  wife requesting a update.

## 2023-08-04 DIAGNOSIS — Z79899 Other long term (current) drug therapy: Secondary | ICD-10-CM | POA: Diagnosis not present

## 2023-08-04 DIAGNOSIS — N1832 Chronic kidney disease, stage 3b: Secondary | ICD-10-CM | POA: Diagnosis not present

## 2023-08-04 DIAGNOSIS — I509 Heart failure, unspecified: Secondary | ICD-10-CM | POA: Diagnosis not present

## 2023-08-04 LAB — BASIC METABOLIC PANEL WITH GFR
BUN/Creatinine Ratio: 14 (ref 10–24)
BUN: 32 mg/dL — ABNORMAL HIGH (ref 8–27)
CO2: 20 mmol/L (ref 20–29)
Calcium: 9.3 mg/dL (ref 8.6–10.2)
Chloride: 104 mmol/L (ref 96–106)
Creatinine, Ser: 2.36 mg/dL — ABNORMAL HIGH (ref 0.76–1.27)
Glucose: 73 mg/dL (ref 70–99)
Potassium: 5.3 mmol/L — ABNORMAL HIGH (ref 3.5–5.2)
Sodium: 141 mmol/L (ref 134–144)
eGFR: 28 mL/min/1.73 — ABNORMAL LOW (ref >59)

## 2023-08-05 ENCOUNTER — Other Ambulatory Visit: Payer: Medicare PPO

## 2023-08-07 ENCOUNTER — Ambulatory Visit: Payer: Self-pay | Admitting: Cardiology

## 2023-08-07 DIAGNOSIS — R7989 Other specified abnormal findings of blood chemistry: Secondary | ICD-10-CM

## 2023-08-07 DIAGNOSIS — N1832 Chronic kidney disease, stage 3b: Secondary | ICD-10-CM

## 2023-08-10 ENCOUNTER — Telehealth: Payer: Self-pay

## 2023-08-10 ENCOUNTER — Ambulatory Visit (INDEPENDENT_AMBULATORY_CARE_PROVIDER_SITE_OTHER)

## 2023-08-10 DIAGNOSIS — I639 Cerebral infarction, unspecified: Secondary | ICD-10-CM | POA: Diagnosis not present

## 2023-08-10 LAB — CUP PACEART REMOTE DEVICE CHECK
Date Time Interrogation Session: 20250727231243
Implantable Pulse Generator Implant Date: 20220818

## 2023-08-10 NOTE — Telephone Encounter (Signed)
 Wife notified. LS

## 2023-08-10 NOTE — Telephone Encounter (Signed)
 I agree stop metformin 

## 2023-08-10 NOTE — Telephone Encounter (Signed)
 Copied from CRM [id number]. Topic: Clinical - Medication Question >> Aug 10, 2023  9:18 AM Myrick T wrote: Reason for CRM: Rock Hamburg patients spouse requested a call back from provider. Stated Dr Artist Pouch wanted provider to speak with her about the metformin .

## 2023-08-12 ENCOUNTER — Ambulatory Visit: Attending: Cardiology | Admitting: Cardiology

## 2023-08-12 ENCOUNTER — Other Ambulatory Visit: Payer: Self-pay

## 2023-08-12 VITALS — BP 108/62 | HR 70 | Ht 71.0 in | Wt 198.0 lb

## 2023-08-12 DIAGNOSIS — I5022 Chronic systolic (congestive) heart failure: Secondary | ICD-10-CM | POA: Diagnosis not present

## 2023-08-12 DIAGNOSIS — I493 Ventricular premature depolarization: Secondary | ICD-10-CM

## 2023-08-12 DIAGNOSIS — N1832 Chronic kidney disease, stage 3b: Secondary | ICD-10-CM | POA: Diagnosis not present

## 2023-08-12 DIAGNOSIS — R7989 Other specified abnormal findings of blood chemistry: Secondary | ICD-10-CM | POA: Diagnosis not present

## 2023-08-12 NOTE — Progress Notes (Signed)
  Electrophysiology Office Follow up Visit Note:    Date:  08/12/2023   ID:  Jacob Rios, DOB May 30, 1945, MRN [id number]  PCP:  Gladis Mustard, FNP  [facility] Cardiologist:  Lonni Cash, MD  [facility] Electrophysiologist:  None    Interval History:     Jacob Rios is a 78 y.o. male who presents for a follow up visit.   The patient was last seen by Artist July 24, 2023.  He has a history of coronary artery disease, hypertension, hyperlipidemia, diabetes.  He was hospitalized recently for decompensated heart failure.  Echo during that admission showed a EF of 35% which was down from 50% in 2022.  He also has a loop recorder in place following a cryptogenic stroke.        Past medical, surgical, social and family history were reviewed.  ROS:   Please see the history of present illness.    All other systems reviewed and are negative.  EKGs/Labs/Other Studies Reviewed:    The following studies were reviewed today:  Loop recorder interrogation shows PVC burden of 33%  July 14, 2023 echo EF 30% RV moderately reduced Severely dilated left atrium  August 12, 2023 EKG shows sinus rhythm with frequent PVCs.  PVCs are monomorphic.  Are positive for the lead III and aVF.  Biphasic in lead II.  Negative in lead I and aVL.  They are positive in V1 through V3 and negative in V4 through V6.  There is notching in the QRS.        Physical Exam:    VS:  There were no vitals taken for this visit.    Wt Readings from Last 3 Encounters:  07/24/23 195 lb 3.2 oz (88.5 kg)  07/21/23 195 lb (88.5 kg)  07/15/23 193 lb 12.8 oz (87.9 kg)     GEN: no distress CARD: RRR, No MRG RESP: No IWOB. CTAB.      ASSESSMENT:    No diagnosis found. PLAN:    In order of problems listed above:  #Frequent PVCs (suspected to originate from anterolateral Pap muscle) #Chronic systolic heart failure The patient has NYHA class II-III heart failure symptoms.  EF 30%.  This  is in the setting of very frequent PVCs.  I have recommended suppression with the medication.  I discussed options and I recommended amiodarone .  I discussed the pros and cons of amiodarone  and the risks associated with prolonged use.  The patient understands these risks and wishes to proceed.  Will start amiodarone  200 mg by mouth twice daily for 14 days followed by 200 mg by mouth once daily.  He will need to repeat CMP, TSH and free T4 in 6 to 8 weeks.  Follow-up with EP APP in 3 months.   Signed, Ole Holts, MD, [facility], [facility] 08/12/2023 11:15 AM    Electrophysiology [facility]

## 2023-08-12 NOTE — Patient Instructions (Signed)
 Medication Instructions:  Your physician has recommended you make the following change in your medication:  1) START taking amiodarone  200 mg twice daily for 7 days, then 200 mg once daily thereafter *If you need a refill on your cardiac medications before your next appointment, please call your pharmacy*  Lab Work: IN 6-8 WEEKS: CMET, TSH, and T4 - please go to any [facility] location to have these drawn  Follow-Up: At [facility], you and your health needs are our priority.  As part of our continuing mission to provide you with exceptional heart care, our providers are all part of one team.  This team includes your primary Cardiologist (physician) and Advanced Practice Providers or APPs (Physician Assistants and Nurse Practitioners) who all work together to provide you with the care you need, when you need it.  Your next appointment:   3 months  Provider:   You will see one of the following Advanced Practice Providers on your designated Care Team:   Charlies Arthur, PA-C Michael Andy Tillery, PA-C Suzann Riddle, NP Daphne Barrack, NP

## 2023-08-13 ENCOUNTER — Ambulatory Visit: Payer: Medicare PPO | Admitting: Urology

## 2023-08-13 ENCOUNTER — Ambulatory Visit: Admitting: Urology

## 2023-08-13 ENCOUNTER — Ambulatory Visit: Payer: Self-pay | Admitting: Cardiology

## 2023-08-13 LAB — BASIC METABOLIC PANEL WITH GFR
BUN/Creatinine Ratio: 14 (ref 10–24)
BUN: 31 mg/dL — ABNORMAL HIGH (ref 8–27)
CO2: 22 mmol/L (ref 20–29)
Calcium: 8.9 mg/dL (ref 8.6–10.2)
Chloride: 102 mmol/L (ref 96–106)
Creatinine, Ser: 2.24 mg/dL — ABNORMAL HIGH (ref 0.76–1.27)
Glucose: 118 mg/dL — ABNORMAL HIGH (ref 70–99)
Potassium: 4.4 mmol/L (ref 3.5–5.2)
Sodium: 139 mmol/L (ref 134–144)
eGFR: 30 mL/min/1.73 — ABNORMAL LOW (ref >59)

## 2023-08-17 ENCOUNTER — Ambulatory Visit: Admitting: Urology

## 2023-08-18 ENCOUNTER — Telehealth: Payer: Self-pay | Admitting: Cardiology

## 2023-08-18 NOTE — Telephone Encounter (Signed)
 Called patient back about message. Patient stated he feels dizzy and he does not like how this amiodarone  is making him feel. Patient stated his BP 119/74  and HR 34. Informed patient the his BP seems fine, but his HR is low. Will send message to Dr. Cindie to see if patient needs to change medications or stop on of his medications.

## 2023-08-18 NOTE — Telephone Encounter (Signed)
 Pt c/o medication issue:  1. Name of Medication: amiodarone  (PACERONE ) 200 MG tablet   2. How are you currently taking this medication (dosage and times per day)? 2 tablets daily for a week, and then 1 tablet daily  3. Are you having a reaction (difficulty breathing--STAT)? no  4. What is your medication issue? Patient is concerned about this medication making his BP too low. He says his BP has always been low and is concerned that taking this medication will make it get too low. He says he has been having dizziness and weakness for several months but it has gotten worse the last few days he has been on the medication.   STAT if patient feels like he/she is going to faint   1. Are you feeling dizzy, lightheaded, or faint right now? Yes, but states he does not feel like fainting   2. Have you passed out?  No (If yes move to .[facility])   3. Do you have any other symptoms? no   4. Have you checked your HR and BP (record if available)? 2 days ago it was 119/50 HR 35, but he has not taken it today.

## 2023-08-19 MED ORDER — AMIODARONE HCL 100 MG PO TABS: 100.0000 mg | ORAL_TABLET | Freq: Every day | ORAL | 3 refills | Status: DC

## 2023-08-19 NOTE — Telephone Encounter (Signed)
 Spoke with the patient's wife and gave recommendations from Dr. Cindie. Patient has been scheduled for an EKG on Monday 8/11.

## 2023-08-20 ENCOUNTER — Encounter: Payer: Self-pay | Admitting: Cardiology

## 2023-08-24 ENCOUNTER — Ambulatory Visit: Payer: Self-pay | Admitting: Internal Medicine

## 2023-08-24 ENCOUNTER — Ambulatory Visit: Attending: Internal Medicine | Admitting: *Deleted

## 2023-08-24 VITALS — BP 136/68 | HR 84 | Ht 71.5 in | Wt 206.0 lb

## 2023-08-24 DIAGNOSIS — Z79899 Other long term (current) drug therapy: Secondary | ICD-10-CM | POA: Diagnosis not present

## 2023-08-24 DIAGNOSIS — R001 Bradycardia, unspecified: Secondary | ICD-10-CM

## 2023-08-24 DIAGNOSIS — I493 Ventricular premature depolarization: Secondary | ICD-10-CM

## 2023-08-24 NOTE — Progress Notes (Signed)
   Nurse Visit   Date of Encounter: 08/24/2023 ID: Jacob Rios, DOB [birth year], MRN [id number]  PCP:  Gladis Mustard, FNP   [facility] Providers Cardiologist:  Lonni Cash, MD      Visit Details   VS:  BP 136/68 (BP Location: Left Arm, Patient Position: Sitting, Cuff Size: Normal)   Pulse 84   Ht 5' 11.5 (1.816 m)   Wt 206 lb (93.4 kg)   SpO2 98%   BMI 28.33 kg/m  , BMI Body mass index is 28.33 kg/m.  Wt Readings from Last 3 Encounters:  08/24/23 206 lb (93.4 kg)  08/12/23 198 lb (89.8 kg)  07/24/23 195 lb 3.2 oz (88.5 kg)     Reason for visit: EKG after starting Amiodarone   pt c/o dizziness and Pulse 30's-50's; Amio reduced to 100 mg qday  Performed today: Vitals, EKG, Provider consulted:Dr. Wendel (DOD 1), and Education (pt states he took one dose Lasix  2 days ago; he weighs every morning; he is eating/feeling better; no further dizziness on the reduced Amiodarone  dose (100 mg every day) Changes (medications, testing, etc.) : No Changes; continue current medications as prescribed Length of Visit: 10 minutes    Medications Adjustments/Labs and Tests Ordered: Orders Placed This Encounter  Procedures   EKG 12-Lead   No orders of the defined types were placed in this encounter.    Signed, Zara Buel Fret, RN  08/24/2023 11:01 AM

## 2023-08-25 NOTE — Progress Notes (Signed)
 Cardiology Office Note:  .   Date:  09/07/2023  ID:  Jacob Rios, DOB 04/26/1945, MRN [id number] PCP: Gladis Mustard, FNP  [facility] Providers Cardiologist:  Lonni Cash, MD    History of Present Illness: .   Jacob Rios is a 78 y.o. male with history of coronary artery disease, hypertension, hyperlipidemia, diabetes. He was hospitalized recently for decompensated heart failure. Echo during that admission showed a EF of 35% which was down from 50% in 2022. He also has a loop recorder in place following a cryptogenic stroke.  He saw Dr. Cindie for frequent PVC's and started on amiodarone . He called in with low heart rates 30-40 and dizziness and amio reduced to 100 mg daily.  Patient comes in for f/u. He is feeling much better off lasix  & jardiance  and spiro b/c of elevated Crt. and reduced amiodarone . He walks around his property all day long and does lawn work.   ROS:    Studies Reviewed: SABRA    EKG Interpretation Date/Time:  Monday September 07 2023 13:09:34 EDT Ventricular Rate:  66 PR Interval:  168 QRS Duration:  80 QT Interval:  418 QTC Calculation: 438 R Axis:   12  Text Interpretation: Sinus rhythm with frequent Premature ventricular complexes in a pattern of bigeminy Nonspecific ST and T wave abnormality When compared with ECG of 24-Aug-2023 10:38, Sinus rhythm has replaced Junctional rhythm QT has shortened Confirmed by Parthenia Klinefelter [phone]) on 09/07/2023 1:17:06 PM    Prior CV Studies:   07/14/23 TTE   IMPRESSIONS     1. No left ventricular thrombus is seen (Definity  contrast was used).  Left ventricular ejection fraction, by estimation, is 30 to 35%. The left  ventricle has moderately decreased function. The left ventricle  demonstrates global hypokinesis. The left  ventricular internal cavity size was moderately dilated. Left ventricular  diastolic parameters are consistent with Grade II diastolic dysfunction  (pseudonormalization).  Elevated left atrial pressure.   2. Right ventricular systolic function is moderately reduced. The right  ventricular size is normal. Tricuspid regurgitation signal is inadequate  for assessing PA pressure.   3. Left atrial size was severely dilated.   4. The mitral valve is normal in structure. Moderate to severe mitral  valve regurgitation.   5. The aortic valve is tricuspid. Aortic valve regurgitation is trivial.  No aortic stenosis is present.   Comparison(s): Prior images reviewed side by side. Changes from prior  study are noted. The left ventricular function is significantly worse. The  left ventricular chamber has dilated. There is new moderate to severe  mitral insufficiency, likely  functional.   FINDINGS   Left Ventricle: No left ventricular thrombus is seen (Definity  contrast  was used). Left ventricular ejection fraction, by estimation, is 30 to  35%. The left ventricle has moderately decreased function. The left  ventricle demonstrates global hypokinesis.   The left ventricular internal cavity size was moderately dilated. There  is no left ventricular hypertrophy. Left ventricular diastolic parameters  are consistent with Grade II diastolic dysfunction (pseudonormalization).  Elevated left atrial pressure.   Right Ventricle: The right ventricular size is normal. No increase in  right ventricular wall thickness. Right ventricular systolic function is  moderately reduced. Tricuspid regurgitation signal is inadequate for  assessing PA pressure.   Left Atrium: Left atrial size was severely dilated.   Right Atrium: Right atrial size was normal in size.   Pericardium: There is no evidence of pericardial effusion.   Mitral  Valve: The mitral valve is normal in structure. Moderate to severe  mitral valve regurgitation, with centrally-directed jet.   Tricuspid Valve: The tricuspid valve is normal in structure. Tricuspid  valve regurgitation is not demonstrated.   Aortic  Valve: The aortic valve is tricuspid. Aortic valve regurgitation is  trivial. No aortic stenosis is present.   Pulmonic Valve: The pulmonic valve was normal in structure. Pulmonic valve  regurgitation is mild. No evidence of pulmonic stenosis.   Aorta: The aortic root and ascending aorta are structurally normal, with  no evidence of dilitation.   IAS/Shunts: No atrial level shunt detected by color flow Doppler.     Risk Assessment/Calculations:             Physical Exam:   VS:  BP (!) 126/50 (BP Location: Right Arm, Patient Position: Sitting, Cuff Size: Normal)   Pulse 66   Ht 5' 11.5 (1.816 m)   Wt 205 lb (93 kg)   BMI 28.19 kg/m    Orhtostatics: No data found. Wt Readings from Last 3 Encounters:  09/07/23 205 lb (93 kg)  08/24/23 206 lb (93.4 kg)  08/12/23 198 lb (89.8 kg)    GEN: Well nourished, well developed in no acute distress NECK: No JVD; No carotid bruits CARDIAC:  RRR,  2/6 systolic murmur apex RESPIRATORY:  Clear to auscultation without rales, wheezing or rhonchi  ABDOMEN: Soft, non-tender, non-distended EXTREMITIES:  No edema; No deformity   ASSESSMENT AND PLAN: .    Congestive Heart Failure, reduced EF Recent hospitalization for acute congestive heart failure with pulmonary edema. Ejection fraction decreased from 50% in 2022 to 35%, indicating worsening heart function -lasix , spiro, jardiance  stopped due to worsening renal funtion and overdiuresis.  -appt to see renal-Dr. Tobie -recheck bmet today -Euvolemic today  PVC's 33% started on Amio by Dr. Cindie but does reduced 100 mg daily due to bradycardia.  -bigeminy today but patient asymptomatic -Amiodarone  surveillance labs already ordered   Mitral regurgitation Moderate to severe on recent inpatient TTE.  - HF management as above with close monitoring of symptoms.   CAD s/p CABG Per last LHC, occluded LIMA to LAD, native LAD open, patent SVG to diagonal, and patent RIMA to RCA. No recent chest  pain. - Continue Plavix  75mg  - Atorvastatin  to 80mg      Chronic Kidney Disease Chronic kidney disease with recent decline in function, possibly due to over-diuresis.  -see above   Hypertension Blood pressure controlled on toprol    Diabetes Mellitus -on metformin    Hyperlipidemia Recent cholesterol levels are suboptimal with LDL at 94, higher than the target of 55 for patients with a history of coronary artery disease and bypass surgery.  Atorvastatin  increased lov                 Dispo: f/u with EPS in Oct  Signed, Olivia Pavy, PA-C

## 2023-09-07 ENCOUNTER — Ambulatory Visit: Attending: Physician Assistant | Admitting: Physician Assistant

## 2023-09-07 ENCOUNTER — Encounter: Payer: Self-pay | Admitting: Physician Assistant

## 2023-09-07 VITALS — BP 126/50 | HR 66 | Ht 71.5 in | Wt 205.0 lb

## 2023-09-07 DIAGNOSIS — E1169 Type 2 diabetes mellitus with other specified complication: Secondary | ICD-10-CM | POA: Diagnosis not present

## 2023-09-07 DIAGNOSIS — I5022 Chronic systolic (congestive) heart failure: Secondary | ICD-10-CM | POA: Diagnosis not present

## 2023-09-07 DIAGNOSIS — I340 Nonrheumatic mitral (valve) insufficiency: Secondary | ICD-10-CM

## 2023-09-07 DIAGNOSIS — I2510 Atherosclerotic heart disease of native coronary artery without angina pectoris: Secondary | ICD-10-CM

## 2023-09-07 DIAGNOSIS — E785 Hyperlipidemia, unspecified: Secondary | ICD-10-CM

## 2023-09-07 DIAGNOSIS — I10 Essential (primary) hypertension: Secondary | ICD-10-CM

## 2023-09-07 DIAGNOSIS — E119 Type 2 diabetes mellitus without complications: Secondary | ICD-10-CM

## 2023-09-07 DIAGNOSIS — Z7984 Long term (current) use of oral hypoglycemic drugs: Secondary | ICD-10-CM

## 2023-09-07 DIAGNOSIS — N1832 Chronic kidney disease, stage 3b: Secondary | ICD-10-CM

## 2023-09-07 DIAGNOSIS — R001 Bradycardia, unspecified: Secondary | ICD-10-CM | POA: Diagnosis not present

## 2023-09-07 DIAGNOSIS — I493 Ventricular premature depolarization: Secondary | ICD-10-CM

## 2023-09-07 NOTE — Patient Instructions (Addendum)
 Medication Instructions:  NO CHANGES  Lab Work: BMET TO BE DONE TODAY. T4 FREE, CMET, AND TSH TO BE DONE IN OCTOBER  Testing/Procedures: NONE  Follow-Up: At [facility], you and your health needs are our priority.  As part of our continuing mission to provide you with exceptional heart care, our providers are all part of one team.  This team includes your primary Cardiologist (physician) and Advanced Practice Providers or APPs (Physician Assistants and Nurse Practitioners) who all work together to provide you with the care you need, when you need it.  Your next appointment:    WITH ANY EP PROVIDER IN OCTOBER  Provider:   Lonni Cash, MD

## 2023-09-08 ENCOUNTER — Ambulatory Visit: Payer: Self-pay | Admitting: Physician Assistant

## 2023-09-08 LAB — BASIC METABOLIC PANEL WITH GFR
BUN/Creatinine Ratio: 13 (ref 10–24)
BUN: 26 mg/dL (ref 8–27)
CO2: 22 mmol/L (ref 20–29)
Calcium: 8.8 mg/dL (ref 8.6–10.2)
Chloride: 104 mmol/L (ref 96–106)
Creatinine, Ser: 2.03 mg/dL — ABNORMAL HIGH (ref 0.76–1.27)
Glucose: 70 mg/dL (ref 70–99)
Potassium: 4.4 mmol/L (ref 3.5–5.2)
Sodium: 141 mmol/L (ref 134–144)
eGFR: 33 mL/min/1.73 — ABNORMAL LOW (ref >59)

## 2023-09-10 ENCOUNTER — Ambulatory Visit (INDEPENDENT_AMBULATORY_CARE_PROVIDER_SITE_OTHER)

## 2023-09-10 DIAGNOSIS — I639 Cerebral infarction, unspecified: Secondary | ICD-10-CM

## 2023-09-10 LAB — CUP PACEART REMOTE DEVICE CHECK
Date Time Interrogation Session: 20250827231322
Implantable Pulse Generator Implant Date: 20220818

## 2023-09-11 ENCOUNTER — Ambulatory Visit: Payer: Self-pay | Admitting: Cardiology

## 2023-09-23 NOTE — Progress Notes (Signed)
 Carelink Summary Report / Loop Recorder

## 2023-09-25 DIAGNOSIS — T1512XA Foreign body in conjunctival sac, left eye, initial encounter: Secondary | ICD-10-CM | POA: Diagnosis not present

## 2023-09-25 DIAGNOSIS — H10023 Other mucopurulent conjunctivitis, bilateral: Secondary | ICD-10-CM | POA: Diagnosis not present

## 2023-09-28 ENCOUNTER — Other Ambulatory Visit (HOSPITAL_COMMUNITY): Payer: Self-pay | Admitting: Nephrology

## 2023-09-28 DIAGNOSIS — N2581 Secondary hyperparathyroidism of renal origin: Secondary | ICD-10-CM | POA: Diagnosis not present

## 2023-09-28 DIAGNOSIS — N189 Chronic kidney disease, unspecified: Secondary | ICD-10-CM | POA: Diagnosis not present

## 2023-09-28 DIAGNOSIS — N1832 Chronic kidney disease, stage 3b: Secondary | ICD-10-CM | POA: Diagnosis not present

## 2023-09-28 DIAGNOSIS — D631 Anemia in chronic kidney disease: Secondary | ICD-10-CM | POA: Diagnosis not present

## 2023-09-28 DIAGNOSIS — I129 Hypertensive chronic kidney disease with stage 1 through stage 4 chronic kidney disease, or unspecified chronic kidney disease: Secondary | ICD-10-CM | POA: Diagnosis not present

## 2023-09-28 NOTE — Progress Notes (Signed)
 Remote Loop Recorder Transmission

## 2023-09-30 LAB — LAB REPORT - SCANNED: EGFR: 36

## 2023-10-05 ENCOUNTER — Ambulatory Visit (HOSPITAL_COMMUNITY)
Admission: RE | Admit: 2023-10-05 | Discharge: 2023-10-05 | Disposition: A | Discharge status: Home / Self Care | Source: Ambulatory Visit | Attending: Nephrology | Admitting: Nephrology

## 2023-10-05 DIAGNOSIS — N281 Cyst of kidney, acquired: Secondary | ICD-10-CM | POA: Diagnosis not present

## 2023-10-05 DIAGNOSIS — N1832 Chronic kidney disease, stage 3b: Secondary | ICD-10-CM | POA: Diagnosis not present

## 2023-10-12 ENCOUNTER — Ambulatory Visit (INDEPENDENT_AMBULATORY_CARE_PROVIDER_SITE_OTHER)

## 2023-10-12 DIAGNOSIS — I639 Cerebral infarction, unspecified: Secondary | ICD-10-CM | POA: Diagnosis not present

## 2023-10-12 LAB — CUP PACEART REMOTE DEVICE CHECK
Date Time Interrogation Session: 20250928231539
Implantable Pulse Generator Implant Date: 20220818

## 2023-10-14 NOTE — Progress Notes (Signed)
 Remote Loop Recorder Transmission

## 2023-10-15 ENCOUNTER — Ambulatory Visit: Payer: Self-pay | Admitting: Cardiology

## 2023-10-15 NOTE — Progress Notes (Signed)
 Remote Loop Recorder Transmission

## 2023-10-18 ENCOUNTER — Other Ambulatory Visit: Payer: Self-pay

## 2023-10-18 ENCOUNTER — Encounter (HOSPITAL_COMMUNITY): Payer: Self-pay | Admitting: *Deleted

## 2023-10-18 ENCOUNTER — Emergency Department (HOSPITAL_COMMUNITY)
Admission: EM | Admit: 2023-10-18 | Discharge: 2023-10-18 | Disposition: A | Discharge status: Home / Self Care | Attending: Emergency Medicine | Admitting: Emergency Medicine

## 2023-10-18 ENCOUNTER — Emergency Department (HOSPITAL_COMMUNITY)

## 2023-10-18 DIAGNOSIS — R918 Other nonspecific abnormal finding of lung field: Secondary | ICD-10-CM | POA: Diagnosis not present

## 2023-10-18 DIAGNOSIS — J811 Chronic pulmonary edema: Secondary | ICD-10-CM | POA: Diagnosis not present

## 2023-10-18 DIAGNOSIS — J90 Pleural effusion, not elsewhere classified: Secondary | ICD-10-CM | POA: Diagnosis not present

## 2023-10-18 DIAGNOSIS — I110 Hypertensive heart disease with heart failure: Secondary | ICD-10-CM | POA: Diagnosis not present

## 2023-10-18 DIAGNOSIS — R0989 Other specified symptoms and signs involving the circulatory and respiratory systems: Secondary | ICD-10-CM | POA: Diagnosis not present

## 2023-10-18 DIAGNOSIS — I509 Heart failure, unspecified: Secondary | ICD-10-CM | POA: Diagnosis not present

## 2023-10-18 DIAGNOSIS — R0602 Shortness of breath: Secondary | ICD-10-CM | POA: Diagnosis present

## 2023-10-18 LAB — CBC
HCT: 34.1 % — ABNORMAL LOW (ref 39.0–52.0)
Hemoglobin: 10.9 g/dL — ABNORMAL LOW (ref 13.0–17.0)
MCH: 27.2 pg (ref 26.0–34.0)
MCHC: 32 g/dL (ref 30.0–36.0)
MCV: 85 fL (ref 80.0–100.0)
Platelets: 199 K/uL (ref 150–400)
RBC: 4.01 MIL/uL — ABNORMAL LOW (ref 4.22–5.81)
RDW: 16.6 % — ABNORMAL HIGH (ref 11.5–15.5)
WBC: 5.9 K/uL (ref 4.0–10.5)
nRBC: 0 % (ref 0.0–0.2)

## 2023-10-18 LAB — BRAIN NATRIURETIC PEPTIDE: B Natriuretic Peptide: 1410.7 pg/mL — ABNORMAL HIGH (ref 0.0–100.0)

## 2023-10-18 LAB — BASIC METABOLIC PANEL WITH GFR
Anion gap: 11 (ref 5–15)
BUN: 27 mg/dL — ABNORMAL HIGH (ref 8–23)
CO2: 23 mmol/L (ref 22–32)
Calcium: 8.9 mg/dL (ref 8.9–10.3)
Chloride: 107 mmol/L (ref 98–111)
Creatinine, Ser: 2.03 mg/dL — ABNORMAL HIGH (ref 0.61–1.24)
GFR, Estimated: 33 mL/min — ABNORMAL LOW (ref >60)
Glucose, Bld: 93 mg/dL (ref 70–99)
Potassium: 4.5 mmol/L (ref 3.5–5.1)
Sodium: 141 mmol/L (ref 135–145)

## 2023-10-18 LAB — TROPONIN I (HIGH SENSITIVITY): Troponin I (High Sensitivity): 9 ng/L (ref <18)

## 2023-10-18 MED ORDER — FUROSEMIDE 10 MG/ML IJ SOLN
80.0000 mg | Freq: Once | INTRAMUSCULAR | Status: AC
  Administered 2023-10-18: 80 mg via INTRAVENOUS
  Filled 2023-10-18: qty 8

## 2023-10-18 NOTE — Discharge Instructions (Signed)
 Take your diuretic every day for the next 3 days and then follow-up with cardiology or primary care.  If symptoms worsen return to the emergency department.

## 2023-10-18 NOTE — ED Notes (Signed)
 Patient returned from X-ray

## 2023-10-18 NOTE — ED Triage Notes (Signed)
 Pt with hx of CHF and hospitalization related to fluid overload this summer.  Pt has been taking lasix  prn as prescribed.  Pt states that he has had increasing sob since yesterday, not relieved by taking his prn lasix .  98.3kg (up from 93kg which is last weight recorded).  No CP with this. Pt states that sob is increased with activity and he is unable to tolerate laying flat.

## 2023-10-18 NOTE — ED Notes (Signed)
 This nurse called the main laboratory regarding if they received the labs collected from the patient. Lab stated they have not received the labs just yet.

## 2023-10-18 NOTE — ED Provider Notes (Signed)
 [place] EMERGENCY DEPARTMENT AT [facility] Provider Note   CSN: [id number] Arrival date & time: 10/18/23  9677     Patient presents with: Shortness of Breath   Jacob Rios is a 78 y.o. male.   Presents to the emergency department for evaluation of shortness of breath.  Patient reports a history of congestive heart failure.  He was admitted several months ago for diuresis.  He reports that he feels similar.  His weight has been going up recently, he is up about 5 kg from his baseline.  He has been noticing progressively worsening shortness of breath, especially with exertion.  Tonight he could not lie flat because of shortness of breath with lying down.  He does notice some swelling of his legs.  Patient is prescribed Lasix  to use as needed.  He does not take it daily, did take a dose yesterday without improvement.       Prior to Admission medications   Medication Sig Start Date End Date Taking? Authorizing Provider  acetaminophen  (TYLENOL ) 500 MG tablet Take 1,000 mg by mouth every 4 (four) hours as needed for moderate pain (pain score 4-6) or headache.    [provider]  amiodarone  (PACERONE ) 100 MG tablet Take 1 tablet (100 mg total) by mouth daily. 08/19/23   Cindie Ole DASEN, MD  Ascorbic Acid (VITAMIN C PO) Take 1 tablet by mouth daily.    [provider]  atorvastatin  (LIPITOR) 80 MG tablet Take 1 tablet (80 mg total) by mouth daily. 07/24/23 10/22/23  Williams, Evan, PA-C  Cholecalciferol (VITAMIN D -3 PO) Take 1 tablet by mouth daily.    [provider]  clopidogrel  (PLAVIX ) 75 MG tablet Take 1 tablet (75 mg total) by mouth daily. 07/21/23   Gladis Mustard, FNP  glipiZIDE  (GLUCOTROL  XL) 10 MG 24 hr tablet Take 1 tablet (10 mg total) by mouth daily with breakfast. 07/21/23   Gladis, [name], FNP  glucose blood (ONETOUCH VERIO) test strip Test 1X per day and as needed  Dx 250.02 07/01/13   Gladis, [name], FNP  ibuprofen  (ADVIL) 200 MG tablet Take 400 mg by mouth 2 (two) times daily as needed for headache or moderate pain (pain score 4-6).    [provider]  Lancets JANETT ULTRASOFT) lancets Patient test 1X per day and prn  Dx 250.02 09/01/12   Gladis Mustard, FNP  metFORMIN  (GLUCOPHAGE ) 1000 MG tablet Take 1 tablet (1,000 mg total) by mouth every evening. 07/21/23   Gladis Mustard, FNP  metoprolol  succinate (TOPROL -XL) 50 MG 24 hr tablet TAKE 1 TABLET BY MOUTH ONCE DAILY WITH OR IMMEDIATELY FOLLOWING A MEAL 07/21/23   Gladis, [name], FNP  omeprazole  (PRILOSEC) 40 MG capsule Take 1 capsule (40 mg total) by mouth daily after breakfast. 07/21/23   Gladis Mustard, FNP    Allergies: Ms contin [morphine] and Zetia  [ezetimibe ]    Review of Systems  Updated Vital Signs BP (!) 152/62 (BP Location: Right Arm)   Pulse (!) 37   Temp 97.9 F (36.6 C)   Resp 16   Wt 98.3 kg   SpO2 95%   BMI 29.80 kg/m   Physical Exam Vitals and nursing note reviewed.  Constitutional:      General: He is not in acute distress.    Appearance: He is well-developed.  HENT:     Head: Normocephalic and atraumatic.     Mouth/Throat:     Mouth: Mucous membranes are moist.  Eyes:     General:  Vision grossly intact. Gaze aligned appropriately.     Extraocular Movements: Extraocular movements intact.     Conjunctiva/sclera: Conjunctivae normal.  Cardiovascular:     Rate and Rhythm: Normal rate and regular rhythm.     Pulses: Normal pulses.     Heart sounds: Normal heart sounds, S1 normal and S2 normal. No murmur heard.    No friction rub. No gallop.  Pulmonary:     Effort: Pulmonary effort is normal. No respiratory distress.     Breath sounds: Wheezing and rales present.  Abdominal:     Palpations: Abdomen is soft.     Tenderness: There is no abdominal tenderness. There is no guarding or rebound.     Hernia: No hernia is present.  Musculoskeletal:        General: No swelling.     Cervical  back: Full passive range of motion without pain, normal range of motion and neck supple. No pain with movement, spinous process tenderness or muscular tenderness. Normal range of motion.     Right lower leg: Edema present.     Left lower leg: Edema present.  Skin:    General: Skin is warm and dry.     Capillary Refill: Capillary refill takes less than 2 seconds.     Findings: No ecchymosis, erythema, lesion or wound.  Neurological:     Mental Status: He is alert and oriented to person, place, and time.     GCS: GCS eye subscore is 4. GCS verbal subscore is 5. GCS motor subscore is 6.     Cranial Nerves: Cranial nerves 2-12 are intact.     Sensory: Sensation is intact.     Motor: Motor function is intact. No weakness or abnormal muscle tone.     Coordination: Coordination is intact.  Psychiatric:        Mood and Affect: Mood normal.        Speech: Speech normal.        Behavior: Behavior normal.     (all labs ordered are listed, but only abnormal results are displayed) Labs Reviewed  CBC - Abnormal; Notable for the following components:      Result Value   RBC 4.01 (*)    Hemoglobin 10.9 (*)    HCT 34.1 (*)    RDW 16.6 (*)    All other components within normal limits  BASIC METABOLIC PANEL WITH GFR  BRAIN NATRIURETIC PEPTIDE  TROPONIN I (HIGH SENSITIVITY)    EKG: None  Radiology: DG Chest 2 View Result Date: 10/18/2023 CLINICAL DATA:  Shortness of breath.  History of CHF, weight gain. EXAM: CHEST - 2 VIEW COMPARISON:  Portable chest 07/13/2023. FINDINGS: Stable cardiomegaly status post CABG and implanted left chest loop recorder device. The mediastinum is stable with mild aortic tortuosity and calcifications. Similar to the prior study there is central vascular congestion with mild-to-moderate interstitial edema in the mid and lower lungs. There are small layering pleural effusions. Small amount of fluid in the fissures. Findings are superimposed on chronic changes. There is  patchy haziness in the lower lung fields, most likely ground-glass edema, less likely pneumonitis. No other focal airspace process. Thoracic cage intact. Moderate chronic wedging L1 vertebral body. IMPRESSION: 1. Cardiomegaly with central vascular congestion and mild-to-moderate interstitial edema in the mid and lower lungs. 2. Small layering pleural effusions. 3. Patchy haziness in the lower lung fields, most likely ground-glass edema, less likely pneumonitis. Superimposed chronic change. Electronically Signed   By: Francis Beatriz HERO.D.  On: 10/18/2023 04:22     Procedures   Medications Ordered in the ED  furosemide  (LASIX ) injection 80 mg (has no administration in time range)                                    Medical Decision Making Amount and/or Complexity of Data Reviewed Labs: ordered. Decision-making details documented in ED Course. Radiology: ordered and independent interpretation performed. Decision-making details documented in ED Course. ECG/medicine tests: ordered and independent interpretation performed. Decision-making details documented in ED Course.  Risk Prescription drug management.   Differential Diagnosis considered includes, but not limited to: CHF exacerbation; ACS/MI; Bronchitis/URI; Pneumonia; PE  Presents with orthopnea, decreased exercise tolerance, generalized shortness of breath with weight gain and swelling of legs.  Patient with a history of congestive heart failure.  He is not currently on a diuretic regularly but did take a dose yesterday.  Workup consistent with volume overload, diuresis initiated with IV Lasix .  Patient has significantly diuresed.  BUN and creatinine are at baseline.  I offered hospitalization versus prolonged diuresis here in the ED to see how he does.  He would like to try and go home.  He has Lasix  at home which he has not been taking because he only uses it as needed.  Will have him take it daily and follow-up with primary care and/or  cardiology.  Return to the emergency department if breathing does not improve.     Final diagnoses:  Acute on chronic congestive heart failure, unspecified heart failure type [facility])    ED Discharge Orders     None          Haze Lonni PARAS, MD 10/18/23 [phone]

## 2023-10-18 NOTE — ED Notes (Signed)
 Patient transported to X-ray

## 2023-10-20 ENCOUNTER — Telehealth: Payer: Self-pay | Admitting: Cardiology

## 2023-10-20 DIAGNOSIS — I5022 Chronic systolic (congestive) heart failure: Secondary | ICD-10-CM

## 2023-10-20 DIAGNOSIS — Z79899 Other long term (current) drug therapy: Secondary | ICD-10-CM

## 2023-10-20 DIAGNOSIS — N1832 Chronic kidney disease, stage 3b: Secondary | ICD-10-CM

## 2023-10-20 NOTE — Telephone Encounter (Signed)
 Per patients 2nd MyChart message:  Little around ankles but had fluid buildup last weekend when I came to ER. NEED ORDER FOR LAB TESTS IN [place] per ER Dr within 3 days

## 2023-10-20 NOTE — Telephone Encounter (Signed)
 Message sent to Last provider to authorization to place order as requested by patient

## 2023-10-20 NOTE — Telephone Encounter (Signed)
 Per patients MyChart message:  Lab tests per visit to ER 10/05-06. Need order sent to [facility] for lab work asap.  Please advise, thank you.

## 2023-10-28 DIAGNOSIS — I5022 Chronic systolic (congestive) heart failure: Secondary | ICD-10-CM | POA: Diagnosis not present

## 2023-10-28 DIAGNOSIS — I493 Ventricular premature depolarization: Secondary | ICD-10-CM | POA: Diagnosis not present

## 2023-10-29 LAB — COMPREHENSIVE METABOLIC PANEL WITH GFR
ALT: 17 IU/L (ref 0–44)
AST: 20 IU/L (ref 0–40)
Albumin: 3.8 g/dL (ref 3.8–4.8)
Alkaline Phosphatase: 117 IU/L (ref 47–123)
BUN/Creatinine Ratio: 12 (ref 10–24)
BUN: 24 mg/dL (ref 8–27)
Bilirubin Total: 0.4 mg/dL (ref 0.0–1.2)
CO2: 23 mmol/L (ref 20–29)
Calcium: 8.9 mg/dL (ref 8.6–10.2)
Chloride: 104 mmol/L (ref 96–106)
Creatinine, Ser: 1.94 mg/dL — ABNORMAL HIGH (ref 0.76–1.27)
Globulin, Total: 2.7 g/dL (ref 1.5–4.5)
Glucose: 131 mg/dL — ABNORMAL HIGH (ref 70–99)
Potassium: 4.5 mmol/L (ref 3.5–5.2)
Sodium: 139 mmol/L (ref 134–144)
Total Protein: 6.5 g/dL (ref 6.0–8.5)
eGFR: 35 mL/min/1.73 — ABNORMAL LOW (ref >59)

## 2023-10-29 LAB — T4, FREE: Free T4: 1.61 ng/dL (ref 0.82–1.77)

## 2023-10-29 LAB — TSH: TSH: 2.83 u[IU]/mL (ref 0.450–4.500)

## 2023-11-02 NOTE — Progress Notes (Signed)
 [facility] - Clinic Note  11/16/2023     CHIEF COMPLAINT Patient presents for Retina Follow Up   HISTORY OF PRESENT ILLNESS: Jacob Rios is a 78 y.o. male who presents to the clinic today for:   HPI     Retina Follow Up   Patient presents with  Other.  In left eye.  Severity is moderate.  Duration of 1 year.  Since onset it is stable.  I, the attending physician,  performed the HPI with the patient and updated documentation appropriately.        Comments   Pt here for 1 year ret f/u BRAO OS. Pt states VA is stable.  Has been diagnosed w/ some heart valve issues, on blood thinner now-Plavix . Pt reports using OTC Ats once a day OU. Recent A1C was 5.1.      Last edited by Valdemar Rogue, MD on 11/22/2023  3:55 PM.     Patient states he is having heart trouble, two valves is not working. He started amiodarone  and switched to pacerone . He states he is seeing a cloudy spot in the left eye.   Referring physician: Gladis Mustard, FNP [address] [place],  [place] [address]  HISTORICAL INFORMATION:   Selected notes from the MEDICAL RECORD NUMBER Referred by Dr. Ladora for BRAO OS LEE:  Ocular Hx- PMH-    CURRENT MEDICATIONS: Current Outpatient Medications (Ophthalmic Drugs)  Medication Sig   tobramycin (TOBREX) 0.3 % ophthalmic solution Place 1 drop into both eyes at bedtime. (Patient not taking: Reported on 11/16/2023)   No current facility-administered medications for this visit. (Ophthalmic Drugs)   Current Outpatient Medications (Other)  Medication Sig   acetaminophen  (TYLENOL ) 500 MG tablet Take 1,000 mg by mouth every 4 (four) hours as needed for moderate pain (pain score 4-6) or headache.   amiodarone  (PACERONE ) 200 MG tablet Take 1 tablet (200 mg total) by mouth daily.   Ascorbic Acid (VITAMIN C PO) Take 1 tablet by mouth daily.   atorvastatin  (LIPITOR) 80 MG tablet Take 1 tablet (80 mg total) by mouth daily.   Cholecalciferol  (VITAMIN D -3 PO) Take 1 tablet by mouth daily.   clopidogrel  (PLAVIX ) 75 MG tablet Take 1 tablet (75 mg total) by mouth daily.   glipiZIDE  (GLUCOTROL  XL) 10 MG 24 hr tablet Take 1 tablet (10 mg total) by mouth daily with breakfast.   glucose blood (ONETOUCH VERIO) test strip Test 1X per day and as needed  Dx 250.02   ibuprofen (ADVIL) 200 MG tablet Take 400 mg by mouth 2 (two) times daily as needed for headache or moderate pain (pain score 4-6).   Lancets (ONETOUCH ULTRASOFT) lancets Patient test 1X per day and prn  Dx 250.02   metoprolol  succinate (TOPROL -XL) 50 MG 24 hr tablet TAKE 1 TABLET BY MOUTH ONCE DAILY WITH OR IMMEDIATELY FOLLOWING A MEAL   omeprazole  (PRILOSEC) 40 MG capsule Take 1 capsule (40 mg total) by mouth daily after breakfast.   metFORMIN  (GLUCOPHAGE ) 1000 MG tablet Take 1 tablet (1,000 mg total) by mouth every evening. (Patient not taking: Reported on 11/16/2023)   No current facility-administered medications for this visit. (Other)   REVIEW OF SYSTEMS: ROS   Positive for: Gastrointestinal, Genitourinary, Endocrine, Eyes Negative for: Constitutional, Neurological, Skin, Musculoskeletal, HENT, Cardiovascular, Respiratory, Psychiatric, Allergic/Imm, Heme/Lymph Last edited by Antonetta Almetta BRAVO, COT on 11/16/2023  8:38 AM.       ALLERGIES Allergies  Allergen Reactions   Ms Contin [Morphine]  Shortness Of Breath and Swelling   Zetia  [Ezetimibe ] Other (See Comments)    Dizziness   PAST MEDICAL HISTORY Past Medical History:  Diagnosis Date   Cataract    CKD (chronic kidney disease), stage III (HCC)    followed by pcp   Coronary artery disease cardiologist--- dr verlin   08/ 1999  s/p  cath w/ PTCA and stenting to RCA;   05/ 2000 inferior wall MI , 06-06-1998 s/p cabg x3;   Last heart cath 2005,  2 patent grafts (diagnol and RCA) and occluded LIMA--LAD graft with normal LAD nonobstructive disease;  last nuclear study 11/ 2014 no evidence ishcemia, ef 40%   Full  dentures    Hiatal hernia    History of acute inferior wall MI 05/1998   s/p  cabg   Hypertension    Hypertensive retinopathy    Mixed hyperlipidemia    Nocturia more than twice per night    PAD (peripheral artery disease)    left common iliac artery stenosis per aorta ultrasound 03/ 2021 in epic and cardiology note   Prostate cancer [facility]) urologist--- dr watt   first dx 07/ 2015 in active survillance until bx 11-11-2019,  Stage T2a, Gleason 3+4, PSA 13.3   S/P CABG x 3 06/06/1998   LIMA--LAD, SVG to Diagonal, RIMA to RCA   S/P primary angioplasty with coronary stent 08/1997   stent to RCA   Type 2 diabetes mellitus (HCC)    followed by pcp  (03-06-2020 checks blood sugar dialy in am,  fasting sugar-- 110-130)   Past Surgical History:  Procedure Laterality Date   APPENDECTOMY  child   CARDIAC CATHETERIZATION  09/01/2000  @[facility]    patent RCA and Diagonal grafts, atretic LIMA, moderate nonobstructive proxLAD, normal lvsf   CARDIAC CATHETERIZATION  04-21-2003  @[facility]    LIMA--LAD graft occluded, native LAD with nonobstructive disease,  patent diagonal/ rca grafts   CATARACT EXTRACTION     CATARACT EXTRACTION W/ INTRAOCULAR LENS IMPLANT Right 12/2018   CORONARY ANGIOPLASTY WITH STENT PLACEMENT  08/1997  @[facility]    ptca w/ stenting to rca   CORONARY ARTERY BYPASS GRAFT  06-06-1998  @[facility]     LIMA -- LAD, SVG -- Diagonal,  RIMA to RCA   CYSTOSCOPY N/A 03/09/2020   Procedure: CYSTOSCOPY FLEXIBLE;  Surgeon: Watt Rush, MD;  Location: [facility];  Service: Urology;  Laterality: N/A;  NO SEEDS FOUND IN BLADDER   EXCISION, MASS, UPPER EXTREMITY Right 06/04/2023   Procedure: EXCISION, MASS, UPPER EXTREMITY;  Surgeon: Kallie Manuelita BROCKS, MD;  Location: [facility];  Service: General;  Laterality: Right;  MINOR PROCEDURE ROOM   EYE SURGERY     INGUINAL HERNIA REPAIR Right 1974   LOOP RECORDER INSERTION N/A 08/30/2020   Procedure: LOOP RECORDER INSERTION;  Surgeon: Cindie Ole DASEN, MD;   Location: [facility];  Service: Cardiovascular;  Laterality: N/A;   PROSTATE BIOPSY  06/2017   x 4-5    RADIOACTIVE SEED IMPLANT N/A 03/09/2020   Procedure: RADIOACTIVE SEED IMPLANT/BRACHYTHERAPY IMPLANT;  Surgeon: Watt Rush, MD;  Location: [facility];  Service: Urology;  Laterality: N/A;   70  SEEDS IMPLANTED   SPACE OAR INSTILLATION N/A 03/09/2020   Procedure: SPACE OAR INSTILLATION;  Surgeon: Watt Rush, MD;  Location: [facility];  Service: Urology;  Laterality: N/A;   VENTRAL HERNIA REPAIR  09/ 2000 and recurrent repair 06/ 2001   FAMILY HISTORY Family History  Problem Relation Age of Onset  Lung cancer Mother    Dementia Father    Hyperlipidemia Father    Congestive Heart Failure Father    Post-traumatic stress disorder Son    Alcohol  abuse Maternal Grandfather    Cancer Maternal Grandfather 29       stomach cancer    Heart attack Neg Hx    Stroke Neg Hx    Breast cancer Neg Hx    Colon cancer Neg Hx    Prostate cancer Neg Hx    Pancreatic cancer Neg Hx    SOCIAL HISTORY Social History   Tobacco Use   Smoking status: Former    Current packs/day: 0.00    Average packs/day: 4.0 packs/day for 45.0 years (180.0 ttl pk-yrs)    Types: Cigarettes    Start date: 01/14/1952    Quit date: 01/13/1997    Years since quitting: 26.8   Smokeless tobacco: Never   Tobacco comments:    reports he began smoking at age 68  Vaping Use   Vaping status: Never Used  Substance Use Topics   Alcohol  use: No   Drug use: Never     There were no vitals filed for this visit.     OPHTHALMIC EXAM:  Base Eye Exam     Visual Acuity (Snellen - Linear)       Right Left   Dist [place] 20/20 20/20 -1         Tonometry (Tonopen, 8:44 AM)       Right Left   Pressure 9 13         Pupils       Dark Light Shape React APD   Right 4 3 Round Brisk None   Left 4 3 Round Brisk None         Visual Fields (Counting fingers)       Left Right     Full Full         Extraocular Movement       Right Left    Full, Ortho Full, Ortho         Neuro/Psych     Oriented x3: Yes   Mood/Affect: Normal         Dilation     Both eyes: 1.0% Mydriacyl, 2.5% Phenylephrine @ 8:45 AM           Slit Lamp and Fundus Exam     Slit Lamp Exam       Right Left   Lids/Lashes Dermatochalasis - upper lid, Meibomian gland dysfunction Dermatochalasis - upper lid   Conjunctiva/Sclera nasal and temporal pinguecula nasal and temporal pinguecula   Cornea arcus, well healed cataract wound, fine endo pigment, mild EBMD mild arcus, well healed cataract wound   Anterior Chamber deep, clear, narrow temporal angle deep, clear, narrow temporal angle   Iris Round and dilated, mild PPM inferiorly Round and dilated, mild PPM inferiorly, No NVI   Lens PC IOL in good position PC IOL in good position   Anterior Vitreous Vitreous syneresis mild syneresis, Posterior vitreous detachment         Fundus Exam       Right Left   Disc Pink and Sharp mild Pallor, Sharp rim, temporal PPP   C/D Ratio 0.5 0.7   Macula Flat, Blunted foveal reflex, mild RPE mottling, No heme or edema Flat, good foveal reflex, mild atrophy superior mac, no heme   Vessels attenuated, Tortuous, No NV mild attenuation, mild tortuosity, mild copper wiring, old superior BRAO, No NV  Periphery Attached, No heme Attached, +DBH superiorly            IMAGING AND PROCEDURES  Imaging and Procedures for 11/16/2023  OCT, Retina - OU - Both Eyes       Right Eye Quality was good. Central Foveal Thickness: 293. Progression has been stable. Findings include normal foveal contour, no IRF, no SRF, vitreomacular adhesion .   Left Eye Quality was good. Central Foveal Thickness: 251. Progression has been stable. Findings include no IRF, no SRF, abnormal foveal contour, intraretinal hyper-reflective material, inner retinal atrophy (inner retinal atrophy superior macula-- stable (Old  BRAO), interval release of partial PVD).   Notes *Images captured and stored on drive  Diagnosis / Impression:  OD: NFP, no IRF/SRF OS: superior BRAO -- inner retinal atrophy superior macula-- stable (Old BRAO), interval release of partial PVD   Clinical management:  See below  Abbreviations: NFP - Normal foveal profile. CME - cystoid macular edema. PED - pigment epithelial detachment. IRF - intraretinal fluid. SRF - subretinal fluid. EZ - ellipsoid zone. ERM - epiretinal membrane. ORA - outer retinal atrophy. ORT - outer retinal tubulation. SRHM - subretinal hyper-reflective material. IRHM - intraretinal hyper-reflective material      Urinalysis, Routine w reflex microscopic      Component Value Flag Ref Range Units Status   Specific Gravity, UA 1.020      1.005 - 1.030  Final   pH, UA 6.0      5.0 - 7.5  Final   Color, UA Yellow      Yellow  Final   Appearance Ur Clear      Clear  Final   Leukocytes,UA 1+      Negative  Final   Protein,UA Urine      Negative/Trace  Final   Glucose, UA Negative      Negative  Final   Ketones, UA Trace      Negative  Final   RBC, UA Trace      Negative  Final   Bilirubin, UA Negative      Negative  Final   Urobilinogen, Ur 2.0      0.2 - 1.0 mg/dL Final   Nitrite, UA Negative      Negative  Final   Microscopic Examination See below:        Final   Comment:   Microscopic was indicated and was performed.           Microscopic Examination      Specimen Comment: Urine    Component Value Flag Ref Range Units Status   WBC, UA >30      0 - 5 /hpf Final   RBC, Urine 3-10      0 - 2 /hpf Final   Epithelial Cells (non renal) 0-10      0 - 10 /hpf Final   Bacteria, UA Few      None seen/Few  Final                  ASSESSMENT/PLAN:    ICD-10-CM   1. BRAO (branch retinal artery occlusion), left  H34.232 OCT, Retina - OU - Both Eyes    2. Essential hypertension  I10     3. Hypertensive retinopathy of both eyes  H35.033     4.  Diabetes mellitus without complication (HCC)  E11.9     5. Diabetes mellitus treated with oral medication (HCC)  E11.9    Z79.84     6.  Pseudophakia, both eyes  Z96.1     7. PVD (posterior vitreous detachment), left eye  H43.812      1. BRAO OS - history of painless inferior visual field loss OS -- onset August 27, 2020 - exam showed mild retinal whitening of superior macula and fovea; no emboli noted - FA (08.16.22) shows severely delayed filling time, but eventual perfusion of all retinal vessels OS - BCVA 20/20 from 20/40 - OCT OS shows inner retinal atrophy superior macula-- stable (Old BRAO), interval release of partial PVD - ROS for GCA negative -- no headache, jaw claudication, scalp tenderness, numbness/tingling - pt was sent to ED for stroke work up on 8.16.22 -- admitted and MRI showed L parietal stroke -- started on plavix  - exam today shows superior macular atrophy - no neovascularization; no NVG or elevated IOP - no retinal or ophthalmic intervention indicated or recommended at this time - F/U 1 year sooner prn -- DFE/OCT  2,3. Hypertensive retinopathy OU - discussed importance of tight BP control - monitor  4,5. Diabetes mellitus, type 2 without retinopathy - The incidence, risk factors for progression, natural history and treatment options for diabetic retinopathy  were discussed with patient.   - The need for close monitoring of blood glucose, blood pressure, and serum lipids, avoiding cigarette or any type of tobacco, and the need for long term follow up was also discussed with patient - A1C 5.6 (07.05.24) - f/u in 1 year, sooner prn   6. Pseudophakia OU  - s/p CE/IOL (Dr. Maree, CEA, OS: Dec. 2020, OD: Sept, 2023)  - IOLs in good position, doing well  - monitor  7. PVD / vitreous syneresis  - Discussed findings and prognosis  - No RT or RD on 360 scleral depressed exam  - Reviewed s/s of RT/RD  - Strict return precautions for any such RT/RD signs/symptoms      Ophthalmic Meds Ordered this visit:  No orders of the defined types were placed in this encounter.    Return in about 1 year (around 11/15/2024) for f/u, BRAO, DFE, OCT.  There are no Patient Instructions on file for this visit.   Explained the diagnoses, plan, and follow up with the patient and they expressed understanding.  Patient expressed understanding of the importance of proper follow up care.   This document serves as a record of services personally performed by Redell JUDITHANN Hans, MD, PhD. It was created on their behalf by Avelina Pereyra, COA an ophthalmic technician. The creation of this record is the provider's dictation and/or activities during the visit.   Electronically signed by: Avelina GORMAN Pereyra, COT  11/22/23  3:57 PM   This document serves as a record of services personally performed by Redell JUDITHANN Hans, MD, PhD. It was created on their behalf by Wanda GEANNIE Keens, COT an ophthalmic technician. The creation of this record is the provider's dictation and/or activities during the visit.    Electronically signed by:  Wanda GEANNIE Keens, COT  11/22/23 3:57 PM  Redell JUDITHANN Hans, M.D., Ph.D. Diseases & Surgery of the Retina and Vitreous [facility]  I have reviewed the above documentation for accuracy and completeness, and I agree with the above. Redell JUDITHANN Hans, M.D., Ph.D. 11/22/23 3:57 PM    Abbreviations: M myopia (nearsighted); A astigmatism; H hyperopia (farsighted); P presbyopia; Mrx spectacle prescription;  CTL contact lenses; OD right eye; OS left eye; OU both eyes  XT exotropia; ET esotropia; PEK punctate epithelial  keratitis; PEE punctate epithelial erosions; DES dry eye syndrome; MGD meibomian gland dysfunction; ATs artificial tears; PFAT's preservative free artificial tears; NSC nuclear sclerotic cataract; PSC posterior subcapsular cataract; ERM epi-retinal membrane; PVD posterior vitreous detachment; RD retinal detachment; DM diabetes  mellitus; DR diabetic retinopathy; NPDR non-proliferative diabetic retinopathy; PDR proliferative diabetic retinopathy; CSME clinically significant macular edema; DME diabetic macular edema; dbh dot blot hemorrhages; CWS cotton wool spot; POAG primary open angle glaucoma; C/D cup-to-disc ratio; HVF humphrey visual field; GVF goldmann visual field; OCT optical coherence tomography; IOP intraocular pressure; BRVO Branch retinal vein occlusion; CRVO central retinal vein occlusion; CRAO central retinal artery occlusion; BRAO branch retinal artery occlusion; RT retinal tear; SB scleral buckle; PPV pars plana vitrectomy; VH Vitreous hemorrhage; PRP panretinal laser photocoagulation; IVK intravitreal kenalog; VMT vitreomacular traction; MH Macular hole;  NVD neovascularization of the disc; NVE neovascularization elsewhere; AREDS age related eye disease study; ARMD age related macular degeneration; POAG primary open angle glaucoma; EBMD epithelial/anterior basement membrane dystrophy; ACIOL anterior chamber intraocular lens; IOL intraocular lens; PCIOL posterior chamber intraocular lens; Phaco/IOL phacoemulsification with intraocular lens placement; PRK photorefractive keratectomy; LASIK laser assisted in situ keratomileusis; HTN hypertension; DM diabetes mellitus; COPD chronic obstructive pulmonary disease

## 2023-11-04 NOTE — Progress Notes (Unsigned)
   Electrophysiology Office Note:   Date:  11/05/2023  ID:  BRAXTON VANTREASE, DOB 1945/07/06, MRN [id number]  Primary Cardiologist: Lonni Cash, MD Electrophysiologist: None      History of Present Illness:   Jacob Rios is a 78 y.o. male with h/o CAD, Frequent PVCs, Chronic systolic CHF, HTN, HLD, and DM2 seen today for routine electrophysiology followup.   Started on amiodarone  for frequent PVCs and CHF.  Since last being seen in our clinic the patient reports doing well overall. Hasn't noted any new fatigue or SOB. Overall, he denies chest pain, palpitations, PND, orthopnea, nausea, vomiting, dizziness, syncope, edema, weight gain, or early satiety.    Review of systems complete and found to be negative unless listed in HPI.   Studies Reviewed:    EKG is not ordered today. EKG from 10/18/2023 reviewed which showed bigeminal PVCs and 1 couplet       Arrhythmia/Device History CARELINK ILR [employer]-CL   Physical Exam:   VS:  BP 115/61   Pulse (!) 52   Ht 5' 11.5 (1.816 m)   Wt 211 lb 1.6 oz (95.8 kg)   SpO2 93%   BMI 29.03 kg/m    Wt Readings from Last 3 Encounters:  11/05/23 211 lb 1.6 oz (95.8 kg)  10/18/23 216 lb 11.4 oz (98.3 kg)  09/07/23 205 lb (93 kg)     GEN: No acute distress NECK: No JVD; No carotid bruits CARDIAC: Regular rate and rhythm with occasional ectopy, no murmurs, rubs, gallops RESPIRATORY:  Clear to auscultation without rales, wheezing or rhonchi  ABDOMEN: Soft, non-tender, non-distended EXTREMITIES:  No edema; No deformity   ILR Interrogation- [facility] report reviewed.   ASSESSMENT AND PLAN:    Cryptogenic Stroke s/p Medtronic Loop recorder Normal device function No changes today  Frequent PVCs Chronic systolic CHF EF 30%, likely in setting of very frequent PVCs Continue amiodarone  200 mg daily Surveillance labs stable 10/28/2023 Loop recorder shows burden remains ~28%. Pt is hesitant to consider ablation and understands the  success rate is variable. With lack of symptoms, he wishes to continue medical management for now.   Follow up with Dr. Almetta in 3 months to transition from Dr. Cindie and discuss options.   Signed, Ozell Prentice Passey, PA-C

## 2023-11-05 ENCOUNTER — Ambulatory Visit: Attending: Student | Admitting: Student

## 2023-11-05 ENCOUNTER — Encounter: Payer: Self-pay | Admitting: Student

## 2023-11-05 VITALS — BP 115/61 | HR 52 | Ht 71.5 in | Wt 211.1 lb

## 2023-11-05 DIAGNOSIS — I493 Ventricular premature depolarization: Secondary | ICD-10-CM | POA: Diagnosis not present

## 2023-11-05 DIAGNOSIS — I25810 Atherosclerosis of coronary artery bypass graft(s) without angina pectoris: Secondary | ICD-10-CM

## 2023-11-05 DIAGNOSIS — I639 Cerebral infarction, unspecified: Secondary | ICD-10-CM | POA: Diagnosis not present

## 2023-11-05 MED ORDER — CLOPIDOGREL BISULFATE 75 MG PO TABS: 75.0000 mg | ORAL_TABLET | Freq: Every day | ORAL | 3 refills | Status: DC

## 2023-11-05 NOTE — Patient Instructions (Signed)
 Medication Instructions:   Your physician recommends that you continue on your current medications as directed. Please refer to the Current Medication list given to you today.  *If you need a refill on your cardiac medications before your next appointment, please call your pharmacy*  Lab Work: NONE ORDERED  TODAY    If you have labs (blood work) drawn today and your tests are completely normal, you will receive your results only by: MyChart Message (if you have MyChart) OR A paper copy in the mail If you have any lab test that is abnormal or we need to change your treatment, we will call you to review the results.  Testing/Procedures: NONE ORDERED  TODAY    Follow-Up: At [facility], you and your health needs are our priority.  As part of our continuing mission to provide you with exceptional heart care, our providers are all part of one team.  This team includes your primary Cardiologist (physician) and Advanced Practice Providers or APPs (Physician Assistants and Nurse Practitioners) who all work together to provide you with the care you need, when you need it.  Your next appointment:   3 month(s)    Provider:     Donnice Primus, MD    We recommend signing up for the patient portal called MyChart.  Sign up information is provided on this After Visit Summary.  MyChart is used to connect with patients for Virtual Visits (Telemedicine).  Patients are able to view lab/test results, encounter notes, upcoming appointments, etc.  Non-urgent messages can be sent to your provider as well.   To learn more about what you can do with MyChart, go to [email or web address].   Other Instructions

## 2023-11-09 MED ORDER — AMIODARONE HCL 200 MG PO TABS: 200.0000 mg | ORAL_TABLET | Freq: Every day | ORAL | 1 refills | Status: DC

## 2023-11-10 ENCOUNTER — Other Ambulatory Visit

## 2023-11-10 DIAGNOSIS — Z8546 Personal history of malignant neoplasm of prostate: Secondary | ICD-10-CM

## 2023-11-11 ENCOUNTER — Ambulatory Visit: Payer: Self-pay

## 2023-11-11 LAB — PSA: Prostate Specific Ag, Serum: 0.2 ng/mL (ref 0.0–4.0)

## 2023-11-12 ENCOUNTER — Ambulatory Visit (INDEPENDENT_AMBULATORY_CARE_PROVIDER_SITE_OTHER)

## 2023-11-12 ENCOUNTER — Ambulatory Visit: Admitting: Pulmonary Disease

## 2023-11-12 ENCOUNTER — Ambulatory Visit: Payer: Self-pay | Admitting: Cardiology

## 2023-11-12 DIAGNOSIS — I639 Cerebral infarction, unspecified: Secondary | ICD-10-CM | POA: Diagnosis not present

## 2023-11-12 LAB — CUP PACEART REMOTE DEVICE CHECK
Date Time Interrogation Session: 20251029231039
Implantable Pulse Generator Implant Date: 20220818

## 2023-11-16 ENCOUNTER — Ambulatory Visit (INDEPENDENT_AMBULATORY_CARE_PROVIDER_SITE_OTHER): Payer: Medicare PPO | Admitting: Ophthalmology

## 2023-11-16 ENCOUNTER — Encounter (INDEPENDENT_AMBULATORY_CARE_PROVIDER_SITE_OTHER): Payer: Self-pay | Admitting: Ophthalmology

## 2023-11-16 ENCOUNTER — Ambulatory Visit: Admitting: Urology

## 2023-11-16 VITALS — BP 133/68 | HR 52

## 2023-11-16 DIAGNOSIS — N403 Nodular prostate with lower urinary tract symptoms: Secondary | ICD-10-CM | POA: Diagnosis not present

## 2023-11-16 DIAGNOSIS — Z8546 Personal history of malignant neoplasm of prostate: Secondary | ICD-10-CM

## 2023-11-16 DIAGNOSIS — Z7984 Long term (current) use of oral hypoglycemic drugs: Secondary | ICD-10-CM | POA: Diagnosis not present

## 2023-11-16 DIAGNOSIS — E119 Type 2 diabetes mellitus without complications: Secondary | ICD-10-CM

## 2023-11-16 DIAGNOSIS — H34232 Retinal artery branch occlusion, left eye: Secondary | ICD-10-CM | POA: Diagnosis not present

## 2023-11-16 DIAGNOSIS — N281 Cyst of kidney, acquired: Secondary | ICD-10-CM | POA: Diagnosis not present

## 2023-11-16 DIAGNOSIS — I10 Essential (primary) hypertension: Secondary | ICD-10-CM

## 2023-11-16 DIAGNOSIS — H43812 Vitreous degeneration, left eye: Secondary | ICD-10-CM | POA: Diagnosis not present

## 2023-11-16 DIAGNOSIS — Z961 Presence of intraocular lens: Secondary | ICD-10-CM | POA: Diagnosis not present

## 2023-11-16 DIAGNOSIS — R350 Frequency of micturition: Secondary | ICD-10-CM

## 2023-11-16 DIAGNOSIS — H35033 Hypertensive retinopathy, bilateral: Secondary | ICD-10-CM | POA: Diagnosis not present

## 2023-11-16 DIAGNOSIS — R3129 Other microscopic hematuria: Secondary | ICD-10-CM

## 2023-11-16 LAB — MICROSCOPIC EXAMINATION: WBC, UA: >30 /HPF — AB (ref 0–5)

## 2023-11-16 LAB — URINALYSIS, ROUTINE W REFLEX MICROSCOPIC
Bilirubin, UA: NEGATIVE
Glucose, UA: NEGATIVE
Nitrite, UA: NEGATIVE
Specific Gravity, UA: 1.02 (ref 1.005–1.030)
Urobilinogen, Ur: 2 mg/dL — ABNORMAL HIGH (ref 0.2–1.0)
pH, UA: 6 (ref 5.0–7.5)

## 2023-11-16 NOTE — Progress Notes (Unsigned)
 11/16/2023 2:26 PM   Jacob Rios [birth year] [id number]  Referring provider: Gladis Mustard, FNP [address] [place],  [place] [address]  No chief complaint on file.   HPI:  New patient for me-   1) history of prostate cancer-he was treated with brachytherapy on 03/09/2020.  His 2024 PSA was down to 0.4 and stable at 0.4 in 2025.  2) urinary frequency-IPSS 9-14.  History of CVA.  On Plavix .  Nocturia x 3-4.  Today, seen for the above is October 2025 PSA 0.2.  Has a history of CKD 3 with his September 2025 renal ultrasound with bilateral renal cysts up to 3.5 cm and no hydronephrosis.  IPSS 7. New issue - microhematuria - No dysuria or gross hematuria. Remote smoker. No other exposure (apart from brachy).   PMH: Past Medical History:  Diagnosis Date   Cataract    CKD (chronic kidney disease), stage III (HCC)    followed by pcp   Coronary artery disease cardiologist--- dr verlin   08/ 1999  s/p  cath w/ PTCA and stenting to RCA;   05/ 2000 inferior wall MI , 06-06-1998 s/p cabg x3;   Last heart cath 2005,  2 patent grafts (diagnol and RCA) and occluded LIMA--LAD graft with normal LAD nonobstructive disease;  last nuclear study 11/ 2014 no evidence ishcemia, ef 40%   Full dentures    Hiatal hernia    History of acute inferior wall MI 05/1998   s/p  cabg   Hypertension    Hypertensive retinopathy    Mixed hyperlipidemia    Nocturia more than twice per night    PAD (peripheral artery disease)    left common iliac artery stenosis per aorta ultrasound 03/ 2021 in epic and cardiology note   Prostate cancer [facility]) urologist--- dr watt   first dx 07/ 2015 in active survillance until bx 11-11-2019,  Stage T2a, Gleason 3+4, PSA 13.3   S/P CABG x 3 06/06/1998   LIMA--LAD, SVG to Diagonal, RIMA to RCA   S/P primary angioplasty with coronary stent 08/1997   stent to RCA   Type 2 diabetes mellitus (HCC)    followed by pcp  (03-06-2020 checks blood sugar dialy in am,   fasting sugar-- 110-130)    Surgical History: Past Surgical History:  Procedure Laterality Date   APPENDECTOMY  child   CARDIAC CATHETERIZATION  09/01/2000  @[facility]    patent RCA and Diagonal grafts, atretic LIMA, moderate nonobstructive proxLAD, normal lvsf   CARDIAC CATHETERIZATION  04-21-2003  @[facility]    LIMA--LAD graft occluded, native LAD with nonobstructive disease,  patent diagonal/ rca grafts   CATARACT EXTRACTION     CATARACT EXTRACTION W/ INTRAOCULAR LENS IMPLANT Right 12/2018   CORONARY ANGIOPLASTY WITH STENT PLACEMENT  08/1997  @[facility]    ptca w/ stenting to rca   CORONARY ARTERY BYPASS GRAFT  06-06-1998  @[facility]     LIMA -- LAD, SVG -- Diagonal,  RIMA to RCA   CYSTOSCOPY N/A 03/09/2020   Procedure: CYSTOSCOPY FLEXIBLE;  Surgeon: Watt Rush, MD;  Location: [facility];  Service: Urology;  Laterality: N/A;  NO SEEDS FOUND IN BLADDER   EXCISION, MASS, UPPER EXTREMITY Right 06/04/2023   Procedure: EXCISION, MASS, UPPER EXTREMITY;  Surgeon: Kallie Manuelita BROCKS, MD;  Location: [facility];  Service: General;  Laterality: Right;  MINOR PROCEDURE ROOM   EYE SURGERY     INGUINAL HERNIA REPAIR Right 1974   LOOP RECORDER INSERTION N/A 08/30/2020   Procedure: LOOP RECORDER INSERTION;  Surgeon: Cindie Ole DASEN, MD;  Location: [facility] INVASIVE CV LAB;  Service: Cardiovascular;  Laterality: N/A;   PROSTATE BIOPSY  06/2017   x 4-5    RADIOACTIVE SEED IMPLANT N/A 03/09/2020   Procedure: RADIOACTIVE SEED IMPLANT/BRACHYTHERAPY IMPLANT;  Surgeon: Watt Rush, MD;  Location: [facility];  Service: Urology;  Laterality: N/A;   70  SEEDS IMPLANTED   SPACE OAR INSTILLATION N/A 03/09/2020   Procedure: SPACE OAR INSTILLATION;  Surgeon: Watt Rush, MD;  Location: [facility];  Service: Urology;  Laterality: N/A;   VENTRAL HERNIA REPAIR  09/ 2000 and recurrent repair 06/ 2001    Home Medications:  Allergies as of 11/16/2023       Reactions   Ms Contin [morphine] Shortness Of  Breath, Swelling   Zetia  [ezetimibe ] Other (See Comments)   Dizziness        Medication List        Accurate as of November 16, 2023  2:26 PM. If you have any questions, ask your nurse or doctor.          acetaminophen  500 MG tablet Commonly known as: TYLENOL  Take 1,000 mg by mouth every 4 (four) hours as needed for moderate pain (pain score 4-6) or headache.   amiodarone  200 MG tablet Commonly known as: Pacerone  Take 1 tablet (200 mg total) by mouth daily.   atorvastatin  80 MG tablet Commonly known as: LIPITOR Take 1 tablet (80 mg total) by mouth daily.   clopidogrel  75 MG tablet Commonly known as: PLAVIX  Take 1 tablet (75 mg total) by mouth daily.   glipiZIDE  10 MG 24 hr tablet Commonly known as: GLUCOTROL  XL Take 1 tablet (10 mg total) by mouth daily with breakfast.   glucose blood test strip Commonly known as: OneTouch Verio Test 1X per day and as needed  Dx 250.02   ibuprofen 200 MG tablet Commonly known as: ADVIL Take 400 mg by mouth 2 (two) times daily as needed for headache or moderate pain (pain score 4-6).   metFORMIN  1000 MG tablet Commonly known as: GLUCOPHAGE  Take 1 tablet (1,000 mg total) by mouth every evening.   metoprolol  succinate 50 MG 24 hr tablet Commonly known as: TOPROL -XL TAKE 1 TABLET BY MOUTH ONCE DAILY WITH OR IMMEDIATELY FOLLOWING A MEAL   omeprazole  40 MG capsule Commonly known as: PRILOSEC Take 1 capsule (40 mg total) by mouth daily after breakfast.   onetouch ultrasoft lancets Patient test 1X per day and prn  Dx 250.02   tobramycin 0.3 % ophthalmic solution Commonly known as: TOBREX Place 1 drop into both eyes at bedtime.   VITAMIN C PO Take 1 tablet by mouth daily.   VITAMIN D -3 PO Take 1 tablet by mouth daily.        Allergies:  Allergies  Allergen Reactions   Ms Contin [Morphine] Shortness Of Breath and Swelling   Zetia  [Ezetimibe ] Other (See Comments)    Dizziness    Family History: Family History   Problem Relation Age of Onset   Lung cancer Mother    Dementia Father    Hyperlipidemia Father    Congestive Heart Failure Father    Post-traumatic stress disorder Son    Alcohol  abuse Maternal Grandfather    Cancer Maternal Grandfather 31       stomach cancer    Heart attack Neg Hx    Stroke Neg Hx    Breast cancer Neg Hx    Colon cancer Neg Hx    Prostate  cancer Neg Hx    Pancreatic cancer Neg Hx     Social History:  reports that he quit smoking about 26 years ago. His smoking use included cigarettes. He started smoking about 71 years ago. He has a 180 pack-year smoking history. He has never used smokeless tobacco. He reports that he does not drink alcohol  and does not use drugs.   Physical Exam: BP 133/68   Pulse (!) 52   Constitutional:  Alert and oriented, No acute distress. HEENT: [place] AT, moist mucus membranes.  Trachea midline, no masses. Cardiovascular: No clubbing, cyanosis, or edema. Respiratory: Normal respiratory effort, no increased work of breathing. GI: Abdomen is soft, nontender, nondistended, no abdominal masses GU: No CVA tenderness Skin: No rashes, bruises or suspicious lesions. Neurologic: Grossly intact, no focal deficits, moving all 4 extremities. Psychiatric: Normal mood and affect.  Laboratory Data: Lab Results  Component Value Date   WBC 5.9 10/18/2023   HGB 10.9 (L) 10/18/2023   HCT 34.1 (L) 10/18/2023   MCV 85.0 10/18/2023   PLT 199 10/18/2023    Lab Results  Component Value Date   CREATININE 1.94 (H) 10/28/2023    Lab Results  Component Value Date   PSA 10.4 (H) 03/30/2019   PSA 5.0 (H) 11/18/2013   PSA 5.3 (H) 05/20/2013    No results found for: TESTOSTERONE  Lab Results  Component Value Date   HGBA1C 5.1 07/15/2023    Urinalysis    Component Value Date/Time   COLORURINE AMBER (A) 07/15/2018 1202   APPEARANCEUR Clear 02/12/2023 1016   LABSPEC 1.026 07/15/2018 1202   PHURINE 5.0 07/15/2018 1202   GLUCOSEU Negative  02/12/2023 1016   HGBUR NEGATIVE 07/15/2018 1202   BILIRUBINUR Negative 02/12/2023 1016   KETONESUR NEGATIVE 07/15/2018 1202   PROTEINUR 1+ (A) 02/12/2023 1016   PROTEINUR 100 (A) 07/15/2018 1202   UROBILINOGEN 0.2 04/08/2019 1118   NITRITE Negative 02/12/2023 1016   NITRITE NEGATIVE 07/15/2018 1202   LEUKOCYTESUR Negative 02/12/2023 1016   LEUKOCYTESUR LARGE (A) 07/15/2018 1202    Lab Results  Component Value Date   LABMICR 34.7 07/21/2023   WBCUA 0-5 02/12/2023   LABEPIT 0-10 02/12/2023   MUCUS Present 07/05/2020   BACTERIA None seen 02/12/2023    Pertinent Imaging:  Results for orders placed during the hospital encounter of 10/05/23  US  RENAL  Narrative CLINICAL DATA:  Initial evaluation for chronic kidney disease, stage III B  EXAM: RENAL / URINARY TRACT ULTRASOUND COMPLETE  COMPARISON:  CT from 12/19/2019.  FINDINGS: Right Kidney:  Renal measurements: 10.6 x 4.5 x 5.2 cm = volume: 127.6 mL. Renal echogenicity within normal limits. No nephrolithiasis or hydronephrosis. 2.2 x 2.0 x 2.0 cm simple cyst present at the lower pole. Additional 1.2 cm simple cyst also present at the lower pole. 3.5 x 2.4 x 3.0 cm simple cyst present at the interpolar region.  Left Kidney:  Renal measurements: 10.0 x 5.1 x 5.4 cm = volume: 144.0 mL. Renal echogenicity within normal limits. No nephrolithiasis or hydronephrosis. 2.2 x 2.0 x 2.3 cm simple cyst present at the interpolar region. 2.0 x 1.5 x 1.6 cm simple cyst present at the lower pole.  Bladder:  Decompressed and not well evaluated.  Other:  None.  IMPRESSION: 1. Multiple simple bilateral renal cysts measuring up to 3.5 cm as above. These are benign in appearance, with no follow-up imaging recommended regarding these lesions. 2. Otherwise unremarkable renal ultrasound. Renal echogenicity within normal limits. No hydronephrosis.   Electronically  Signed By: Morene Hoard M.D. On: 10/07/2023  05:47    Assessment & Plan:    1.  History of prostate cancer-PSA remains very low.  Looks good. - Urinalysis, Routine w reflex microscopic; Future - Urinalysis, Routine w reflex microscopic  2.  Urinary frequency stable.  3.  Microscopic hematuria, renal cyst-recent ultrasound benign.  I will have him follow-up for cystoscopy.  No follow-ups on file.  Donnice Brooks, MD  [facility]  [address] [place], [place] [address] [phone]

## 2023-11-17 NOTE — Progress Notes (Signed)
 Remote Loop Recorder Transmission

## 2023-11-22 ENCOUNTER — Encounter (INDEPENDENT_AMBULATORY_CARE_PROVIDER_SITE_OTHER): Payer: Self-pay | Admitting: Ophthalmology

## 2023-12-02 MED ORDER — AMIODARONE HCL 100 MG PO TABS: 100.0000 mg | ORAL_TABLET | Freq: Every day | ORAL | 3 refills | Status: AC

## 2023-12-02 NOTE — Addendum Note (Signed)
 Addended by: CHAUVIGNE, Danil Wedge on: 12/02/2023 05:22 PM   Modules accepted: Orders

## 2023-12-08 DIAGNOSIS — N1832 Chronic kidney disease, stage 3b: Secondary | ICD-10-CM | POA: Diagnosis not present

## 2023-12-13 ENCOUNTER — Ambulatory Visit

## 2023-12-14 ENCOUNTER — Ambulatory Visit: Attending: Student in an Organized Health Care Education/Training Program

## 2023-12-14 ENCOUNTER — Encounter

## 2023-12-14 DIAGNOSIS — I639 Cerebral infarction, unspecified: Secondary | ICD-10-CM

## 2023-12-14 LAB — CUP PACEART REMOTE DEVICE CHECK
Date Time Interrogation Session: 20251130231243
Implantable Pulse Generator Implant Date: 20220818

## 2023-12-16 DIAGNOSIS — D631 Anemia in chronic kidney disease: Secondary | ICD-10-CM | POA: Diagnosis not present

## 2023-12-16 DIAGNOSIS — I129 Hypertensive chronic kidney disease with stage 1 through stage 4 chronic kidney disease, or unspecified chronic kidney disease: Secondary | ICD-10-CM | POA: Diagnosis not present

## 2023-12-16 DIAGNOSIS — N1832 Chronic kidney disease, stage 3b: Secondary | ICD-10-CM | POA: Diagnosis not present

## 2023-12-16 DIAGNOSIS — N2581 Secondary hyperparathyroidism of renal origin: Secondary | ICD-10-CM | POA: Diagnosis not present

## 2023-12-18 NOTE — Progress Notes (Signed)
 Remote Loop Recorder Transmission

## 2023-12-25 ENCOUNTER — Ambulatory Visit: Payer: Medicare PPO

## 2024-01-06 ENCOUNTER — Ambulatory Visit: Payer: Self-pay | Admitting: Student in an Organized Health Care Education/Training Program

## 2024-01-13 ENCOUNTER — Ambulatory Visit

## 2024-01-14 ENCOUNTER — Ambulatory Visit

## 2024-01-14 ENCOUNTER — Encounter

## 2024-01-14 DIAGNOSIS — I639 Cerebral infarction, unspecified: Secondary | ICD-10-CM | POA: Diagnosis not present

## 2024-01-15 LAB — CUP PACEART REMOTE DEVICE CHECK
Date Time Interrogation Session: 20251231230720
Implantable Pulse Generator Implant Date: 20220818

## 2024-01-17 ENCOUNTER — Ambulatory Visit: Payer: Self-pay | Admitting: Student in an Organized Health Care Education/Training Program

## 2024-01-18 ENCOUNTER — Encounter: Payer: Self-pay | Admitting: Nurse Practitioner

## 2024-01-18 ENCOUNTER — Ambulatory Visit: Payer: Self-pay | Admitting: Nurse Practitioner

## 2024-01-18 VITALS — BP 128/72 | HR 76 | Temp 97.8°F | Ht 71.0 in | Wt 212.0 lb

## 2024-01-18 DIAGNOSIS — I509 Heart failure, unspecified: Secondary | ICD-10-CM

## 2024-01-18 DIAGNOSIS — K219 Gastro-esophageal reflux disease without esophagitis: Secondary | ICD-10-CM

## 2024-01-18 DIAGNOSIS — E785 Hyperlipidemia, unspecified: Secondary | ICD-10-CM

## 2024-01-18 DIAGNOSIS — Z6835 Body mass index (BMI) 35.0-35.9, adult: Secondary | ICD-10-CM

## 2024-01-18 DIAGNOSIS — I25810 Atherosclerosis of coronary artery bypass graft(s) without angina pectoris: Secondary | ICD-10-CM

## 2024-01-18 DIAGNOSIS — I6930 Unspecified sequelae of cerebral infarction: Secondary | ICD-10-CM

## 2024-01-18 DIAGNOSIS — Z7984 Long term (current) use of oral hypoglycemic drugs: Secondary | ICD-10-CM

## 2024-01-18 DIAGNOSIS — E1169 Type 2 diabetes mellitus with other specified complication: Secondary | ICD-10-CM

## 2024-01-18 DIAGNOSIS — E119 Type 2 diabetes mellitus without complications: Secondary | ICD-10-CM

## 2024-01-18 DIAGNOSIS — N1832 Chronic kidney disease, stage 3b: Secondary | ICD-10-CM

## 2024-01-18 DIAGNOSIS — I10 Essential (primary) hypertension: Secondary | ICD-10-CM

## 2024-01-18 LAB — BAYER DCA HB A1C WAIVED: HB A1C (BAYER DCA - WAIVED): 5.6 % (ref 4.8–5.6)

## 2024-01-18 MED ORDER — METOPROLOL SUCCINATE ER 50 MG PO TB24: ORAL_TABLET | ORAL | 1 refills | Status: AC

## 2024-01-18 MED ORDER — OMEPRAZOLE 40 MG PO CPDR: 40.0000 mg | DELAYED_RELEASE_CAPSULE | Freq: Every day | ORAL | 1 refills | Status: AC

## 2024-01-18 MED ORDER — CELECOXIB 200 MG PO CAPS: 200.0000 mg | ORAL_CAPSULE | Freq: Two times a day (BID) | ORAL | 1 refills | Status: AC

## 2024-01-18 MED ORDER — CLOPIDOGREL BISULFATE 75 MG PO TABS: 75.0000 mg | ORAL_TABLET | Freq: Every day | ORAL | 3 refills | Status: AC

## 2024-01-18 NOTE — Patient Instructions (Signed)
 Fall Prevention in the Home, Adult Falls can cause injuries and can happen to people of all ages. There are many things you can do to make your home safer and to help prevent falls. What actions can I take to prevent falls? General information Use good lighting in all rooms. Make sure to: Replace any light bulbs that burn out. Turn on the lights in dark areas and use night-lights. Keep items that you use often in easy-to-reach places. Lower the shelves around your home if needed. Move furniture so that there are clear paths around it. Do not use throw rugs or other things on the floor that can make you trip. If any of your floors are uneven, fix them. Add color or contrast paint or tape to clearly mark and help you see: Grab bars or handrails. First and last steps of staircases. Where the edge of each step is. If you use a ladder or stepladder: Make sure that it is fully opened. Do not climb a closed ladder. Make sure the sides of the ladder are locked in place. Have someone hold the ladder while you use it. Know where your pets are as you move through your home. What can I do in the bathroom?     Keep the floor dry. Clean up any water on the floor right away. Remove soap buildup in the bathtub or shower. Buildup makes bathtubs and showers slippery. Use non-skid mats or decals on the floor of the bathtub or shower. Attach bath mats securely with double-sided, non-slip rug tape. If you need to sit down in the shower, use a non-slip stool. Install grab bars by the toilet and in the bathtub and shower. Do not use towel bars as grab bars. What can I do in the bedroom? Make sure that you have a light by your bed that is easy to reach. Do not use any sheets or blankets on your bed that hang to the floor. Have a firm chair or bench with side arms that you can use for support when you get dressed. What can I do in the kitchen? Clean up any spills right away. If you need to reach something  above you, use a step stool with a grab bar. Keep electrical cords out of the way. Do not use floor polish or wax that makes floors slippery. What can I do with my stairs? Do not leave anything on the stairs. Make sure that you have a light switch at the top and the bottom of the stairs. Make sure that there are handrails on both sides of the stairs. Fix handrails that are broken or loose. Install non-slip stair treads on all your stairs if they do not have carpet. Avoid having throw rugs at the top or bottom of the stairs. Choose a carpet that does not hide the edge of the steps on the stairs. Make sure that the carpet is firmly attached to the stairs. Fix carpet that is loose or worn. What can I do on the outside of my home? Use bright outdoor lighting. Fix the edges of walkways and driveways and fix any cracks. Clear paths of anything that can make you trip, such as tools or rocks. Add color or contrast paint or tape to clearly mark and help you see anything that might make you trip as you walk through a door, such as a raised step or threshold. Trim any bushes or trees on paths to your home. Check to see if handrails are loose  or broken and that both sides of all steps have handrails. Install guardrails along the edges of any raised decks and porches. Have leaves, snow, or ice cleared regularly. Use sand, salt, or ice melter on paths if you live where there is ice and snow during the winter. Clean up any spills in your garage right away. This includes grease or oil spills. What other actions can I take? Review your medicines with your doctor. Some medicines can cause dizziness or changes in blood pressure, which increase your risk of falling. Wear shoes that: Have a low heel. Do not wear high heels. Have rubber bottoms and are closed at the toe. Feel good on your feet and fit well. Use tools that help you move around if needed. These include: Canes. Walkers. Scooters. Crutches. Ask  your doctor what else you can do to help prevent falls. This may include seeing a physical therapist to learn to do exercises to move better and get stronger. Where to find more information Centers for Disease Control and Prevention, [employer]: TonerPromos.no [employer] on Aging: BaseRingTones.pl National Institute on Aging: BaseRingTones.pl Contact a doctor if: You are afraid of falling at home. You feel weak, drowsy, or dizzy at home. You fall at home. Get help right away if you: Lose consciousness or have trouble moving after a fall. Have a fall that causes a head injury. These symptoms may be an emergency. Get help right away. Call 911. Do not wait to see if the symptoms will go away. Do not drive yourself to the hospital. This information is not intended to replace advice given to you by your health care provider. Make sure you discuss any questions you have with your health care provider. Document Revised: 09/02/2021 Document Reviewed: 09/02/2021 Elsevier Patient Education  2024 [employer].

## 2024-01-18 NOTE — Progress Notes (Signed)
 "  Subjective:    Patient ID: Jacob Rios, male    DOB: 12/10/1945, 79 y.o.   MRN: [id number]  Chief Complaint: medical management of chronic issues     HPI:  [name] is a 79 y.o. who identifies as a male who was assigned male at birth.   Social history: Lives with: by himself Work history: retired   Water Engineer in today for follow up of the following chronic medical issues:  1. Essential hypertension No c/o chest pain, sob or headache. Doe snot check blood pressure at home. BP Readings from Last 3 Encounters:  11/16/23 133/68  11/05/23 115/61  10/18/23 115/67     2. Mixed hyperlipidemia Does not really watch diet very closely Lab Results  Component Value Date   CHOL 147 07/21/2023   HDL 31 (L) 07/21/2023   LDLCALC 94 07/21/2023   TRIG 120 07/21/2023   CHOLHDL 4.7 07/21/2023     3. Gastroesophageal reflux disease without esophagitis Is on omeprazole  daily and is doing well.  4. Coronary atherosclerosis of autologous vein bypass graft without angina 5. CHF Last saw cardiology on 11/05/23. Has been put on pacerone .  6. Type 2 diabetes mellitus treated with oral medication Fasting blood sugars are running around 110-140. Cardiology stopped his metformin  and tried him on jardiance . He on ly took jardiance  for a few days and started affecting his kidneys. He is now only on glipizide . Blood sugars have been running in 70's at times.  Lab Results  Component Value Date   HGBA1C 5.1 07/15/2023     7. CKD stage 3 due to type 2 diabetes mellitus (HCC) No voiding issues Lab Results  Component Value Date   CREATININE 1.94 (H) 10/28/2023     8. history of cerebrovascular accident (CVA) No permanent effects  9. BMI 35.0-35.9,adult No recent weight changes Wt Readings from Last 3 Encounters:  01/18/24 212 lb (96.2 kg)  11/05/23 211 lb 1.6 oz (95.8 kg)  10/18/23 216 lb 11.4 oz (98.3 kg)   BMI Readings from Last 3 Encounters:  01/18/24 29.57 kg/m  11/05/23  29.03 kg/m  10/18/23 29.80 kg/m       New complaints: None today  Allergies  Allergen Reactions   Ms Contin [Morphine] Shortness Of Breath and Swelling   Zetia  [Ezetimibe ] Other (See Comments)    Dizziness   Outpatient Encounter Medications as of 01/18/2024  Medication Sig   acetaminophen  (TYLENOL ) 500 MG tablet Take 1,000 mg by mouth every 4 (four) hours as needed for moderate pain (pain score 4-6) or headache.   amiodarone  (PACERONE ) 100 MG tablet Take 1 tablet (100 mg total) by mouth daily.   Ascorbic Acid (VITAMIN C PO) Take 1 tablet by mouth daily.   atorvastatin  (LIPITOR) 80 MG tablet Take 1 tablet (80 mg total) by mouth daily.   Cholecalciferol (VITAMIN D -3 PO) Take 1 tablet by mouth daily.   clopidogrel  (PLAVIX ) 75 MG tablet Take 1 tablet (75 mg total) by mouth daily.   glipiZIDE  (GLUCOTROL  XL) 10 MG 24 hr tablet Take 1 tablet (10 mg total) by mouth daily with breakfast.   glucose blood (ONETOUCH VERIO) test strip Test 1X per day and as needed  Dx 250.02   ibuprofen (ADVIL) 200 MG tablet Take 400 mg by mouth 2 (two) times daily as needed for headache or moderate pain (pain score 4-6).   Lancets (ONETOUCH ULTRASOFT) lancets Patient test 1X per day and prn  Dx 250.02   metFORMIN  (GLUCOPHAGE ) 1000 MG  tablet Take 1 tablet (1,000 mg total) by mouth every evening. (Patient not taking: Reported on 11/16/2023)   metoprolol  succinate (TOPROL -XL) 50 MG 24 hr tablet TAKE 1 TABLET BY MOUTH ONCE DAILY WITH OR IMMEDIATELY FOLLOWING A MEAL   omeprazole  (PRILOSEC) 40 MG capsule Take 1 capsule (40 mg total) by mouth daily after breakfast.   tobramycin (TOBREX) 0.3 % ophthalmic solution Place 1 drop into both eyes at bedtime. (Patient not taking: Reported on 11/16/2023)   No facility-administered encounter medications on file as of 01/18/2024.    Past Surgical History:  Procedure Laterality Date   APPENDECTOMY  child   CARDIAC CATHETERIZATION  09/01/2000  @[facility]    patent RCA and Diagonal  grafts, atretic LIMA, moderate nonobstructive proxLAD, normal lvsf   CARDIAC CATHETERIZATION  04-21-2003  @[facility]    LIMA--LAD graft occluded, native LAD with nonobstructive disease,  patent diagonal/ rca grafts   CATARACT EXTRACTION     CATARACT EXTRACTION W/ INTRAOCULAR LENS IMPLANT Right 12/2018   CORONARY ANGIOPLASTY WITH STENT PLACEMENT  08/1997  @[facility]    ptca w/ stenting to rca   CORONARY ARTERY BYPASS GRAFT  06-06-1998  @[facility]     LIMA -- LAD, SVG -- Diagonal,  RIMA to RCA   CYSTOSCOPY N/A 03/09/2020   Procedure: CYSTOSCOPY FLEXIBLE;  Surgeon: Watt Rush, MD;  Location: [facility];  Service: Urology;  Laterality: N/A;  NO SEEDS FOUND IN BLADDER   EXCISION, MASS, UPPER EXTREMITY Right 06/04/2023   Procedure: EXCISION, MASS, UPPER EXTREMITY;  Surgeon: Kallie Manuelita BROCKS, MD;  Location: [facility];  Service: General;  Laterality: Right;  MINOR PROCEDURE ROOM   EYE SURGERY     INGUINAL HERNIA REPAIR Right 1974   LOOP RECORDER INSERTION N/A 08/30/2020   Procedure: LOOP RECORDER INSERTION;  Surgeon: Cindie Ole DASEN, MD;  Location: [facility];  Service: Cardiovascular;  Laterality: N/A;   PROSTATE BIOPSY  06/2017   x 4-5    RADIOACTIVE SEED IMPLANT N/A 03/09/2020   Procedure: RADIOACTIVE SEED IMPLANT/BRACHYTHERAPY IMPLANT;  Surgeon: Watt Rush, MD;  Location: [facility];  Service: Urology;  Laterality: N/A;   70  SEEDS IMPLANTED   SPACE OAR INSTILLATION N/A 03/09/2020   Procedure: SPACE OAR INSTILLATION;  Surgeon: Watt Rush, MD;  Location: [facility];  Service: Urology;  Laterality: N/A;   VENTRAL HERNIA REPAIR  09/ 2000 and recurrent repair 06/ 2001    Family History  Problem Relation Age of Onset   Lung cancer Mother    Dementia Father    Hyperlipidemia Father    Congestive Heart Failure Father    Post-traumatic stress disorder Son    Alcohol  abuse Maternal Grandfather    Cancer Maternal Grandfather 12       stomach cancer    Heart  attack Neg Hx    Stroke Neg Hx    Breast cancer Neg Hx    Colon cancer Neg Hx    Prostate cancer Neg Hx    Pancreatic cancer Neg Hx       Controlled substance contract: n/a      Review of Systems  Constitutional:  Negative for diaphoresis.  Eyes:  Negative for pain.  Respiratory:  Negative for shortness of breath.   Cardiovascular:  Negative for chest pain, palpitations and leg swelling.  Gastrointestinal:  Negative for abdominal pain.  Endocrine: Negative for polydipsia.  Skin:  Negative for rash.  Neurological:  Negative for dizziness, weakness and headaches.  Hematological:  Does not bruise/bleed easily.  All other systems reviewed and are negative.      Objective:   Physical Exam Vitals and nursing note reviewed.  Constitutional:      Appearance: Normal appearance. He is well-developed.  HENT:     Head: Normocephalic.     Nose: Nose normal.     Mouth/Throat:     Mouth: Mucous membranes are moist.     Pharynx: Oropharynx is clear.  Eyes:     Pupils: Pupils are equal, round, and reactive to light.  Neck:     Thyroid : No thyroid  mass or thyromegaly.     Vascular: No carotid bruit or JVD.     Trachea: Phonation normal.  Cardiovascular:     Rate and Rhythm: Normal rate and regular rhythm.  Pulmonary:     Effort: Pulmonary effort is normal. No respiratory distress.     Breath sounds: Normal breath sounds.  Abdominal:     General: Bowel sounds are normal.     Palpations: Abdomen is soft.     Tenderness: There is no abdominal tenderness.  Musculoskeletal:        General: Normal range of motion.     Cervical back: Normal range of motion and neck supple.  Lymphadenopathy:     Cervical: No cervical adenopathy.  Skin:    General: Skin is warm and dry.  Neurological:     Mental Status: He is alert and oriented to person, place, and time.  Psychiatric:        Behavior: Behavior normal.        Thought Content: Thought content normal.        Judgment: Judgment  normal.    BP 128/72   Pulse 76   Temp 97.8 F (36.6 C) (Temporal)   Ht 5' 11 (1.803 m)   Wt 212 lb (96.2 kg)   SpO2 98%   BMI 29.57 kg/m      HGBA1c 5.6%       Assessment & Plan:  VINCENZO STAVE comes in today with chief complaint of medical management of chronic issues    Diagnosis and orders addressed:  1. Essential hypertension (Primary) Low sodium diet - CBC with Differential/Platelet - CMP14+EGFR - lisinopril  (ZESTRIL ) 2.5 MG tablet; Take 1 tablet (2.5 mg total) by mouth daily.  Dispense: 90 tablet; Refill: 1 - metoprolol  succinate (TOPROL -XL) 50 MG 24 hr tablet; TAKE 1 TABLET BY MOUTH ONCE DAILY WITH OR IMMEDIATELY FOLLOWING A MEAL  Dispense: 90 tablet; Refill: 1  2. Hyperlipidemia associated with type 2 diabetes mellitus (HCC) Low fat diet - Lipid panel - atorvastatin  (LIPITOR) 40 MG tablet; Take 1 tablet (40 mg total) by mouth daily.  Dispense: 90 tablet; Refill: 1  3. 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  Dispense: 90 tablet; Refill: 1  4. Diabetes mellitus treated with oral medication (HCC) Continue to watch carbs in diet 1/2 glipizide  daily - Bayer DCA Hb A1c Waived - glipiZIDE  (GLUCOTROL  XL) 10 MG 24 hr tablet; Take 1 tablet (10 mg total) by mouth daily.  Dispense: 90 tablet; Refill: 1   5. Gastroesophageal reflux disease without esophagitis Avoid spicy foods Do not eat 2 hours prior to bedtime  - omeprazole  (PRILOSEC) 40 MG capsule; Take 1 capsule (40 mg total) by mouth daily.  Dispense: 90 capsule; Refill: 1  6. CKD stage 3 due to type 2 diabetes mellitus (HCC)  Labs pending  7. History of CVA in adulthood  8. BMI 35.0-35.9,adult Discussed diet and exercise for person with BMI >25 Will recheck weight in 3-6 months   Labs pending Health Maintenance reviewed Diet and exercise  encouraged  Follow up plan: 6 months   [name] Gladis, FNP  "

## 2024-01-19 ENCOUNTER — Ambulatory Visit: Payer: Self-pay | Admitting: Nurse Practitioner

## 2024-01-19 DIAGNOSIS — R748 Abnormal levels of other serum enzymes: Secondary | ICD-10-CM

## 2024-01-19 LAB — CBC WITH DIFFERENTIAL/PLATELET
Basophils Absolute: 0 x10E3/uL (ref 0.0–0.2)
Basos: 0 %
EOS (ABSOLUTE): 0.3 x10E3/uL (ref 0.0–0.4)
Eos: 4 %
Hematocrit: 38.2 % (ref 37.5–51.0)
Hemoglobin: 12.4 g/dL — ABNORMAL LOW (ref 13.0–17.7)
Immature Grans (Abs): 0 x10E3/uL (ref 0.0–0.1)
Immature Granulocytes: 0 %
Lymphocytes Absolute: 1 x10E3/uL (ref 0.7–3.1)
Lymphs: 15 %
MCH: 27.7 pg (ref 26.6–33.0)
MCHC: 32.5 g/dL (ref 31.5–35.7)
MCV: 85 fL (ref 79–97)
Monocytes Absolute: 0.5 x10E3/uL (ref 0.1–0.9)
Monocytes: 8 %
Neutrophils Absolute: 4.9 x10E3/uL (ref 1.4–7.0)
Neutrophils: 73 %
Platelets: 233 x10E3/uL (ref 150–450)
RBC: 4.48 x10E6/uL (ref 4.14–5.80)
RDW: 15.1 % (ref 11.6–15.4)
WBC: 6.7 x10E3/uL (ref 3.4–10.8)

## 2024-01-19 LAB — CMP14+EGFR
ALT: 15 IU/L (ref 0–44)
AST: 17 IU/L (ref 0–40)
Albumin: 4 g/dL (ref 3.8–4.8)
Alkaline Phosphatase: 106 IU/L (ref 47–123)
BUN/Creatinine Ratio: 13 (ref 10–24)
BUN: 27 mg/dL (ref 8–27)
Bilirubin Total: 0.5 mg/dL (ref 0.0–1.2)
CO2: 26 mmol/L (ref 20–29)
Calcium: 9.2 mg/dL (ref 8.6–10.2)
Chloride: 102 mmol/L (ref 96–106)
Creatinine, Ser: 2.12 mg/dL — ABNORMAL HIGH (ref 0.76–1.27)
Globulin, Total: 2.8 g/dL (ref 1.5–4.5)
Glucose: 94 mg/dL (ref 70–99)
Potassium: 4.8 mmol/L (ref 3.5–5.2)
Sodium: 141 mmol/L (ref 134–144)
Total Protein: 6.8 g/dL (ref 6.0–8.5)
eGFR: 31 mL/min/1.73 — ABNORMAL LOW

## 2024-01-19 LAB — LIPID PANEL
Chol/HDL Ratio: 3.3 ratio (ref 0.0–5.0)
Cholesterol, Total: 105 mg/dL (ref 100–199)
HDL: 32 mg/dL — ABNORMAL LOW
LDL Chol Calc (NIH): 57 mg/dL (ref 0–99)
Triglycerides: 78 mg/dL (ref 0–149)
VLDL Cholesterol Cal: 16 mg/dL (ref 5–40)

## 2024-01-19 LAB — VITAMIN B12: Vitamin B-12: 1404 pg/mL — ABNORMAL HIGH (ref 232–1245)

## 2024-01-19 NOTE — Progress Notes (Signed)
 Remote Loop Recorder Transmission

## 2024-02-01 ENCOUNTER — Other Ambulatory Visit: Admitting: Urology

## 2024-02-02 ENCOUNTER — Ambulatory Visit

## 2024-02-02 VITALS — BP 128/72 | HR 76 | Ht 71.0 in | Wt 212.0 lb

## 2024-02-02 DIAGNOSIS — Z0000 Encounter for general adult medical examination without abnormal findings: Secondary | ICD-10-CM

## 2024-02-02 NOTE — Patient Instructions (Signed)
 Jacob Rios,  Thank you for taking the time for your Medicare Wellness Visit. I appreciate your continued commitment to your health goals. Please review the care plan we discussed, and feel free to reach out if I can assist you further.  Please note that Annual Wellness Visits do not include a physical exam. Some assessments may be limited, especially if the visit was conducted virtually. If needed, we may recommend an in-person follow-up with your provider.  Ongoing Care Seeing your primary care provider every 3 to 6 months helps us  monitor your health and provide consistent, personalized care.   Referrals If a referral was made during today's visit and you haven't received any updates within two weeks, please contact the referred provider directly to check on the status.  Recommended Screenings:  Health Maintenance  Topic Date Due   Eye exam for diabetics  05/31/2022   Complete foot exam   08/03/2022   Medicare Annual Wellness Visit  12/24/2023   Kidney health urinalysis for diabetes  01/21/2024   COVID-19 Vaccine (5 - 2025-26 season) 02/03/2024*   Hemoglobin A1C  07/17/2024   Yearly kidney function blood test for diabetes  01/17/2025   DTaP/Tdap/Td vaccine (3 - Td or Tdap) 05/06/2033   Pneumococcal Vaccine for age over 39  Completed   Flu Shot  Completed   Hepatitis C Screening  Completed   Zoster (Shingles) Vaccine  Completed   Meningitis B Vaccine  Aged Out  *Topic was postponed. The date shown is not the original due date.       02/02/2024   11:11 AM  Advanced Directives  Does Patient Have a Medical Advance Directive? No  Would patient like information on creating a medical advance directive? No - Patient declined    Vision: Annual vision screenings are recommended for early detection of glaucoma, cataracts, and diabetic retinopathy. These exams can also reveal signs of chronic conditions such as diabetes and high blood pressure.  Dental: Annual dental screenings help  detect early signs of oral cancer, gum disease, and other conditions linked to overall health, including heart disease and diabetes.  Please see the attached documents for additional preventive care recommendations.

## 2024-02-02 NOTE — Progress Notes (Signed)
 "  Chief Complaint  Patient presents with   Medicare Wellness     Subjective:   Jacob Rios is a 79 y.o. male who presents for a Medicare Annual Wellness Visit.  Visit info / Clinical Intake: Medicare Wellness Visit Type:: Subsequent Annual Wellness Visit Persons participating in visit and providing information:: patient Medicare Wellness Visit Mode:: Telephone If telephone:: video declined Since this visit was completed virtually, some vitals may be partially provided or unavailable. Missing vitals are due to the limitations of the virtual format.: Unable to obtain vitals - no equipment If Telephone or Video please confirm:: I connected with patient using audio/video enable telemedicine. I verified patient identity with two identifiers, discussed telehealth limitations, and patient agreed to proceed. Patient Location:: home Provider Location:: office Interpreter Needed?: No Pre-visit prep was completed: yes AWV questionnaire completed by patient prior to visit?: no Living arrangements:: lives with spouse/significant other Patient's Overall Health Status Rating: very good Typical amount of pain: none Does pain affect daily life?: no Are you currently prescribed opioids?: no  Dietary Habits and Nutritional Risks How many meals a day?: 3 Eats fruit and vegetables daily?: yes Most meals are obtained by: preparing own meals In the last 2 weeks, have you had any of the following?: none Diabetic:: (!) yes Any non-healing wounds?: (!) yes How often do you check your BS?: 4 Would you like to be referred to a Nutritionist or for Diabetic Management? : no  Functional Status Activities of Daily Living (to include ambulation/medication): Independent Ambulation: Independent Medication Administration: Needs assistance (comment) (wife helps) Home Management (perform basic housework or laundry): Independent Manage your own finances?: yes Primary transportation is: driving Concerns  about vision?: no *vision screening is required for WTM* (12/25) Concerns about hearing?: (!) yes (has a hard time hearing quesitons) Uses hearing aids?: no  Fall Screening Falls in the past year?: 0 Number of falls in past year: 0 Was there an injury with Fall?: 0 Fall Risk Category Calculator: 0 Patient Fall Risk Level: Low Fall Risk  Fall Risk Patient at Risk for Falls Due to: No Fall Risks Fall risk Follow up: Falls evaluation completed; Education provided  Home and Transportation Safety: All rugs have non-skid backing?: yes All stairs or steps have railings?: yes Grab bars in the bathtub or shower?: yes Have non-skid surface in bathtub or shower?: yes Good home lighting?: yes Regular seat belt use?: yes Hospital stays in the last year:: no  Cognitive Assessment Difficulty concentrating, remembering, or making decisions? : no Will 6CIT or Mini Cog be Completed: yes What year is it?: 0 points What month is it?: 0 points Give patient an address phrase to remember (5 components): [address] [place] About what time is it?: 0 points Count backwards from 20 to 1: 0 points Say the months of the year in reverse: 0 points Repeat the address phrase from earlier: 0 points 6 CIT Score: 0 points  Advance Directives (For Healthcare) Does Patient Have a Medical Advance Directive?: No Type of Advance Directive: Living will Copy of Living Will in Chart?: No - copy available, Physician notified Would patient like information on creating a medical advance directive?: No - Patient declined  Reviewed/Updated  Reviewed/Updated: Reviewed All (Medical, Surgical, Family, Medications, Allergies, Care Teams, Patient Goals); Medical History; Surgical History; Family History; Medications; Allergies; Care Teams; Patient Goals    Allergies (verified) Ms contin [morphine] and Zetia  [ezetimibe ]   Current Medications (verified) Outpatient Encounter Medications as of 02/02/2024  Medication Sig   acetaminophen  (TYLENOL ) 500 MG tablet Take 1,000 mg by mouth every 4 (four) hours as needed for moderate pain (pain score 4-6) or headache.   amiodarone  (PACERONE ) 100 MG tablet Take 1 tablet (100 mg total) by mouth daily.   Ascorbic Acid (VITAMIN C PO) Take 1 tablet by mouth daily.   atorvastatin  (LIPITOR) 80 MG tablet Take 1 tablet (80 mg total) by mouth daily.   celecoxib  (CELEBREX ) 200 MG capsule Take 1 capsule (200 mg total) by mouth 2 (two) times daily.   Cholecalciferol (VITAMIN D -3 PO) Take 1 tablet by mouth daily.   clopidogrel  (PLAVIX ) 75 MG tablet Take 1 tablet (75 mg total) by mouth daily.   glipiZIDE  (GLUCOTROL  XL) 10 MG 24 hr tablet Take 1 tablet (10 mg total) by mouth daily with breakfast.   glucose blood (ONETOUCH VERIO) test strip Test 1X per day and as needed  Dx 250.02   ibuprofen (ADVIL) 200 MG tablet Take 400 mg by mouth 2 (two) times daily as needed for headache or moderate pain (pain score 4-6).   Lancets (ONETOUCH ULTRASOFT) lancets Patient test 1X per day and prn  Dx 250.02   metoprolol  succinate (TOPROL -XL) 50 MG 24 hr tablet TAKE 1 TABLET BY MOUTH ONCE DAILY WITH OR IMMEDIATELY FOLLOWING A MEAL   omeprazole  (PRILOSEC) 40 MG capsule Take 1 capsule (40 mg total) by mouth daily after breakfast.   No facility-administered encounter medications on file as of 02/02/2024.    History: Past Medical History:  Diagnosis Date   Cataract    CKD (chronic kidney disease), stage III (HCC)    followed by pcp   Coronary artery disease cardiologist--- dr verlin   08/ 1999  s/p  cath w/ PTCA and stenting to RCA;   05/ 2000 inferior wall MI , 06-06-1998 s/p cabg x3;   Last heart cath 2005,  2 patent grafts (diagnol and RCA) and occluded LIMA--LAD graft with normal LAD nonobstructive disease;  last nuclear study 11/ 2014 no evidence ishcemia, ef 40%   Full dentures    Hiatal hernia    History of acute inferior wall MI 05/1998   s/p  cabg   Hypertension     Hypertensive retinopathy    Mixed hyperlipidemia    Nocturia more than twice per night    PAD (peripheral artery disease)    left common iliac artery stenosis per aorta ultrasound 03/ 2021 in epic and cardiology note   Prostate cancer [facility]) urologist--- dr watt   first dx 07/ 2015 in active survillance until bx 11-11-2019,  Stage T2a, Gleason 3+4, PSA 13.3   S/P CABG x 3 06/06/1998   LIMA--LAD, SVG to Diagonal, RIMA to RCA   S/P primary angioplasty with coronary stent 08/1997   stent to RCA   Type 2 diabetes mellitus (HCC)    followed by pcp  (03-06-2020 checks blood sugar dialy in am,  fasting sugar-- 110-130)   Past Surgical History:  Procedure Laterality Date   APPENDECTOMY  child   CARDIAC CATHETERIZATION  09/01/2000  @[facility]    patent RCA and Diagonal grafts, atretic LIMA, moderate nonobstructive proxLAD, normal lvsf   CARDIAC CATHETERIZATION  04-21-2003  @[facility]    LIMA--LAD graft occluded, native LAD with nonobstructive disease,  patent diagonal/ rca grafts   CATARACT EXTRACTION     CATARACT EXTRACTION W/ INTRAOCULAR LENS IMPLANT Right 12/2018   CORONARY ANGIOPLASTY WITH STENT PLACEMENT  08/1997  @[facility]    ptca w/ stenting to rca   CORONARY ARTERY  BYPASS GRAFT  06-06-1998  @[facility]     LIMA -- LAD, SVG -- Diagonal,  RIMA to RCA   CYSTOSCOPY N/A 03/09/2020   Procedure: CYSTOSCOPY FLEXIBLE;  Surgeon: Watt Rush, MD;  Location: [facility];  Service: Urology;  Laterality: N/A;  NO SEEDS FOUND IN BLADDER   EXCISION, MASS, UPPER EXTREMITY Right 06/04/2023   Procedure: EXCISION, MASS, UPPER EXTREMITY;  Surgeon: Kallie Manuelita BROCKS, MD;  Location: [facility];  Service: General;  Laterality: Right;  MINOR PROCEDURE ROOM   EYE SURGERY     INGUINAL HERNIA REPAIR Right 1974   LOOP RECORDER INSERTION N/A 08/30/2020   Procedure: LOOP RECORDER INSERTION;  Surgeon: Cindie Ole DASEN, MD;  Location: [facility];  Service: Cardiovascular;  Laterality: N/A;   PROSTATE BIOPSY  06/2017   x 4-5     RADIOACTIVE SEED IMPLANT N/A 03/09/2020   Procedure: RADIOACTIVE SEED IMPLANT/BRACHYTHERAPY IMPLANT;  Surgeon: Watt Rush, MD;  Location: [facility];  Service: Urology;  Laterality: N/A;   70  SEEDS IMPLANTED   SPACE OAR INSTILLATION N/A 03/09/2020   Procedure: SPACE OAR INSTILLATION;  Surgeon: Watt Rush, MD;  Location: [facility];  Service: Urology;  Laterality: N/A;   VENTRAL HERNIA REPAIR  09/ 2000 and recurrent repair 06/ 2001   Family History  Problem Relation Age of Onset   Lung cancer Mother    Dementia Father    Hyperlipidemia Father    Congestive Heart Failure Father    Post-traumatic stress disorder Son    Alcohol  abuse Maternal Grandfather    Cancer Maternal Grandfather 52       stomach cancer    Heart attack Neg Hx    Stroke Neg Hx    Breast cancer Neg Hx    Colon cancer Neg Hx    Prostate cancer Neg Hx    Pancreatic cancer Neg Hx    Social History   Occupational History   Occupation: part-time it trainer    Comment: retired   Tobacco Use   Smoking status: Former    Current packs/day: 0.00    Average packs/day: 4.0 packs/day for 45.0 years (180.0 ttl pk-yrs)    Types: Cigarettes    Start date: 01/14/1952    Quit date: 01/13/1997    Years since quitting: 27.0   Smokeless tobacco: Never   Tobacco comments:    reports he began smoking at age 19  Vaping Use   Vaping status: Never Used  Substance and Sexual Activity   Alcohol  use: No   Drug use: Never   Sexual activity: Not on file   Tobacco Counseling Counseling given: Yes Tobacco comments: reports he began smoking at age 26  SDOH Screenings   Food Insecurity: No Food Insecurity (02/02/2024)  Housing: Low Risk (02/02/2024)  Transportation Needs: No Transportation Needs (02/02/2024)  Utilities: Not At Risk (02/02/2024)  Alcohol  Screen: Low Risk (12/24/2022)  Depression (PHQ2-9): Low Risk (02/02/2024)  Financial Resource Strain: Low Risk (12/24/2022)  Physical Activity:  Sufficiently Active (02/02/2024)  Social Connections: Socially Integrated (02/02/2024)  Stress: No Stress Concern Present (02/02/2024)  Tobacco Use: Medium Risk (02/02/2024)  Health Literacy: Adequate Health Literacy (02/02/2024)   See flowsheets for full screening details  Depression Screen PHQ 2 & 9 Depression Scale- Over the past 2 weeks, how often have you been bothered by any of the following problems? Little interest or pleasure in doing things: 0 Feeling down, depressed, or hopeless (PHQ Adolescent also includes...irritable): 0 PHQ-2 Total Score: 0  Goals Addressed             This Visit's Progress    Prevent falls   On track    Stay active              Objective:    Today's Vitals   02/02/24 1120  BP: 128/72  Pulse: 76  Weight: 212 lb (96.2 kg)  Height: 5' 11 (1.803 m)   Body mass index is 29.57 kg/m.  Hearing/Vision screen No results found. Immunizations and Health Maintenance Health Maintenance  Topic Date Due   OPHTHALMOLOGY EXAM  05/31/2022   FOOT EXAM  08/03/2022   Diabetic kidney evaluation - Urine ACR  01/21/2024   COVID-19 Vaccine (3 - Moderna risk series) 02/03/2024 (Originally 11/25/2023)   HEMOGLOBIN A1C  07/17/2024   Diabetic kidney evaluation - eGFR measurement  01/17/2025   Medicare Annual Wellness (AWV)  02/01/2025   DTaP/Tdap/Td (3 - Td or Tdap) 05/06/2033   Pneumococcal Vaccine: 50+ Years  Completed   Influenza Vaccine  Completed   Hepatitis C Screening  Completed   Zoster Vaccines- Shingrix  Completed   Meningococcal B Vaccine  Aged Out        Assessment/Plan:  This is a routine wellness examination for Vashaun.  Patient Care Team: Gladis Mustard, FNP as PCP - General (Family Medicine) Verlin Lonni BIRCH, MD as PCP - Cardiology (Cardiology) Watt Rush, MD as Attending Physician (Urology) Ladora Ross Lacy Phebe, MD as Referring Physician (Optometry) Grayce Buddle, RN as Oncology Nurse Navigator Valdemar Rogue, MD as  Consulting Physician (Ophthalmology)  I have personally reviewed and noted the following in the patients chart:   Medical and social history Use of alcohol , tobacco or illicit drugs  Current medications and supplements including opioid prescriptions. Functional ability and status Nutritional status Physical activity Advanced directives List of other physicians Hospitalizations, surgeries, and ER visits in previous 12 months Vitals Screenings to include cognitive, depression, and falls Referrals and appointments  No orders of the defined types were placed in this encounter.  In addition, I have reviewed and discussed with patient certain preventive protocols, quality metrics, and best practice recommendations. A written personalized care plan for preventive services as well as general preventive health recommendations were provided to patient.   Ozie Ned, CMA   02/02/2024   Return in 1 year (on 02/01/2025).  After Visit Summary: (MyChart) Due to this being a telephonic visit, the after visit summary with patients personalized plan was offered to patient via MyChart   Nurse Notes: PCP F/U: PT ALREADY HAD AN EYE EXAM-12/25--WILL SEND REQUEST TO PROVIDER, PT IS DUE FOOT EXAM, WILL GET IT AT NEXT [facility] GUTTING "

## 2024-02-13 ENCOUNTER — Ambulatory Visit

## 2024-02-14 ENCOUNTER — Ambulatory Visit: Attending: Student in an Organized Health Care Education/Training Program

## 2024-02-14 DIAGNOSIS — I639 Cerebral infarction, unspecified: Secondary | ICD-10-CM

## 2024-02-16 LAB — CUP PACEART REMOTE DEVICE CHECK
Date Time Interrogation Session: 20260131230635
Implantable Pulse Generator Implant Date: 20220818

## 2024-02-19 NOTE — Progress Notes (Signed)
 Remote Loop Recorder Transmission

## 2024-02-25 ENCOUNTER — Ambulatory Visit: Admitting: Student in an Organized Health Care Education/Training Program

## 2024-03-16 ENCOUNTER — Ambulatory Visit

## 2024-04-16 ENCOUNTER — Ambulatory Visit

## 2024-05-17 ENCOUNTER — Ambulatory Visit

## 2024-06-17 ENCOUNTER — Ambulatory Visit

## 2024-07-18 ENCOUNTER — Ambulatory Visit

## 2024-07-19 ENCOUNTER — Ambulatory Visit: Admitting: Nurse Practitioner

## 2024-08-18 ENCOUNTER — Ambulatory Visit

## 2024-09-18 ENCOUNTER — Ambulatory Visit

## 2024-10-19 ENCOUNTER — Ambulatory Visit

## 2024-11-15 ENCOUNTER — Encounter (INDEPENDENT_AMBULATORY_CARE_PROVIDER_SITE_OTHER): Admitting: Ophthalmology

## 2024-11-19 ENCOUNTER — Ambulatory Visit

## 2024-12-20 ENCOUNTER — Ambulatory Visit

## 2025-01-20 ENCOUNTER — Ambulatory Visit

## 2025-02-02 ENCOUNTER — Ambulatory Visit
